# Patient Record
Sex: Male | Born: 1940 | Race: White | Hispanic: No | Smoking: Never smoker
Health system: Southern US, Community
[De-identification: ages and names within clinical notes are randomized; demographics above are authoritative.]

## PROBLEM LIST (undated history)

## (undated) DIAGNOSIS — L570 Actinic keratosis: Secondary | ICD-10-CM

## (undated) DIAGNOSIS — M1990 Unspecified osteoarthritis, unspecified site: Secondary | ICD-10-CM

## (undated) DIAGNOSIS — J301 Allergic rhinitis due to pollen: Secondary | ICD-10-CM

## (undated) DIAGNOSIS — H269 Unspecified cataract: Secondary | ICD-10-CM

## (undated) DIAGNOSIS — E079 Disorder of thyroid, unspecified: Secondary | ICD-10-CM

## (undated) DIAGNOSIS — E039 Hypothyroidism, unspecified: Secondary | ICD-10-CM

## (undated) DIAGNOSIS — E785 Hyperlipidemia, unspecified: Secondary | ICD-10-CM

## (undated) DIAGNOSIS — K219 Gastro-esophageal reflux disease without esophagitis: Secondary | ICD-10-CM

## (undated) DIAGNOSIS — T7840XA Allergy, unspecified, initial encounter: Secondary | ICD-10-CM

## (undated) DIAGNOSIS — L219 Seborrheic dermatitis, unspecified: Secondary | ICD-10-CM

## (undated) DIAGNOSIS — G709 Myoneural disorder, unspecified: Secondary | ICD-10-CM

## (undated) HISTORY — DX: Allergic rhinitis due to pollen: J30.1

## (undated) HISTORY — DX: Allergy, unspecified, initial encounter: T78.40XA

## (undated) HISTORY — DX: Unspecified osteoarthritis, unspecified site: M19.90

## (undated) HISTORY — DX: Seborrheic dermatitis, unspecified: L21.9

## (undated) HISTORY — DX: Gastro-esophageal reflux disease without esophagitis: K21.9

## (undated) HISTORY — DX: Hyperlipidemia, unspecified: E78.5

## (undated) HISTORY — DX: Actinic keratosis: L57.0

## (undated) HISTORY — PX: UPPER GI ENDOSCOPY: SHX6162

## (undated) HISTORY — PX: CHOLECYSTECTOMY: SHX55

## (undated) HISTORY — DX: Myoneural disorder, unspecified: G70.9

## (undated) HISTORY — PX: LUNG LOBECTOMY: SHX167

## (undated) HISTORY — PX: COLONOSCOPY: SHX174

## (undated) HISTORY — DX: Unspecified cataract: H26.9

## (undated) HISTORY — PX: EYE SURGERY: SHX253

## (undated) HISTORY — PX: KNEE SURGERY: SHX244

## (undated) HISTORY — DX: Disorder of thyroid, unspecified: E07.9

---
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
## 1966-09-01 HISTORY — PX: KNEE CARTILAGE SURGERY: SHX688

## 2007-09-02 DIAGNOSIS — C3490 Malignant neoplasm of unspecified part of unspecified bronchus or lung: Secondary | ICD-10-CM

## 2007-09-02 HISTORY — DX: Malignant neoplasm of unspecified part of unspecified bronchus or lung: C34.90

## 2009-10-23 LAB — HM COLONOSCOPY

## 2016-07-07 DIAGNOSIS — M898X9 Other specified disorders of bone, unspecified site: Secondary | ICD-10-CM | POA: Insufficient documentation

## 2016-09-30 DIAGNOSIS — E039 Hypothyroidism, unspecified: Secondary | ICD-10-CM | POA: Diagnosis not present

## 2016-09-30 DIAGNOSIS — Z125 Encounter for screening for malignant neoplasm of prostate: Secondary | ICD-10-CM | POA: Diagnosis not present

## 2016-09-30 DIAGNOSIS — E559 Vitamin D deficiency, unspecified: Secondary | ICD-10-CM | POA: Diagnosis not present

## 2016-09-30 DIAGNOSIS — E538 Deficiency of other specified B group vitamins: Secondary | ICD-10-CM | POA: Diagnosis not present

## 2016-10-07 DIAGNOSIS — E538 Deficiency of other specified B group vitamins: Secondary | ICD-10-CM | POA: Insufficient documentation

## 2016-10-07 DIAGNOSIS — N529 Male erectile dysfunction, unspecified: Secondary | ICD-10-CM | POA: Insufficient documentation

## 2016-10-07 DIAGNOSIS — M25562 Pain in left knee: Secondary | ICD-10-CM | POA: Insufficient documentation

## 2016-10-07 DIAGNOSIS — C3490 Malignant neoplasm of unspecified part of unspecified bronchus or lung: Secondary | ICD-10-CM | POA: Insufficient documentation

## 2016-10-08 DIAGNOSIS — N529 Male erectile dysfunction, unspecified: Secondary | ICD-10-CM | POA: Diagnosis not present

## 2016-10-08 DIAGNOSIS — H1010 Acute atopic conjunctivitis, unspecified eye: Secondary | ICD-10-CM | POA: Diagnosis not present

## 2016-10-08 DIAGNOSIS — K219 Gastro-esophageal reflux disease without esophagitis: Secondary | ICD-10-CM | POA: Diagnosis not present

## 2016-10-08 DIAGNOSIS — E559 Vitamin D deficiency, unspecified: Secondary | ICD-10-CM | POA: Diagnosis not present

## 2016-10-08 DIAGNOSIS — C3490 Malignant neoplasm of unspecified part of unspecified bronchus or lung: Secondary | ICD-10-CM | POA: Diagnosis not present

## 2016-10-08 DIAGNOSIS — E039 Hypothyroidism, unspecified: Secondary | ICD-10-CM | POA: Diagnosis not present

## 2016-10-08 DIAGNOSIS — H1013 Acute atopic conjunctivitis, bilateral: Secondary | ICD-10-CM | POA: Diagnosis not present

## 2016-10-08 DIAGNOSIS — R7989 Other specified abnormal findings of blood chemistry: Secondary | ICD-10-CM | POA: Diagnosis not present

## 2016-10-08 DIAGNOSIS — M25562 Pain in left knee: Secondary | ICD-10-CM | POA: Diagnosis not present

## 2016-10-08 DIAGNOSIS — Z0000 Encounter for general adult medical examination without abnormal findings: Secondary | ICD-10-CM | POA: Diagnosis not present

## 2016-10-08 DIAGNOSIS — R978 Other abnormal tumor markers: Secondary | ICD-10-CM | POA: Diagnosis not present

## 2016-11-07 DIAGNOSIS — G608 Other hereditary and idiopathic neuropathies: Secondary | ICD-10-CM | POA: Diagnosis not present

## 2016-11-07 DIAGNOSIS — R202 Paresthesia of skin: Secondary | ICD-10-CM | POA: Diagnosis not present

## 2016-12-29 DIAGNOSIS — M170 Bilateral primary osteoarthritis of knee: Secondary | ICD-10-CM | POA: Diagnosis not present

## 2016-12-31 DIAGNOSIS — M170 Bilateral primary osteoarthritis of knee: Secondary | ICD-10-CM | POA: Diagnosis not present

## 2017-01-17 DIAGNOSIS — H109 Unspecified conjunctivitis: Secondary | ICD-10-CM | POA: Diagnosis not present

## 2017-01-19 DIAGNOSIS — H25041 Posterior subcapsular polar age-related cataract, right eye: Secondary | ICD-10-CM | POA: Diagnosis not present

## 2017-01-19 DIAGNOSIS — H25013 Cortical age-related cataract, bilateral: Secondary | ICD-10-CM | POA: Diagnosis not present

## 2017-01-19 DIAGNOSIS — B303 Acute epidemic hemorrhagic conjunctivitis (enteroviral): Secondary | ICD-10-CM | POA: Diagnosis not present

## 2017-01-19 DIAGNOSIS — H2513 Age-related nuclear cataract, bilateral: Secondary | ICD-10-CM | POA: Diagnosis not present

## 2017-01-19 DIAGNOSIS — M170 Bilateral primary osteoarthritis of knee: Secondary | ICD-10-CM | POA: Diagnosis not present

## 2017-01-21 DIAGNOSIS — M170 Bilateral primary osteoarthritis of knee: Secondary | ICD-10-CM | POA: Diagnosis not present

## 2017-01-23 DIAGNOSIS — M170 Bilateral primary osteoarthritis of knee: Secondary | ICD-10-CM | POA: Diagnosis not present

## 2017-01-28 DIAGNOSIS — M170 Bilateral primary osteoarthritis of knee: Secondary | ICD-10-CM | POA: Diagnosis not present

## 2017-01-30 DIAGNOSIS — M170 Bilateral primary osteoarthritis of knee: Secondary | ICD-10-CM | POA: Diagnosis not present

## 2017-02-03 DIAGNOSIS — M170 Bilateral primary osteoarthritis of knee: Secondary | ICD-10-CM | POA: Diagnosis not present

## 2017-03-19 DIAGNOSIS — D2339 Other benign neoplasm of skin of other parts of face: Secondary | ICD-10-CM | POA: Diagnosis not present

## 2017-03-19 DIAGNOSIS — D1801 Hemangioma of skin and subcutaneous tissue: Secondary | ICD-10-CM | POA: Diagnosis not present

## 2017-03-19 DIAGNOSIS — L820 Inflamed seborrheic keratosis: Secondary | ICD-10-CM | POA: Diagnosis not present

## 2017-03-19 DIAGNOSIS — L570 Actinic keratosis: Secondary | ICD-10-CM | POA: Diagnosis not present

## 2017-03-19 DIAGNOSIS — L853 Xerosis cutis: Secondary | ICD-10-CM | POA: Diagnosis not present

## 2017-03-19 DIAGNOSIS — L821 Other seborrheic keratosis: Secondary | ICD-10-CM | POA: Diagnosis not present

## 2017-04-02 DIAGNOSIS — D2339 Other benign neoplasm of skin of other parts of face: Secondary | ICD-10-CM | POA: Diagnosis not present

## 2017-04-02 DIAGNOSIS — L820 Inflamed seborrheic keratosis: Secondary | ICD-10-CM | POA: Diagnosis not present

## 2017-04-02 DIAGNOSIS — D1801 Hemangioma of skin and subcutaneous tissue: Secondary | ICD-10-CM | POA: Diagnosis not present

## 2017-04-02 DIAGNOSIS — L821 Other seborrheic keratosis: Secondary | ICD-10-CM | POA: Diagnosis not present

## 2017-04-17 DIAGNOSIS — M7742 Metatarsalgia, left foot: Secondary | ICD-10-CM | POA: Diagnosis not present

## 2017-04-17 DIAGNOSIS — M79672 Pain in left foot: Secondary | ICD-10-CM | POA: Diagnosis not present

## 2017-04-17 DIAGNOSIS — M7752 Other enthesopathy of left foot: Secondary | ICD-10-CM | POA: Diagnosis not present

## 2017-04-17 DIAGNOSIS — R262 Difficulty in walking, not elsewhere classified: Secondary | ICD-10-CM | POA: Diagnosis not present

## 2017-05-19 DIAGNOSIS — L821 Other seborrheic keratosis: Secondary | ICD-10-CM | POA: Diagnosis not present

## 2017-05-19 DIAGNOSIS — L237 Allergic contact dermatitis due to plants, except food: Secondary | ICD-10-CM | POA: Diagnosis not present

## 2017-05-19 DIAGNOSIS — L570 Actinic keratosis: Secondary | ICD-10-CM | POA: Diagnosis not present

## 2017-05-19 DIAGNOSIS — D1801 Hemangioma of skin and subcutaneous tissue: Secondary | ICD-10-CM | POA: Diagnosis not present

## 2017-05-21 DIAGNOSIS — Z23 Encounter for immunization: Secondary | ICD-10-CM | POA: Diagnosis not present

## 2017-06-16 DIAGNOSIS — M79671 Pain in right foot: Secondary | ICD-10-CM | POA: Diagnosis not present

## 2017-06-16 DIAGNOSIS — M7741 Metatarsalgia, right foot: Secondary | ICD-10-CM | POA: Diagnosis not present

## 2017-06-29 DIAGNOSIS — R05 Cough: Secondary | ICD-10-CM | POA: Diagnosis not present

## 2017-06-30 DIAGNOSIS — J209 Acute bronchitis, unspecified: Secondary | ICD-10-CM | POA: Diagnosis not present

## 2017-07-20 DIAGNOSIS — H25013 Cortical age-related cataract, bilateral: Secondary | ICD-10-CM | POA: Diagnosis not present

## 2017-07-20 DIAGNOSIS — H25041 Posterior subcapsular polar age-related cataract, right eye: Secondary | ICD-10-CM | POA: Diagnosis not present

## 2017-07-20 DIAGNOSIS — H10013 Acute follicular conjunctivitis, bilateral: Secondary | ICD-10-CM | POA: Diagnosis not present

## 2017-07-20 DIAGNOSIS — H2513 Age-related nuclear cataract, bilateral: Secondary | ICD-10-CM | POA: Diagnosis not present

## 2017-08-17 DIAGNOSIS — H33322 Round hole, left eye: Secondary | ICD-10-CM | POA: Diagnosis not present

## 2017-08-17 DIAGNOSIS — H10013 Acute follicular conjunctivitis, bilateral: Secondary | ICD-10-CM | POA: Diagnosis not present

## 2017-08-17 DIAGNOSIS — H2513 Age-related nuclear cataract, bilateral: Secondary | ICD-10-CM | POA: Diagnosis not present

## 2017-08-17 DIAGNOSIS — H25013 Cortical age-related cataract, bilateral: Secondary | ICD-10-CM | POA: Diagnosis not present

## 2017-09-04 DIAGNOSIS — J069 Acute upper respiratory infection, unspecified: Secondary | ICD-10-CM | POA: Diagnosis not present

## 2017-09-12 DIAGNOSIS — R05 Cough: Secondary | ICD-10-CM | POA: Diagnosis not present

## 2017-09-27 DIAGNOSIS — R0609 Other forms of dyspnea: Secondary | ICD-10-CM | POA: Diagnosis not present

## 2017-09-27 DIAGNOSIS — J209 Acute bronchitis, unspecified: Secondary | ICD-10-CM | POA: Diagnosis not present

## 2017-10-02 DIAGNOSIS — E559 Vitamin D deficiency, unspecified: Secondary | ICD-10-CM | POA: Diagnosis not present

## 2017-10-02 DIAGNOSIS — R7989 Other specified abnormal findings of blood chemistry: Secondary | ICD-10-CM | POA: Diagnosis not present

## 2017-10-02 DIAGNOSIS — E039 Hypothyroidism, unspecified: Secondary | ICD-10-CM | POA: Diagnosis not present

## 2017-10-02 DIAGNOSIS — R972 Elevated prostate specific antigen [PSA]: Secondary | ICD-10-CM | POA: Diagnosis not present

## 2017-10-09 DIAGNOSIS — Z79899 Other long term (current) drug therapy: Secondary | ICD-10-CM | POA: Diagnosis not present

## 2017-10-09 DIAGNOSIS — K219 Gastro-esophageal reflux disease without esophagitis: Secondary | ICD-10-CM | POA: Diagnosis not present

## 2017-10-09 DIAGNOSIS — Z1211 Encounter for screening for malignant neoplasm of colon: Secondary | ICD-10-CM | POA: Diagnosis not present

## 2017-10-09 DIAGNOSIS — Z85118 Personal history of other malignant neoplasm of bronchus and lung: Secondary | ICD-10-CM | POA: Diagnosis not present

## 2017-10-09 DIAGNOSIS — H5213 Myopia, bilateral: Secondary | ICD-10-CM | POA: Diagnosis not present

## 2017-10-09 DIAGNOSIS — E039 Hypothyroidism, unspecified: Secondary | ICD-10-CM | POA: Diagnosis not present

## 2017-10-09 DIAGNOSIS — N529 Male erectile dysfunction, unspecified: Secondary | ICD-10-CM | POA: Diagnosis not present

## 2017-10-09 DIAGNOSIS — R7989 Other specified abnormal findings of blood chemistry: Secondary | ICD-10-CM | POA: Diagnosis not present

## 2017-10-09 DIAGNOSIS — C3490 Malignant neoplasm of unspecified part of unspecified bronchus or lung: Secondary | ICD-10-CM | POA: Diagnosis not present

## 2017-10-09 DIAGNOSIS — R978 Other abnormal tumor markers: Secondary | ICD-10-CM | POA: Diagnosis not present

## 2017-10-09 DIAGNOSIS — E559 Vitamin D deficiency, unspecified: Secondary | ICD-10-CM | POA: Diagnosis not present

## 2017-10-12 DIAGNOSIS — R102 Pelvic and perineal pain: Secondary | ICD-10-CM | POA: Diagnosis not present

## 2017-10-26 DIAGNOSIS — R918 Other nonspecific abnormal finding of lung field: Secondary | ICD-10-CM | POA: Diagnosis not present

## 2017-10-26 DIAGNOSIS — R05 Cough: Secondary | ICD-10-CM | POA: Diagnosis not present

## 2017-10-28 DIAGNOSIS — Z902 Acquired absence of lung [part of]: Secondary | ICD-10-CM | POA: Diagnosis not present

## 2017-10-28 DIAGNOSIS — J984 Other disorders of lung: Secondary | ICD-10-CM | POA: Diagnosis not present

## 2017-10-28 DIAGNOSIS — R918 Other nonspecific abnormal finding of lung field: Secondary | ICD-10-CM | POA: Diagnosis not present

## 2017-10-28 DIAGNOSIS — K7689 Other specified diseases of liver: Secondary | ICD-10-CM | POA: Diagnosis not present

## 2017-10-28 DIAGNOSIS — Z85118 Personal history of other malignant neoplasm of bronchus and lung: Secondary | ICD-10-CM | POA: Diagnosis not present

## 2017-10-30 DIAGNOSIS — J189 Pneumonia, unspecified organism: Secondary | ICD-10-CM | POA: Diagnosis not present

## 2017-11-11 DIAGNOSIS — J189 Pneumonia, unspecified organism: Secondary | ICD-10-CM | POA: Diagnosis not present

## 2017-11-19 DIAGNOSIS — D485 Neoplasm of uncertain behavior of skin: Secondary | ICD-10-CM | POA: Diagnosis not present

## 2017-11-19 DIAGNOSIS — L570 Actinic keratosis: Secondary | ICD-10-CM | POA: Diagnosis not present

## 2017-11-19 DIAGNOSIS — D487 Neoplasm of uncertain behavior of other specified sites: Secondary | ICD-10-CM | POA: Diagnosis not present

## 2017-11-19 DIAGNOSIS — L820 Inflamed seborrheic keratosis: Secondary | ICD-10-CM | POA: Diagnosis not present

## 2017-12-28 DIAGNOSIS — Z902 Acquired absence of lung [part of]: Secondary | ICD-10-CM | POA: Diagnosis not present

## 2017-12-28 DIAGNOSIS — J189 Pneumonia, unspecified organism: Secondary | ICD-10-CM | POA: Diagnosis not present

## 2017-12-28 DIAGNOSIS — R918 Other nonspecific abnormal finding of lung field: Secondary | ICD-10-CM | POA: Diagnosis not present

## 2018-01-01 DIAGNOSIS — S80862A Insect bite (nonvenomous), left lower leg, initial encounter: Secondary | ICD-10-CM | POA: Diagnosis not present

## 2018-01-01 DIAGNOSIS — L570 Actinic keratosis: Secondary | ICD-10-CM | POA: Diagnosis not present

## 2018-03-03 DIAGNOSIS — Z902 Acquired absence of lung [part of]: Secondary | ICD-10-CM | POA: Diagnosis not present

## 2018-03-03 DIAGNOSIS — Z79899 Other long term (current) drug therapy: Secondary | ICD-10-CM | POA: Diagnosis not present

## 2018-03-03 DIAGNOSIS — Z5181 Encounter for therapeutic drug level monitoring: Secondary | ICD-10-CM | POA: Diagnosis not present

## 2018-03-03 DIAGNOSIS — Z8709 Personal history of other diseases of the respiratory system: Secondary | ICD-10-CM | POA: Diagnosis not present

## 2018-03-03 DIAGNOSIS — R9389 Abnormal findings on diagnostic imaging of other specified body structures: Secondary | ICD-10-CM | POA: Diagnosis not present

## 2018-03-03 DIAGNOSIS — J189 Pneumonia, unspecified organism: Secondary | ICD-10-CM | POA: Diagnosis not present

## 2018-03-11 DIAGNOSIS — G608 Other hereditary and idiopathic neuropathies: Secondary | ICD-10-CM | POA: Diagnosis not present

## 2018-03-12 DIAGNOSIS — B351 Tinea unguium: Secondary | ICD-10-CM | POA: Diagnosis not present

## 2018-03-12 DIAGNOSIS — M79671 Pain in right foot: Secondary | ICD-10-CM | POA: Diagnosis not present

## 2018-03-12 DIAGNOSIS — M79672 Pain in left foot: Secondary | ICD-10-CM | POA: Diagnosis not present

## 2018-03-30 DIAGNOSIS — S30860A Insect bite (nonvenomous) of lower back and pelvis, initial encounter: Secondary | ICD-10-CM | POA: Diagnosis not present

## 2018-03-30 DIAGNOSIS — B351 Tinea unguium: Secondary | ICD-10-CM | POA: Diagnosis not present

## 2018-03-30 DIAGNOSIS — L853 Xerosis cutis: Secondary | ICD-10-CM | POA: Diagnosis not present

## 2018-03-30 DIAGNOSIS — L821 Other seborrheic keratosis: Secondary | ICD-10-CM | POA: Diagnosis not present

## 2018-03-30 DIAGNOSIS — D1801 Hemangioma of skin and subcutaneous tissue: Secondary | ICD-10-CM | POA: Diagnosis not present

## 2018-03-30 DIAGNOSIS — L820 Inflamed seborrheic keratosis: Secondary | ICD-10-CM | POA: Diagnosis not present

## 2018-04-23 DIAGNOSIS — Z23 Encounter for immunization: Secondary | ICD-10-CM | POA: Diagnosis not present

## 2018-04-26 DIAGNOSIS — B351 Tinea unguium: Secondary | ICD-10-CM | POA: Diagnosis not present

## 2018-04-26 DIAGNOSIS — M79672 Pain in left foot: Secondary | ICD-10-CM | POA: Diagnosis not present

## 2018-04-26 DIAGNOSIS — M25872 Other specified joint disorders, left ankle and foot: Secondary | ICD-10-CM | POA: Diagnosis not present

## 2018-05-17 ENCOUNTER — Telehealth: Payer: Self-pay | Admitting: Family Medicine

## 2018-05-17 ENCOUNTER — Encounter: Payer: Self-pay | Admitting: Family Medicine

## 2018-05-17 ENCOUNTER — Ambulatory Visit (INDEPENDENT_AMBULATORY_CARE_PROVIDER_SITE_OTHER): Payer: Medicare Other | Admitting: Family Medicine

## 2018-05-17 VITALS — BP 98/76 | HR 68 | Temp 99.1°F | Ht 70.5 in | Wt 177.0 lb

## 2018-05-17 DIAGNOSIS — E039 Hypothyroidism, unspecified: Secondary | ICD-10-CM | POA: Diagnosis not present

## 2018-05-17 DIAGNOSIS — M545 Low back pain: Secondary | ICD-10-CM | POA: Diagnosis not present

## 2018-05-17 DIAGNOSIS — Z902 Acquired absence of lung [part of]: Secondary | ICD-10-CM | POA: Diagnosis not present

## 2018-05-17 DIAGNOSIS — J302 Other seasonal allergic rhinitis: Secondary | ICD-10-CM

## 2018-05-17 DIAGNOSIS — G8929 Other chronic pain: Secondary | ICD-10-CM | POA: Diagnosis not present

## 2018-05-17 DIAGNOSIS — K219 Gastro-esophageal reflux disease without esophagitis: Secondary | ICD-10-CM

## 2018-05-17 NOTE — Telephone Encounter (Signed)
Pt would like to have fasting labs prior to Physical appt. Order is needed please and thank you!  Pt want to know if he's medicare eligible for a PSA test and if so does he need to have it done? Please advise?  Call pt @ [phone].

## 2018-05-17 NOTE — Progress Notes (Signed)
Subjective:    Patient ID: Kevin Terrell, male    DOB: July 24, 1941, 77 y.o.   MRN: [id number]  HPI  Presents to clinic to establish primary care with new PCP, he recently moved to area from [facility] with his wife.  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  Patient has hypothyroidism, stable on levothyroxine dose.  Patient has GERD, stable on omeprazole dose.  Patient has chronic seasonal allergies and takes cetirizine, montelukast daily and also uses Flonase as needed.  Patient has a history of lung cancer, had right upper lobectomy in 2009.  He followed with oncology afterwards for 6+ years and has been dismissed from oncology practice due to no more issues.  He is the main caregiver for his wife who suffers from Alzheimer's dementia.  Patient's past medical history, surgical history, social history, family history reviewed and updated in chart.  Patient last had lab work approximately 6 months ago, states he usually only gets lab work once per year.  Flu vaccine is up-to-date for 2019-2020 season  Patient Active Problem List   Diagnosis Date Noted  . Seasonal allergies 05/18/2018  . Hypothyroidism 05/18/2018  . Gastroesophageal reflux disease without esophagitis 05/18/2018  . 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 Chronic midline low back pain 05/18/2018   Social History   Tobacco Use  . Smoking status: Never Smoker  . Smokeless tobacco: Never Used  Substance Use Topics  . Alcohol use: Yes    Alcohol/week: 1.0 standard drinks    Types: 1 Glasses of wine per week    Comment: per once weekly    Family History  Problem Relation Age of Onset  . Cancer Mother   . Stroke Maternal Grandmother    Past Surgical History:  Procedure Laterality Date  . LUNG LOBECTOMY     upper right lung 12/15/2007    Review of Systems  Constitutional: Negative for chills, fatigue and fever.  HENT: Negative for congestion, ear  pain, sinus pain and sore throat.   Eyes: Negative.   Respiratory: Negative for cough, shortness of breath and wheezing.   Cardiovascular: Negative for chest pain, palpitations and leg swelling.  Gastrointestinal: Negative for abdominal pain, diarrhea, nausea and vomiting.  Genitourinary: Negative for dysuria, frequency and urgency.  Musculoskeletal: Negative for arthralgias and myalgias.  Skin: Negative for color change, pallor and rash.  Neurological: Negative for syncope, light-headedness and headaches.  Psychiatric/Behavioral: The patient is not nervous/anxious.       Objective:   Physical Exam  Constitutional: He is oriented to person, place, and time. He appears well-developed and well-nourished. No distress.   Head: Normocephalic and atraumatic.  Eyes: Pupils are equal, round, and reactive to light. Conjunctivae and EOM are normal. No scleral icterus.  Neck: Normal range of motion. Neck supple. No tracheal deviation present.  Cardiovascular: Normal rate, regular rhythm and normal heart sounds.  Pulmonary/Chest: Effort normal and breath sounds normal. No respiratory distress. He has no wheezes. He has no rales.  Neurological: He is alert and oriented to person, place, and time.  Gait normal  Skin: Skin is warm and dry. He is not diaphoretic. No pallor.  Psychiatric: He has a normal mood and affect. His behavior is normal. Thought content normal.   Nursing note and vitals reviewed.     Vitals:   05/17/18 1538  BP: 98/76  Pulse: 68  Temp: 99.1 F (37.3 C)  SpO2: 98%   Assessment &  Plan:   GERD-stable on current medications  Hypothyroid-stable on current dose  Seasonal allergies-stable on cetirizine and Singulair, uses Flonase as needed.  Chronic back pain -- Stable on gabapentin dose   Patient declines new lab work today, states he usually gets it was here or if he is having symptoms of illness.  Patient will return to clinic in approximately 4 months for annual  Medicare wellness and office visit for complete physical exam including lab work.  Patient advised he can return to clinic sooner if any issues or illness arise.

## 2018-05-18 ENCOUNTER — Encounter: Payer: Self-pay | Admitting: Family Medicine

## 2018-05-18 DIAGNOSIS — M545 Low back pain, unspecified: Secondary | ICD-10-CM | POA: Insufficient documentation

## 2018-05-18 DIAGNOSIS — K219 Gastro-esophageal reflux disease without esophagitis: Secondary | ICD-10-CM | POA: Insufficient documentation

## 2018-05-18 DIAGNOSIS — G8929 Other chronic pain: Secondary | ICD-10-CM | POA: Insufficient documentation

## 2018-05-18 DIAGNOSIS — J302 Other seasonal allergic rhinitis: Secondary | ICD-10-CM | POA: Insufficient documentation

## 2018-05-18 DIAGNOSIS — Z902 Acquired absence of lung [part of]: Secondary | ICD-10-CM | POA: Insufficient documentation

## 2018-05-18 DIAGNOSIS — E039 Hypothyroidism, unspecified: Secondary | ICD-10-CM | POA: Insufficient documentation

## 2018-05-18 NOTE — Telephone Encounter (Signed)
Sent to PCP to advise if OK with pt doing lab work prior to Physical appt.   If OK please place orders or advise me what orders you'd like to have done along with Dx code.   Thanks

## 2018-05-18 NOTE — Telephone Encounter (Signed)
Lab orders in   Please schedule lab appt about 1 week prior to CPE appt

## 2018-05-19 NOTE — Telephone Encounter (Signed)
Called pt to let him know about lab work that needs to be done at least 1 wk before visit. Pt will be calling back in January to schedule a lab appt. Pt also stated he found some old saline washes at home, he wanted to know if it was okay to keep and use if he needed to do a nasal rinse from time to time if he's congested or having sinus issues, could he do sinses and it would be okay

## 2018-05-19 NOTE — Telephone Encounter (Signed)
Yes saline washes are ok - just double check the expiration date on saline bottles prior to use  LG

## 2018-05-21 ENCOUNTER — Telehealth: Payer: Self-pay

## 2018-05-21 DIAGNOSIS — Z1283 Encounter for screening for malignant neoplasm of skin: Secondary | ICD-10-CM

## 2018-05-21 DIAGNOSIS — L602 Onychogryphosis: Secondary | ICD-10-CM

## 2018-05-21 NOTE — Addendum Note (Signed)
Addended by: Philis Nettle on: 05/21/2018 03:39 PM   Modules accepted: Orders

## 2018-05-21 NOTE — Telephone Encounter (Signed)
Copied from [place] [phone]. Topic: Referral - Request >> May 21, 2018  8:46 AM Hewitt Shorts wrote: Pt is requesting that Philis Nettle get him a referral to a clinical dermatologist and a podiatrist   Best number to call is [phone]

## 2018-05-26 ENCOUNTER — Telehealth: Payer: Self-pay | Admitting: Family Medicine

## 2018-05-26 NOTE — Telephone Encounter (Signed)
Pt dropped off a handicapp form to be completed. Form is up front in Lauren's color folder.

## 2018-05-28 ENCOUNTER — Telehealth: Payer: Self-pay

## 2018-05-28 NOTE — Telephone Encounter (Signed)
Paper work has been placed at the front desk. Voicemail was left for patient that paper work is completed and ready to be picked up.

## 2018-06-09 ENCOUNTER — Telehealth: Payer: Self-pay | Admitting: Family Medicine

## 2018-06-09 DIAGNOSIS — K219 Gastro-esophageal reflux disease without esophagitis: Secondary | ICD-10-CM

## 2018-06-09 DIAGNOSIS — M1990 Unspecified osteoarthritis, unspecified site: Secondary | ICD-10-CM

## 2018-06-09 DIAGNOSIS — Z902 Acquired absence of lung [part of]: Secondary | ICD-10-CM

## 2018-06-09 DIAGNOSIS — E039 Hypothyroidism, unspecified: Secondary | ICD-10-CM

## 2018-06-09 NOTE — Telephone Encounter (Signed)
Copied from [place] [id number] [id number]. Topic: Quick Communication - Rx Refill/Question >> Jun 09, 2018  4:58 PM Sheran Luz wrote: Medication: omeprazole (PRILOSEC) 40 MG capsule [[id number]], levothyroxine (SYNTHROID, LEVOTHROID) 50 MCG tablet [[id number]], montelukast (SINGULAIR) 10 MG tablet [[id number]]  and Meloxicam 15 mg.    Pt states he spoke with Philis Nettle on 9/16 (last Ov) about refilling any medications that he had previously been prescribed by provider in [place]. Pt states that he did not think to mention a couple of medications that he is needing refilled. Pt would like to know if she would initiate refills on the following medications, if at all possible.     Preferred Pharmacy (with phone number or street name): [id number] - [address], [place]  [phone] (Phone) [phone] (Fax)

## 2018-06-09 NOTE — Telephone Encounter (Signed)
Medication: omeprazole (PRILOSEC) 40 MG capsule [[id number]], levothyroxine (SYNTHROID, LEVOTHROID) 50 MCG tablet [[id number]], montelukast (SINGULAIR) 10 MG tablet [[id number]]  and Meloxicam 15 mg.    Pt states he spoke with Philis Nettle on 9/16 (last Ov) about refilling any medications that he had previously been prescribed by provider in [place]. Pt states that he did not think to mention a couple of medications that he is needing refilled. Pt would like to know if she would initiate refills on the following medications, if at all possible.     [id number] - [address], [place]   [phone] (Phone) [phone] (Fax)

## 2018-06-10 MED ORDER — LEVOTHYROXINE SODIUM 50 MCG PO TABS: 50.0000 ug | ORAL_TABLET | Freq: Every day | ORAL | 3 refills | Status: DC

## 2018-06-10 MED ORDER — MONTELUKAST SODIUM 10 MG PO TABS: 10.0000 mg | ORAL_TABLET | Freq: Every day | ORAL | 3 refills | Status: DC

## 2018-06-10 MED ORDER — MELOXICAM 15 MG PO TABS: 15.0000 mg | ORAL_TABLET | Freq: Every day | ORAL | 1 refills | Status: DC

## 2018-06-10 MED ORDER — OMEPRAZOLE 40 MG PO CPDR: 40.0000 mg | DELAYED_RELEASE_CAPSULE | Freq: Every day | ORAL | 3 refills | Status: DC

## 2018-06-10 NOTE — Telephone Encounter (Signed)
[facility] nurse spoke to Pt. Pt states he spoke with Philis Nettle on 9/16 (last Ov) about refilling any medications that he had previously been prescribed by provider in [place]. Pt states that he did not think to mention a couple of medications that he is needing refilled. Pt would like to know if she would initiate refills on the following medications, if at all possible.   Medication: omeprazole (PRILOSEC) 40 MG capsule [[id number]], levothyroxine (SYNTHROID, LEVOTHROID) 50 MCG tablet [[id number]], montelukast (SINGULAIR) 10 MG tablet [[id number]] and Meloxicam 15 mg.  [id number] - [address], [place] - [address] [phone] (Phone) [phone] (Fax)

## 2018-06-10 NOTE — Telephone Encounter (Signed)
Rx sent 

## 2018-06-10 NOTE — Telephone Encounter (Signed)
rx request 

## 2018-06-10 NOTE — Telephone Encounter (Signed)
Called Pt to tell him Rx was sent to the pharmacy

## 2018-06-30 ENCOUNTER — Other Ambulatory Visit: Payer: Self-pay | Admitting: Podiatry

## 2018-06-30 ENCOUNTER — Ambulatory Visit (INDEPENDENT_AMBULATORY_CARE_PROVIDER_SITE_OTHER): Payer: Medicare Other

## 2018-06-30 ENCOUNTER — Encounter: Payer: Self-pay | Admitting: Podiatry

## 2018-06-30 ENCOUNTER — Ambulatory Visit (INDEPENDENT_AMBULATORY_CARE_PROVIDER_SITE_OTHER): Payer: Medicare Other | Admitting: Podiatry

## 2018-06-30 DIAGNOSIS — B351 Tinea unguium: Secondary | ICD-10-CM | POA: Diagnosis not present

## 2018-06-30 DIAGNOSIS — M79676 Pain in unspecified toe(s): Secondary | ICD-10-CM

## 2018-06-30 DIAGNOSIS — M722 Plantar fascial fibromatosis: Secondary | ICD-10-CM

## 2018-06-30 DIAGNOSIS — M2580 Other specified joint disorders, unspecified joint: Secondary | ICD-10-CM

## 2018-06-30 NOTE — Progress Notes (Signed)
Subjective:  Patient ID: Kevin Terrell, male    DOB: March 05, 1941,  MRN: [id number] HPI Chief Complaint  Patient presents with  . Foot Pain    1st MPJ and plantar heel left - patient states him and his wife just moved to [facility] 4 weeks ago from [facility] and would like to get established with a podiatrist-seen one regularly for nail trims and sesamoiditis in left - used to be an active runner, tries to wear good shoes  . New Patient (Initial Visit)    77 y.o. male presents with the above complaint.   ROS: Denies fever chills nausea vomiting muscle aches pains calf pain back pain chest pain shortness of breath.  Past Medical History:  Diagnosis Date  . Arthritis   . GERD (gastroesophageal reflux disease)   . Thyroid disease    hypothyroid    Past Surgical History:  Procedure Laterality Date  . LUNG LOBECTOMY     upper right lung 12/15/2007     Current Outpatient Medications:  .  albuterol (VENTOLIN HFA) 108 (90 Base) MCG/ACT inhaler, Ventolin HFA 90 mcg/actuation aerosol inhaler, Disp: , Rfl:  .  cetirizine (ZYRTEC) 10 MG tablet, Take 10 mg by mouth daily., Disp: , Rfl:  .  fluticasone (VERAMYST) 27.5 MCG/SPRAY nasal spray, Place 2 sprays into the nose daily., Disp: , Rfl:  .  gabapentin (NEURONTIN) 300 MG capsule, Take 300 mg by mouth 2 (two) times daily., Disp: , Rfl:  .  levothyroxine (SYNTHROID, LEVOTHROID) 50 MCG tablet, Take 1 tablet (50 mcg total) by mouth daily before breakfast., Disp: 90 tablet, Rfl: 3 .  loperamide (IMODIUM) 2 MG capsule, loperamide 2 mg capsule, Disp: , Rfl:  .  meloxicam (MOBIC) 15 MG tablet, Take 1 tablet (15 mg total) by mouth daily., Disp: 90 tablet, Rfl: 1 .  montelukast (SINGULAIR) 10 MG tablet, Take 1 tablet (10 mg total) by mouth at bedtime., Disp: 90 tablet, Rfl: 3 .  Multiple Vitamin (MULTIVITAMIN WITH MINERALS) TABS tablet, Take 1 tablet by mouth daily., Disp: , Rfl:  .  nystatin cream (MYCOSTATIN), nystatin 100,000 unit/gram topical cream,  Disp: , Rfl:  .  Olopatadine HCl 0.2 % SOLN, olopatadine 0.2 % eye drops, Disp: , Rfl:  .  omeprazole (PRILOSEC) 40 MG capsule, Take 1 capsule (40 mg total) by mouth daily., Disp: 90 capsule, Rfl: 3 .  Probiotic Product (PROBIOTIC-10) CAPS, Take by mouth., Disp: , Rfl:  .  triamcinolone (KENALOG) 0.025 % ointment, triamcinolone acetonide 0.025 % topical ointment, Disp: , Rfl:  .  trimethoprim-polymyxin b (POLYTRIM) ophthalmic solution, polymyxin B sulfate 10,000 unit-trimethoprim 1 mg/mL eye drops, Disp: , Rfl:  .  Vitamin D, Ergocalciferol, (DRISDOL) 50000 units CAPS capsule, Take 50,000 Units by mouth every 7 (seven) days., Disp: , Rfl:   Allergies  Allergen Reactions  . Compazine [Prochlorperazine Edisylate]     muscle spasmus   . Other     Okra - hives   Review of Systems Objective:  There were no vitals filed for this visit.  General: Well developed, nourished, in no acute distress, alert and oriented x3   Dermatological: Skin is warm, dry and supple bilateral. Nails x 10 are well maintained; remaining integument appears unremarkable at this time. There are no open sores, no preulcerative lesions, no rash or signs of infection present.  Toenails are long thick yellow dystrophic and clinically mycotic  Vascular: Dorsalis Pedis artery and Posterior Tibial artery pedal pulses are 2/4 bilateral with immedate capillary fill time. Pedal  hair growth present. No varicosities and no lower extremity edema present bilateral.   Neruologic: Grossly intact via light touch bilateral. Vibratory intact via tuning fork bilateral. Protective threshold with Semmes Wienstein monofilament intact to all pedal sites bilateral. Patellar and Achilles deep tendon reflexes 2+ bilateral. No Babinski or clonus noted bilateral.   Musculoskeletal: No gross boney pedal deformities bilateral. No pain, crepitus, or limitation noted with foot and ankle range of motion bilateral. Muscular strength 5/5 in all groups  tested bilateral.  Pain on palpation with what appears to be a bursa of the tibial sesamoid left foot.  Also has mild plantar fasciitis  Gait: Unassisted, Nonantalgic.    Radiographs:  Radiographs demonstrate today no major osseous abnormalities no acute findings.  Assessment & Plan:   Assessment: Sesamoiditis tibial sesamoid first metatarsophalangeal joint left mild plantar fasciitis bilateral and painful elongated toenails bilateral.  Pain in limb secondary to onychomycosis.  Plan: After sterile Betadine skin prep I injected 2 mg of dexamethasone local anesthetic to the tibial sesamoid and bursa that is present there left foot.  Also debrided toenails 1 through 5 bilateral.     Solymar Grace T. [place], [place]

## 2018-07-15 DIAGNOSIS — D1800 Hemangioma unspecified site: Secondary | ICD-10-CM | POA: Diagnosis not present

## 2018-07-15 DIAGNOSIS — L905 Scar conditions and fibrosis of skin: Secondary | ICD-10-CM | POA: Diagnosis not present

## 2018-07-15 DIAGNOSIS — L821 Other seborrheic keratosis: Secondary | ICD-10-CM | POA: Diagnosis not present

## 2018-07-15 DIAGNOSIS — D229 Melanocytic nevi, unspecified: Secondary | ICD-10-CM | POA: Diagnosis not present

## 2018-07-15 DIAGNOSIS — L820 Inflamed seborrheic keratosis: Secondary | ICD-10-CM | POA: Diagnosis not present

## 2018-07-15 DIAGNOSIS — L570 Actinic keratosis: Secondary | ICD-10-CM | POA: Diagnosis not present

## 2018-07-15 DIAGNOSIS — L814 Other melanin hyperpigmentation: Secondary | ICD-10-CM | POA: Diagnosis not present

## 2018-08-11 ENCOUNTER — Ambulatory Visit: Payer: Medicare Other | Admitting: Orthotics

## 2018-08-11 DIAGNOSIS — M2580 Other specified joint disorders, unspecified joint: Secondary | ICD-10-CM

## 2018-08-11 DIAGNOSIS — B351 Tinea unguium: Secondary | ICD-10-CM

## 2018-08-11 DIAGNOSIS — M79676 Pain in unspecified toe(s): Secondary | ICD-10-CM

## 2018-08-11 NOTE — Progress Notes (Signed)
Had good discussion with patient re footwear; he has decided to visit Melissa at [employer] to get her ideas on a shoe comparable to [place], which he has had good success in the past.  If he doesn't find anything there, he will return and get Apex [id number]

## 2018-08-17 DIAGNOSIS — Z79899 Other long term (current) drug therapy: Secondary | ICD-10-CM | POA: Diagnosis not present

## 2018-08-17 DIAGNOSIS — E559 Vitamin D deficiency, unspecified: Secondary | ICD-10-CM | POA: Diagnosis not present

## 2018-08-17 DIAGNOSIS — G629 Polyneuropathy, unspecified: Secondary | ICD-10-CM | POA: Diagnosis not present

## 2018-08-17 DIAGNOSIS — Z85118 Personal history of other malignant neoplasm of bronchus and lung: Secondary | ICD-10-CM | POA: Diagnosis not present

## 2018-08-22 DIAGNOSIS — Z85118 Personal history of other malignant neoplasm of bronchus and lung: Secondary | ICD-10-CM | POA: Insufficient documentation

## 2018-08-22 DIAGNOSIS — G629 Polyneuropathy, unspecified: Secondary | ICD-10-CM

## 2018-08-22 DIAGNOSIS — G620 Drug-induced polyneuropathy: Secondary | ICD-10-CM | POA: Insufficient documentation

## 2018-09-06 ENCOUNTER — Telehealth: Payer: Self-pay

## 2018-09-06 NOTE — Telephone Encounter (Signed)
Called Pt and told him that him and his wife does not need any lab work and he has an appt here on 2/24. Pt stated he understood with no questions.

## 2018-09-06 NOTE — Telephone Encounter (Signed)
If labs were done in December, then he does not need to get them done now. Keep appt as planned in february

## 2018-09-06 NOTE — Telephone Encounter (Signed)
Copied from [place] [id number]. Topic: General - Other >> Sep 06, 2018 11:43 AM Leward Quan A wrote: Reason for CRM: Patient called to say that he was seen by Dr Manuella Ghazi a Neurologist that did a full biochemical workup on him in December and would like to know if that can be sent. Stated that its ok if Ander Purpura Guse still want him to come in and do blood work at the lab he's ok with that too. Ph# [phone]

## 2018-09-09 ENCOUNTER — Other Ambulatory Visit: Payer: Self-pay | Admitting: Lab

## 2018-09-09 DIAGNOSIS — M1990 Unspecified osteoarthritis, unspecified site: Secondary | ICD-10-CM

## 2018-09-09 MED ORDER — MELOXICAM 15 MG PO TABS: 15.0000 mg | ORAL_TABLET | Freq: Every day | ORAL | 1 refills | Status: DC

## 2018-09-23 DIAGNOSIS — L309 Dermatitis, unspecified: Secondary | ICD-10-CM | POA: Diagnosis not present

## 2018-09-23 DIAGNOSIS — L570 Actinic keratosis: Secondary | ICD-10-CM | POA: Diagnosis not present

## 2018-09-27 ENCOUNTER — Ambulatory Visit: Payer: Medicare Other | Admitting: Podiatry

## 2018-09-29 ENCOUNTER — Telehealth: Payer: Self-pay | Admitting: Family Medicine

## 2018-09-29 ENCOUNTER — Encounter: Payer: Self-pay | Admitting: Podiatry

## 2018-09-29 ENCOUNTER — Ambulatory Visit (INDEPENDENT_AMBULATORY_CARE_PROVIDER_SITE_OTHER): Payer: Medicare Other | Admitting: Podiatry

## 2018-09-29 DIAGNOSIS — Z0000 Encounter for general adult medical examination without abnormal findings: Secondary | ICD-10-CM

## 2018-09-29 DIAGNOSIS — M25872 Other specified joint disorders, left ankle and foot: Secondary | ICD-10-CM

## 2018-09-29 DIAGNOSIS — B351 Tinea unguium: Secondary | ICD-10-CM

## 2018-09-29 DIAGNOSIS — Z125 Encounter for screening for malignant neoplasm of prostate: Secondary | ICD-10-CM

## 2018-09-29 DIAGNOSIS — E039 Hypothyroidism, unspecified: Secondary | ICD-10-CM

## 2018-09-29 DIAGNOSIS — M79676 Pain in unspecified toe(s): Secondary | ICD-10-CM | POA: Diagnosis not present

## 2018-09-29 DIAGNOSIS — M2580 Other specified joint disorders, unspecified joint: Secondary | ICD-10-CM

## 2018-09-29 NOTE — Progress Notes (Signed)
He presents today for routine nail debridement.  States that he may still have some sesamoiditis.  Objective: Vital signs are stable alert and oriented x3 has some mild tenderness on direct palpation of the proximal edge of the tibial sesamoid left.  Otherwise his toenails are long thick yellow dystrophic clinically mycotic.  Assessment: Sesamoiditis and pain in limb secondary to onychomycosis.  Plan: Injected triamcinolone with local anesthetic after sterile Betadine skin prep a total of 5 mg was injected plantar medial aspect of the first metatarsophalangeal joint.  And also debrided toenails 1 through 5 bilateral.

## 2018-09-29 NOTE — Telephone Encounter (Signed)
Pt would like to know if needs to have a PSA lab check? Pt states it was elevated within the last 2 years in the past. Last PSA 10/02/2017.  Pt would like to know if fasting lipid panel needs to be done? Pt had it done about 3 years ago in December.   Please advise? Thank you!

## 2018-09-30 NOTE — Telephone Encounter (Signed)
Fasting labs and PSA ordered  Please schedule lab appt for him

## 2018-09-30 NOTE — Telephone Encounter (Signed)
Called Pt and scheduled him a lab  appt for 10/04/2018 at 10:00am

## 2018-10-04 ENCOUNTER — Other Ambulatory Visit (INDEPENDENT_AMBULATORY_CARE_PROVIDER_SITE_OTHER): Payer: Medicare Other

## 2018-10-04 DIAGNOSIS — E039 Hypothyroidism, unspecified: Secondary | ICD-10-CM

## 2018-10-04 DIAGNOSIS — Z0000 Encounter for general adult medical examination without abnormal findings: Secondary | ICD-10-CM

## 2018-10-04 DIAGNOSIS — Z125 Encounter for screening for malignant neoplasm of prostate: Secondary | ICD-10-CM | POA: Diagnosis not present

## 2018-10-04 LAB — COMPREHENSIVE METABOLIC PANEL
ALT: 14 U/L (ref 0–53)
AST: 15 U/L (ref 0–37)
Albumin: 4.3 g/dL (ref 3.5–5.2)
Alkaline Phosphatase: 54 U/L (ref 39–117)
BUN: 24 mg/dL — ABNORMAL HIGH (ref 6–23)
CO2: 31 mEq/L (ref 19–32)
CREATININE: 0.75 mg/dL (ref 0.40–1.50)
Calcium: 9 mg/dL (ref 8.4–10.5)
Chloride: 101 mEq/L (ref 96–112)
GFR: 101.17 mL/min (ref >60.00)
Glucose, Bld: 92 mg/dL (ref 70–99)
Potassium: 4.6 mEq/L (ref 3.5–5.1)
Sodium: 136 mEq/L (ref 135–145)
Total Bilirubin: 0.3 mg/dL (ref 0.2–1.2)
Total Protein: 6.6 g/dL (ref 6.0–8.3)

## 2018-10-04 LAB — LIPID PANEL
Cholesterol: 175 mg/dL (ref 0–200)
HDL: 44.6 mg/dL (ref >39.00)
LDL Cholesterol: 103 mg/dL — ABNORMAL HIGH (ref 0–99)
NonHDL: 130.03
Total CHOL/HDL Ratio: 4
Triglycerides: 133 mg/dL (ref 0.0–149.0)
VLDL: 26.6 mg/dL (ref 0.0–40.0)

## 2018-10-04 LAB — CBC WITH DIFFERENTIAL/PLATELET
Basophils Absolute: 0 10*3/uL (ref 0.0–0.1)
Basophils Relative: 0.3 % (ref 0.0–3.0)
Eosinophils Absolute: 0.1 10*3/uL (ref 0.0–0.7)
Eosinophils Relative: 2 % (ref 0.0–5.0)
HCT: 40.7 % (ref 39.0–52.0)
HEMOGLOBIN: 13.8 g/dL (ref 13.0–17.0)
Lymphocytes Relative: 32.8 % (ref 12.0–46.0)
Lymphs Abs: 1.5 10*3/uL (ref 0.7–4.0)
MCHC: 33.8 g/dL (ref 30.0–36.0)
MCV: 92 fl (ref 78.0–100.0)
Monocytes Absolute: 0.6 10*3/uL (ref 0.1–1.0)
Monocytes Relative: 13.2 % — ABNORMAL HIGH (ref 3.0–12.0)
Neutro Abs: 2.4 10*3/uL (ref 1.4–7.7)
Neutrophils Relative %: 51.7 % (ref 43.0–77.0)
Platelets: 158 10*3/uL (ref 150.0–400.0)
RBC: 4.43 Mil/uL (ref 4.22–5.81)
RDW: 13 % (ref 11.5–15.5)
WBC: 4.6 10*3/uL (ref 4.0–10.5)

## 2018-10-04 LAB — PSA, MEDICARE: PSA: 0.67 ng/ml (ref 0.10–4.00)

## 2018-10-05 LAB — THYROID PANEL WITH TSH
Free Thyroxine Index: 2.5 (ref 1.4–3.8)
T3 Uptake: 34 % (ref 22–35)
T4 TOTAL: 7.3 ug/dL (ref 4.9–10.5)
TSH: 1.25 mIU/L (ref 0.40–4.50)

## 2018-10-25 ENCOUNTER — Encounter: Payer: Medicare Other | Admitting: Family Medicine

## 2018-10-25 ENCOUNTER — Ambulatory Visit: Payer: Medicare Other

## 2018-10-26 ENCOUNTER — Encounter: Payer: Self-pay | Admitting: Family Medicine

## 2018-10-26 ENCOUNTER — Ambulatory Visit (INDEPENDENT_AMBULATORY_CARE_PROVIDER_SITE_OTHER): Payer: Medicare Other | Admitting: Family Medicine

## 2018-10-26 ENCOUNTER — Ambulatory Visit (INDEPENDENT_AMBULATORY_CARE_PROVIDER_SITE_OTHER): Payer: Medicare Other

## 2018-10-26 VITALS — BP 110/76 | HR 70 | Temp 98.0°F | Resp 16 | Ht 73.0 in | Wt 182.4 lb

## 2018-10-26 DIAGNOSIS — Z0000 Encounter for general adult medical examination without abnormal findings: Secondary | ICD-10-CM | POA: Diagnosis not present

## 2018-10-26 DIAGNOSIS — H359 Unspecified retinal disorder: Secondary | ICD-10-CM

## 2018-10-26 NOTE — Progress Notes (Signed)
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   MRN: [id number]  HPI   Patient presents to clinic for complete physical exam.  Patient had lab work a few weeks ago prior to CPE, labs are reviewed with patient and all are acceptable.  Currently he has no complaints.  He is up-to-date on his vaccinations other than tetanus.  He plans to get a tetanus for both himself and his wife sometime in the next few weeks at the pharmacy.  Patient is requesting a referral to ophthalmologist due to a history of a partial retinal tear, and flashing lights.  He has not had issues with flashing lights in the past 2 years however would like ophthalmologist for continued evaluation and management for future.  Last colonoscopy was a little over 10 years ago, patient would like to hold off on getting another one at this time.  If he decides he would like to proceed forward with an evaluation for colonoscopy, he is aware he can let us know we can place GI referral for him.  Patient Active Problem List   Diagnosis Date Noted  . Hx of cancer of lung 08/22/2018  . 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 Seasonal allergies 05/18/2018  . Hypothyroidism 05/18/2018  . Gastroesophageal reflux disease without esophagitis 05/18/2018  . 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 Chronic midline low back pain 05/18/2018  . Bone pain 07/07/2016   Social History   Tobacco Use  . Smoking status: Never Smoker  . Smokeless tobacco: Never Used  Substance Use Topics  . Alcohol use: Yes    Alcohol/week: 1.0 standard drinks    Types: 1 Glasses of wine per week    Comment: per once weekly    Past Surgical History:  Procedure Laterality Date  . LUNG LOBECTOMY     upper right lung 12/15/2007    Family History  Problem Relation Age of Onset  . Cancer Mother   . Stroke Maternal Grandmother    Review of Systems  Constitutional: Negative for chills, fatigue and fever.    HENT: Negative for congestion, ear pain, sinus pain and sore throat.   Eyes: Negative.   Respiratory: Negative for cough, shortness of breath and wheezing.   Cardiovascular: Negative for chest pain, palpitations and leg swelling.  Gastrointestinal: Negative for abdominal pain, diarrhea, nausea and vomiting.  Genitourinary: Negative for dysuria, frequency and urgency.  Musculoskeletal: Negative for arthralgias and myalgias.  Skin: Negative for color change, pallor and rash.  Neurological: Negative for syncope, light-headedness and headaches.  Psychiatric/Behavioral: The patient is not nervous/anxious.       Objective:   Physical Exam Physical Exam  Constitutional: He is oriented to person, place, and time. He appears well-developed and well-nourished. No distress.  HENT:  Head: Normocephalic and atraumatic.  Eyes: Pupils are equal, round, and reactive to light. Conjunctivae and EOM are normal. No scleral icterus.  Neck: Normal range of motion. Neck supple. No tracheal deviation present.  Cardiovascular: Normal rate, regular rhythm and normal heart sounds.  Pulmonary/Chest: Effort normal and breath sounds normal. No respiratory distress. He has no wheezes. He has no rales.  Abdominal: Soft. Bowel sounds are normal. There is no tenderness.  Declines GU exam Neurological: He is alert and oriented to person, place, and time.  Gait normal  Skin: Skin is warm and dry. He is not diaphoretic. No pallor.  Psychiatric: He has a normal mood and affect. His  behavior is normal. Thought content normal.   Nursing note and vitals reviewed.    Vitals:   10/26/18 0823  BP: 110/76  Pulse: 70  Resp: 16  Temp: 98 F (36.7 C)  SpO2: 98%       Assessment & Plan:   Well adult exam-patient is a healthy 78 year old male.  Vaccines are up-to-date.  EKG performed in clinic as part of annual physical screening, EKG reviewed by me and appears unremarkable for any acute abnormalities.  Discussed  healthy diet and regular physical activity.  Recommended a diet full of lean proteins, lots of vegetables, plenty of water and discussed daily walking and weight training with small weights.  Patient is very conscious of his health, wants to be as healthy as he can to begin either for his wife who has Alzheimer's dementia.  Ophthalmology referral placed per patient request due to his history of a retinal issue.  Patient will return to clinic annually for wellness exam.  He is aware he return to clinic sooner if any issues arise.

## 2018-10-26 NOTE — Progress Notes (Signed)
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  Review of Systems  No ROS.  Medicare Wellness Visit. Additional risk factors are reflected in the social history. Cardiac Risk Factors include: advanced age (>47men, >81 women);male gender    Objective:    Today's Vitals   10/26/18 0943  BP: 110/76  Pulse: 70  Resp: 16  Temp: 98 F (36.7 C)  TempSrc: Oral  SpO2: 98%  Weight: 182 lb 6.4 oz (82.7 kg)  Height: 6\' 1"  (1.854 m)   Body mass index is 24.06 kg/m.  Advanced Directives 10/26/2018  Does Patient Have a Medical Advance Directive? Yes  Type of Paramedic of [place];Living will;Out of facility DNR (pink MOST or yellow form)  Does patient want to make changes to medical advance directive? No - Patient declined  Copy of [place] in Chart? Yes - validated most recent copy scanned in chart (See row information)    Current Medications (verified) Outpatient Encounter Medications as of 10/26/2018  Medication Sig  . albuterol (VENTOLIN HFA) 108 (90 Base) MCG/ACT inhaler Ventolin HFA 90 mcg/actuation aerosol inhaler  . cetirizine (ZYRTEC) 10 MG tablet Take 10 mg by mouth daily.  . fluticasone (VERAMYST) 27.5 MCG/SPRAY nasal spray Place 2 sprays into the nose daily.  Marland Kitchen gabapentin (NEURONTIN) 300 MG capsule Take 300 mg by mouth 2 (two) times daily.  Marland Kitchen levothyroxine (SYNTHROID, LEVOTHROID) 50 MCG tablet Take 1 tablet (50 mcg total) by mouth daily before breakfast.  . loperamide (IMODIUM) 2 MG capsule loperamide 2 mg capsule  . meloxicam (MOBIC) 15 MG tablet Take 1 tablet (15 mg total) by mouth daily.  . montelukast (SINGULAIR) 10 MG tablet Take 1 tablet (10 mg total) by mouth at bedtime.  . Multiple Vitamin (MULTIVITAMIN WITH MINERALS) TABS tablet Take 1 tablet by mouth daily.  Marland Kitchen nystatin cream (MYCOSTATIN) nystatin 100,000 unit/gram topical cream  . Olopatadine HCl 0.2 %  SOLN olopatadine 0.2 % eye drops  . omeprazole (PRILOSEC) 40 MG capsule Take 1 capsule (40 mg total) by mouth daily.  . Probiotic Product (PROBIOTIC-10) CAPS Take by mouth.  . triamcinolone (KENALOG) 0.025 % ointment triamcinolone acetonide 0.025 % topical ointment  . trimethoprim-polymyxin b (POLYTRIM) ophthalmic solution polymyxin B sulfate 10,000 unit-trimethoprim 1 mg/mL eye drops  . Vitamin D, Ergocalciferol, (DRISDOL) 50000 units CAPS capsule Take 50,000 Units by mouth every 7 (seven) days.   No facility-administered encounter medications on file as of 10/26/2018.     Allergies (verified) Compazine [prochlorperazine edisylate] and Other   History: Past Medical History:  Diagnosis Date  . Arthritis   . GERD (gastroesophageal reflux disease)   . Thyroid disease    hypothyroid    Past Surgical History:  Procedure Laterality Date  . LUNG LOBECTOMY     upper right lung 12/15/2007    Family History  Problem Relation Age of Onset  . Cancer Mother   . Stroke Maternal Grandmother    Social History   Socioeconomic History  . Marital status: Married    Spouse name: Not on file  . Number of children: Not on file  . Years of education: Not on file  . Highest education level: Not on file  Occupational History  . Not on file  Social Needs  . Financial resource strain: Not hard at all  . Food insecurity:    Worry: Never true    Inability: Never true  .  Transportation needs:    Medical: No    Non-medical: No  Tobacco Use  . Smoking status: Never Smoker  . Smokeless tobacco: Never Used  Substance and Sexual Activity  . Alcohol use: Yes    Alcohol/week: 1.0 standard drinks    Types: 1 Glasses of wine per week    Comment: per once weekly   . Drug use: Not on file  . Sexual activity: Yes  Lifestyle  . Physical activity:    Days per week: 0 days    Minutes per session: Not on file  . Stress: Not at all  Relationships  . Social connections:    Talks on phone: Not on  file    Gets together: Not on file    Attends religious service: Not on file    Active member of club or organization: Not on file    Attends meetings of clubs or organizations: Not on file    Relationship status: Not on file  Other Topics Concern  . Not on file  Social History Narrative  . Not on file   Tobacco Counseling Counseling given: Not Answered   Clinical Intake:  Pre-visit preparation completed: Yes        Diabetes: No  How often do you need to have someone help you when you read instructions, pamphlets, or other written materials from your doctor or pharmacy?: 1 - Never  Interpreter Needed?: No     Activities of Daily Living In your present state of health, do you have any difficulty performing the following activities: 10/26/2018  Hearing? N  Vision? N  Difficulty concentrating or making decisions? N  Walking or climbing stairs? N  Dressing or bathing? N  Doing errands, shopping? N  Preparing Food and eating ? N  Using the Toilet? N  In the past six months, have you accidently leaked urine? N  Do you have problems with loss of bowel control? N  Managing your Medications? N  Managing your Finances? N  Housekeeping or managing your Housekeeping? N     Immunizations and Health Maintenance Immunization History  Administered Date(s) Administered  . Influenza-Unspecified 05/11/2018  . Zoster Recombinat (Shingrix) 05/06/2018, 07/23/2018   Health Maintenance Due  Topic Date Due  . TETANUS/TDAP  06/14/1961  . PNA vac Low Risk Adult (1 of 2 - PCV13) 06/15/2007    Patient Care Team: Jodelle Green, FNP as PCP - General (Family Medicine)  Indicate any recent Medical Services you may have received from other than [facility] providers in the past year (date may be approximate).    Assessment:   This is a routine wellness examination for [name].  Health Screenings  PSA 10/04/18 (0.67) TSH- 10/05/18 Glaucoma -none Hearing -demonstrates normal  hearing Cholesterol -10/04/18 (175) Dental- every 6 months Vision- annual visits  Social  Alcohol intake yes, 1 per month or less Smoking history- none Smokers in home? none Illicit drug use? none Exercise -none Diet -regular Sexually Active -yes Multiple Partners -no  Safety  Patient feesl safe at home.  Patient does have smoke detectors at home  Patient does wear sunscreen or protective clothing when in direct sunlight  Patient does wear seat belt when driving or riding with others.   Activities of Daily Living Patient can do their own household chores. Denies needing assistance with: driving, feeding themselves, getting from bed to chair, getting to the toilet, bathing/showering, dressing, managing money, climbing flight of stairs, or preparing meals.   Depression Screen Patient denies losing interest  in daily life, feeling hopeless, or crying easily over simple problems.   Fall Screen Patient denies being afraid of falling or falling in the last year.   Memory Screen Patient denies problems with memory, misplacing items, and is able to balance checkbook/bank accounts.  Patient is alert, normal appearance, oriented to person/place/and time. Correctly identified the president of the [place], recall of 3/3 objects, and performing simple calculations.  Patient displays appropriate judgement and can read correct time from watch face.   Immunizations The following Immunizations are up to date: Influenza, shingles. Discussed pneumonia, and tetanus.   Other Providers Patient Care Team: Jodelle Green, FNP as PCP - General (Family Medicine)  Hearing/Vision screen  Visual Acuity Screening   Right eye Left eye Both eyes  Without correction:     With correction:   20/20  Hearing Screening Comments: Patient is able to hear conversational tones without difficulty.  No issues reported.   Dietary issues and exercise activities discussed: Current Exercise Habits: The patient does not  participate in regular exercise at present  Goals      Patient Stated   . Increase physical activity (pt-stated)     Low impact exercises (stationary bicycling, walking) 3 days weekly, 30-45 minutes Maintain weight      Depression Screen PHQ 2/9 Scores 10/26/2018 05/17/2018  PHQ - 2 Score 0 0    Fall Risk Fall Risk  10/26/2018 05/17/2018  Falls in the past year? 0 No   Cognitive Function: MMSE - Mini Mental State Exam 10/26/2018  Orientation to time 5  Orientation to Place 5  Registration 3  Attention/ Calculation 5  Recall 3  Language- name 2 objects 2  Language- repeat 1  Language- follow 3 step command 3  Language- read & follow direction 1  Write a sentence 1  Copy design 1  Total score 30        Screening Tests Health Maintenance  Topic Date Due  . TETANUS/TDAP  06/14/1961  . PNA vac Low Risk Adult (1 of 2 - PCV13) 06/15/2007  . INFLUENZA VACCINE  Completed      Plan:    End of life planning; Advance aging; Advanced directives discussed. Copy of current HCPOA/Living Will on file.    I have personally reviewed and noted the following in the patient's chart:   . Medical and social history . Use of alcohol, tobacco or illicit drugs  . Current medications and supplements . Functional ability and status . Nutritional status . Physical activity . Advanced directives . List of other physicians . Hospitalizations, surgeries, and ER visits in previous 12 months . Vitals . Screenings to include cognitive, depression, and falls . Referrals and appointments  In addition, I have reviewed and discussed with patient certain preventive protocols, quality metrics, and best practice recommendations. A written personalized care plan for preventive services as well as general preventive health recommendations were provided to patient.     Varney Biles, LPN   4/48/1856

## 2018-10-26 NOTE — Progress Notes (Signed)
LPN note reviewed

## 2018-10-26 NOTE — Patient Instructions (Signed)
  Kevin Terrell , Thank you for taking time to come for your Medicare Wellness Visit. I appreciate your ongoing commitment to your health goals. Please review the following plan we discussed and let me know if I can assist you in the future.   Schedule eye exam  These are the goals we discussed: Goals      Patient Stated   . Increase physical activity (pt-stated)     Low impact exercises (stationary bicycling, walking) 3 days weekly, 30-45 minutes Maintain weight       This is a list of the screening recommended for you and due dates:  Health Maintenance  Topic Date Due  . Tetanus Vaccine  06/14/1961  . Pneumonia vaccines (1 of 2 - PCV13) 06/15/2007  . Flu Shot  Completed

## 2018-10-29 ENCOUNTER — Telehealth: Payer: Self-pay | Admitting: Family Medicine

## 2018-10-29 NOTE — Telephone Encounter (Signed)
Copied from [place] [phone]. Topic: Quick Communication - See Telephone Encounter >> Oct 29, 2018  3:31 PM Blase Mess A wrote: CRM for notification. See Telephone encounter for: 10/29/18.  Patient is calling regarding a bill from [employer] Date of Service 10/25/18 Vitamin D test. Having it to be coded wrong Please advise [phone]

## 2018-11-04 NOTE — Telephone Encounter (Signed)
I left the patient a message to inform him that no vitamin D was drawn in this office for DOS 2.24.20

## 2018-12-29 ENCOUNTER — Ambulatory Visit: Payer: Medicare Other | Admitting: Podiatry

## 2019-01-18 DIAGNOSIS — G629 Polyneuropathy, unspecified: Secondary | ICD-10-CM | POA: Diagnosis not present

## 2019-01-18 DIAGNOSIS — Z85118 Personal history of other malignant neoplasm of bronchus and lung: Secondary | ICD-10-CM | POA: Diagnosis not present

## 2019-01-25 ENCOUNTER — Telehealth: Payer: Self-pay | Admitting: Family Medicine

## 2019-01-25 NOTE — Telephone Encounter (Signed)
Copied from [place] [id number]. Topic: Quick Communication - See Telephone Encounter >> Jan 25, 2019 12:15 PM Sheran Luz wrote: CRM for notification. See Telephone encounter for: 01/25/19.  Patient requesting call back from Philis Nettle, FNP to discuss medication management; possibly stopping the medication omeprazole (PRILOSEC) 40 MG capsule and to discuss the medication gabapentin (NEURONTIN) 300 MG capsule, as he has decreased his dosage. Patient also states that he would like advice on when to restart home health services (pt chose to discontinue due to covid-19- self isolation).   Lastly, patient would like to note that he received TDAP vaccination 10/26/2018.

## 2019-01-25 NOTE — Telephone Encounter (Signed)
Pt called Kevin Terrell requesting call back from Philis Nettle, FNP to discuss medication management; possibly stopping the medication omeprazole (PRILOSEC) 40 MG capsule and to discuss the medication gabapentin (NEURONTIN) 300 MG capsule, as he has decreased his dosage. Terrell also states that he would like advice on when to restart home health services (pt chose to discontinue due to covid-19- self isolation).   Lastly, Terrell would like to note that he received TDAP vaccination 10/26/2018.

## 2019-01-25 NOTE — Telephone Encounter (Signed)
Called Pt and scheduled him for Doxy visit 01/26/2019 @ 10:20 am

## 2019-01-25 NOTE — Telephone Encounter (Signed)
Can he do virtual visit or phone call visit tomorrow?  Questions in this message constitute at lease a phone call visit

## 2019-01-26 ENCOUNTER — Encounter: Payer: Self-pay | Admitting: Family Medicine

## 2019-01-26 ENCOUNTER — Other Ambulatory Visit: Payer: Self-pay

## 2019-01-26 ENCOUNTER — Ambulatory Visit: Payer: Medicare Other | Admitting: Podiatry

## 2019-01-26 ENCOUNTER — Ambulatory Visit (INDEPENDENT_AMBULATORY_CARE_PROVIDER_SITE_OTHER): Payer: Medicare Other | Admitting: Family Medicine

## 2019-01-26 DIAGNOSIS — K219 Gastro-esophageal reflux disease without esophagitis: Secondary | ICD-10-CM

## 2019-01-26 DIAGNOSIS — M1990 Unspecified osteoarthritis, unspecified site: Secondary | ICD-10-CM | POA: Diagnosis not present

## 2019-01-26 DIAGNOSIS — G629 Polyneuropathy, unspecified: Secondary | ICD-10-CM | POA: Diagnosis not present

## 2019-01-26 DIAGNOSIS — G608 Other hereditary and idiopathic neuropathies: Secondary | ICD-10-CM

## 2019-01-26 NOTE — Progress Notes (Signed)
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   MRN: [id number]    Virtual Visit via video Note  This visit type was conducted due to national recommendations for restrictions regarding the COVID-19 pandemic (e.g. social distancing).  This format is felt to be most appropriate for this patient at this time.  All issues noted in this document were discussed and addressed.  No physical exam was performed (except for noted visual exam findings with Video Visits).   I connected with Kevin Terrell today at 10:20 AM EDT by a video enabled telemedicine application and verified that I am speaking with the correct person using two identifiers. Location patient: home Location provider: [facility] Persons participating in the virtual visit: patient, provider  I discussed the limitations, risks, security and privacy concerns of performing an evaluation and management service by video and the availability of in person appointments. I also discussed with the patient that there may be a patient responsible charge related to this service. The patient expressed understanding and agreed to proceed.  HPI:  Patient and I connected via video to discuss his medications.  Patient has been on a PPI, either omeprazole or pantoprazole for the past 10 years.  Patient states he recently was started on this medication during his cancer treatments.  States the cancer treatments made him have very bad GERD, and the PPI worked well to keep it under control.  Patient has been doing some reading and soft that long-term PPI use could affect the kidneys over time.  Patient has been slowly weaning himself off of the PPI and has not noticed much breakthrough GERD.  Has to use a Pepcid AC on occasion with good success in treating heartburn symptoms.  Patient also uses gabapentin twice daily for control of neuropathy.  Neurologist did trial bumping it up to 600 mg twice daily, but patient states when he took  the higher dose he did feel some dizziness starting to creep in.  He reduced himself back down to 300 mg twice daily dose and is feeling much better.  300 mg twice daily dose does help control his neuropathy, and he would prefer to stay on this dose rather than take the higher dose and feel dizzy.  Patient also had previously been taking meloxicam every day for joint pain.  But he has backed off to only using as needed.  States if he has having some mild pain he will usually take a Tylenol and preserves the meloxicam for times when he knows he will be on his feet for extended hours.  He and his wife have been sheltering in place at their apartment in the [facility].  He is feeling well, denies chest pain, shortness of breath, fever chills, GI or GU problems.    ROS: See pertinent positives and negatives per HPI.  Past Medical History:  Diagnosis Date  . Arthritis   . GERD (gastroesophageal reflux disease)   . Thyroid disease    hypothyroid     Past Surgical History:  Procedure Laterality Date  . LUNG LOBECTOMY     upper right lung 12/15/2007     Family History  Problem Relation Age of Onset  . Cancer Mother   . Stroke Maternal Grandmother    Social History   Tobacco Use  . Smoking status: Never Smoker  . Smokeless tobacco: Never Used  Substance Use Topics  . Alcohol use: Yes    Alcohol/week: 1.0 standard drinks  Types: 1 Glasses of wine per week    Comment: per once weekly    Social History   Tobacco Use  . Smoking status: Never Smoker  . Smokeless tobacco: Never Used  Substance Use Topics  . Alcohol use: Yes    Alcohol/week: 1.0 standard drinks    Types: 1 Glasses of wine per week    Comment: per once weekly     Current Outpatient Medications:  .  albuterol (VENTOLIN HFA) 108 (90 Base) MCG/ACT inhaler, Ventolin HFA 90 mcg/actuation aerosol inhaler, Disp: , Rfl:  .  cetirizine (ZYRTEC) 10 MG tablet, Take 10 mg by mouth daily., Disp: , Rfl:   .  fluticasone (VERAMYST) 27.5 MCG/SPRAY nasal spray, Place 2 sprays into the nose daily., Disp: , Rfl:  .  gabapentin (NEURONTIN) 300 MG capsule, Take 300 mg by mouth 2 (two) times daily., Disp: , Rfl:  .  levothyroxine (SYNTHROID, LEVOTHROID) 50 MCG tablet, Take 1 tablet (50 mcg total) by mouth daily before breakfast., Disp: 90 tablet, Rfl: 3 .  loperamide (IMODIUM) 2 MG capsule, loperamide 2 mg capsule, Disp: , Rfl:  .  meloxicam (MOBIC) 15 MG tablet, Take 1 tablet (15 mg total) by mouth daily., Disp: 90 tablet, Rfl: 1 .  montelukast (SINGULAIR) 10 MG tablet, Take 1 tablet (10 mg total) by mouth at bedtime., Disp: 90 tablet, Rfl: 3 .  Multiple Vitamin (MULTIVITAMIN WITH MINERALS) TABS tablet, Take 1 tablet by mouth daily., Disp: , Rfl:  .  nystatin cream (MYCOSTATIN), nystatin 100,000 unit/gram topical cream, Disp: , Rfl:  .  Olopatadine HCl 0.2 % SOLN, olopatadine 0.2 % eye drops, Disp: , Rfl:  .  omeprazole (PRILOSEC) 40 MG capsule, Take 1 capsule (40 mg total) by mouth daily., Disp: 90 capsule, Rfl: 3 .  Probiotic Product (PROBIOTIC-10) CAPS, Take by mouth., Disp: , Rfl:  .  triamcinolone (KENALOG) 0.025 % ointment, triamcinolone acetonide 0.025 % topical ointment, Disp: , Rfl:  .  trimethoprim-polymyxin b (POLYTRIM) ophthalmic solution, polymyxin B sulfate 10,000 unit-trimethoprim 1 mg/mL eye drops, Disp: , Rfl:  .  Vitamin D, Ergocalciferol, (DRISDOL) 50000 units CAPS capsule, Take 50,000 Units by mouth every 7 (seven) days., Disp: , Rfl:   EXAM:  GENERAL: alert, oriented, appears well and in no acute distress  HEENT: atraumatic, conjunttiva clear, no obvious abnormalities on inspection of external nose and ears  NECK: normal movements of the head and neck  LUNGS: on inspection no signs of respiratory distress, breathing rate appears normal, no obvious gross SOB, gasping or wheezing  CV: no obvious cyanosis  MS: moves all visible extremities without noticeable abnormality   PSYCH/NEURO: pleasant and cooperative, no obvious depression or anxiety, speech and thought processing grossly intact  ASSESSMENT AND PLAN:  Discussed the following assessment and plan:  Gastroesophageal reflux disease without esophagitis  Osteoarthritis, unspecified osteoarthritis type, unspecified site  Sensory peripheral neuropathy  Patient advised that he does not have to take PPI every day.  He can use a Pepcid as needed or he can also use a Tums as needed.  Advised that if he knows he is going to be eating something that tends to set off his reflux such as spicy foods or tomato, he can prophylactically take a Pepcid.  Patient advised he also may use the meloxicam as needed.  Taking Tylenol when pain is mild is completely fine and then reserving the meloxicam when he knows he will be up and walking around is a great plan.  Advised that he  can continue to stay on the gabapentin 300 mg twice daily if it controls his neuropathy.  If he would want to trial bumping the dose up again in the future, we can keep with the 300 mg dose in the a.m., and make the p.m. dose 600 mg to help offset any dizziness side effect.   I discussed the assessment and treatment plan with the patient. The patient was provided an opportunity to ask questions and all were answered. The patient agreed with the plan and demonstrated an understanding of the instructions.   The patient was advised to call back or seek an in-person evaluation if the symptoms worsen or if the condition fails to improve as anticipated.  Patient will keep his regularly scheduled follow-up as planned.  Advised to return to clinic sooner if any issues arise.   Jodelle Green, FNP

## 2019-02-21 ENCOUNTER — Ambulatory Visit: Payer: Medicare Other | Admitting: Podiatry

## 2019-03-23 ENCOUNTER — Ambulatory Visit: Payer: Medicare Other | Admitting: Podiatry

## 2019-03-25 ENCOUNTER — Other Ambulatory Visit: Payer: Self-pay | Admitting: Lab

## 2019-03-25 MED ORDER — FLUTICASONE PROPIONATE 50 MCG/ACT NA SUSP: 2.0000 | Freq: Every day | NASAL | 6 refills | Status: DC

## 2019-03-25 NOTE — Telephone Encounter (Signed)
Kevin Terrell sent a Refill request for FLONASE 90 day supply  I looked in Pt chart and I saw Fluticasone from a [facility] provider

## 2019-03-31 ENCOUNTER — Other Ambulatory Visit: Payer: Self-pay | Admitting: Family Medicine

## 2019-03-31 DIAGNOSIS — M1990 Unspecified osteoarthritis, unspecified site: Secondary | ICD-10-CM

## 2019-03-31 DIAGNOSIS — Z902 Acquired absence of lung [part of]: Secondary | ICD-10-CM

## 2019-04-18 ENCOUNTER — Other Ambulatory Visit: Payer: Self-pay | Admitting: Family Medicine

## 2019-04-22 NOTE — Telephone Encounter (Signed)
err

## 2019-04-27 ENCOUNTER — Ambulatory Visit: Payer: Medicare Other | Admitting: Podiatry

## 2019-05-25 ENCOUNTER — Ambulatory Visit: Payer: Medicare Other | Admitting: Podiatry

## 2019-06-21 ENCOUNTER — Telehealth: Payer: Self-pay | Admitting: *Deleted

## 2019-06-21 DIAGNOSIS — Z1211 Encounter for screening for malignant neoplasm of colon: Secondary | ICD-10-CM

## 2019-06-21 NOTE — Telephone Encounter (Signed)
Called Pt and told him that a Cologuard order was placed and he could do it whenever he decides to. He stated okay and Thank You.

## 2019-06-21 NOTE — Telephone Encounter (Signed)
Copied from [place] [phone]. Topic: General - Inquiry >> Jun 21, 2019  3:22 PM Rutherford Nail, [place] wrote: Reason for CRM: Patient calling and states that his last colonoscopy was 10 years ago. States that he did a cologard 3 years ago and things came back normal. Would like to know if Lauren recommends that he do another cologard or to do a colonoscopy?

## 2019-06-21 NOTE — Telephone Encounter (Signed)
I think doing the cologuard again is fine  I have placed the order so he can do when ever is convenient for him

## 2019-06-21 NOTE — Telephone Encounter (Signed)
Pt called [facility] Reason for CRM: Patient calling and states that his last colonoscopy was 10 years ago. States that he did a cologard 3 years ago and things came back normal. Would like to know if Lauren recommends that he do another cologard or to do a colonoscopy?

## 2019-06-27 ENCOUNTER — Ambulatory Visit: Payer: Medicare Other | Admitting: Podiatry

## 2019-07-06 DIAGNOSIS — Z1211 Encounter for screening for malignant neoplasm of colon: Secondary | ICD-10-CM | POA: Diagnosis not present

## 2019-07-07 LAB — COLOGUARD: Cologuard: NEGATIVE

## 2019-07-12 ENCOUNTER — Telehealth: Payer: Self-pay | Admitting: *Deleted

## 2019-07-12 NOTE — Telephone Encounter (Signed)
Very sweet, thank you!  LG

## 2019-07-12 NOTE — Telephone Encounter (Signed)
Copied from [place] [phone]. Topic: General - Inquiry >> Jul 12, 2019  1:56 PM Alease Frame wrote: Reason for CRM: patient wanted to thank dr Chauncy Passy for her services over the years

## 2019-07-13 ENCOUNTER — Other Ambulatory Visit: Payer: Self-pay | Admitting: Family Medicine

## 2019-07-13 DIAGNOSIS — E039 Hypothyroidism, unspecified: Secondary | ICD-10-CM

## 2019-07-20 ENCOUNTER — Telehealth: Payer: Self-pay

## 2019-07-20 NOTE — Telephone Encounter (Signed)
Copied from [place] [phone]. Topic: General - Other >> Jul 20, 2019  3:18 PM Keene Breath wrote: Reason for CRM: Called to ask Santiago Glad to call him regarding a recommendation, since Philis Nettle will be leaving.  CB# [phone]

## 2019-08-02 ENCOUNTER — Telehealth: Payer: Self-pay

## 2019-08-02 NOTE — Telephone Encounter (Signed)
Copied from [place] [phone]. Topic: General - Other >> Aug 02, 2019  1:24 PM Celene Kras wrote: Reason for CRM: Pt called and is requesting to have his results back for his coliguard. Please advise.

## 2019-08-02 NOTE — Telephone Encounter (Signed)
Lmtcb. [facility] for [employer] to advise.  Nina,cma

## 2019-08-02 NOTE — Telephone Encounter (Signed)
Pt returned call and cologuard results given to him with verbal understanding.

## 2019-08-09 DIAGNOSIS — G608 Other hereditary and idiopathic neuropathies: Secondary | ICD-10-CM | POA: Diagnosis not present

## 2019-08-10 ENCOUNTER — Ambulatory Visit: Payer: Medicare Other | Admitting: Podiatry

## 2019-08-24 ENCOUNTER — Ambulatory Visit (INDEPENDENT_AMBULATORY_CARE_PROVIDER_SITE_OTHER): Payer: Medicare Other | Admitting: Family

## 2019-08-24 ENCOUNTER — Encounter: Payer: Self-pay | Admitting: Family

## 2019-08-24 ENCOUNTER — Other Ambulatory Visit: Payer: Self-pay

## 2019-08-24 DIAGNOSIS — E559 Vitamin D deficiency, unspecified: Secondary | ICD-10-CM | POA: Insufficient documentation

## 2019-08-24 DIAGNOSIS — E039 Hypothyroidism, unspecified: Secondary | ICD-10-CM | POA: Diagnosis not present

## 2019-08-24 DIAGNOSIS — M1990 Unspecified osteoarthritis, unspecified site: Secondary | ICD-10-CM | POA: Diagnosis not present

## 2019-08-24 DIAGNOSIS — G608 Other hereditary and idiopathic neuropathies: Secondary | ICD-10-CM

## 2019-08-24 DIAGNOSIS — Z7689 Persons encountering health services in other specified circumstances: Secondary | ICD-10-CM | POA: Insufficient documentation

## 2019-08-24 NOTE — Progress Notes (Signed)
Virtual Visit via Video Note  I connected with@  on 08/24/19 at  2:30 PM EST by a video enabled telemedicine application and verified that I am speaking with the correct person using two identifiers.  Location patient: home Location provider:work Persons participating in the virtual visit: patient, provider  I discussed the limitations of evaluation and management by telemedicine and the availability of in person appointments. The patient expressed understanding and agreed to proceed. Interactive audio and video telecommunications were attempted between this provider and patient, however failed, due to patient having technical difficulties or patient did not have access to video capability.  We continued and completed visit with audio only.    HPI:  Transfer of care Feels well, no complaints.   He is vitamin D , unsure of dose since in pill pack. Would like to know how much he should take. Started on 1000 or 2000 units.   Hypothyroidism- compliant with medication  Peripheral neuropathy- feels well on current dose of gabapentin. Has seen neurology in the past.   Knee and low back pain- taking Mobic 15mg  daily with relief. Able to bike ride without pain. Tried with 7.5 mg in the past and not sure if helpful.   H/o right lung cancer.   Bike rides 10-12 miles often.   ROS: See pertinent positives and negatives per HPI.  Past Medical History:  Diagnosis Date  . Arthritis   . GERD (gastroesophageal reflux disease)   . Thyroid disease    hypothyroid     Past Surgical History:  Procedure Laterality Date  . KNEE SURGERY     bilateral meniscal surgeries  . LUNG LOBECTOMY     upper right lung 12/15/2007     Family History  Problem Relation Age of Onset  . Cancer Mother   . Stroke Maternal Grandmother     SOCIAL HX: never smoker   Current Outpatient Medications:  .  cetirizine (ZYRTEC) 10 MG tablet, Take 10 mg by mouth daily., Disp: , Rfl:  .  cholecalciferol (VITAMIN  D3) 25 MCG (1000 UT) tablet, Take 1,000 Units by mouth daily., Disp: , Rfl:  .  fluticasone (FLONASE) 50 MCG/ACT nasal spray, PLACE 2 SPRAYS INTO BOTH NOSTRILS DAILY, Disp: 16 g, Rfl: 6 .  gabapentin (NEURONTIN) 300 MG capsule, Take 300 mg by mouth 2 (two) times daily., Disp: , Rfl:  .  ketoconazole (NIZORAL) 2 % cream, Apply  a small amount to affected area twice a day, Disp: , Rfl:  .  levothyroxine (SYNTHROID) 50 MCG tablet, TAKE 1 TABLET BY MOUTH ONCE A DAY BEFOREBREAKFAST., Disp: 90 tablet, Rfl: 3 .  loperamide (IMODIUM) 2 MG capsule, loperamide 2 mg capsule, Disp: , Rfl:  .  meloxicam (MOBIC) 15 MG tablet, TAKE 1 TABLET BY MOUTH ONCE DAILY, Disp: 90 tablet, Rfl: 1 .  montelukast (SINGULAIR) 10 MG tablet, TAKE 1 TABLET BY MOUTH EVERY NIGHT AT BEDTIME, Disp: 90 tablet, Rfl: 3 .  Multiple Vitamin (MULTIVITAMIN WITH MINERALS) TABS tablet, Take 1 tablet by mouth daily., Disp: , Rfl:  .  nystatin cream (MYCOSTATIN), nystatin 100,000 unit/gram topical cream, Disp: , Rfl:  .  Probiotic Product (PROBIOTIC-10) CAPS, Take by mouth., Disp: , Rfl:  .  triamcinolone (KENALOG) 0.025 % ointment, triamcinolone acetonide 0.025 % topical ointment, Disp: , Rfl:  .  albuterol (VENTOLIN HFA) 108 (90 Base) MCG/ACT inhaler, Ventolin HFA 90 mcg/actuation aerosol inhaler, Disp: , Rfl:  .  Olopatadine HCl 0.2 % SOLN, olopatadine 0.2 % eye drops, Disp: ,  Rfl:   EXAM:  VITALS per patient if applicable:  GENERAL: alert, oriented, appears well and in no acute distress  HEENT: atraumatic, conjunttiva clear, no obvious abnormalities on inspection of external nose and ears  NECK: normal movements of the head and neck  LUNGS: on inspection no signs of respiratory distress, breathing rate appears normal, no obvious gross SOB, gasping or wheezing  CV: no obvious cyanosis  MS: moves all visible extremities without noticeable abnormality  PSYCH/NEURO: pleasant and cooperative, no obvious depression or anxiety, speech  and thought processing grossly intact  ASSESSMENT AND PLAN:  Discussed the following assessment and plan:  Hypothyroidism, unspecified type  Sensory peripheral neuropathy  Osteoarthritis, unspecified osteoarthritis type, unspecified site  Vitamin D deficiency  Encounter to establish care Problem List Items Addressed This Visit      Endocrine   Hypothyroidism    Compliant with medication Of note:  patient declines labs at this time and plans to have when he comes in for follow-up in new year.        Nervous and Auditory   Sensory peripheral neuropathy    Well-controlled on gabapentin, will continue        Musculoskeletal and Integument   Osteoarthritis    Doing well meloxicam.  Advised him to do a trial of 7.5 mg ( versus 15mg ) to reduce his exposure to NSAIDs.  He was very much in agreement        Other   Encounter to establish care    Reviewed medical history with patient today      Vitamin D deficiency    History of vitamin D deficiency, advised 800 IU daily.  We will check vitamin D when he comes in the new year         -we discussed possible serious and likely etiologies, options for evaluation and workup, limitations of telemedicine visit vs in person visit, treatment, treatment risks and precautions. Pt prefers to treat via telemedicine empirically rather then risking or undertaking an in person visit at this moment. Patient agrees to seek prompt in person care if worsening, new symptoms arise, or if is not improving with treatment.   I discussed the assessment and treatment plan with the patient. The patient was provided an opportunity to ask questions and all were answered. The patient agreed with the plan and demonstrated an understanding of the instructions.   The patient was advised to call back or seek an in-person evaluation if the symptoms worsen or if the condition fails to improve as anticipated.   Mable Paris, FNP

## 2019-08-24 NOTE — Assessment & Plan Note (Signed)
Doing well meloxicam.  Advised him to do a trial of 7.5 mg ( versus 15mg ) to reduce his exposure to NSAIDs.  He was very much in agreement

## 2019-08-24 NOTE — Assessment & Plan Note (Signed)
History of vitamin D deficiency, advised 800 IU daily.  We will check vitamin D when he comes in the new year

## 2019-08-24 NOTE — Assessment & Plan Note (Signed)
Well-controlled on gabapentin, will continue

## 2019-08-24 NOTE — Assessment & Plan Note (Signed)
Compliant with medication Of note:  patient declines labs at this time and plans to have when he comes in for follow-up in new year.

## 2019-08-24 NOTE — Assessment & Plan Note (Signed)
Reviewed medical history with patient today

## 2019-09-13 DIAGNOSIS — Z23 Encounter for immunization: Secondary | ICD-10-CM | POA: Diagnosis not present

## 2019-09-19 ENCOUNTER — Ambulatory Visit: Payer: Medicare Other | Admitting: Podiatry

## 2019-09-21 DIAGNOSIS — L308 Other specified dermatitis: Secondary | ICD-10-CM | POA: Diagnosis not present

## 2019-09-21 DIAGNOSIS — N481 Balanitis: Secondary | ICD-10-CM | POA: Diagnosis not present

## 2019-09-21 DIAGNOSIS — L219 Seborrheic dermatitis, unspecified: Secondary | ICD-10-CM | POA: Diagnosis not present

## 2019-09-30 ENCOUNTER — Telehealth: Payer: Self-pay | Admitting: Family

## 2019-09-30 DIAGNOSIS — E039 Hypothyroidism, unspecified: Secondary | ICD-10-CM

## 2019-09-30 DIAGNOSIS — Z125 Encounter for screening for malignant neoplasm of prostate: Secondary | ICD-10-CM

## 2019-09-30 NOTE — Telephone Encounter (Signed)
Pt is wanting to have labs done before his appt on 10/28/19

## 2019-10-03 NOTE — Telephone Encounter (Signed)
Pt wanted advice if you felt that the digital prostate exam was still beneficial? He is going to think on PSA & let us know. I told him however that you do not do prostate exams.

## 2019-10-03 NOTE — Telephone Encounter (Signed)
Call pt Labs ordered however Medicare would not cover annual PSA.  He would need to sign a waiver form to have this done.  If He is interested in having the PSA which is the prostate annual screening, please let me know and will have to print form

## 2019-10-03 NOTE — Addendum Note (Signed)
Addended by: Burnard Hawthorne on: 10/03/2019 10:03 AM   Modules accepted: Orders

## 2019-10-03 NOTE — Telephone Encounter (Signed)
Call pt I do them for men who are having symptoms.

## 2019-10-04 NOTE — Telephone Encounter (Signed)
I called & advised patient of why you do not normally do rectal exams. Patient isn't having any issues but has decided that he will sign waiver to have PSA checked.

## 2019-10-04 NOTE — Telephone Encounter (Signed)
I was advised that PSA is paid for if you order PSA medicare. It is done at one year & one day a part for it to be covered.

## 2019-10-07 NOTE — Telephone Encounter (Signed)
I spoke to patient & he will stop by when he is close to office to sign form. Form is at my desk & has to be signed before PSA can be ordered.

## 2019-10-07 NOTE — Telephone Encounter (Signed)
Call pt  He still needs to sign waiver as when I order it it ALERTS me that it is NOT covered Once he signs ( I have put on your desk), we can order and he have done

## 2019-10-11 DIAGNOSIS — Z23 Encounter for immunization: Secondary | ICD-10-CM | POA: Diagnosis not present

## 2019-10-14 DIAGNOSIS — L820 Inflamed seborrheic keratosis: Secondary | ICD-10-CM | POA: Diagnosis not present

## 2019-10-14 DIAGNOSIS — L570 Actinic keratosis: Secondary | ICD-10-CM | POA: Diagnosis not present

## 2019-10-14 DIAGNOSIS — K130 Diseases of lips: Secondary | ICD-10-CM | POA: Diagnosis not present

## 2019-10-19 ENCOUNTER — Ambulatory Visit: Payer: Medicare Other | Admitting: Podiatry

## 2019-10-21 ENCOUNTER — Encounter: Payer: Self-pay | Admitting: Family

## 2019-10-21 ENCOUNTER — Other Ambulatory Visit: Payer: Medicare Other

## 2019-10-21 ENCOUNTER — Other Ambulatory Visit: Payer: Self-pay

## 2019-10-21 ENCOUNTER — Other Ambulatory Visit (INDEPENDENT_AMBULATORY_CARE_PROVIDER_SITE_OTHER): Payer: Medicare Other

## 2019-10-21 DIAGNOSIS — E039 Hypothyroidism, unspecified: Secondary | ICD-10-CM

## 2019-10-21 DIAGNOSIS — Z125 Encounter for screening for malignant neoplasm of prostate: Secondary | ICD-10-CM

## 2019-10-21 LAB — LIPID PANEL
Cholesterol: 175 mg/dL (ref 0–200)
HDL: 46.1 mg/dL (ref >39.00)
LDL Cholesterol: 113 mg/dL — ABNORMAL HIGH (ref 0–99)
NonHDL: 128.64
Total CHOL/HDL Ratio: 4
Triglycerides: 78 mg/dL (ref 0.0–149.0)
VLDL: 15.6 mg/dL (ref 0.0–40.0)

## 2019-10-21 LAB — CBC WITH DIFFERENTIAL/PLATELET
Basophils Absolute: 0 10*3/uL (ref 0.0–0.1)
Basophils Relative: 0.4 % (ref 0.0–3.0)
Eosinophils Absolute: 0.1 10*3/uL (ref 0.0–0.7)
Eosinophils Relative: 2 % (ref 0.0–5.0)
HCT: 39.3 % (ref 39.0–52.0)
Hemoglobin: 13.1 g/dL (ref 13.0–17.0)
Lymphocytes Relative: 39.3 % (ref 12.0–46.0)
Lymphs Abs: 1.8 10*3/uL (ref 0.7–4.0)
MCHC: 33.4 g/dL (ref 30.0–36.0)
MCV: 91.5 fl (ref 78.0–100.0)
Monocytes Absolute: 0.6 10*3/uL (ref 0.1–1.0)
Monocytes Relative: 12.8 % — ABNORMAL HIGH (ref 3.0–12.0)
Neutro Abs: 2 10*3/uL (ref 1.4–7.7)
Neutrophils Relative %: 45.5 % (ref 43.0–77.0)
Platelets: 162 10*3/uL (ref 150.0–400.0)
RBC: 4.29 Mil/uL (ref 4.22–5.81)
RDW: 12.9 % (ref 11.5–15.5)
WBC: 4.5 10*3/uL (ref 4.0–10.5)

## 2019-10-21 LAB — COMPREHENSIVE METABOLIC PANEL
ALT: 11 U/L (ref 0–53)
AST: 16 U/L (ref 0–37)
Albumin: 4.3 g/dL (ref 3.5–5.2)
Alkaline Phosphatase: 50 U/L (ref 39–117)
BUN: 24 mg/dL — ABNORMAL HIGH (ref 6–23)
CO2: 29 mEq/L (ref 19–32)
Calcium: 9 mg/dL (ref 8.4–10.5)
Chloride: 104 mEq/L (ref 96–112)
Creatinine, Ser: 0.89 mg/dL (ref 0.40–1.50)
GFR: 82.81 mL/min (ref >60.00)
Glucose, Bld: 97 mg/dL (ref 70–99)
Potassium: 4.1 mEq/L (ref 3.5–5.1)
Sodium: 139 mEq/L (ref 135–145)
Total Bilirubin: 0.5 mg/dL (ref 0.2–1.2)
Total Protein: 7 g/dL (ref 6.0–8.3)

## 2019-10-21 LAB — TSH: TSH: 1.55 u[IU]/mL (ref 0.35–4.50)

## 2019-10-21 LAB — PSA, MEDICARE: PSA: 0.59 ng/ml (ref 0.10–4.00)

## 2019-10-21 NOTE — Addendum Note (Signed)
Addended by: Leeanne Rio on: 10/21/2019 12:39 PM   Modules accepted: Orders

## 2019-10-22 ENCOUNTER — Encounter: Payer: Self-pay | Admitting: Family

## 2019-10-24 NOTE — Progress Notes (Signed)
Subjective:    Patient ID: Kevin Terrell, Dr., male    DOB: 1941/06/21, 79 y.o.   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    HPI: Establish care  Feels well today  No complaints He would like to primarily discuss preventative medicine as he cares for his wife whom has dementia and is very important to him that he stays healthy.  He would like EKG for baseline. Exercising regularly, riding a bike an hour /day. no sob, cp, dizziness.  Peripheral neuropathy-doing well on gabapentin. Not progressive. Has seen Dr Manuella Ghazi for this.   H/o right lung cancer, right lobectomy. No longer having surveillance.  Completed 6 years of PET scan with oncology in [facility], and told no further surveillance needed.    Colonoscopy 2011. Cologuard negative 2020. Interested in one last colonoscopy.    HISTORY:  Past Medical History:  Diagnosis Date  . Arthritis   . GERD (gastroesophageal reflux disease)   . Lung cancer ([place]) 2009   chemo, NO radiation. treated in [place]  . Thyroid disease    hypothyroid    Past Surgical History:  Procedure Laterality Date  . KNEE SURGERY     bilateral meniscal surgeries  . LUNG LOBECTOMY     upper right lung 12/15/2007    Family History  Problem Relation Age of Onset  . Cancer Mother   . Stroke Maternal Grandmother     Allergies: Compazine [prochlorperazine edisylate] and Other Current Outpatient Medications on File Prior to Visit  Medication Sig Dispense Refill  . albuterol (VENTOLIN HFA) 108 (90 Base) MCG/ACT inhaler Ventolin HFA 90 mcg/actuation aerosol inhaler    . cetirizine (ZYRTEC) 10 MG tablet Take 10 mg by mouth daily.    . cholecalciferol (VITAMIN D3) 25 MCG (1000 UT) tablet Take 1,000 Units by mouth daily.    . fluticasone (FLONASE) 50 MCG/ACT nasal spray PLACE 2 SPRAYS INTO BOTH NOSTRILS DAILY 16 g 6  . gabapentin (NEURONTIN) 300 MG capsule Take 300 mg by mouth 2 (two) times daily.     Marland Kitchen ketoconazole (NIZORAL) 2 % cream Apply  a small amount to affected area twice a day    . levothyroxine (SYNTHROID) 50 MCG tablet TAKE 1 TABLET BY MOUTH ONCE A DAY BEFOREBREAKFAST. 90 tablet 3  . loperamide (IMODIUM) 2 MG capsule loperamide 2 mg capsule    . meloxicam (MOBIC) 15 MG tablet TAKE 1 TABLET BY MOUTH ONCE DAILY 90 tablet 1  . montelukast (SINGULAIR) 10 MG tablet TAKE 1 TABLET BY MOUTH EVERY NIGHT AT BEDTIME 90 tablet 3  . Multiple Vitamin (MULTIVITAMIN WITH MINERALS) TABS tablet Take 1 tablet by mouth daily.    Marland Kitchen nystatin cream (MYCOSTATIN) nystatin 100,000 unit/gram topical cream    . Olopatadine HCl 0.2 % SOLN olopatadine 0.2 % eye drops    . Probiotic Product (PROBIOTIC-10) CAPS Take by mouth.    . triamcinolone (KENALOG) 0.025 % ointment triamcinolone acetonide 0.025 % topical ointment     No current facility-administered medications on file prior to visit.    Social History   Tobacco Use  . Smoking status: Never Smoker  . Smokeless tobacco: Never Used  Substance Use Topics  . Alcohol use: Yes    Alcohol/week: 1.0 standard drinks    Types: 1 Glasses of wine per week    Comment: per once weekly   . Drug use: Not on file    Review of Systems  Constitutional: Negative for chills and fever.  Respiratory: Negative for cough.   Cardiovascular: Negative for chest pain and palpitations.  Gastrointestinal: Negative for nausea and vomiting.      Objective:    BP 122/80   Pulse 69   Temp (!) 96.5 F (35.8 C) (Temporal)   Ht 6\' 1"  (1.854 m)   Wt 189 lb 12.8 oz (86.1 kg)   SpO2 99%   BMI 25.04 kg/m  BP Readings from Last 3 Encounters:  10/28/19 122/80  10/26/18 110/76  10/26/18 110/76   Wt Readings from Last 3 Encounters:  10/28/19 189 lb 12.8 oz (86.1 kg)  08/24/19 180 lb (81.6 kg)  10/26/18 182 lb 6.4 oz (82.7 kg)    Physical Exam Vitals reviewed.  Constitutional:      Appearance: He is well-developed.  Neck:     Thyroid: No thyroid mass or  thyromegaly.  Cardiovascular:     Rate and Rhythm: Regular rhythm.     Heart sounds: Normal heart sounds.  Pulmonary:     Effort: Pulmonary effort is normal. No respiratory distress.     Breath sounds: Normal breath sounds. No wheezing, rhonchi or rales.  Lymphadenopathy:     Head:     Right side of head: No submental, submandibular, tonsillar, preauricular, posterior auricular or occipital adenopathy.     Left side of head: No submental, submandibular, tonsillar, preauricular, posterior auricular or occipital adenopathy.     Cervical: No cervical adenopathy.  Skin:    General: Skin is warm and dry.  Neurological:     Mental Status: He is alert.  Psychiatric:        Speech: Speech normal.        Behavior: Behavior normal.        Assessment & Plan:   Problem List Items Addressed This Visit      Endocrine   Hypothyroidism    Euthyroid. Continue current dose        Other   History of lobectomy of lung   Relevant Orders   Ambulatory referral to Hematology / Oncology   HLD (hyperlipidemia) - Primary    Uncontrolled .discussed cardiovascular risk and patient would like risk medication with cardiology.  Referral has been placed for this.  We also discussed starting low-dose Crestor.  Patient will schedule labs in 6 weeks      Relevant Medications   rosuvastatin (CRESTOR) 10 MG tablet   Other Relevant Orders   Ambulatory referral to Cardiology   US Carotid Duplex Bilateral   Comprehensive metabolic panel   Hx of cancer of lung    Symptomatically stable although patient would like to establish with oncologist here for ongoing surveillance.  Referral has been placed      Screening for cardiovascular, respiratory, and genitourinary diseases    EKG shows sinus rhythm.  Heart rate is 65.  When compared to prior EKG February 2020, no significant changes.      Relevant Orders   EKG 12-Lead    Other Visit Diagnoses    Screen for colon cancer       Relevant Orders    Ambulatory referral to Gastroenterology       I am having Kevin Terrell, Dr. start on rosuvastatin. I am also having him maintain his gabapentin, multivitamin with minerals, cetirizine, Probiotic-10, albuterol, loperamide, nystatin cream, Olopatadine HCl, triamcinolone, meloxicam, montelukast, fluticasone, levothyroxine, ketoconazole, and cholecalciferol.   Meds ordered this encounter  Medications  . rosuvastatin (CRESTOR) 10 MG tablet    Sig:  Take 1 tablet (10 mg total) by mouth daily.    Dispense:  90 tablet    Refill:  3    Order Specific Question:   Supervising Provider    Answer:   Crecencio Mc [[id number]]    Return precautions given.   Risks, benefits, and alternatives of the medications and treatment plan prescribed today were discussed, and patient expressed understanding.   Education regarding symptom management and diagnosis given to patient on AVS.  Continue to follow with Burnard Hawthorne, FNP for routine health maintenance.   Kevin Terrell, Dr. and I agreed with plan.   Mable Paris, FNP

## 2019-10-24 NOTE — Telephone Encounter (Signed)
Patient will come in office Friday for appointment & EKG.

## 2019-10-27 ENCOUNTER — Encounter: Payer: Self-pay | Admitting: Family

## 2019-10-28 ENCOUNTER — Ambulatory Visit (INDEPENDENT_AMBULATORY_CARE_PROVIDER_SITE_OTHER): Payer: Medicare Other

## 2019-10-28 ENCOUNTER — Ambulatory Visit: Payer: Medicare Other

## 2019-10-28 ENCOUNTER — Ambulatory Visit: Payer: Medicare Other | Admitting: Family Medicine

## 2019-10-28 ENCOUNTER — Ambulatory Visit (INDEPENDENT_AMBULATORY_CARE_PROVIDER_SITE_OTHER): Payer: Medicare Other | Admitting: Family

## 2019-10-28 ENCOUNTER — Encounter: Payer: Self-pay | Admitting: Family

## 2019-10-28 ENCOUNTER — Other Ambulatory Visit: Payer: Self-pay

## 2019-10-28 VITALS — BP 122/80 | HR 69 | Temp 96.5°F | Ht 73.0 in | Wt 189.8 lb

## 2019-10-28 VITALS — Ht 73.0 in | Wt 189.0 lb

## 2019-10-28 DIAGNOSIS — E039 Hypothyroidism, unspecified: Secondary | ICD-10-CM

## 2019-10-28 DIAGNOSIS — Z0000 Encounter for general adult medical examination without abnormal findings: Secondary | ICD-10-CM

## 2019-10-28 DIAGNOSIS — Z136 Encounter for screening for cardiovascular disorders: Secondary | ICD-10-CM

## 2019-10-28 DIAGNOSIS — Z902 Acquired absence of lung [part of]: Secondary | ICD-10-CM | POA: Diagnosis not present

## 2019-10-28 DIAGNOSIS — Z1383 Encounter for screening for respiratory disorder NEC: Secondary | ICD-10-CM

## 2019-10-28 DIAGNOSIS — Z85118 Personal history of other malignant neoplasm of bronchus and lung: Secondary | ICD-10-CM | POA: Diagnosis not present

## 2019-10-28 DIAGNOSIS — Z1389 Encounter for screening for other disorder: Secondary | ICD-10-CM

## 2019-10-28 DIAGNOSIS — Z1211 Encounter for screening for malignant neoplasm of colon: Secondary | ICD-10-CM

## 2019-10-28 DIAGNOSIS — E785 Hyperlipidemia, unspecified: Secondary | ICD-10-CM

## 2019-10-28 MED ORDER — ROSUVASTATIN CALCIUM 10 MG PO TABS: 10.0000 mg | ORAL_TABLET | Freq: Every day | ORAL | 3 refills | Status: DC

## 2019-10-28 NOTE — Patient Instructions (Addendum)
  Kevin Terrell , Thank you for taking time to come for your Medicare Wellness Visit. I appreciate your ongoing commitment to your health goals. Please review the following plan we discussed and let me know if I can assist you in the future.   These are the goals we discussed: Goals      Patient Stated   . Increase physical activity (pt-stated)     Low impact exercises (stationary bicycling, walking) 3 days weekly, 30-45 minutes Maintain weight       This is a list of the screening recommended for you and due dates:  Health Maintenance  Topic Date Due  . Tetanus Vaccine  10/26/2028  . Flu Shot  Completed  . Pneumonia vaccines  Completed

## 2019-10-28 NOTE — Assessment & Plan Note (Addendum)
Euthyroid. Continue current dose

## 2019-10-28 NOTE — Patient Instructions (Signed)
Referral to gastroenterology, rheumatology, cardiology.  I have also placed carotid ultrasound.    Remember non fasting labs in 6 weeks.  Stay safe

## 2019-10-28 NOTE — Assessment & Plan Note (Signed)
EKG shows sinus rhythm.  Heart rate is 65.  When compared to prior EKG February 2020, no significant changes.

## 2019-10-28 NOTE — Assessment & Plan Note (Signed)
Symptomatically stable although patient would like to establish with oncologist here for ongoing surveillance.  Referral has been placed

## 2019-10-28 NOTE — Progress Notes (Addendum)
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  Review of Systems:  No ROS.  Medicare Wellness Virtual Visit.  Visual/audio telehealth visit, UTA vital signs.   Ht/Wt provided  See social history for additional risk factors.   Cardiac Risk Factors include: advanced age (>45men, >77 women);male gender     Objective:    Vitals: Ht 6\' 1"  (1.854 m)   Wt 189 lb (85.7 kg)   BMI 24.94 kg/m   Body mass index is 24.94 kg/m.  Advanced Directives 10/28/2019 10/26/2018  Does Patient Have a Medical Advance Directive? Yes Yes  Type of Paramedic of [place];Living will [place];Living will;Out of facility DNR (pink MOST or yellow form)  Does patient want to make changes to medical advance directive? No - Patient declined No - Patient declined  Copy of [place] in Chart? Yes - validated most recent copy scanned in chart (See row information) Yes - validated most recent copy scanned in chart (See row information)    Tobacco Social History   Tobacco Use  Smoking Status Never Smoker  Smokeless Tobacco Never Used     Counseling given: Not Answered   Clinical Intake:  Pre-visit preparation completed: Yes        Diabetes: No  How often do you need to have someone help you when you read instructions, pamphlets, or other written materials from your doctor or pharmacy?: 1 - Never  Interpreter Needed?: No     Past Medical History:  Diagnosis Date  . Arthritis   . GERD (gastroesophageal reflux disease)   . Lung cancer ([place]) 2009   chemo, NO radiation. treated in [place]  . Thyroid disease    hypothyroid    Past Surgical History:  Procedure Laterality Date  . KNEE SURGERY     bilateral meniscal surgeries  . LUNG LOBECTOMY     upper right lung 12/15/2007    Family History  Problem Relation Age of Onset  . Cancer Mother   .  Stroke Maternal Grandmother    Social History   Socioeconomic History  . Marital status: Married    Spouse name: Not on file  . Number of children: Not on file  . Years of education: Not on file  . Highest education level: Not on file  Occupational History  . Not on file  Tobacco Use  . Smoking status: Never Smoker  . Smokeless tobacco: Never Used  Substance and Sexual Activity  . Alcohol use: Yes    Alcohol/week: 1.0 standard drinks    Types: 1 Glasses of wine per week    Comment: per once weekly   . Drug use: Not on file  . Sexual activity: Yes  Other Topics Concern  . 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    Lives at [facility] Strain:   . Difficulty of Paying Living Expenses: Not on file  Food Insecurity:   . Worried About Charity fundraiser in the Last Year: Not on file  . Ran Out of Food in the Last Year: Not on file  Transportation Needs:   .  Lack of Transportation (Medical): Not on file  . Lack of Transportation (Non-Medical): Not on file  Physical Activity:   . Days of Exercise per Week: Not on file  . Minutes of Exercise per Session: Not on file  Stress:   . Feeling of Stress : Not on file  Social Connections:   . Frequency of Communication with Friends and Family: Not on file  . Frequency of Social Gatherings with Friends and Family: Not on file  . Attends Religious Services: Not on file  . Active Member of Clubs or Organizations: Not on file  . Attends Archivist Meetings: Not on file  . Marital Status: Not on file    Outpatient Encounter Medications as of 10/28/2019  Medication Sig  . VITAMIN D, CHOLECALCIFEROL, PO Take 5,000 Units by mouth daily.  Marland Kitchen albuterol (VENTOLIN HFA) 108 (90 Base) MCG/ACT  inhaler Ventolin HFA 90 mcg/actuation aerosol inhaler  . cetirizine (ZYRTEC) 10 MG tablet Take 10 mg by mouth daily.  . cholecalciferol (VITAMIN D3) 25 MCG (1000 UT) tablet Take 1,000 Units by mouth daily.  . fluticasone (FLONASE) 50 MCG/ACT nasal spray PLACE 2 SPRAYS INTO BOTH NOSTRILS DAILY  . gabapentin (NEURONTIN) 300 MG capsule Take 300 mg by mouth 2 (two) times daily.  Marland Kitchen ketoconazole (NIZORAL) 2 % cream Apply  a small amount to affected area twice a day  . levothyroxine (SYNTHROID) 50 MCG tablet TAKE 1 TABLET BY MOUTH ONCE A DAY BEFOREBREAKFAST.  Marland Kitchen loperamide (IMODIUM) 2 MG capsule loperamide 2 mg capsule  . meloxicam (MOBIC) 15 MG tablet TAKE 1 TABLET BY MOUTH ONCE DAILY  . montelukast (SINGULAIR) 10 MG tablet TAKE 1 TABLET BY MOUTH EVERY NIGHT AT BEDTIME  . Multiple Vitamin (MULTIVITAMIN WITH MINERALS) TABS tablet Take 1 tablet by mouth daily.  Marland Kitchen nystatin cream (MYCOSTATIN) nystatin 100,000 unit/gram topical cream  . Olopatadine HCl 0.2 % SOLN olopatadine 0.2 % eye drops  . Probiotic Product (PROBIOTIC-10) CAPS Take by mouth.  . rosuvastatin (CRESTOR) 10 MG tablet Take 1 tablet (10 mg total) by mouth daily.  Marland Kitchen triamcinolone (KENALOG) 0.025 % ointment triamcinolone acetonide 0.025 % topical ointment   No facility-administered encounter medications on file as of 10/28/2019.    Activities of Daily Living In your present state of health, do you have any difficulty performing the following activities: 10/28/2019 10/28/2019  Hearing? N N  Vision? N N  Difficulty concentrating or making decisions? N N  Walking or climbing stairs? N N  Dressing or bathing? N N  Doing errands, shopping? N N  Preparing Food and eating ? N -  Using the Toilet? N -  In the past six months, have you accidently leaked urine? N -  Do you have problems with loss of bowel control? N -  Managing your Medications? N -  Managing your Finances? N -  Housekeeping or managing your Housekeeping? Y -  Comment Maid  assist -  Some recent data might be hidden    Patient Care Team: Burnard Hawthorne, FNP as PCP - General (Family Medicine)   Assessment:   This is a routine wellness examination for Kevin Terrell.  Nurse connected with patient 10/28/19 at  1:30 PM EST by a telephone enabled telemedicine application and verified that I am speaking with the correct person using two identifiers. Patient stated full name and DOB. Patient gave permission to continue with virtual visit. Patient's location was at home and Nurse's location was at [place] office.   Patient is alert  and oriented x3. Patient denies difficulty focusing or concentrating. Patient likes to read, attends Bible study, and online zoom interaction for brain stimulation.   Health Maintenance Due: See completed HM at the end of note.   Eye: Visual acuity not assessed. Virtual visit. Followed by their ophthalmologist.  Dental: Visits every 6 months.    Hearing: Demonstrates normal hearing during visit.  Safety:  Patient feels safe at home- yes Patient does have smoke detectors at home- yes Patient does wear sunscreen or protective clothing when in direct sunlight - yes Patient does wear seat belt when in a moving vehicle - yes Patient drives- yes Adequate lighting in walkways free from debris- yes Grab bars and handrails used as appropriate- yes Ambulates with an assistive device- no Cell phone on person when ambulating outside of the home- yes  Social: Alcohol intake - yes      Smoking history- never   Smokers in home? none Illicit drug use? none  Medication: Taking as directed and without issues.  Added Vitamin D 5000 UI, daily  Blister pack in use -yes  Self managed - yes   Covid-19: Precautions and sickness symptoms discussed. Wears mask, social distancing, hand hygiene as appropriate.   Activities of Daily Living Patient denies needing assistance with: feeding themselves, getting from bed to chair, getting to the  toilet, bathing/showering, dressing, managing money, or preparing meals.  Assisted by Home Care with household chores as needed.   Discussed the importance of a healthy diet, water intake and the benefits of aerobic exercise.   Physical activity- walking, bicycling  Diet:  Regular Water: good intake  Other Providers Patient Care Team: Burnard Hawthorne, FNP as PCP - General (Family Medicine)  Exercise Activities and Dietary recommendations Current Exercise Habits: Home exercise routine, Type of exercise: walking(bike riding), Intensity: Mild  Goals      Patient Stated   . Increase physical activity (pt-stated)     Low impact exercises (stationary bicycling, walking) 3 days weekly, 30-45 minutes Maintain weight       Fall Risk Fall Risk  10/28/2019 08/24/2019 10/26/2018 05/17/2018  Falls in the past year? 0 0 0 No  Follow up Falls evaluation completed Falls evaluation completed - -   Timed Get Up and Go Performed: no, virtual visit  Depression Screen PHQ 2/9 Scores 10/28/2019 10/26/2018 05/17/2018  PHQ - 2 Score 0 0 0    Cognitive Function MMSE - Mini Mental State Exam 10/26/2018  Orientation to time 5  Orientation to Place 5  Registration 3  Attention/ Calculation 5  Recall 3  Language- name 2 objects 2  Language- repeat 1  Language- follow 3 step command 3  Language- read & follow direction 1  Write a sentence 1  Copy design 1  Total score 30     6CIT Screen 10/28/2019  What Year? 0 points  What month? 0 points  What time? 0 points    Immunization History  Administered Date(s) Administered  . Hep A / Hep B 08/06/2005, 01/28/2006, 07/17/2006  . Hepatitis B 08/15/1992, 09/25/1992, 01/24/1993  . Influenza, High Dose Seasonal PF 05/10/2019  . Influenza-Unspecified 05/11/2018, 05/10/2019  . Moderna SARS-COVID-2 Vaccination 09/13/2019, 10/11/2019  . Pneumococcal Conjugate-13 02/04/2014  . Pneumococcal Polysaccharide-23 05/21/2017  . Smallpox 03/16/1969  .  Tdap 10/26/2018  . Zoster 09/08/2007  . Zoster Recombinat (Shingrix) 05/06/2018, 07/23/2018   Screening Tests Health Maintenance  Topic Date Due  . TETANUS/TDAP  10/26/2028  . INFLUENZA VACCINE  Completed  .  PNA vac Low Risk Adult  Completed      Plan:   Keep all routine maintenance appointments.   Follow up with non fasting labs as previously directed by PMD.  Medicare Attestation I have personally reviewed: The patient's medical and social history Their use of alcohol, tobacco or illicit drugs Their current medications and supplements The patient's functional ability including ADLs,fall risks, home safety risks, cognitive, and hearing and visual impairment Diet and physical activities Evidence for depression   I have reviewed and discussed with patient certain preventive protocols, quality metrics, and best practice recommendations.     Varney Biles, LPN  3/33/8329  Agree with plan. Mable Paris, NP

## 2019-10-28 NOTE — Assessment & Plan Note (Signed)
Uncontrolled .discussed cardiovascular risk and patient would like risk medication with cardiology.  Referral has been placed for this.  We also discussed starting low-dose Crestor.  Patient will schedule labs in 6 weeks

## 2019-11-01 NOTE — Progress Notes (Signed)
New Outpatient Visit Date: 11/02/2019  Referring Provider: Burnard Hawthorne, FNP [address] [place],  [place] [address]  Chief Complaint: Elevated cholesterol  HPI:  Kevin Terrell is a 79 y.o. male who is being seen today for the evaluation of hyperlipidemia at the request of Ms. Arnett. He has a history of hyperlipidemia, lung cancer s/p right upper lobectomy and chemotherapy, hypothyroidism, GERD, arthritis, and peripheral neuropathy.  Today, Kevin Terrell reports feeling well.  He relocated to [facility] just over a year ago to be closer to his family.  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  He notes having a syncopal episode about 25 years ago leading to extended inpatient cardiac monitoring and a stress echocardiogram, both of which were unrevealing.  He also had an episode of chest pain after eating spicy food 10+ years ago while in [place].  Stress test at that time was reportedly also normal.  Kevin Terrell exercises regularly without any limitations.  He has not had any chest pain, shortness of breath, palpitations, lightheadedness, and edema.  Kevin Terrell is concerned about his cardiovascular risk factors, noting that he would like to optimize his health as he is caring for his wife with moderate dementia.  Recent labs checked by Ms. Arnett were notable for mildly elevated LDL.  He was just started on rosuvastatin and has tolerated the first few doses well.  He has also been referred for carotid Doppler, which is currently pending.  --------------------------------------------------------------------------------------------------  Cardiovascular History & Procedures: Cardiovascular Problems:  Hyperlipidemia  Risk Factors:  Hyperlipidemia, male gender, and age > 69  Cath/PCI:  None  CV Surgery:  None  EP Procedures and Devices:  None  Non-Invasive Evaluation(s):  None available  Recent CV Pertinent Labs: Lab Results   Component Value Date   CHOL 175 10/21/2019   HDL 46.10 10/21/2019   LDLCALC 113 (H) 10/21/2019   TRIG 78.0 10/21/2019   CHOLHDL 4 10/21/2019   K 4.1 10/21/2019   BUN 24 (H) 10/21/2019   CREATININE 0.89 10/21/2019    --------------------------------------------------------------------------------------------------  Past Medical History:  Diagnosis Date  . Arthritis   . GERD (gastroesophageal reflux disease)   . Lung cancer ([place]) 2009   chemo, NO radiation. treated in [place]  . Thyroid disease    hypothyroid     Past Surgical History:  Procedure Laterality Date  . KNEE SURGERY     bilateral meniscal surgeries  . LUNG LOBECTOMY     upper right lung 12/15/2007     Current Meds  Medication Sig  . albuterol (VENTOLIN HFA) 108 (90 Base) MCG/ACT inhaler Ventolin HFA 90 mcg/actuation aerosol inhaler  . cetirizine (ZYRTEC) 10 MG tablet Take 10 mg by mouth daily.  . cholecalciferol (VITAMIN D3) 25 MCG (1000 UT) tablet Take 1,000 Units by mouth daily.  . fluticasone (FLONASE) 50 MCG/ACT nasal spray PLACE 2 SPRAYS INTO BOTH NOSTRILS DAILY  . gabapentin (NEURONTIN) 300 MG capsule Take 300 mg by mouth 2 (two) times daily.  Marland Kitchen ketoconazole (NIZORAL) 2 % cream Apply  a small amount to affected area twice a day  . levothyroxine (SYNTHROID) 50 MCG tablet TAKE 1 TABLET BY MOUTH ONCE A DAY BEFOREBREAKFAST.  Marland Kitchen loperamide (IMODIUM) 2 MG capsule loperamide 2 mg capsule  . meloxicam (MOBIC) 15 MG tablet TAKE 1 TABLET BY MOUTH ONCE DAILY  . montelukast (SINGULAIR) 10 MG tablet TAKE 1 TABLET BY MOUTH EVERY NIGHT AT BEDTIME  . Multiple Vitamin (MULTIVITAMIN WITH MINERALS)  TABS tablet Take 1 tablet by mouth daily.  Marland Kitchen nystatin cream (MYCOSTATIN) nystatin 100,000 unit/gram topical cream  . Olopatadine HCl 0.2 % SOLN olopatadine 0.2 % eye drops  . Probiotic Product (PROBIOTIC-10) CAPS Take by mouth.  . rosuvastatin (CRESTOR) 10 MG tablet Take 1 tablet (10 mg total) by mouth daily.  Marland Kitchen  triamcinolone (KENALOG) 0.025 % ointment triamcinolone acetonide 0.025 % topical ointment  . VITAMIN D, CHOLECALCIFEROL, PO Take 5,000 Units by mouth daily.    Allergies: Compazine [prochlorperazine edisylate] and Other  Social History   Tobacco Use  . Smoking status: Never Smoker  . Smokeless tobacco: Never Used  Substance Use Topics  . Alcohol use: Yes    Comment: Once glass of wine per month  . Drug use: Never    Family History  Problem Relation Age of Onset  . Cancer Mother   . Alzheimer's disease Mother   . Stroke Maternal Grandmother   . Hypertension Father   . Alzheimer's disease Father     Review of Systems: A 12-system review of systems was performed and was negative except as noted in the HPI.  --------------------------------------------------------------------------------------------------  Physical Exam: BP 122/78 (BP Location: Right Arm, Patient Position: Sitting, Cuff Size: Normal)   Pulse 71   Ht 6\' 1"  (1.854 m)   Wt 189 lb (85.7 kg)   SpO2 97%   BMI 24.94 kg/m   General:  NAD. HEENT: No conjunctival pallor or scleral icterus. Facemask in place. Neck: Supple without lymphadenopathy, thyromegaly, JVD, or HJR. No carotid bruit. Lungs: Normal work of breathing. Clear to auscultation bilaterally without wheezes or crackles. Heart: Regular rate and rhythm without murmurs, rubs, or gallops. Non-displaced PMI. Abd: Bowel sounds present. Soft, NT/ND without hepatosplenomegaly Ext: No lower extremity edema. Radial, PT, and DP pulses are 2+ bilaterally Skin: Warm and dry without rash. Neuro: CNIII-XII intact. Strength and fine-touch sensation intact in upper and lower extremities bilaterally. Psych: Normal mood and affect.  EKG:  Normal sinus rhythm without abnormality.  Lab Results  Component Value Date   WBC 4.5 10/21/2019   HGB 13.1 10/21/2019   HCT 39.3 10/21/2019   MCV 91.5 10/21/2019   PLT 162.0 10/21/2019    Lab Results  Component Value Date    NA 139 10/21/2019   K 4.1 10/21/2019   CL 104 10/21/2019   CO2 29 10/21/2019   BUN 24 (H) 10/21/2019   CREATININE 0.89 10/21/2019   GLUCOSE 97 10/21/2019   ALT 11 10/21/2019    Lab Results  Component Value Date   CHOL 175 10/21/2019   HDL 46.10 10/21/2019   LDLCALC 113 (H) 10/21/2019   TRIG 78.0 10/21/2019   CHOLHDL 4 10/21/2019   --------------------------------------------------------------------------------------------------  ASSESSMENT AND PLAN: Hyperlipidemia: Kevin Terrell was recently noted to have mildly elevated LDL (113).  Cardiac risk factors include age and male gender.  He is very active, walking and cycling on a regular basis without any symptoms.  His 10-year ASCVD risk is 25.6%.  In light of this, we have agreed to continue recently added rosuvastatin to help optimize ASCVD risk.  If carotid Doppler demonstrates atherosclerosis, more aggressive lipid therapy may need to be considered to achieve an LDL less than 70.  Kevin Terrell reports that follow-up lipids have been scheduled for 6 weeks from now, which I think is reasonable.  We will defer additional testing and medication changes today, though if carotid Doppler is normal we may consider coronary calcium scoring in the future to further refine ASCVD  risk.  Follow-up: Return to clinic in 3 months.  Nelva Bush, MD 11/02/2019 9:35 PM

## 2019-11-02 ENCOUNTER — Other Ambulatory Visit: Payer: Self-pay

## 2019-11-02 ENCOUNTER — Encounter: Payer: Self-pay | Admitting: Internal Medicine

## 2019-11-02 ENCOUNTER — Ambulatory Visit (INDEPENDENT_AMBULATORY_CARE_PROVIDER_SITE_OTHER): Payer: Medicare Other | Admitting: Internal Medicine

## 2019-11-02 VITALS — BP 122/78 | HR 71 | Ht 73.0 in | Wt 189.0 lb

## 2019-11-02 DIAGNOSIS — E7800 Pure hypercholesterolemia, unspecified: Secondary | ICD-10-CM

## 2019-11-02 NOTE — Patient Instructions (Signed)
Medication Instructions:  Your physician recommends that you continue on your current medications as directed. Please refer to the Current Medication list given to you today.  *If you need a refill on your cardiac medications before your next appointment, please call your pharmacy*   Lab Work: None If you have labs (blood work) drawn today and your tests are completely normal, you will receive your results only by: Marland Kitchen MyChart Message (if you have MyChart) OR . A paper copy in the mail If you have any lab test that is abnormal or we need to change your treatment, we will call you to review the results.   Testing/Procedures: None   Follow-Up: At [facility], you and your health needs are our priority.  As part of our continuing mission to provide you with exceptional heart care, we have created designated Provider Care Teams.  These Care Teams include your primary Cardiologist (physician) and Advanced Practice Providers (APPs -  Physician Assistants and Nurse Practitioners) who all work together to provide you with the care you need, when you need it.  We recommend signing up for the patient portal called "MyChart".  Sign up information is provided on this After Visit Summary.  MyChart is used to connect with patients for Virtual Visits (Telemedicine).  Patients are able to view lab/test results, encounter notes, upcoming appointments, etc.  Non-urgent messages can be sent to your provider as well.   To learn more about what you can do with MyChart, go to [email or web address].    Your next appointment:   3 month(s)  The format for your next appointment:   In Person  Provider:   Nelva Bush, MD   Other Instructions

## 2019-11-04 ENCOUNTER — Other Ambulatory Visit: Payer: Self-pay

## 2019-11-04 ENCOUNTER — Telehealth: Payer: Self-pay

## 2019-11-04 DIAGNOSIS — Z1211 Encounter for screening for malignant neoplasm of colon: Secondary | ICD-10-CM

## 2019-11-04 MED ORDER — NA SULFATE-K SULFATE-MG SULF 17.5-3.13-1.6 GM/177ML PO SOLN: 1.0000 | Freq: Once | ORAL | 0 refills | Status: AC

## 2019-11-04 NOTE — Telephone Encounter (Signed)
Gastroenterology Pre-Procedure Review  Request Date: Wed 11/30/19 Requesting Physician: Dr. Marius Ditch  PATIENT REVIEW QUESTIONS: The patient responded to the following health history questions as indicated:    1. Are you having any GI issues? no 2. Do you have a personal history of Polyps? no 3. Do you have a family history of Colon Cancer or Polyps? no 4. Diabetes Mellitus? no 5. Joint replacements in the past 12 months?no 6. Major health problems in the past 3 months?no 12 year lung cancer survivor 7. Any artificial heart valves, MVP, or defibrillator?no    MEDICATIONS & ALLERGIES:    Patient reports the following regarding taking any anticoagulation/antiplatelet therapy:   Plavix, Coumadin, Eliquis, Xarelto, Lovenox, Pradaxa, Brilinta, or Effient? no Aspirin? no  Patient confirms/reports the following medications:  Current Outpatient Medications  Medication Sig Dispense Refill  . albuterol (VENTOLIN HFA) 108 (90 Base) MCG/ACT inhaler Ventolin HFA 90 mcg/actuation aerosol inhaler    . cetirizine (ZYRTEC) 10 MG tablet Take 10 mg by mouth daily.    . cholecalciferol (VITAMIN D3) 25 MCG (1000 UT) tablet Take 1,000 Units by mouth daily.    . fluticasone (FLONASE) 50 MCG/ACT nasal spray PLACE 2 SPRAYS INTO BOTH NOSTRILS DAILY 16 g 6  . gabapentin (NEURONTIN) 300 MG capsule Take 300 mg by mouth 2 (two) times daily.    Marland Kitchen ketoconazole (NIZORAL) 2 % cream Apply  a small amount to affected area twice a day    . levothyroxine (SYNTHROID) 50 MCG tablet TAKE 1 TABLET BY MOUTH ONCE A DAY BEFOREBREAKFAST. 90 tablet 3  . loperamide (IMODIUM) 2 MG capsule loperamide 2 mg capsule    . meloxicam (MOBIC) 15 MG tablet TAKE 1 TABLET BY MOUTH ONCE DAILY 90 tablet 1  . montelukast (SINGULAIR) 10 MG tablet TAKE 1 TABLET BY MOUTH EVERY NIGHT AT BEDTIME 90 tablet 3  . Multiple Vitamin (MULTIVITAMIN WITH MINERALS) TABS tablet Take 1 tablet by mouth daily.    Marland Kitchen nystatin cream (MYCOSTATIN) nystatin 100,000  unit/gram topical cream    . Olopatadine HCl 0.2 % SOLN olopatadine 0.2 % eye drops    . Probiotic Product (PROBIOTIC-10) CAPS Take by mouth.    . rosuvastatin (CRESTOR) 10 MG tablet Take 1 tablet (10 mg total) by mouth daily. 90 tablet 3  . triamcinolone (KENALOG) 0.025 % ointment triamcinolone acetonide 0.025 % topical ointment    . VITAMIN D, CHOLECALCIFEROL, PO Take 5,000 Units by mouth daily.     No current facility-administered medications for this visit.    Patient confirms/reports the following allergies:  Allergies  Allergen Reactions  . Compazine [Prochlorperazine Edisylate]     muscle spasmus   . Other     [place] - hives    No orders of the defined types were placed in this encounter.   AUTHORIZATION INFORMATION Primary Insurance: 1D#: Group #:  Secondary Insurance: 1D#: Group #:  SCHEDULE INFORMATION: Date: 11/30/19 Time: Location:[facility]

## 2019-11-07 ENCOUNTER — Other Ambulatory Visit: Payer: Self-pay

## 2019-11-07 ENCOUNTER — Inpatient Hospital Stay: Payer: Medicare Other | Attending: Oncology | Admitting: Oncology

## 2019-11-07 ENCOUNTER — Encounter: Payer: Self-pay | Admitting: Oncology

## 2019-11-07 VITALS — BP 107/71 | HR 70 | Temp 95.0°F | Ht 73.0 in | Wt 192.9 lb

## 2019-11-07 DIAGNOSIS — Z9221 Personal history of antineoplastic chemotherapy: Secondary | ICD-10-CM | POA: Diagnosis not present

## 2019-11-07 DIAGNOSIS — Z85118 Personal history of other malignant neoplasm of bronchus and lung: Secondary | ICD-10-CM | POA: Insufficient documentation

## 2019-11-07 NOTE — Progress Notes (Signed)
Patient stated that he has had bilateral knee pain and bilateral feet neuropathy. Patient is here today to establish care for his prostate cancer.

## 2019-11-08 NOTE — Progress Notes (Signed)
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  He is s/p right upper lobectomy and underwent 4 cycles of adjuvant chemotherapy back then.  This is as per patient history and I do not have any oncology records from back then.  Patient states that he required chemotherapy as he was found to have a high mitotic rate in his final pathology but the size of the final tumor was a little over 2 cm with negative nodes.  Patient has been in remission since then and was getting surveillance CT scans.  Last scan was done in 2019 which did not show any evidence of recurrence.  He would now like to reestablish follow-up with oncology.  Overall patient feels well.  Denies any exertional shortness of breath.  Appetite and weight have been normal.  Denies any specific complaints at this time  ECOG PS- 0  Pain scale- 0   Review of systems- Review of Systems  Constitutional: Negative for chills, fever, malaise/fatigue and weight loss.  HENT: Negative for congestion, ear discharge and nosebleeds.   Eyes: Negative for blurred vision.  Respiratory: Negative for cough, hemoptysis, sputum production, shortness of breath and wheezing.   Cardiovascular: Negative for chest pain, palpitations, orthopnea and claudication.  Gastrointestinal: Negative for abdominal pain, blood in stool, constipation, diarrhea, heartburn, melena, nausea and vomiting.  Genitourinary: Negative for dysuria, flank pain, frequency, hematuria  and urgency.  Musculoskeletal: Negative for back pain, joint pain and myalgias.  Skin: Negative for rash.  Neurological: Negative for dizziness, tingling, focal weakness, seizures, weakness and headaches.  Endo/Heme/Allergies: Does not bruise/bleed easily.  Psychiatric/Behavioral: Negative for depression and suicidal ideas. The patient does not have insomnia.     Allergies  Allergen Reactions  . Compazine [Prochlorperazine Edisylate]     muscle spasmus   . Other     [place] - hives    Patient Active Problem List   Diagnosis Date Noted  . HLD (hyperlipidemia) 10/28/2019  . Screening for cardiovascular, respiratory, and genitourinary diseases 10/28/2019  . Vitamin D deficiency 08/24/2019  . Encounter to establish care 08/24/2019  . Osteoarthritis 01/26/2019  . Hx of cancer of lung 08/22/2018  . 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 Seasonal allergies 05/18/2018  . Hypothyroidism 05/18/2018  . Gastroesophageal reflux disease without esophagitis 05/18/2018  . 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 Chronic midline low back pain 05/18/2018  . Low serum vitamin B12 10/07/2016  . Non-small cell lung cancer ([place]) 10/07/2016  . Pain in left knee 10/07/2016  . 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 Bone pain 07/07/2016     Past Medical History:  Diagnosis Date  . Arthritis   . GERD (gastroesophageal reflux disease)   . Lung cancer ([place]) 2009   chemo, NO radiation. treated in [place]  . Thyroid disease    hypothyroid      Past Surgical History:  Procedure Laterality Date  . KNEE SURGERY     bilateral meniscal surgeries  . LUNG LOBECTOMY     upper right lung 12/15/2007  Social History   Socioeconomic History  . Marital status: Married    Spouse name: Not on file  . Number of children: Not on file  . Years of education: Not on file  . Highest education level: Not on file  Occupational History  . Not on file  Tobacco Use  . Smoking status:  Never Smoker  . Smokeless tobacco: Never Used  Substance and Sexual Activity  . Alcohol use: Yes    Comment: Once glass of wine per month  . Drug use: Never  . Sexual activity: Yes  Other Topics Concern  . 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    Lives at [place] Strain:   . Difficulty of Paying Living Expenses: Not on file  Food Insecurity:   . Worried About Charity fundraiser in the Last Year: Not on file  . Ran Out of Food in the Last Year: Not on file  Transportation Needs:   . Lack of Transportation (Medical): Not on file  . Lack of Transportation (Non-Medical): Not on file  Physical Activity:   . Days of Exercise per Week: Not on file  . Minutes of Exercise per Session: Not on file  Stress:   . Feeling of Stress : Not on file  Social Connections:   . Frequency of Communication with Friends and Family: Not on file  . Frequency of Social Gatherings with Friends and Family: Not on file  . Attends Religious Services: Not on file  . Active Member of Clubs or Organizations: Not on file  . Attends Archivist Meetings: Not on file  . Marital Status: Not on file  Intimate Partner Violence:   . Fear of Current or Ex-Partner: Not on file  . Emotionally Abused: Not on file  . Physically Abused: Not on file  . Sexually Abused: Not on file     Family History  Problem Relation Age of Onset  . Cancer Mother   . Alzheimer's disease Mother   . Stroke Maternal Grandmother   . Hypertension Father   . Alzheimer's disease Father      Current Outpatient Medications:  .  cetirizine (ZYRTEC) 10 MG tablet, Take 10 mg by mouth daily., Disp: , Rfl:  .  cholecalciferol (VITAMIN D3) 25 MCG (1000 UT) tablet, Take 1,000 Units by  mouth daily., Disp: , Rfl:  .  gabapentin (NEURONTIN) 300 MG capsule, Take 300 mg by mouth 2 (two) times daily., Disp: , Rfl:  .  ketoconazole (NIZORAL) 2 % cream, Apply  a small amount to affected area twice a day, Disp: , Rfl:  .  levothyroxine (SYNTHROID) 50 MCG tablet, TAKE 1 TABLET BY MOUTH ONCE A DAY BEFOREBREAKFAST., Disp: 90 tablet, Rfl: 3 .  loperamide (IMODIUM) 2 MG capsule, loperamide 2 mg capsule, Disp: , Rfl:  .  meloxicam (MOBIC) 15 MG tablet, TAKE 1 TABLET BY MOUTH ONCE DAILY, Disp: 90 tablet, Rfl: 1 .  montelukast (SINGULAIR) 10 MG tablet, TAKE 1 TABLET BY MOUTH EVERY NIGHT AT BEDTIME, Disp: 90 tablet, Rfl: 3 .  Multiple Vitamin (MULTIVITAMIN WITH MINERALS) TABS tablet, Take 1 tablet by mouth daily., Disp: , Rfl:  .  nystatin cream (MYCOSTATIN), nystatin  100,000 unit/gram topical cream, Disp: , Rfl:  .  Olopatadine HCl 0.2 % SOLN, olopatadine 0.2 % eye drops, Disp: , Rfl:  .  Probiotic Product (PROBIOTIC-10) CAPS, Take by mouth., Disp: , Rfl:  .  rosuvastatin (CRESTOR) 10 MG tablet, Take 1 tablet (10 mg total) by mouth daily., Disp: 90 tablet, Rfl: 3 .  triamcinolone (KENALOG) 0.025 % ointment, triamcinolone acetonide 0.025 % topical ointment, Disp: , Rfl:  .  albuterol (VENTOLIN HFA) 108 (90 Base) MCG/ACT inhaler, Ventolin HFA 90 mcg/actuation aerosol inhaler, Disp: , Rfl:  .  fluticasone (FLONASE) 50 MCG/ACT nasal spray, PLACE 2 SPRAYS INTO BOTH NOSTRILS DAILY (Patient not taking: Reported on 11/07/2019), Disp: 16 g, Rfl: 6   Physical exam:  Vitals:   11/07/19 1036  BP: 107/71  Pulse: 70  Temp: (!) 95 F (35 C)  TempSrc: Tympanic  SpO2: 100%  Weight: 192 lb 14.4 oz (87.5 kg)  Height: 6\' 1"  (1.854 m)   Physical Exam Constitutional:      General: He is not in acute distress. HENT:     Head: Normocephalic and atraumatic.  Eyes:     Pupils: Pupils are equal, round, and reactive to light.  Cardiovascular:     Rate and Rhythm: Normal rate and regular rhythm.     Heart  sounds: Normal heart sounds.  Pulmonary:     Effort: Pulmonary effort is normal.     Breath sounds: Normal breath sounds.  Abdominal:     General: Bowel sounds are normal.     Palpations: Abdomen is soft.  Musculoskeletal:     Cervical back: Normal range of motion.  Skin:    General: Skin is warm and dry.  Neurological:     Mental Status: He is alert and oriented to person, place, and time.        CMP Latest Ref Rng & Units 10/21/2019  Glucose 70 - 99 mg/dL 97  BUN 6 - 23 mg/dL 24(H)  Creatinine 0.40 - 1.50 mg/dL 0.89  Sodium 135 - 145 mEq/L 139  Potassium 3.5 - 5.1 mEq/L 4.1  Chloride 96 - 112 mEq/L 104  CO2 19 - 32 mEq/L 29  Calcium 8.4 - 10.5 mg/dL 9.0  Total Protein 6.0 - 8.3 g/dL 7.0  Total Bilirubin 0.2 - 1.2 mg/dL 0.5  Alkaline Phos 39 - 117 U/L 50  AST 0 - 37 U/L 16  ALT 0 - 53 U/L 11   CBC Latest Ref Rng & Units 10/21/2019  WBC 4.0 - 10.5 K/uL 4.5  Hemoglobin 13.0 - 17.0 g/dL 13.1  Hematocrit 39.0 - 52.0 % 39.3  Platelets 150.0 - 400.0 K/uL 162.0    Assessment and plan- Patient is a 79 y.o. 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  Patient is currently in remission and is here to establish routine follow-up  Patient is greater than 5 years since his diagnosis of stage I lung cancer.  He therefore only needs yearly low-dose screening CT chest.  I will set him up with our lung cancer screening program with RN Burgess Estelle who will coordinate yearly CT scans with him.  If there are any concerns on CT scans or otherwise patient can be referred to [place].  I will be notified of any concerning findings on CT scan   Thank you for this kind referral and the opportunity to participate in the care of this patient  Visit Diagnosis 1. History of lung cancer     Dr. Randa Evens, MD, MPH [facility] [phone] 11/08/2019 8:10 AM

## 2019-11-10 ENCOUNTER — Encounter: Payer: Self-pay | Admitting: *Deleted

## 2019-11-10 ENCOUNTER — Telehealth: Payer: Self-pay | Admitting: *Deleted

## 2019-11-10 NOTE — Telephone Encounter (Signed)
Contacted patient regarding referral for lung cancer screening scan. Obtained history and reviewed criteria for lung cancer screening. Patient is made aware that even with his past history of lung cancer he is not a candidate for screening due to his limited smoking while in college only.   We further discussed possibilities of obtaining low dose CT scan for the purpose of lung cancer follow up or obtaining a scan of the cardiac arteries which would also give some visualization of the lung fields.   Will share this note with patients care team for decision making.   Patient is aware to contact me for any questions or concerns that I can assist with.

## 2019-11-13 NOTE — Telephone Encounter (Signed)
So can he get low dose screening ct chest or he is not eligible?

## 2019-11-14 ENCOUNTER — Other Ambulatory Visit: Payer: Self-pay | Admitting: *Deleted

## 2019-11-14 ENCOUNTER — Encounter: Payer: Self-pay | Admitting: *Deleted

## 2019-11-14 DIAGNOSIS — C3490 Malignant neoplasm of unspecified part of unspecified bronchus or lung: Secondary | ICD-10-CM

## 2019-11-21 ENCOUNTER — Telehealth: Payer: Self-pay | Admitting: Gastroenterology

## 2019-11-21 NOTE — Telephone Encounter (Signed)
Pt left vm he is scheduled for a procedure on  11/30/19 and has a couple of questions please call pt

## 2019-11-22 NOTE — Telephone Encounter (Signed)
Patients call has been returned.  He inquired about the check in process, and dietary prep was discussed.    Patient was advised that after he checks in at the [facility] for registration, he will then be escorted up to the second floor Endoscopy Dept for his colonoscopy.  Dietary prep was reviewed.  Patient wanted to know if he could have canned spinach on his low fiber diet, and coconut water on his clear liquid diet. I advised that he could have both, as long as there are no seeds and no pulp.  Thanks,  [place], [place]

## 2019-11-23 ENCOUNTER — Ambulatory Visit: Payer: Medicare Other | Admitting: Podiatry

## 2019-11-28 ENCOUNTER — Ambulatory Visit
Admission: RE | Admit: 2019-11-28 | Discharge: 2019-11-28 | Disposition: A | Discharge status: Home / Self Care | Payer: Medicare Other | Source: Ambulatory Visit | Attending: Oncology | Admitting: Oncology

## 2019-11-28 ENCOUNTER — Other Ambulatory Visit
Admission: RE | Admit: 2019-11-28 | Discharge: 2019-11-28 | Disposition: A | Discharge status: Home / Self Care | Payer: Medicare Other | Source: Ambulatory Visit | Attending: Gastroenterology | Admitting: Gastroenterology

## 2019-11-28 ENCOUNTER — Other Ambulatory Visit: Payer: Self-pay

## 2019-11-28 DIAGNOSIS — Z01812 Encounter for preprocedural laboratory examination: Secondary | ICD-10-CM | POA: Diagnosis not present

## 2019-11-28 DIAGNOSIS — C3490 Malignant neoplasm of unspecified part of unspecified bronchus or lung: Secondary | ICD-10-CM

## 2019-11-28 DIAGNOSIS — Z20822 Contact with and (suspected) exposure to covid-19: Secondary | ICD-10-CM | POA: Insufficient documentation

## 2019-11-28 LAB — SARS CORONAVIRUS 2 (TAT 6-24 HRS): SARS Coronavirus 2: NEGATIVE

## 2019-11-28 IMAGING — CT CT CHEST W/O CM
2 of 4 series · 15 of 36 positions shown, 18 images · non-contrast
Comparison: None

CLINICAL DATA: Restaging lung cancer.

EXAM:
CT CHEST WITHOUT CONTRAST
TECHNIQUE: Multidetector CT imaging of the chest was performed following the
standard protocol without IV contrast.

[Series 2: chest 2.00 · axial · 0.79mm/px · z∈[-1203,-911]mm · 12 of 174 slices shown, 15 images]
[im 14/174  mediastinal]
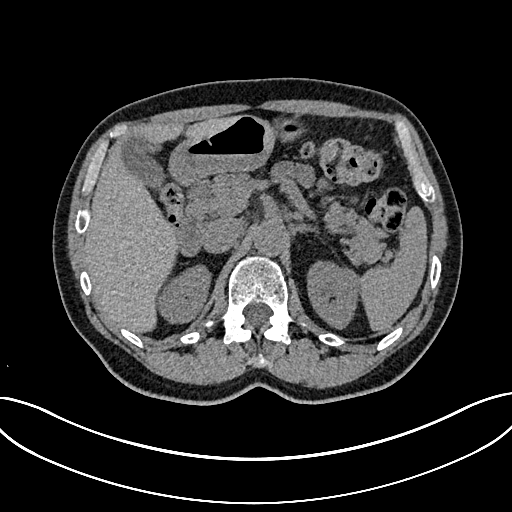
[im 14/174  lung]
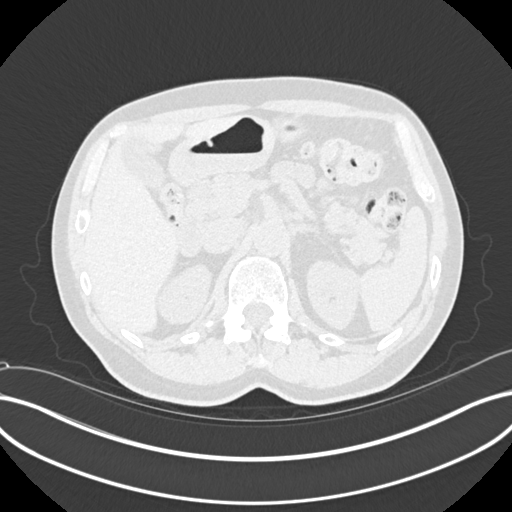
[im 27/174  lung]
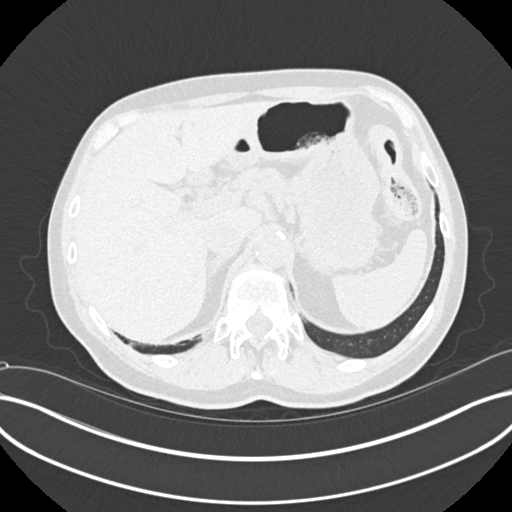
[im 40/174  lung]
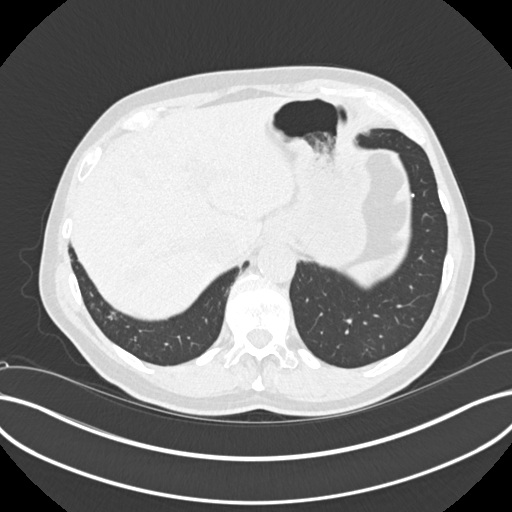
[im 54/174  lung]
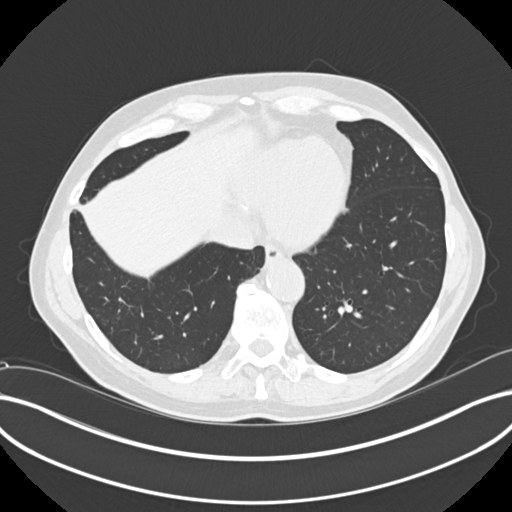
[im 67/174  mediastinal]
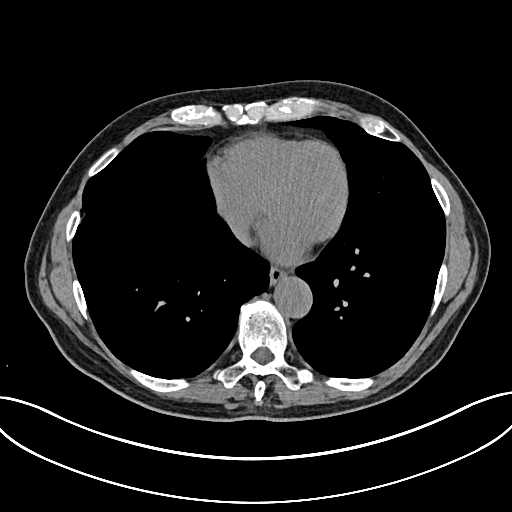
[im 67/174  lung]
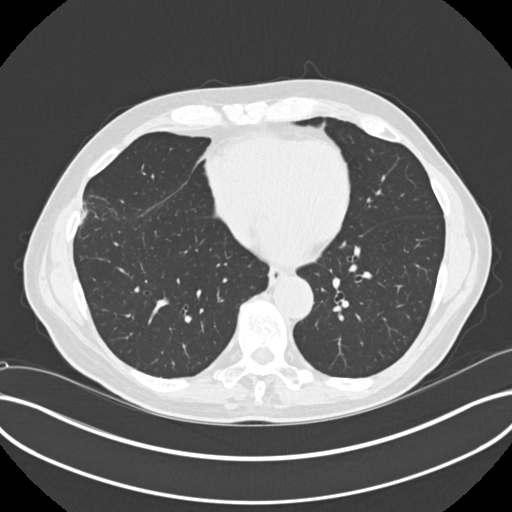
[im 80/174  lung]
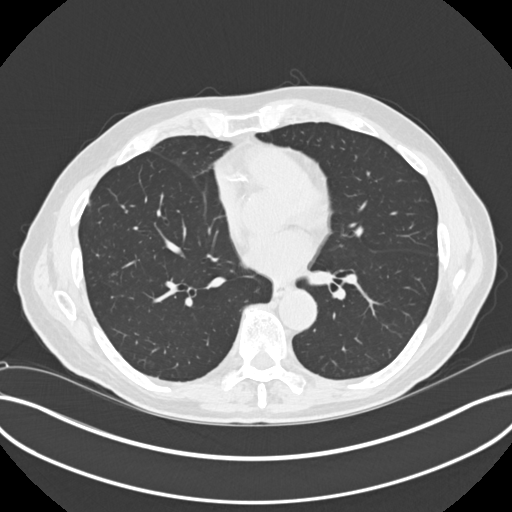
[im 94/174  lung]
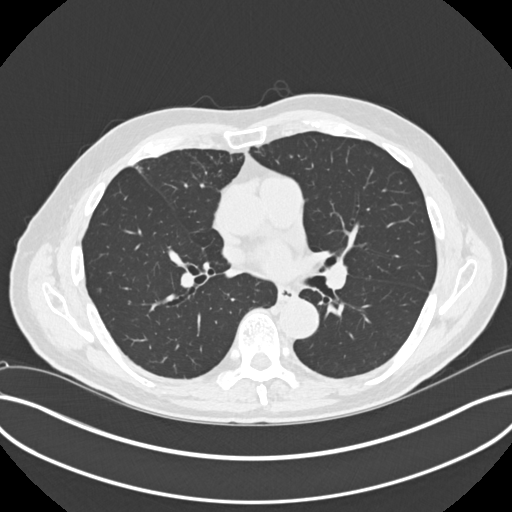
[im 107/174  lung]
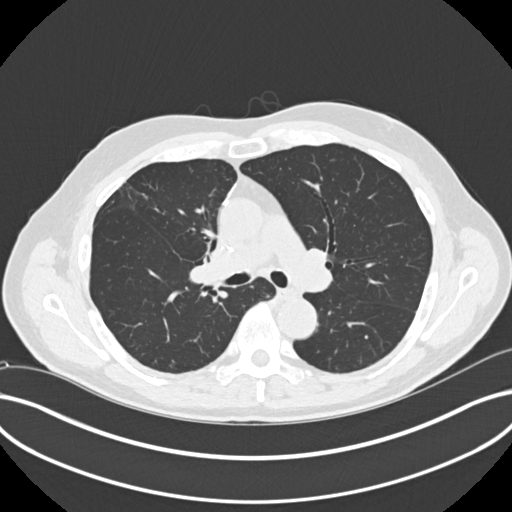
[im 120/174  mediastinal]
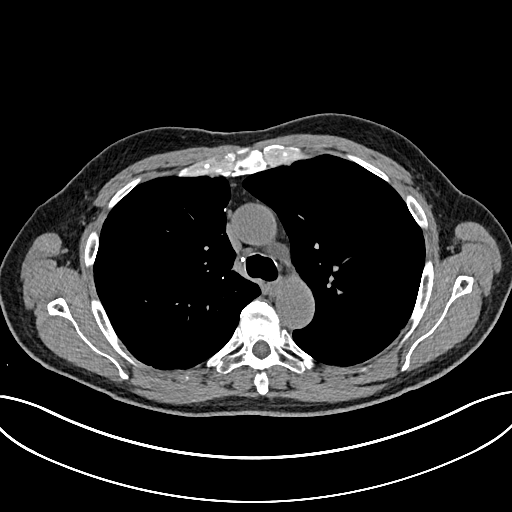
[im 120/174  lung]
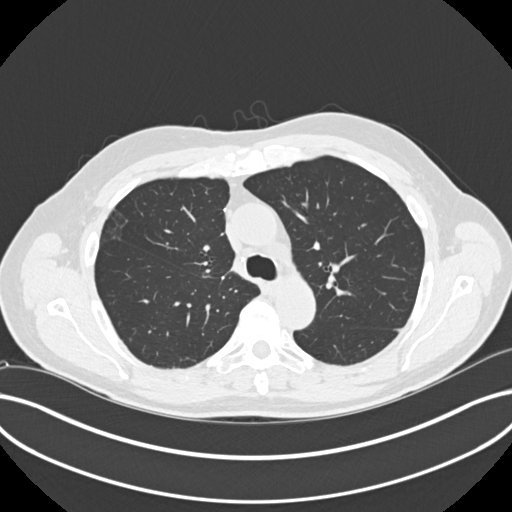
[im 134/174  lung]
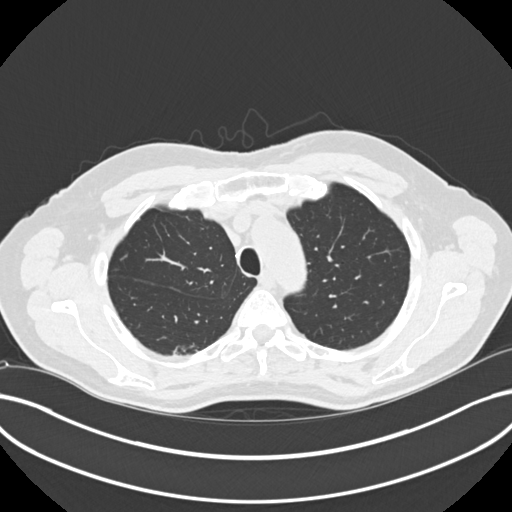
[im 147/174  lung]
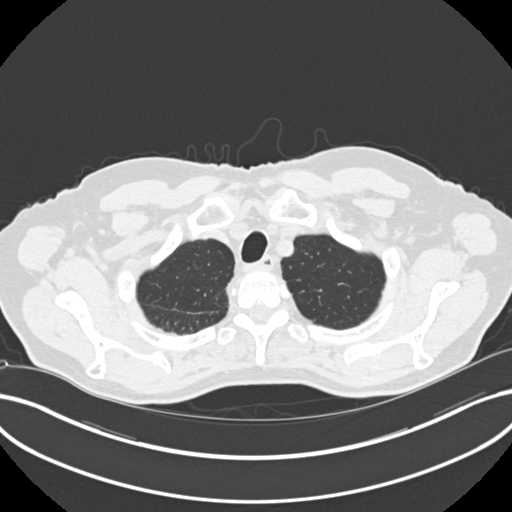
[im 160/174  lung]
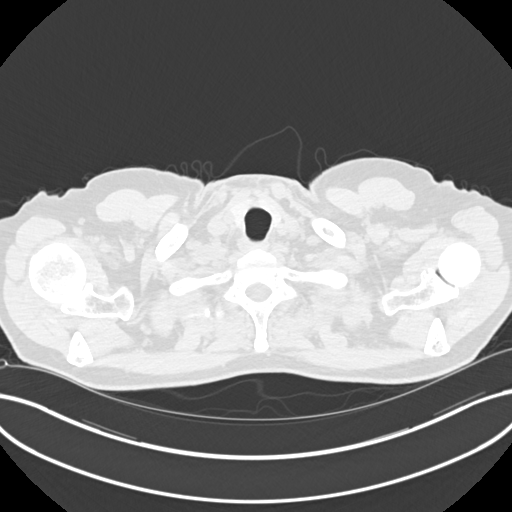

[Series 5: coronals chest 2.00 cor · coronal · 0.68mm/px · 3 of 180 slices shown]
[im 36/180  lung]
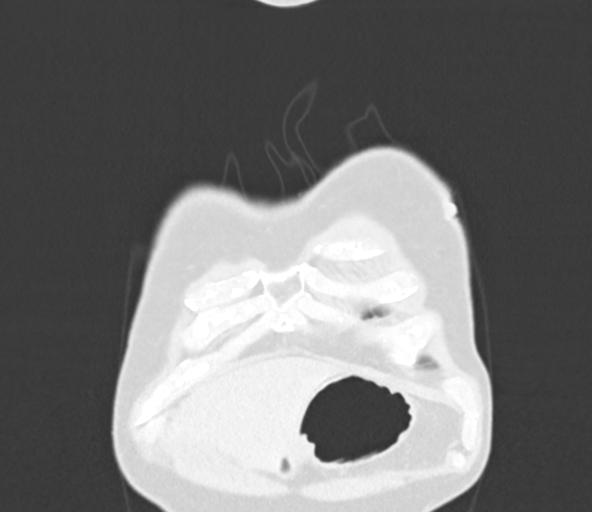
[im 72/180  lung]
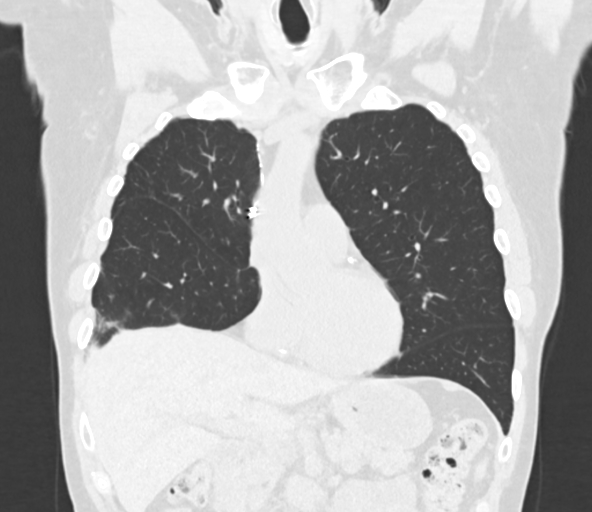
[im 108/180  lung]
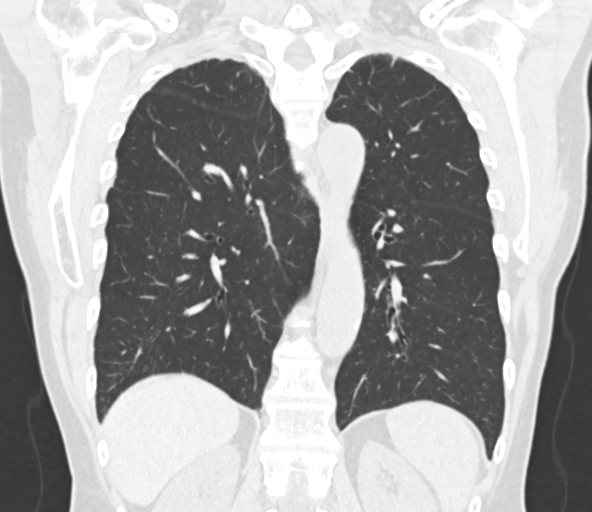

[15 of 36 positions shown; findings below may reference images not displayed]

FINDINGS: Cardiovascular: The heart size appears within normal limits. No
pericardial effusion identified. Aortic atherosclerosis. Lad and RCA
coronary artery atherosclerotic calcifications.

Mediastinum/Nodes: Normal appearance of the thyroid gland. The
trachea appears patent and is midline. Normal appearance of the
esophagus. No mediastinal or hilar adenopathy.

Lungs/Pleura: No pleural effusion identified. Mild changes of
paraseptal and centrilobular emphysema. Status post right upper
lobectomy. No specific findings to suggest recurrence of tumor
within the chest.

Upper Abdomen: Posterior right lobe of liver lesion measures 2.2 cm
and 48 HU, indeterminate, image 129/2.

Musculoskeletal: No chest wall mass or suspicious bone lesions
identified. There is degenerative type changes involving both
glenohumeral joints with loose body identified within the left
shoulder joint.
IMPRESSION: 1. No specific findings to suggest recurrent tumor or metastatic
disease within the chest status post right upper lobectomy.
2. Indeterminate lesion within posterior right hepatic lobe measures
2.2 cm and 48 Hounsfield units. No previous imaging available for
comparison. More definitive characterization with without and with
contrast material MRI or CT is recommended. Alternatively, if prior
imaging of the chest and or abdomen are available it would be
helpful to compare with today's study to assess for temporal change
in the appearance of the liver.
3. Coronary artery calcifications.

Aortic Atherosclerosis ([E1]-[E1]) and Emphysema ([E1]-[E1]).

## 2019-11-28 NOTE — Telephone Encounter (Signed)
Ct chest done today

## 2019-11-29 ENCOUNTER — Other Ambulatory Visit: Payer: Self-pay

## 2019-11-29 DIAGNOSIS — C3490 Malignant neoplasm of unspecified part of unspecified bronchus or lung: Secondary | ICD-10-CM

## 2019-11-30 ENCOUNTER — Other Ambulatory Visit: Payer: Self-pay

## 2019-11-30 ENCOUNTER — Encounter: Payer: Self-pay | Admitting: Gastroenterology

## 2019-11-30 ENCOUNTER — Ambulatory Visit
Admission: RE | Admit: 2019-11-30 | Discharge: 2019-11-30 | Disposition: A | Discharge status: Home / Self Care | Payer: Medicare Other | Attending: Gastroenterology | Admitting: Gastroenterology

## 2019-11-30 ENCOUNTER — Encounter
Admission: RE | Disposition: A | Discharge status: Home / Self Care | Payer: Self-pay | Source: Home / Self Care | Attending: Gastroenterology

## 2019-11-30 ENCOUNTER — Ambulatory Visit: Payer: Medicare Other | Admitting: Anesthesiology

## 2019-11-30 DIAGNOSIS — D125 Benign neoplasm of sigmoid colon: Secondary | ICD-10-CM | POA: Diagnosis not present

## 2019-11-30 DIAGNOSIS — K219 Gastro-esophageal reflux disease without esophagitis: Secondary | ICD-10-CM | POA: Insufficient documentation

## 2019-11-30 DIAGNOSIS — Z79899 Other long term (current) drug therapy: Secondary | ICD-10-CM | POA: Insufficient documentation

## 2019-11-30 DIAGNOSIS — E039 Hypothyroidism, unspecified: Secondary | ICD-10-CM | POA: Insufficient documentation

## 2019-11-30 DIAGNOSIS — K644 Residual hemorrhoidal skin tags: Secondary | ICD-10-CM | POA: Insufficient documentation

## 2019-11-30 DIAGNOSIS — Z823 Family history of stroke: Secondary | ICD-10-CM | POA: Diagnosis not present

## 2019-11-30 DIAGNOSIS — D122 Benign neoplasm of ascending colon: Secondary | ICD-10-CM | POA: Insufficient documentation

## 2019-11-30 DIAGNOSIS — K5730 Diverticulosis of large intestine without perforation or abscess without bleeding: Secondary | ICD-10-CM | POA: Diagnosis not present

## 2019-11-30 DIAGNOSIS — Z8249 Family history of ischemic heart disease and other diseases of the circulatory system: Secondary | ICD-10-CM | POA: Diagnosis not present

## 2019-11-30 DIAGNOSIS — Z9221 Personal history of antineoplastic chemotherapy: Secondary | ICD-10-CM | POA: Diagnosis not present

## 2019-11-30 DIAGNOSIS — Z820 Family history of epilepsy and other diseases of the nervous system: Secondary | ICD-10-CM | POA: Diagnosis not present

## 2019-11-30 DIAGNOSIS — Z809 Family history of malignant neoplasm, unspecified: Secondary | ICD-10-CM | POA: Insufficient documentation

## 2019-11-30 DIAGNOSIS — Z85118 Personal history of other malignant neoplasm of bronchus and lung: Secondary | ICD-10-CM | POA: Insufficient documentation

## 2019-11-30 DIAGNOSIS — L918 Other hypertrophic disorders of the skin: Secondary | ICD-10-CM | POA: Diagnosis not present

## 2019-11-30 DIAGNOSIS — L570 Actinic keratosis: Secondary | ICD-10-CM | POA: Diagnosis not present

## 2019-11-30 DIAGNOSIS — Z791 Long term (current) use of non-steroidal anti-inflammatories (NSAID): Secondary | ICD-10-CM | POA: Insufficient documentation

## 2019-11-30 DIAGNOSIS — M1990 Unspecified osteoarthritis, unspecified site: Secondary | ICD-10-CM | POA: Diagnosis not present

## 2019-11-30 DIAGNOSIS — Z1211 Encounter for screening for malignant neoplasm of colon: Secondary | ICD-10-CM | POA: Insufficient documentation

## 2019-11-30 DIAGNOSIS — K635 Polyp of colon: Secondary | ICD-10-CM | POA: Diagnosis not present

## 2019-11-30 DIAGNOSIS — K5790 Diverticulosis of intestine, part unspecified, without perforation or abscess without bleeding: Secondary | ICD-10-CM | POA: Diagnosis not present

## 2019-11-30 HISTORY — PX: COLONOSCOPY WITH PROPOFOL: SHX5780

## 2019-11-30 HISTORY — DX: Hypothyroidism, unspecified: E03.9

## 2019-11-30 SURGERY — COLONOSCOPY WITH PROPOFOL
Anesthesia: General

## 2019-11-30 MED ORDER — PROPOFOL 500 MG/50ML IV EMUL
INTRAVENOUS | Status: DC | PRN
  Administered 2019-11-30: 150 ug/kg/min via INTRAVENOUS

## 2019-11-30 MED ORDER — SODIUM CHLORIDE 0.9 % IV SOLN: INTRAVENOUS | Status: DC

## 2019-11-30 MED ORDER — PROPOFOL 10 MG/ML IV BOLUS
INTRAVENOUS | Status: DC | PRN
  Administered 2019-11-30: 70 mg via INTRAVENOUS

## 2019-11-30 NOTE — H&P (Signed)
Kevin Darby, MD [address]  [place]  [name], [name] [address]  Main: [phone]  Fax: [phone] Pager: [phone]  Primary Care Physician:  Kevin Hawthorne, FNP Primary Gastroenterologist:  Dr. 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   Past Medical History:  Diagnosis Date  . Actinic keratosis   . Arthritis   . GERD (gastroesophageal reflux disease)   . Hypothyroidism   . Lung cancer ([place]) 2009   chemo, NO radiation. treated in [place]  . Seborrheic dermatitis   . Thyroid disease    hypothyroid     Past Surgical History:  Procedure Laterality Date  . COLONOSCOPY    . KNEE SURGERY     bilateral meniscal surgeries  . LUNG LOBECTOMY     upper right lung 12/15/2007   . UPPER GI ENDOSCOPY      Prior to Admission medications   Medication Sig Start Date End Date Taking? Authorizing Provider  cetirizine (ZYRTEC) 10 MG tablet Take 10 mg by mouth daily.   Yes [provider]  cholecalciferol (VITAMIN D3) 25 MCG (1000 UT) tablet Take 1,000 Units by mouth daily.   Yes [provider]  gabapentin (NEURONTIN) 300 MG capsule Take 300 mg by mouth 2 (two) times daily.   Yes [provider]  levothyroxine (SYNTHROID) 50 MCG tablet TAKE 1 TABLET BY MOUTH ONCE A DAY BEFOREBREAKFAST. 07/14/19  Yes Terrell, Kevin Cree, FNP  meloxicam (MOBIC) 15 MG tablet TAKE 1 TABLET BY MOUTH ONCE DAILY 04/01/19  Yes Terrell, Kevin Cree, FNP  montelukast (SINGULAIR) 10 MG tablet TAKE 1 TABLET BY MOUTH EVERY NIGHT AT BEDTIME 04/01/19  Yes Terrell, Kevin Cree, FNP  Multiple Vitamin (MULTIVITAMIN WITH MINERALS) TABS tablet Take 1 tablet by mouth daily.   Yes [provider]  Probiotic Product (PROBIOTIC-10) CAPS Take by mouth.   Yes [provider]  rosuvastatin (CRESTOR) 10 MG tablet Take 1 tablet (10 mg total) by mouth daily. 10/28/19  Yes Terrell, Kevin Coder, FNP    albuterol (VENTOLIN HFA) 108 (90 Base) MCG/ACT inhaler Ventolin HFA 90 mcg/actuation aerosol inhaler    [provider]  fluticasone (FLONASE) 50 MCG/ACT nasal spray PLACE 2 SPRAYS INTO BOTH NOSTRILS DAILY Patient not taking: Reported on 11/07/2019 04/18/19   Jodelle Green, FNP  ketoconazole (NIZORAL) 2 % cream Apply  a small amount to affected area twice a day 08/22/19   [provider]  loperamide (IMODIUM) 2 MG capsule loperamide 2 mg capsule    [provider]  nystatin cream (MYCOSTATIN) nystatin 100,000 unit/gram topical cream    [provider]  Olopatadine HCl 0.2 % SOLN olopatadine 0.2 % eye drops    [provider]  triamcinolone (KENALOG) 0.025 % ointment triamcinolone acetonide 0.025 % topical ointment    [provider]    Allergies as of 11/07/2019 - Review Complete 11/07/2019  Allergen Reaction Noted  . Compazine [prochlorperazine edisylate]  05/17/2018  . Other  05/17/2018    Family History  Problem Relation Age of Onset  . Cancer Mother   . Alzheimer's disease Mother   . Stroke Maternal Grandmother   . Hypertension Father   . Alzheimer's disease Father     Social History   Socioeconomic History  . Marital status: Married    Spouse name: Not on file  . Number of children: Not on file  . Years of education:  Not on file  . Highest education level: Not on file  Occupational History  . Not on file  Tobacco Use  . Smoking status: Never Smoker  . Smokeless tobacco: Never Used  Substance and Sexual Activity  . Alcohol use: Yes    Comment: Once glass of wine per month,none last 24hrs  . Drug use: Never  . Sexual activity: Yes  Other Topics Concern  . 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    Lives at [place] Strain:   . Difficulty of Paying Living Expenses:   Food Insecurity:   . Worried About Charity fundraiser in the Last Year:   . Arboriculturist in the Last Year:   Transportation Needs:   . Film/video editor (Medical):   Marland Kitchen Lack of Transportation (Non-Medical):   Physical Activity:   . Days of Exercise per Week:   . Minutes of Exercise per Session:   Stress:   . Feeling of Stress :   Social Connections:   . Frequency of Communication with Friends and Family:   . Frequency of Social Gatherings with Friends and Family:   . Attends Religious Services:   . Active Member of Clubs or Organizations:   . Attends Archivist Meetings:   Marland Kitchen Marital Status:   Intimate Partner Violence:   . Fear of Current or Ex-Partner:   . Emotionally Abused:   Marland Kitchen Physically Abused:   . Sexually Abused:     Review of Systems: See HPI, otherwise negative ROS  Physical Exam: BP (!) 130/91   Pulse 77   Temp 97.6 F (36.4 C) (Temporal)   Resp 16   Ht 6\' 1"  (1.854 m)   Wt 83.9 kg   SpO2 100%   BMI 24.41 kg/m  General:   Alert,  pleasant and cooperative in NAD Head:  Normocephalic and atraumatic. Neck:  Supple; no masses or thyromegaly. Lungs:  Clear throughout to auscultation.    Heart:  Regular rate and rhythm. Abdomen:  Soft, nontender and nondistended. Normal bowel sounds, without guarding, and without rebound.   Neurologic:  Alert and  oriented x4;  grossly normal neurologically.  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  Questions have been answered.  All parties agreeable.   Sherri Sear, MD  11/30/2019, 9:59 AM

## 2019-11-30 NOTE — Anesthesia Procedure Notes (Signed)
Date/Time: 11/30/2019 10:16 AM Performed by: Nelda Marseille, CRNA Pre-anesthesia Checklist: Patient identified, Emergency Drugs available, Suction available, Patient being monitored and Timeout performed Oxygen Delivery Method: Nasal cannula

## 2019-11-30 NOTE — Transfer of Care (Signed)
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  Last Pain:  Vitals:   11/30/19 1042  TempSrc: (P) Temporal         Complications: No apparent anesthesia complications

## 2019-11-30 NOTE — Op Note (Signed)
[facility] Gastroenterology Patient Name: Kevin Terrell Procedure Date: 11/30/2019 9:58 AM MRN: [id number] Account #: [id number] Date of Birth: [birth year] Admit Type: Outpatient Age: 79 Room: [facility] ENDO ROOM 3 Gender: Male Note Status: Finalized Procedure:             Colonoscopy Indications:           Screening for colorectal malignant neoplasm Providers:             Lin Landsman MD, MD Medicines:             Monitored Anesthesia Care Complications:         No immediate complications. Estimated blood loss: None. Procedure:             Pre-Anesthesia Assessment:                        - Prior to the procedure, a History and Physical was                         performed, and patient medications and allergies were                         reviewed. The patient is competent. The risks and                         benefits of the procedure and the sedation options and                         risks were discussed with the patient. All questions                         were answered and informed consent was obtained.                         Patient identification and proposed procedure were                         verified by the physician, the nurse, the                         anesthesiologist, the anesthetist and the technician                         in the pre-procedure area in the procedure room in the                         endoscopy suite. Mental Status Examination: alert and                         oriented. Airway Examination: normal oropharyngeal                         airway and neck mobility. Respiratory Examination:                         clear to auscultation. CV Examination: normal.                         Prophylactic Antibiotics: The patient does not  require                         prophylactic antibiotics. Prior Anticoagulants: The                         patient has taken no previous anticoagulant or                         antiplatelet  agents. ASA Grade Assessment: II - A                         patient with mild systemic disease. After reviewing                         the risks and benefits, the patient was deemed in                         satisfactory condition to undergo the procedure. The                         anesthesia plan was to use monitored anesthesia care                         (MAC). Immediately prior to administration of                         medications, the patient was re-assessed for adequacy                         to receive sedatives. The heart rate, respiratory                         rate, oxygen saturations, blood pressure, adequacy of                         pulmonary ventilation, and response to care were                         monitored throughout the procedure. The physical                         status of the patient was re-assessed after the                         procedure.                        After obtaining informed consent, the colonoscope was                         passed under direct vision. Throughout the procedure,                         the patient's blood pressure, pulse, and oxygen                         saturations were monitored continuously. The  Colonoscope was introduced through the anus and                         advanced to the the cecum, identified by appendiceal                         orifice and ileocecal valve. The colonoscopy was                         performed without difficulty. The patient tolerated                         the procedure well. The quality of the bowel                         preparation was evaluated using the BBPS [facility] Bowel                         Preparation Scale) with scores of: Right Colon = 3,                         Transverse Colon = 3 and Left Colon = 3 (entire mucosa                         seen well with no residual staining, small fragments                         of stool or opaque liquid). The  total BBPS score                         equals 9. Findings:      Two sessile polyps were found in the ascending colon. The polyps were 5       mm in size. These polyps were removed with a cold snare. Resection and       retrieval were complete.      A 5 mm polyp was found in the sigmoid colon. The polyp was sessile. The       polyp was removed with a cold snare. Resection was complete, but the       polyp tissue was not retrieved.      Multiple diverticula were found in the sigmoid colon.      The retroflexed view of the distal rectum and anal verge was normal and       showed no anal or rectal abnormalities.      Skin tags were found on perianal exam.      The terminal ileum appeared normal. Impression:            - Two 5 mm polyps in the ascending colon, removed with                         a cold snare. Resected and retrieved.                        - One 5 mm polyp in the sigmoid colon, removed with a                         cold snare. Complete  resection. Polyp tissue not                         retrieved.                        - Diverticulosis in the entire examined colon.                        - The distal rectum and anal verge are normal on                         retroflexion view.                        - Perianal skin tags found on perianal exam. Recommendation:        - Discharge patient to home (with escort).                        - Resume previous diet today.                        - Continue present medications.                        - Await pathology results.                        - Repeat colonoscopy in 7 years for surveillance. Procedure Code(s):     --- Professional ---                        [phone], Colonoscopy, flexible; with removal of                         tumor(s), polyp(s), or other lesion(s) by snare                         technique Diagnosis Code(s):     --- Professional ---                        K63.5, Polyp of colon                         Z12.11, Encounter for screening for malignant neoplasm                         of colon                        K64.4, Residual hemorrhoidal skin tags                        K57.30, Diverticulosis of large intestine without                         perforation or abscess without bleeding CPT copyright 2019 American Medical Association. All rights reserved. The codes documented in this report are preliminary and upon coder review may  be revised to meet current compliance requirements. Dr. Ulyess Mort Lin Landsman MD, MD 11/30/2019 10:42:18 AM This report has been signed electronically. Number of Addenda:  0 Note Initiated On: 11/30/2019 9:58 AM Scope Withdrawal Time: 0 hours 12 minutes 44 seconds  Total Procedure Duration: 0 hours 16 minutes 51 seconds  Estimated Blood Loss:  Estimated blood loss: none.      [facility]

## 2019-11-30 NOTE — Anesthesia Postprocedure Evaluation (Signed)
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) Performed: COLONOSCOPY WITH PROPOFOL (N/A )  Patient location during evaluation: Endoscopy Anesthesia Type: General Level of consciousness: awake and alert Pain management: pain level controlled Vital Signs Assessment: post-procedure vital signs reviewed and stable Respiratory status: spontaneous breathing, nonlabored ventilation, respiratory function stable and patient connected to nasal cannula oxygen Cardiovascular status: blood pressure returned to baseline and stable Postop Assessment: no apparent nausea or vomiting Anesthetic complications: no     Last Vitals:  Vitals:   11/30/19 1042 11/30/19 1052  BP: (!) 105/58 92/69  Pulse: 70 66  Resp: 17 15  Temp: 36.8 C   SpO2: 100% 100%    Last Pain:  Vitals:   11/30/19 1052  TempSrc:   PainSc: 0-No pain                 Arita Miss

## 2019-11-30 NOTE — Anesthesia Preprocedure Evaluation (Signed)
Anesthesia Evaluation  Patient identified by MRN, date of birth, ID band Patient awake    Reviewed: Allergy & Precautions, NPO status , Patient's Chart, lab work & pertinent test results  History of Anesthesia Complications Negative for: history of anesthetic complications  Airway Mallampati: II  TM Distance: >3 FB Neck ROM: Full    Dental no notable dental hx. (+) Teeth Intact   Pulmonary neg sleep apnea, neg COPD, Patient abstained from smoking.Not current smoker,  S/p R lobectomy for lung cancer   Pulmonary exam normal breath sounds clear to auscultation       Cardiovascular Exercise Tolerance: Good METS(-) hypertension(-) CAD and (-) Past MI negative cardio ROS  (-) dysrhythmias  Rhythm:Regular Rate:Normal - Systolic murmurs    Neuro/Psych negative neurological ROS  negative psych ROS   GI/Hepatic GERD  Controlled,(+)     (-) substance abuse  ,   Endo/Other  neg diabetesHypothyroidism   Renal/GU negative Renal ROS     Musculoskeletal   Abdominal   Peds  Hematology   Anesthesia Other Findings Past Medical History: No date: Actinic keratosis No date: Arthritis No date: GERD (gastroesophageal reflux disease) No date: Hypothyroidism 2009: Lung cancer ([place])     Comment:  chemo, NO radiation. treated in [place] No date: Seborrheic dermatitis No date: Thyroid disease     Comment:  hypothyroid   Reproductive/Obstetrics                             Anesthesia Physical Anesthesia Plan  ASA: II  Anesthesia Plan: General   Post-op Pain Management:    Induction: Intravenous  PONV Risk Score and Plan: 2 and Ondansetron, Propofol infusion and TIVA  Airway Management Planned: Nasal Cannula  Additional Equipment: None  Intra-op Plan:   Post-operative Plan:   Informed Consent: I have reviewed the patients History and Physical, chart, labs and discussed the procedure  including the risks, benefits and alternatives for the proposed anesthesia with the patient or authorized representative who has indicated his/her understanding and acceptance.     Dental advisory given  Plan Discussed with: CRNA and Surgeon  Anesthesia Plan Comments: (Discussed risks of anesthesia with patient, including possibility of difficulty with spontaneous ventilation under anesthesia necessitating airway intervention, PONV, and rare risks such as cardiac or respiratory or neurological events. Patient understands.)        Anesthesia Quick Evaluation

## 2019-12-01 ENCOUNTER — Ambulatory Visit (INDEPENDENT_AMBULATORY_CARE_PROVIDER_SITE_OTHER): Payer: Medicare Other | Admitting: Dermatology

## 2019-12-01 ENCOUNTER — Encounter: Payer: Self-pay | Admitting: *Deleted

## 2019-12-01 ENCOUNTER — Encounter: Payer: Self-pay | Admitting: Gastroenterology

## 2019-12-01 DIAGNOSIS — D229 Melanocytic nevi, unspecified: Secondary | ICD-10-CM | POA: Diagnosis not present

## 2019-12-01 DIAGNOSIS — D1800 Hemangioma unspecified site: Secondary | ICD-10-CM

## 2019-12-01 DIAGNOSIS — L578 Other skin changes due to chronic exposure to nonionizing radiation: Secondary | ICD-10-CM | POA: Diagnosis not present

## 2019-12-01 DIAGNOSIS — L814 Other melanin hyperpigmentation: Secondary | ICD-10-CM

## 2019-12-01 DIAGNOSIS — K130 Diseases of lips: Secondary | ICD-10-CM | POA: Diagnosis not present

## 2019-12-01 DIAGNOSIS — L570 Actinic keratosis: Secondary | ICD-10-CM | POA: Diagnosis not present

## 2019-12-01 DIAGNOSIS — L821 Other seborrheic keratosis: Secondary | ICD-10-CM | POA: Diagnosis not present

## 2019-12-01 DIAGNOSIS — B351 Tinea unguium: Secondary | ICD-10-CM

## 2019-12-01 DIAGNOSIS — L820 Inflamed seborrheic keratosis: Secondary | ICD-10-CM | POA: Diagnosis not present

## 2019-12-01 DIAGNOSIS — Z1283 Encounter for screening for malignant neoplasm of skin: Secondary | ICD-10-CM

## 2019-12-01 LAB — SURGICAL PATHOLOGY

## 2019-12-01 NOTE — Patient Instructions (Addendum)
Recommend daily broad spectrum sunscreen SPF 30+ to sun-exposed areas, reapply every 2 hours as needed. Call for new or changing lesions.  Rec Neutrogena Sheer Zinc or Elta MD Pure (try [employer]), mineral, water resistant, zinc or titanium, 50+.  Wound Care Instructions  1. Cleanse wound gently with soap and water once a day then pat dry with clean gauze. Apply a thing coat of Petrolatum (petroleum jelly, "Vaseline") over the wound (unless you have an allergy to this). We recommend that you use a new, sterile tube of Vaseline. Do not pick or remove scabs. Do not remove the yellow or white "healing tissue" from the base of the wound.  2. Cover the wound with fresh, clean, nonstick gauze and secure with paper tape. You may use Band-Aids in place of gauze and tape if the would is small enough, but would recommend trimming much of the tape off as there is often too much. Sometimes Band-Aids can irritate the skin.  3. You should call the office for your biopsy report after 1 week if you have not already been contacted.  4. If you experience any problems, such as abnormal amounts of bleeding, swelling, significant bruising, significant pain, or evidence of infection, please call the office immediately.  5. FOR ADULT SURGERY PATIENTS: If you need something for pain relief you may take 1 extra strength Tylenol (acetaminophen) AND 2 Ibuprofen (200mg  each) together every 4 hours as needed for pain. (do not take these if you are allergic to them or if you have a reason you should not take them.) Typically, you may only need pain medication for 1 to 3 days.

## 2019-12-01 NOTE — Progress Notes (Signed)
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  Patient is here for skin cancer screening and mole check.   The following portions of the chart were reviewed this encounter and updated as appropriate: Tobacco  Allergies  Meds  Problems  Med Hx  Surg Hx  Fam Hx      Review of Systems: No other skin or systemic complaints.  Objective  Well appearing patient in no apparent distress; mood and affect are within normal limits.  A full examination was performed including scalp, head, eyes, ears, nose, lips, neck, chest, axillae, abdomen, back, buttocks, bilateral upper extremities, bilateral lower extremities, hands, feet, fingers, toes, fingernails, and toenails. All findings within normal limits unless otherwise noted below.  Objective  L forehead x 1, L temple x 1, R forearm x 1, R preauricular x 2, R temple x 1, L cheek medial x 1, L shin x 2, L med ankle x 1, R shin x 2 (12): Erythematous thin papules/macules with gritty scale.   Objective  L Neck x 1: Erythematous keratotic or waxy stuck-on papule or plaque.   Assessment & Plan    Skin cancer screening performed today.  Actinic Damage - diffuse scaly erythematous macules with underlying dyspigmentation - Recommend daily broad spectrum sunscreen SPF 30+ to sun-exposed areas, reapply every 2 hours as needed.  - Call for new or changing lesions.  Melanocytic Nevi - Tan-brown and/or pink-flesh-colored symmetric macules and papules - Benign appearing on exam today - Observation - Call clinic for new or changing moles - Recommend daily use of broad spectrum spf 30+ sunscreen to sun-exposed areas.   Seborrheic Keratoses - Stuck-on, waxy, tan-brown papules and plaques  - Discussed benign etiology and prognosis. - Observe - Call for any changes  Hemangiomas - Red papules - Discussed  benign nature - Observe - Call for any changes  Lentigines - Scattered tan macules - Discussed due to sun exposure - Benign, observe - Call for any changes  AK (actinic keratosis) (12) L forehead x 1, L temple x 1, R forearm x 1, R preauricular x 2, R temple x 1, L cheek medial x 1, L shin x 2, L med ankle x 1, R shin x 2  Prior to procedure, discussed risks of blister formation, small wound, skin dyspigmentation, or rare scar following cryotherapy.    Destruction of lesion - L forehead x 1, L temple x 1, R forearm x 1, R preauricular x 2, R temple x 1, L cheek medial x 1, L shin x 2, L med ankle x 1, R shin x 2  Destruction method: cryotherapy   Informed consent: discussed and consent obtained   Lesion destroyed using liquid nitrogen: Yes   Cryotherapy cycles:  2 Outcome: patient tolerated procedure well with no complications   Post-procedure details: wound care instructions given    Cheilitis Lips  Cont Dr. Luvenia Heller cortibalm (contains 1% hydrocortisone) and/or Aquaphor prn  Inflamed seborrheic keratosis L Neck x 1  Prior to procedure, discussed risks of blister formation, small wound, skin dyspigmentation, or rare scar following cryotherapy.    Destruction of lesion - L Neck x 1  Destruction method: cryotherapy   Informed consent: discussed and consent obtained   Lesion destroyed using liquid nitrogen: Yes   Cryotherapy cycles:  2 Outcome: patient tolerated procedure well with no complications  Post-procedure details: wound care instructions given    Onychomycosis Toe Nail  Patient planning to see podiatrist to be evaluated for laser treatment.   Actinic Damage - diffuse scaly erythematous macules with underlying dyspigmentation - Recommend daily broad spectrum sunscreen SPF 30+ to sun-exposed areas, reapply every 2 hours as needed.  - Call for new or changing lesions.  Return in about 1 year (around 11/30/2020) for 4-6 months recheck AK's, 1 year for TBSE.    Graciella Belton, RMA, am acting as scribe for Forest Gleason, MD .  Documentation: I have reviewed the above documentation for accuracy and completeness, and I agree with the above.  Forest Gleason, MD

## 2019-12-06 ENCOUNTER — Ambulatory Visit
Admission: RE | Admit: 2019-12-06 | Discharge: 2019-12-06 | Disposition: A | Discharge status: Home / Self Care | Payer: Medicare Other | Source: Ambulatory Visit | Attending: Family | Admitting: Family

## 2019-12-06 ENCOUNTER — Other Ambulatory Visit: Payer: Self-pay

## 2019-12-06 ENCOUNTER — Encounter: Payer: Self-pay | Admitting: Family

## 2019-12-06 DIAGNOSIS — E785 Hyperlipidemia, unspecified: Secondary | ICD-10-CM | POA: Diagnosis not present

## 2019-12-06 DIAGNOSIS — I6521 Occlusion and stenosis of right carotid artery: Secondary | ICD-10-CM | POA: Diagnosis not present

## 2019-12-06 IMAGING — US US CAROTID DUPLEX BILAT
1 series · 13 of 24 positions shown · non-contrast
Comparison: None.

CLINICAL DATA: Hyperlipidemia.

EXAM:
BILATERAL CAROTID DUPLEX ULTRASOUND
TECHNIQUE: Gray scale imaging, color Doppler and duplex ultrasound were
performed of bilateral carotid and vertebral arteries in the neck.

[Series 1: us carotid duplex bilat · 0.06mm/px · 13 of 66 slices shown]
[im 1/66]
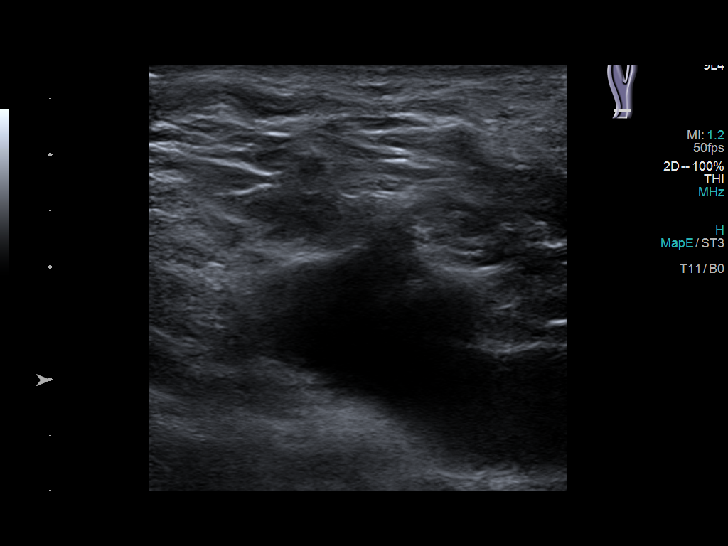
[im 6/66]
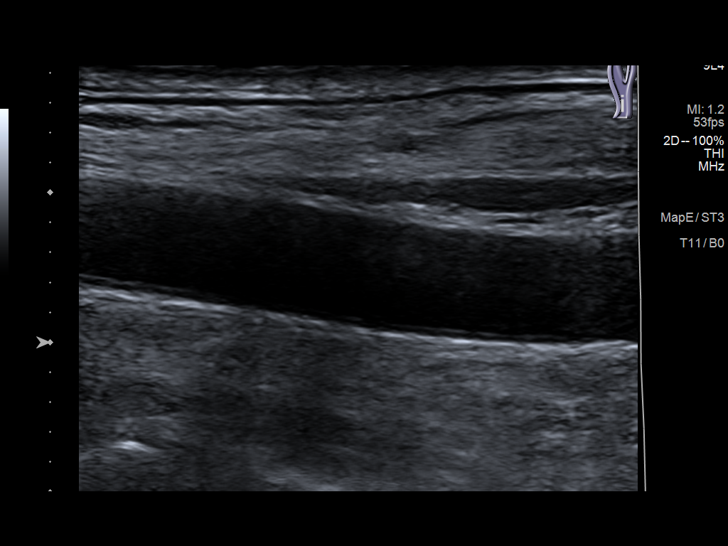
[im 12/66]
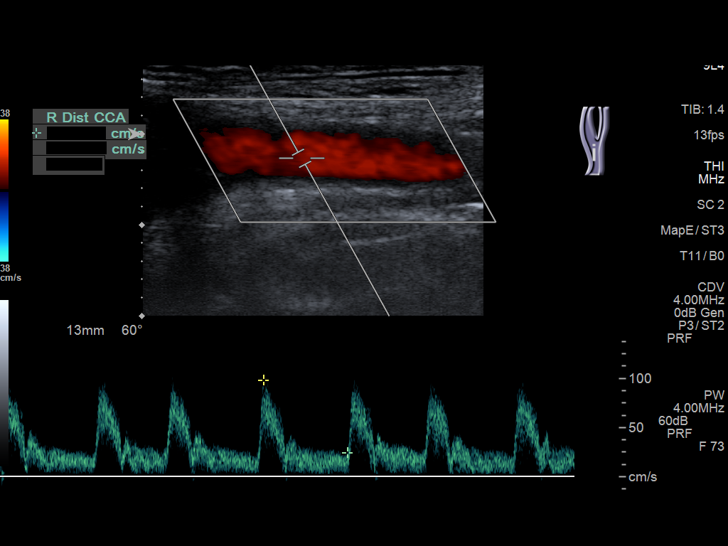
[im 17/66]
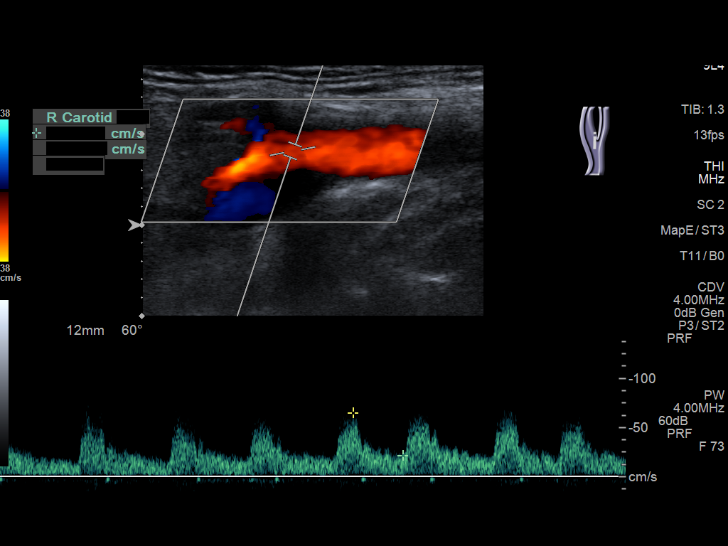
[im 23/66]
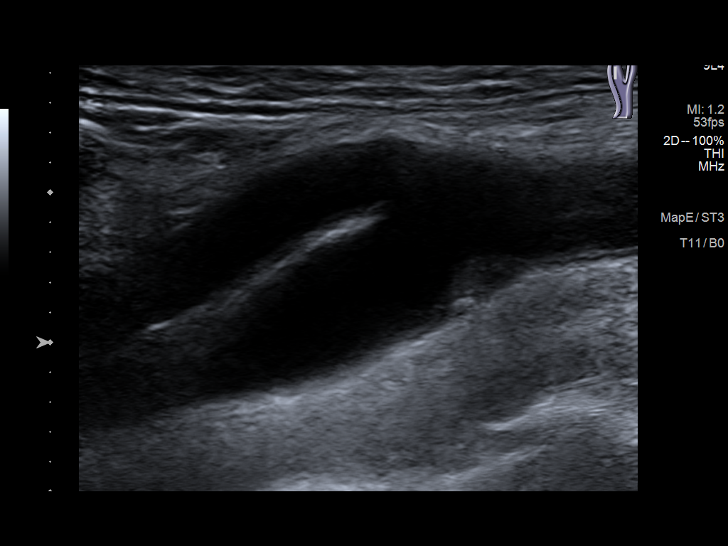
[im 29/66]
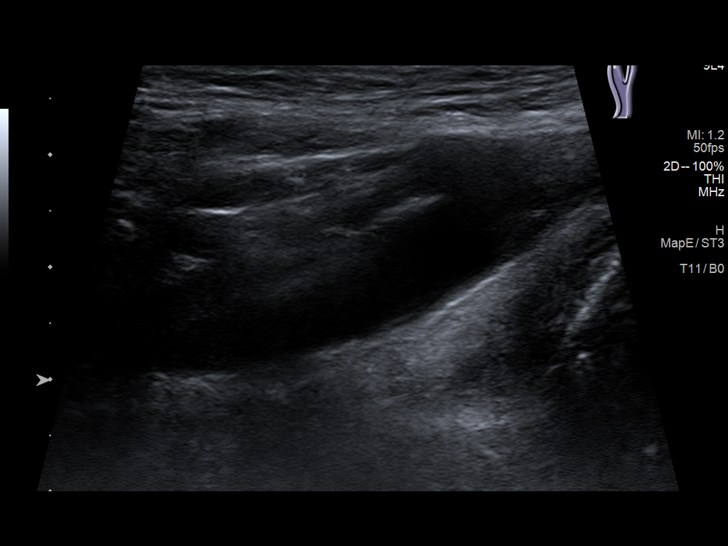
[im 34/66]
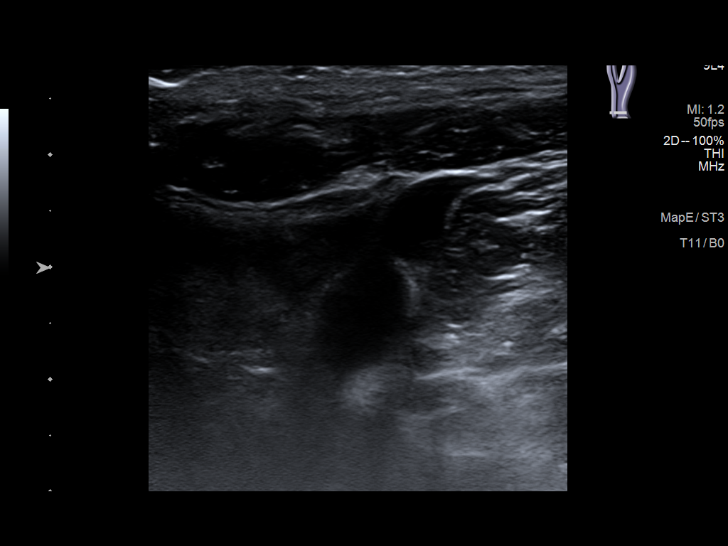
[im 37/66]
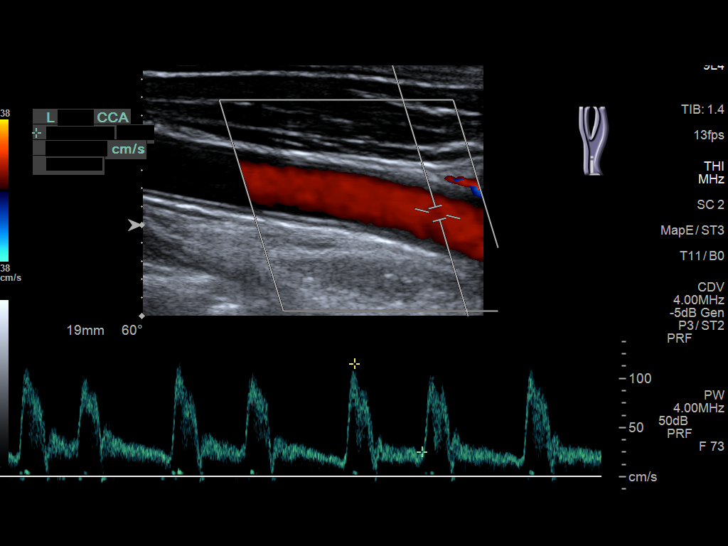
[im 43/66]
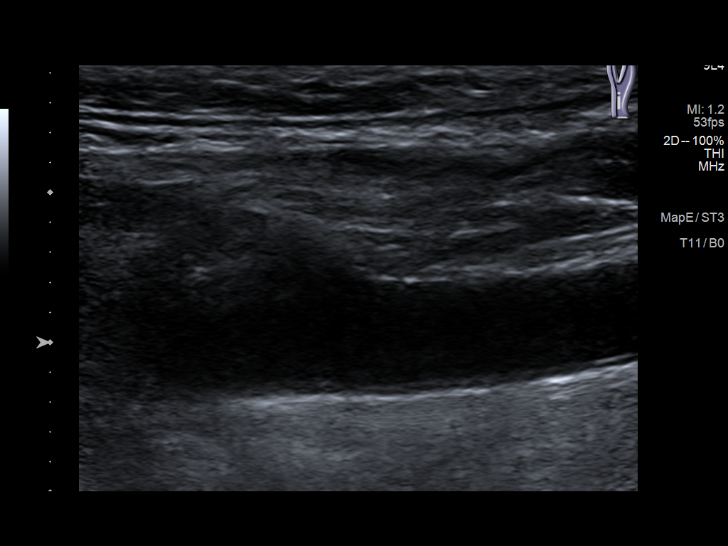
[im 49/66]
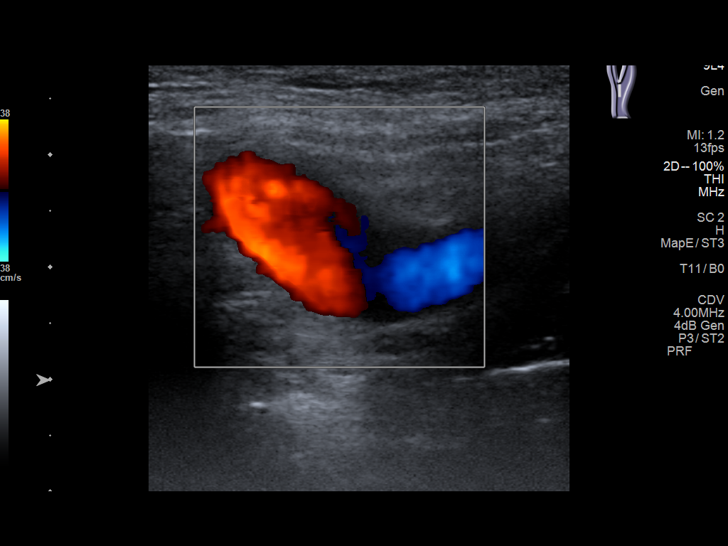
[im 54/66]
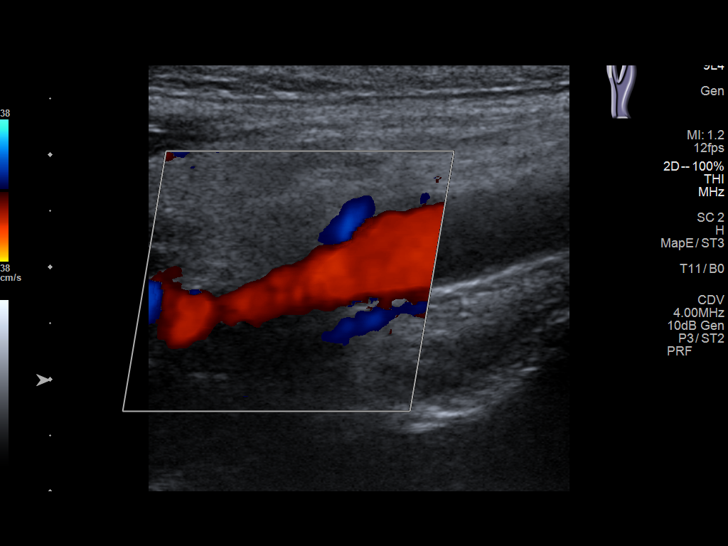
[im 60/66]
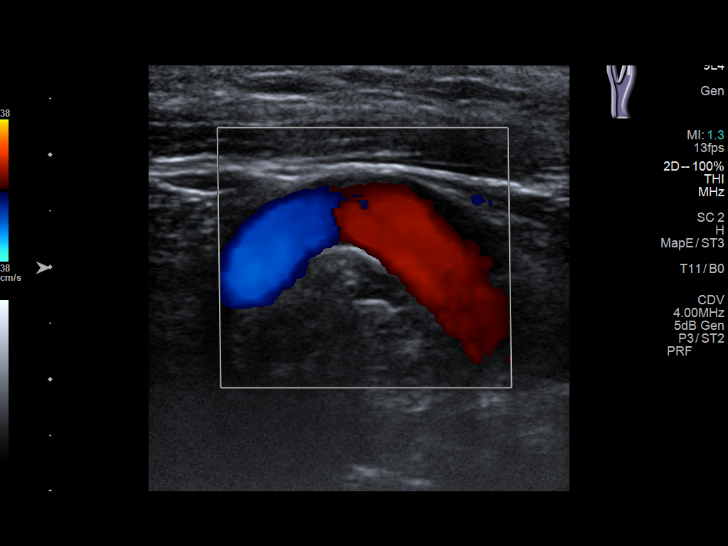
[im 66/66]
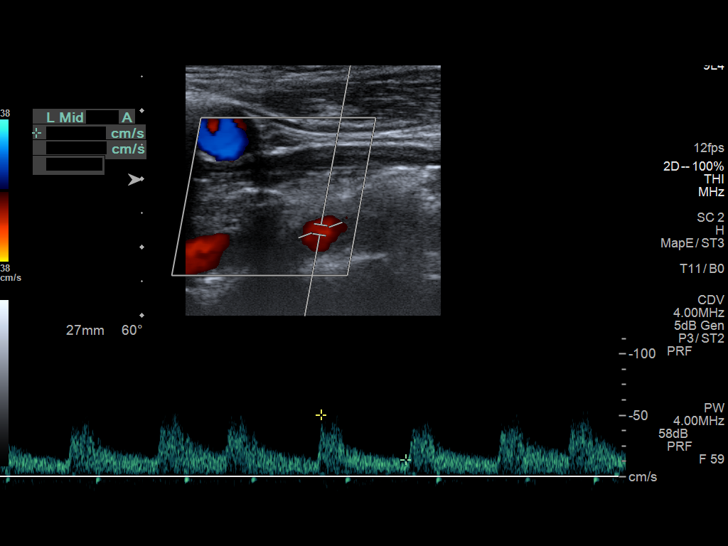

[13 of 24 positions shown; findings below may reference images not displayed]

FINDINGS: Criteria: Quantification of carotid stenosis is based on velocity
parameters that correlate the residual internal carotid diameter
with NASCET-based stenosis levels, using the diameter of the distal
internal carotid lumen as the denominator for stenosis measurement.

The following velocity measurements were obtained:

RIGHT

ICA: 104/17 cm/sec

CCA: 98/22 cm/sec

SYSTOLIC ICA/CCA RATIO:

ECA: 114 cm/sec

LEFT

ICA: 100/20 cm/sec

CCA: 91/24 cm/sec

SYSTOLIC ICA/CCA RATIO:

ECA: 89 cm/sec

RIGHT CAROTID ARTERY: There is a minimal amount of eccentric
hypoechoic plaque within the right carotid bulb (image 16),
extending to involve the origin of the right internal carotid artery
(image 24), not resulting in elevated peak systolic velocities
within the interrogated course of the right internal carotid artery
to suggest a hemodynamically significant stenosis.

RIGHT VERTEBRAL ARTERY:  Antegrade flow

LEFT CAROTID ARTERY: There is no grayscale evidence of significant
intimal thickening or atherosclerotic plaque affecting the
interrogated portions of the left carotid system. There are no
elevated peak systolic velocities within the interrogated course of
the left internal carotid artery to suggest a hemodynamically
significant stenosis.

LEFT VERTEBRAL ARTERY:  Antegrade flow
IMPRESSION: 1. Minimal amount of right-sided atherosclerotic plaque, not
resulting in a hemodynamically significant stenosis.
2. Normal sonographic evaluation of the left carotid system.

## 2019-12-09 ENCOUNTER — Other Ambulatory Visit: Payer: Medicare Other

## 2019-12-13 ENCOUNTER — Other Ambulatory Visit (INDEPENDENT_AMBULATORY_CARE_PROVIDER_SITE_OTHER): Payer: Medicare Other

## 2019-12-13 ENCOUNTER — Encounter: Payer: Self-pay | Admitting: Dermatology

## 2019-12-13 ENCOUNTER — Other Ambulatory Visit: Payer: Self-pay

## 2019-12-13 DIAGNOSIS — E785 Hyperlipidemia, unspecified: Secondary | ICD-10-CM

## 2019-12-15 LAB — COMPREHENSIVE METABOLIC PANEL
ALT: 18 U/L (ref 0–53)
AST: 23 U/L (ref 0–37)
Albumin: 4.1 g/dL (ref 3.5–5.2)
Alkaline Phosphatase: 46 U/L (ref 39–117)
BUN: 21 mg/dL (ref 6–23)
CO2: 32 mEq/L (ref 19–32)
Calcium: 9.2 mg/dL (ref 8.4–10.5)
Chloride: 106 mEq/L (ref 96–112)
Creatinine, Ser: 1.03 mg/dL (ref 0.40–1.50)
GFR: 69.93 mL/min (ref >60.00)
Glucose, Bld: 69 mg/dL — ABNORMAL LOW (ref 70–99)
Potassium: 4.8 mEq/L (ref 3.5–5.1)
Sodium: 144 mEq/L (ref 135–145)
Total Bilirubin: 0.2 mg/dL (ref 0.2–1.2)
Total Protein: 6.5 g/dL (ref 6.0–8.3)

## 2019-12-21 DIAGNOSIS — H2513 Age-related nuclear cataract, bilateral: Secondary | ICD-10-CM | POA: Diagnosis not present

## 2019-12-28 ENCOUNTER — Other Ambulatory Visit: Payer: Self-pay

## 2019-12-28 ENCOUNTER — Ambulatory Visit (INDEPENDENT_AMBULATORY_CARE_PROVIDER_SITE_OTHER): Payer: Medicare Other | Admitting: Podiatry

## 2019-12-28 ENCOUNTER — Encounter: Payer: Self-pay | Admitting: Podiatry

## 2019-12-28 DIAGNOSIS — L603 Nail dystrophy: Secondary | ICD-10-CM | POA: Diagnosis not present

## 2019-12-28 DIAGNOSIS — M79676 Pain in unspecified toe(s): Secondary | ICD-10-CM

## 2019-12-28 DIAGNOSIS — B351 Tinea unguium: Secondary | ICD-10-CM

## 2019-12-28 NOTE — Progress Notes (Signed)
He presents today concerned about thickness of his toenails he states that they have been like this for many years he would like to consider having them fixed.  Objective: Vital signs are stable he is alert and oriented x3 toenails are long thick yellow possibly mycotic but definitely dystrophic.  Pulses remain strong and palpable.  No open lesions or wounds.  Assessment: Cannot rule out onychomycosis or nail dystrophy.  Plan: Samples of the nails were taken today to be sent for pathologic evaluation we will follow-up with him in 1 month for detailed treatment plan.  He is interested in laser therapy.

## 2020-01-07 ENCOUNTER — Encounter: Payer: Self-pay | Admitting: Podiatry

## 2020-01-11 ENCOUNTER — Other Ambulatory Visit: Payer: Self-pay

## 2020-01-11 DIAGNOSIS — M1990 Unspecified osteoarthritis, unspecified site: Secondary | ICD-10-CM

## 2020-01-12 ENCOUNTER — Telehealth: Payer: Self-pay | Admitting: Family

## 2020-01-12 NOTE — Telephone Encounter (Signed)
Patient want to see if Kevin Terrell would ok his meloxicam (MOBIC) 15 MG tablet, 90 day supply, so it can be packaged into bubble packs. Kevin Terrell is waiting for office reply.

## 2020-01-13 ENCOUNTER — Other Ambulatory Visit: Payer: Self-pay | Admitting: Family

## 2020-01-13 ENCOUNTER — Telehealth: Payer: Self-pay | Admitting: Family

## 2020-01-13 DIAGNOSIS — M1990 Unspecified osteoarthritis, unspecified site: Secondary | ICD-10-CM

## 2020-01-13 MED ORDER — MELOXICAM 15 MG PO TABS: 15.0000 mg | ORAL_TABLET | Freq: Every day | ORAL | 1 refills | Status: DC

## 2020-01-13 NOTE — Telephone Encounter (Signed)
Spoke with pt during wife's visit today in regards to mobic Refilled Advised to use half tablet 7.5mg  mobic prn and to take with food.  Advised to LIMIT use and to use topicals for knee pain

## 2020-01-25 ENCOUNTER — Encounter: Payer: Self-pay | Admitting: Podiatry

## 2020-01-25 ENCOUNTER — Other Ambulatory Visit: Payer: Self-pay

## 2020-01-25 ENCOUNTER — Ambulatory Visit (INDEPENDENT_AMBULATORY_CARE_PROVIDER_SITE_OTHER): Payer: Medicare Other | Admitting: Podiatry

## 2020-01-25 DIAGNOSIS — M79676 Pain in unspecified toe(s): Secondary | ICD-10-CM

## 2020-01-25 DIAGNOSIS — B351 Tinea unguium: Secondary | ICD-10-CM

## 2020-01-25 NOTE — Progress Notes (Signed)
He presents today for follow-up of his pathology results for his toenails.  He spoke with Caryl Pina for considerable amount of time during the intake regarding laser therapy.  She has a laser tech who does the procedures.  She explained to him very thoroughly from cost to the treatment plan.  Objective: Vital signs are stable he is alert and oriented x3.  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  Assessment: Nail dystrophy onychomycosis.  Plan: We discussed oral therapy topical therapy and laser therapy.  Currently he would like to start laser therapy as soon as possible with Caryl Pina at the [place] office.  We did discuss the pros and cons of this the efficacy of this as opposed to oral medications and I will follow-up with him in a few months.

## 2020-02-02 ENCOUNTER — Other Ambulatory Visit: Payer: Self-pay

## 2020-02-02 ENCOUNTER — Ambulatory Visit (INDEPENDENT_AMBULATORY_CARE_PROVIDER_SITE_OTHER): Payer: Medicare Other | Admitting: Internal Medicine

## 2020-02-02 ENCOUNTER — Telehealth: Payer: Self-pay | Admitting: Family

## 2020-02-02 ENCOUNTER — Encounter: Payer: Self-pay | Admitting: Internal Medicine

## 2020-02-02 VITALS — BP 110/80 | HR 70 | Ht 73.0 in | Wt 185.1 lb

## 2020-02-02 DIAGNOSIS — I700 Atherosclerosis of aorta: Secondary | ICD-10-CM | POA: Diagnosis not present

## 2020-02-02 DIAGNOSIS — E7800 Pure hypercholesterolemia, unspecified: Secondary | ICD-10-CM

## 2020-02-02 DIAGNOSIS — M25562 Pain in left knee: Secondary | ICD-10-CM

## 2020-02-02 NOTE — Telephone Encounter (Signed)
Patient would like a referral for his knee.

## 2020-02-02 NOTE — Progress Notes (Signed)
Follow-up Outpatient Visit Date: 02/02/2020  Primary Care Provider: Burnard Hawthorne, FNP [address] [place] [address]  Chief Complaint: Follow-up hyperlipidemia  HPI:  Kevin Terrell is a 79 y.o. male with history of hyperlipidemia, lung cancer status post right upper lobectomy and chemotherapy, hypothyroidism, GERD, arthritis, and peripheral neuropathy, who presents for follow-up of hyperlipidemia.  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  Given a 10-year ASCVD risk of 25.6%, we agreed to continue with recently added rosuvastatin.  Subsequent carotid Dopplers showed minimal plaquing in the right carotid artery without significant stenosis.  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  He has been using a stationary bike at [facility] regularly.  He has noticed more knee pain associated with this but otherwise has been asymptomatic.  He denies chest pain, shortness of breath, and edema.  He reports rare isolated skipped beats that he attributes to PVC's.  He is tolerating rosuvastatin well without side effects.  He reports that follow-up lipid panel has been scheduled for 1 month from now by Ms. Arnett.  --------------------------------------------------------------------------------------------------  Cardiovascular History & Procedures: Cardiovascular Problems:  Hyperlipidemia  Risk Factors:  Hyperlipidemia, male gender, and age > 44  Cath/PCI:  None  CV Surgery:  None  EP Procedures and Devices:  None  Non-Invasive Evaluation(s):  Carotid Dopplers (12/06/2019): Minimal atherosclerotic plaquing without stenosis involving the right carotid artery.  Normal left carotid artery.  Recent CV Pertinent Labs: Lab Results  Component Value Date   CHOL 175 10/21/2019   HDL 46.10 10/21/2019   LDLCALC 113 (H) 10/21/2019   TRIG 78.0 10/21/2019   CHOLHDL 4 10/21/2019   K 4.8 12/13/2019   BUN 21 12/13/2019   CREATININE 1.03 12/13/2019    Past medical and surgical history were reviewed and updated in EPIC.  Current Meds  Medication Sig  . albuterol (VENTOLIN HFA) 108 (90 Base) MCG/ACT inhaler as needed.   . cetirizine (ZYRTEC) 10 MG tablet Take 10 mg by mouth daily.  . cholecalciferol (VITAMIN D3) 25 MCG (1000 UT) tablet Take 1,000 Units by mouth daily.  Marland Kitchen gabapentin (NEURONTIN) 300 MG capsule Take 300 mg by mouth 2 (two) times daily.  Marland Kitchen ketoconazole (NIZORAL) 2 % cream Apply  a small amount to affected area twice a day  . levothyroxine (SYNTHROID) 50 MCG tablet TAKE 1 TABLET BY MOUTH ONCE A DAY BEFOREBREAKFAST.  Marland Kitchen loperamide (IMODIUM) 2 MG capsule loperamide 2 mg capsule  . meloxicam (MOBIC) 15 MG tablet Take 1 tablet (15 mg total) by mouth daily.  . montelukast (SINGULAIR) 10 MG tablet TAKE 1 TABLET BY MOUTH EVERY NIGHT AT BEDTIME  . Multiple Vitamin (MULTIVITAMIN WITH MINERALS) TABS tablet Take 1 tablet by mouth daily.  Marland Kitchen nystatin cream (MYCOSTATIN) nystatin 100,000 unit/gram topical cream  . Olopatadine HCl 0.2 % SOLN as needed.   . Probiotic Product (PROBIOTIC-10) CAPS Take by mouth.  . rosuvastatin (CRESTOR) 10 MG tablet Take 1 tablet (10 mg total) by mouth daily.  Marland Kitchen triamcinolone (KENALOG) 0.025 % ointment triamcinolone acetonide 0.025 % topical ointment    Allergies: Compazine [prochlorperazine edisylate] and Other  Social History   Tobacco Use  . Smoking status: Never Smoker  . Smokeless tobacco: Never Used  Substance Use Topics  . Alcohol use: Yes    Comment: Once glass of wine per month, none last 24hrs  . Drug use: Never    Family History  Problem Relation Age of Onset  .  Cancer Mother   . Alzheimer's disease Mother   . Stroke Maternal Grandmother   . Hypertension Father   . Alzheimer's disease Father     Review of Systems: A 12-system review of systems was performed and was negative except as noted in the  HPI.  --------------------------------------------------------------------------------------------------  Physical Exam: BP 110/80 (BP Location: Left Arm, Patient Position: Sitting, Cuff Size: Normal)   Pulse 70   Ht 6' 1"  (1.854 m)   Wt 185 lb 2 oz (84 kg)   SpO2 98%   BMI 24.42 kg/m   General:  NAD. Neck: No JVD or HJR. Lungs: CTA bilaterally. Heart: RRR w/o murmurs, rubs, or gallops. Abd: Soft, NT/ND. Ext: No Le edema.  EKG:  NSR without abnormality.  Lab Results  Component Value Date   WBC 4.5 10/21/2019   HGB 13.1 10/21/2019   HCT 39.3 10/21/2019   MCV 91.5 10/21/2019   PLT 162.0 10/21/2019    Lab Results  Component Value Date   NA 144 12/13/2019   K 4.8 12/13/2019   CL 106 12/13/2019   CO2 32 12/13/2019   BUN 21 12/13/2019   CREATININE 1.03 12/13/2019   GLUCOSE 69 (L) 12/13/2019   ALT 18 12/13/2019    Lab Results  Component Value Date   CHOL 175 10/21/2019   HDL 46.10 10/21/2019   LDLCALC 113 (H) 10/21/2019   TRIG 78.0 10/21/2019   CHOLHDL 4 10/21/2019    --------------------------------------------------------------------------------------------------  ASSESSMENT AND PLAN: Hyperlipidemia: 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  LFT's in April were normal.  Follow-up lipid panel has been ordered by Ms. Arnett.  I will not make any medication changes and will defer ongoing management to Ms. Arnett.  Aortic atherosclerosis: Recent chest CT notable for scattered atherosclerotic calcifications in the thoracic aorta as well as coronary artery calcification.  I agree with continued risk factor modification, including regular exercise, as well as statin therapy to target an LDL < 100.  Follow-up: Return to clinic in 1 year.  Nelva Bush, MD 02/02/2020 11:14 AM

## 2020-02-02 NOTE — Patient Instructions (Signed)
Medication Instructions:  Your physician recommends that you continue on your current medications as directed. Please refer to the Current Medication list given to you today.  *If you need a refill on your cardiac medications before your next appointment, please call your pharmacy*   Follow-Up: At [facility], you and your health needs are our priority.  As part of our continuing mission to provide you with exceptional heart care, we have created designated Provider Care Teams.  These Care Teams include your primary Cardiologist (physician) and Advanced Practice Providers (APPs -  Physician Assistants and Nurse Practitioners) who all work together to provide you with the care you need, when you need it.  We recommend signing up for the patient portal called "MyChart".  Sign up information is provided on this After Visit Summary.  MyChart is used to connect with patients for Virtual Visits (Telemedicine).  Patients are able to view lab/test results, encounter notes, upcoming appointments, etc.  Non-urgent messages can be sent to your provider as well.   To learn more about what you can do with MyChart, go to [email or web address].    Your next appointment:   12 month(s)  The format for your next appointment:   In Person  Provider:    You may see DR Harrell Gave END or one of the following Advanced Practice Providers on your designated Care Team:    Murray Hodgkins, NP  Christell Faith, PA-C  Marrianne Mood, PA-C

## 2020-02-03 NOTE — Telephone Encounter (Signed)
Call pt Ref placed Let us know if you dont hear back within a week in regards to an appointment being scheduled.

## 2020-02-03 NOTE — Telephone Encounter (Signed)
I spoke with patient & he has a hx of osteoarthritis in both knees. He recently has an over use injury from riding indoor bike at the gym. The left knee if very painful & wanted referral to ortho or MRI done then referral to ortho. He said in the past he usually has just seen ortho first. Can referral be placed?

## 2020-02-03 NOTE — Telephone Encounter (Signed)
Patient advised to let us know if she does not hear back within a week about an appointment.

## 2020-02-07 ENCOUNTER — Ambulatory Visit: Payer: Medicare Other

## 2020-02-13 ENCOUNTER — Other Ambulatory Visit: Payer: Self-pay | Admitting: Family

## 2020-02-17 ENCOUNTER — Other Ambulatory Visit: Payer: Medicare Other

## 2020-02-17 ENCOUNTER — Other Ambulatory Visit: Payer: Self-pay | Admitting: Sports Medicine

## 2020-02-17 DIAGNOSIS — M899 Disorder of bone, unspecified: Secondary | ICD-10-CM

## 2020-02-17 DIAGNOSIS — M25561 Pain in right knee: Secondary | ICD-10-CM

## 2020-03-01 ENCOUNTER — Other Ambulatory Visit: Payer: Self-pay

## 2020-03-01 ENCOUNTER — Ambulatory Visit
Admission: RE | Admit: 2020-03-01 | Discharge: 2020-03-01 | Disposition: A | Discharge status: Home / Self Care | Payer: Medicare Other | Source: Ambulatory Visit | Attending: Sports Medicine | Admitting: Sports Medicine

## 2020-03-01 DIAGNOSIS — M25562 Pain in left knee: Secondary | ICD-10-CM | POA: Diagnosis present

## 2020-03-01 DIAGNOSIS — M899 Disorder of bone, unspecified: Secondary | ICD-10-CM | POA: Diagnosis present

## 2020-03-01 DIAGNOSIS — M25561 Pain in right knee: Secondary | ICD-10-CM

## 2020-03-01 DIAGNOSIS — M24152 Other articular cartilage disorders, left hip: Secondary | ICD-10-CM | POA: Diagnosis not present

## 2020-03-01 IMAGING — MR MR FEMUR*L* W/O CM
7 series · 33 of 40 positions shown · non-contrast
Comparison: None.

CLINICAL DATA: No known injury, possible bone lesion in the left
distal femur along the medial aspect on recent knee x-ray.

EXAM:
MR OF THE LEFT FEMUR WITHOUT CONTRAST
TECHNIQUE: Multiplanar, multisequence MR imaging of the left femur was
performed. No intravenous contrast was administered.

[Series 6: composed cor t1_comp · coronal · left · 5.6mm · 1.08mm/px · 4 of 34 slices shown]
[im 1/34]
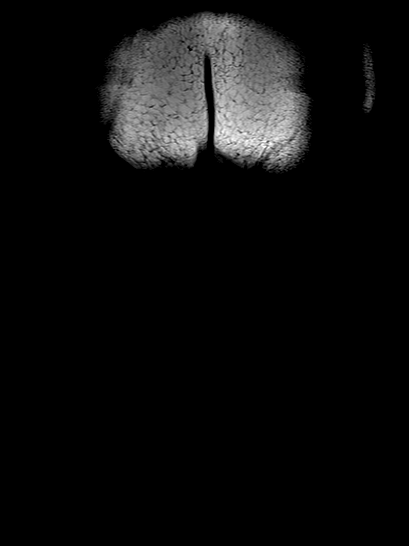
[im 12/34]
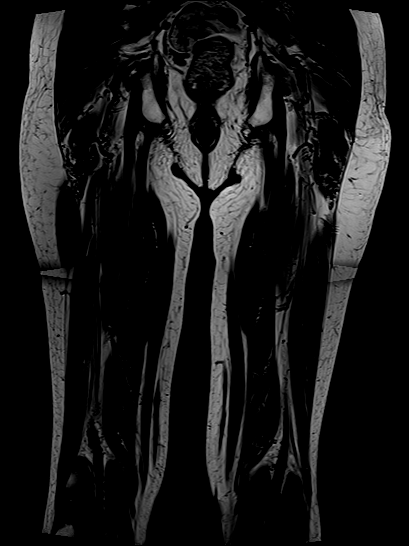
[im 23/34]
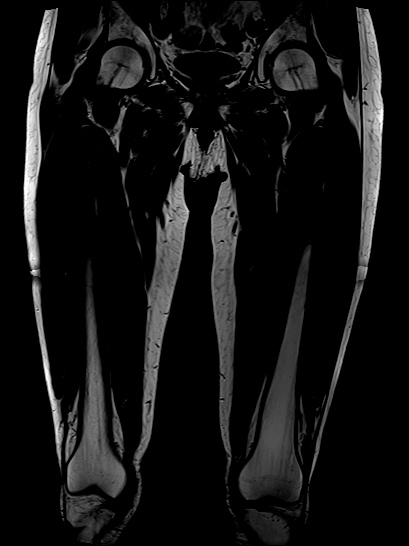
[im 34/34]
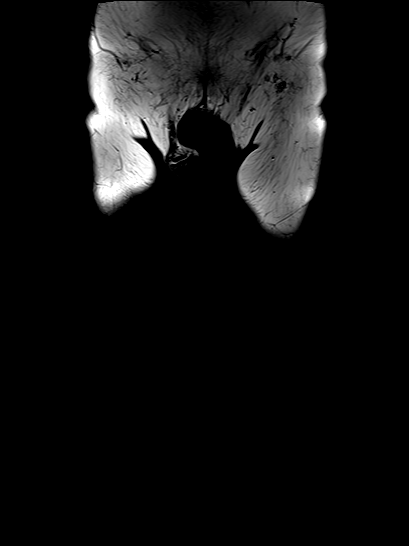

[Series 7: composed cor t1_comp_filt · coronal · left · 5.6mm · 1.08mm/px · 4 of 34 slices shown]
[im 1/34]
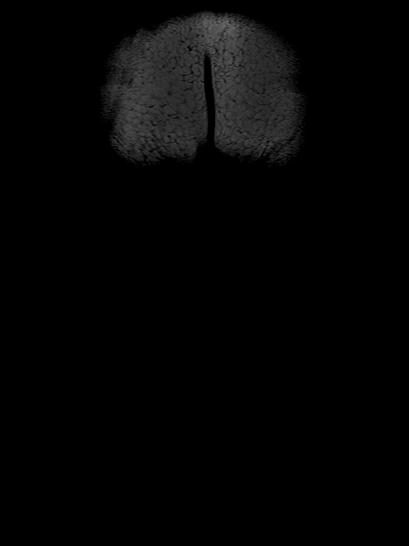
[im 12/34]
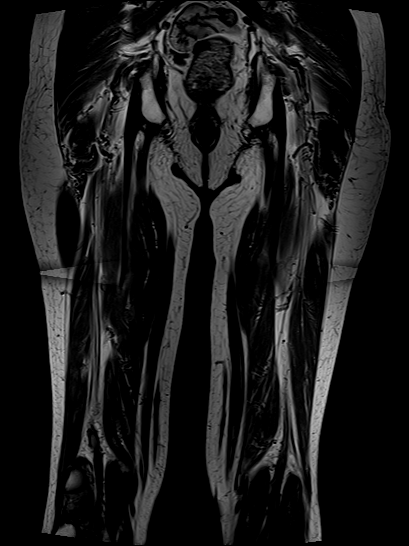
[im 23/34]
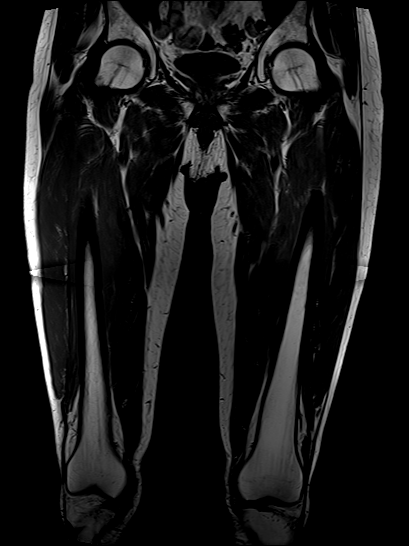
[im 34/34]
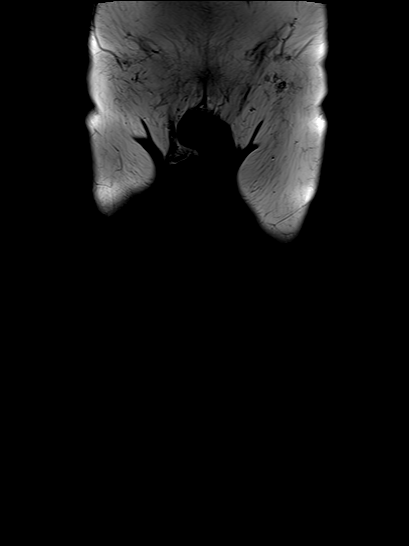

[Series 10: composed cor stir_comp · coronal · left · 5.6mm · 1.08mm/px · 4 of 34 slices shown]
[im 1/34]
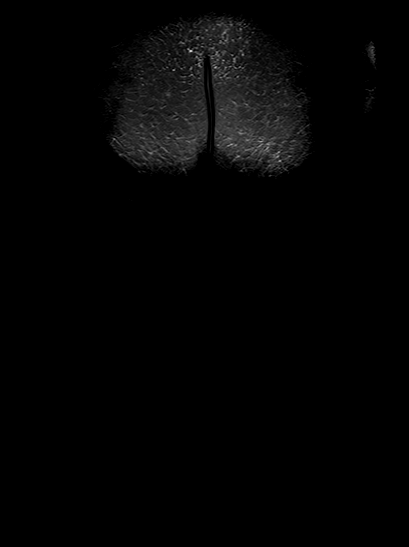
[im 12/34]
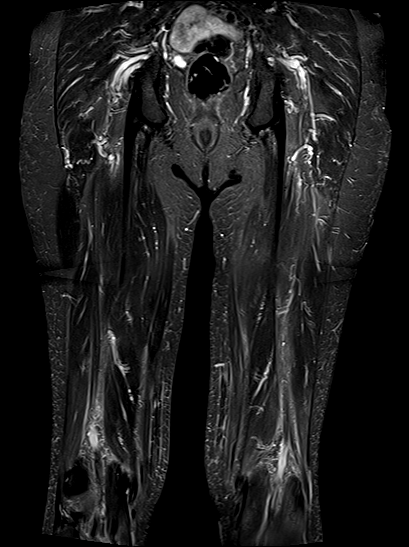
[im 23/34]
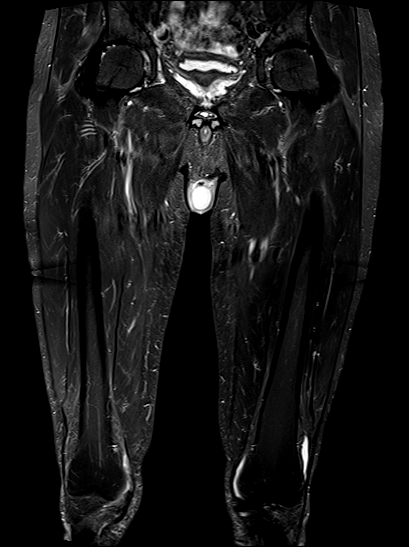
[im 34/34]
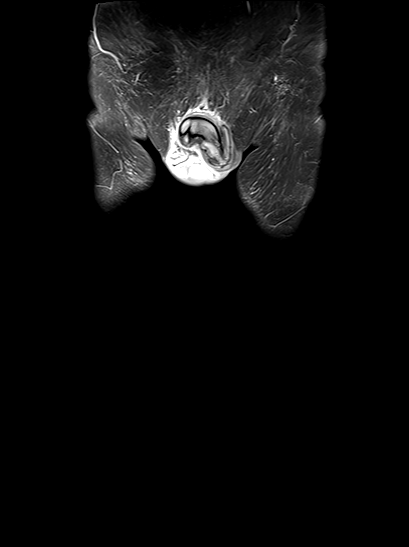

[Series 11: composed cor stir_comp_filt · coronal · left · 5.6mm · 1.08mm/px · 4 of 34 slices shown]
[im 1/34]
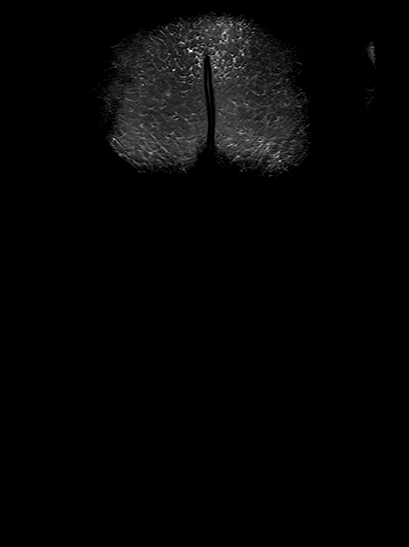
[im 12/34]
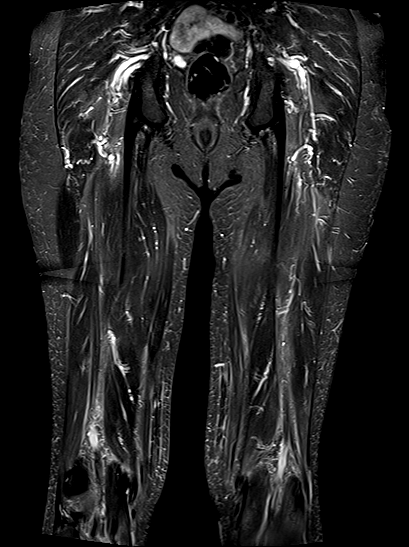
[im 23/34]
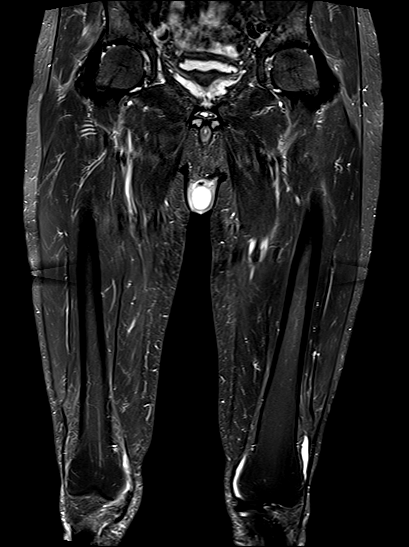
[im 34/34]
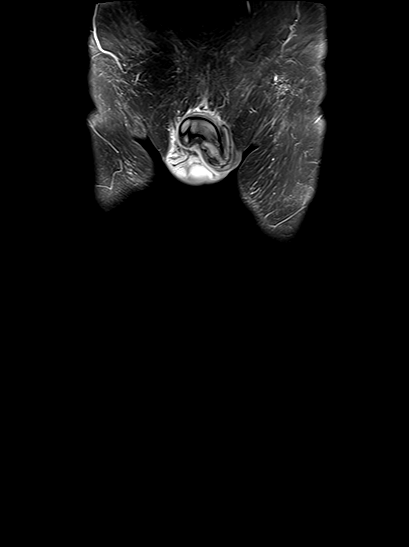

[Series 17: ax t1_comp · axial · left · 5.0mm · 0.86mm/px · z∈[-440,+112]mm · 8 of 92 slices shown]
[im 1/92]
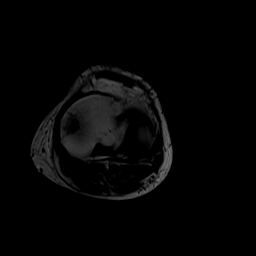
[im 11/92]
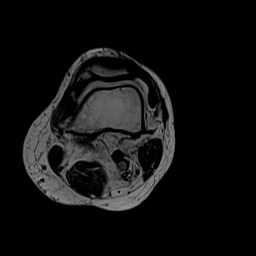
[im 31/92]
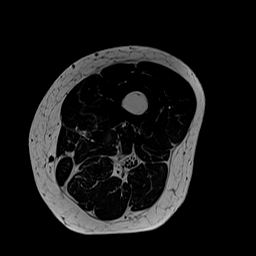
[im 41/92]
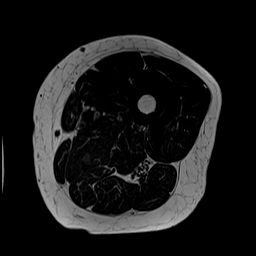
[im 51/92]
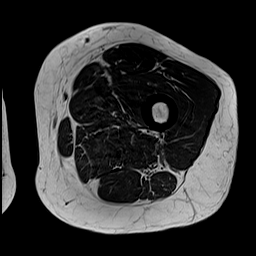
[im 61/92]
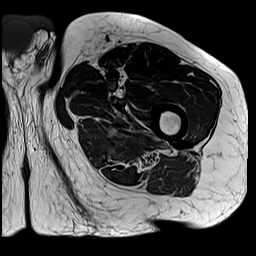
[im 81/92]
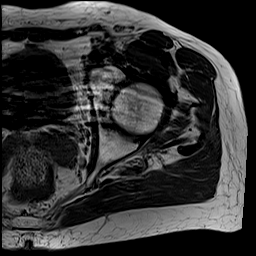
[im 92/92]
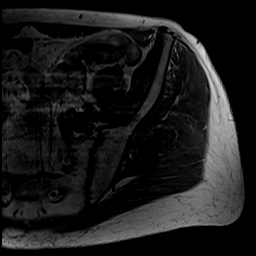

[Series 20: T2 · axial · left · 5.0mm · 0.86mm/px · z∈[-440,+112]mm · 8 of 91 slices shown]
[im 1/91]
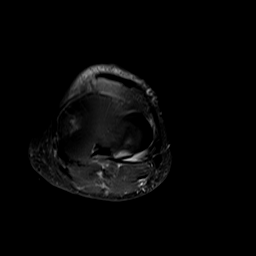
[im 11/91]
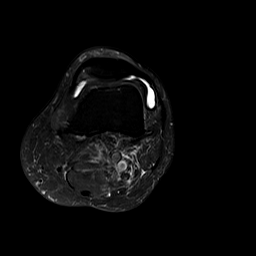
[im 31/91]
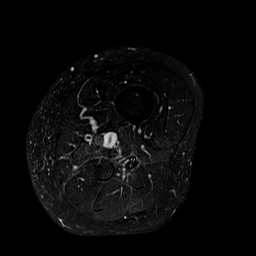
[im 41/91]
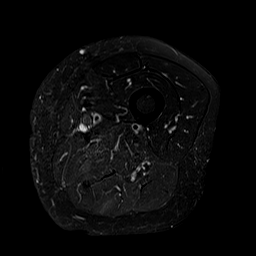
[im 51/91]
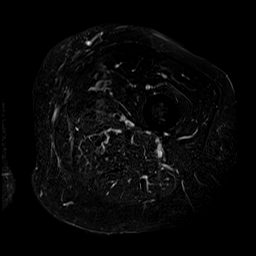
[im 61/91]
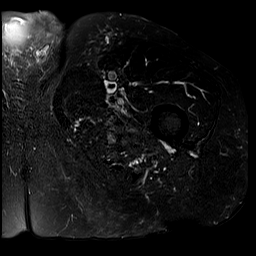
[im 81/91]
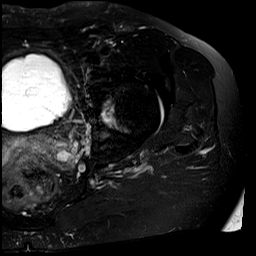
[im 91/91]
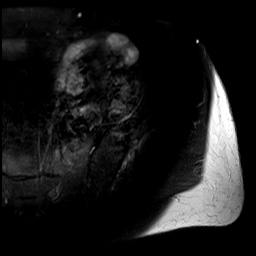

[Series 23: t2_tse_sag fs_comp · sagittal · left · 5.6mm · 0.94mm/px · 1 of 36 slices shown]
[im 1/36]
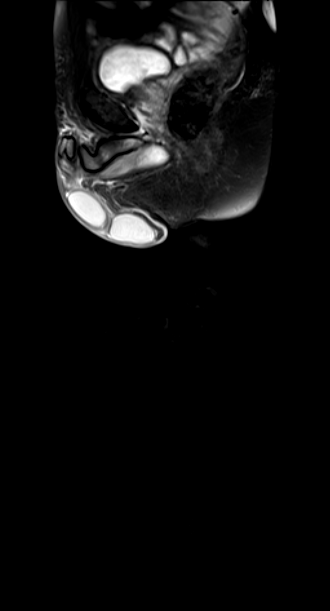

[33 of 40 positions shown; findings below may reference images not displayed]

FINDINGS: Bones/Joint/Cartilage

Subtle sclerotic bone lesion in the medial distal femoral
diametaphysis without periosteal reaction, bone destruction or
associated soft tissue mass. The appearance is most compatible with
a benign ossified fibrous lesion.

No fracture or dislocation. Normal alignment. No joint effusion.

High-grade partial-thickness cartilage loss with areas of
full-thickness cartilage loss of the medial femorotibial compartment
bilaterally with mild subchondral marrow edema in the medial tibial
plateau bilaterally. Areas of full-thickness cartilage loss of the
left medial patellar facet.

Ligaments

Collateral ligaments are intact.

Muscles and Tendons
No fluid collection or hematoma. Muscles are normal. Visualized
tendons are intact.

Soft tissue
No fluid collection or hematoma.  No soft tissue mass.
IMPRESSION: 1. Subtle sclerotic nonaggressive bone lesion in the medial distal
femoral diametaphysis without periosteal reaction, bone destruction
or associated soft tissue mass. The appearance is most compatible
with a benign ossified fibrous lesion. No further evaluation is
recommended.
2. High-grade partial-thickness cartilage loss with areas of
full-thickness cartilage loss of the medial femorotibial compartment
bilaterally. Areas of full-thickness cartilage loss of the left
medial patellar facet.

## 2020-03-19 DIAGNOSIS — G8929 Other chronic pain: Secondary | ICD-10-CM | POA: Diagnosis not present

## 2020-03-19 DIAGNOSIS — M25562 Pain in left knee: Secondary | ICD-10-CM | POA: Diagnosis not present

## 2020-03-19 DIAGNOSIS — M25561 Pain in right knee: Secondary | ICD-10-CM | POA: Diagnosis not present

## 2020-03-19 DIAGNOSIS — M899 Disorder of bone, unspecified: Secondary | ICD-10-CM | POA: Diagnosis not present

## 2020-03-19 DIAGNOSIS — M25462 Effusion, left knee: Secondary | ICD-10-CM | POA: Diagnosis not present

## 2020-03-19 DIAGNOSIS — M170 Bilateral primary osteoarthritis of knee: Secondary | ICD-10-CM | POA: Diagnosis not present

## 2020-03-23 ENCOUNTER — Other Ambulatory Visit: Payer: Self-pay

## 2020-03-23 ENCOUNTER — Ambulatory Visit (INDEPENDENT_AMBULATORY_CARE_PROVIDER_SITE_OTHER): Payer: Medicare Other | Admitting: *Deleted

## 2020-03-23 DIAGNOSIS — B351 Tinea unguium: Secondary | ICD-10-CM

## 2020-03-23 DIAGNOSIS — M79676 Pain in unspecified toe(s): Secondary | ICD-10-CM

## 2020-03-23 NOTE — Patient Instructions (Signed)

## 2020-03-23 NOTE — Progress Notes (Signed)
Patient presents today for the 1st laser treatment. 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 Most of the nails are affected.  All other systems are negative.  Nails were filed thin. Laser therapy was administered to 1-5 toenails bilateral and patient tolerated the treatment well. All safety precautions were in place.    Follow up in 4 weeks for laser # 2.  Picture of nails taken today to document visual progress

## 2020-03-26 DIAGNOSIS — G8929 Other chronic pain: Secondary | ICD-10-CM | POA: Diagnosis not present

## 2020-03-26 DIAGNOSIS — M25561 Pain in right knee: Secondary | ICD-10-CM | POA: Diagnosis not present

## 2020-03-26 DIAGNOSIS — M1712 Unilateral primary osteoarthritis, left knee: Secondary | ICD-10-CM | POA: Diagnosis not present

## 2020-03-26 DIAGNOSIS — M25462 Effusion, left knee: Secondary | ICD-10-CM | POA: Diagnosis not present

## 2020-03-26 DIAGNOSIS — M25562 Pain in left knee: Secondary | ICD-10-CM | POA: Diagnosis not present

## 2020-03-26 DIAGNOSIS — M1711 Unilateral primary osteoarthritis, right knee: Secondary | ICD-10-CM | POA: Diagnosis not present

## 2020-03-28 ENCOUNTER — Ambulatory Visit: Payer: Medicare Other | Admitting: Podiatry

## 2020-04-02 DIAGNOSIS — M25561 Pain in right knee: Secondary | ICD-10-CM | POA: Diagnosis not present

## 2020-04-02 DIAGNOSIS — M25462 Effusion, left knee: Secondary | ICD-10-CM | POA: Diagnosis not present

## 2020-04-02 DIAGNOSIS — G8929 Other chronic pain: Secondary | ICD-10-CM | POA: Diagnosis not present

## 2020-04-02 DIAGNOSIS — M170 Bilateral primary osteoarthritis of knee: Secondary | ICD-10-CM | POA: Diagnosis not present

## 2020-04-02 DIAGNOSIS — M25562 Pain in left knee: Secondary | ICD-10-CM | POA: Diagnosis not present

## 2020-04-03 ENCOUNTER — Other Ambulatory Visit: Payer: Self-pay | Admitting: Family

## 2020-04-03 DIAGNOSIS — Z902 Acquired absence of lung [part of]: Secondary | ICD-10-CM

## 2020-04-04 ENCOUNTER — Ambulatory Visit (INDEPENDENT_AMBULATORY_CARE_PROVIDER_SITE_OTHER): Payer: Medicare Other | Admitting: Dermatology

## 2020-04-04 ENCOUNTER — Telehealth: Payer: Self-pay | Admitting: Family

## 2020-04-04 ENCOUNTER — Encounter: Payer: Self-pay | Admitting: Dermatology

## 2020-04-04 ENCOUNTER — Other Ambulatory Visit: Payer: Self-pay

## 2020-04-04 DIAGNOSIS — K130 Diseases of lips: Secondary | ICD-10-CM

## 2020-04-04 DIAGNOSIS — L570 Actinic keratosis: Secondary | ICD-10-CM | POA: Diagnosis not present

## 2020-04-04 DIAGNOSIS — L820 Inflamed seborrheic keratosis: Secondary | ICD-10-CM | POA: Diagnosis not present

## 2020-04-04 DIAGNOSIS — L814 Other melanin hyperpigmentation: Secondary | ICD-10-CM

## 2020-04-04 DIAGNOSIS — L821 Other seborrheic keratosis: Secondary | ICD-10-CM

## 2020-04-04 DIAGNOSIS — L578 Other skin changes due to chronic exposure to nonionizing radiation: Secondary | ICD-10-CM

## 2020-04-04 DIAGNOSIS — L817 Pigmented purpuric dermatosis: Secondary | ICD-10-CM | POA: Diagnosis not present

## 2020-04-04 NOTE — Progress Notes (Signed)
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  Patient presents today for Follow up on OV 12/01/19 for AK on Head, shin, and ankles, and Cheilitis. Using Dr. Luvenia Heller Cortibalm for lips with improvement. Patient also has a few areas of concern on his face and ears that he would like to have evaluated today.  The following portions of the chart were reviewed this encounter and updated as appropriate:  Tobacco  Allergies  Meds  Problems  Med Hx  Surg Hx  Fam Hx      Review of Systems:  No other skin or systemic complaints except as noted in HPI or Assessment and Plan.  Objective  Well appearing patient in no apparent distress; mood and affect are within normal limits.  A focused examination was performed including extremities, including the arms, hands, fingers, and fingernails and the legs, feet, toes, and toenails and head, including the scalp, face, neck, nose, ears, eyelids, and lips. Relevant physical exam findings are noted in the Assessment and Plan.  Objective  Left Ankle - Lateral, Left Preauricular x 3 (3), Right  Helix, Right Ankle - Medial, Right Ear Lobe, Right Forearm - Anterior, Right Lateral Calf, Right Medial Shinx 2 (2): Erythematous thin papules/macules with gritty scale.   Objective  Right Forehead x 3 (3): Erythematous keratotic or waxy stuck-on papule or plaque.   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    Destruction of  lesion - Left Preauricular x 3  Destruction method: cryotherapy   Informed consent: discussed and consent obtained   Lesion destroyed using liquid nitrogen: Yes   Outcome: patient tolerated procedure well with no complications   Post-procedure details: wound care instructions given    Inflamed seborrheic keratosis (3) Right Forehead x 3  Cryotherapy today Prior to procedure, discussed risks of blister formation, small wound, skin dyspigmentation, or rare scar following cryotherapy.    Destruction of lesion - Right Forehead x 3  Destruction method: cryotherapy   Informed consent: discussed and consent obtained   Lesion destroyed using liquid nitrogen: Yes   Outcome: patient tolerated procedure well with no complications   Post-procedure details: wound care instructions given    Cheilitis Left Lower Vermilion Lip   Recommend Aquaphor lip balm Continue Dr. 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  Observation.  Call clinic for changes.   Seborrheic Keratoses - Stuck-on, waxy, tan-brown papules and plaques  - Discussed benign etiology and prognosis. - Observe - Call for any changes  Lentigines - Scattered tan macules - Discussed due to sun exposure - Benign, observe - Call for any changes  Actinic Damage - diffuse scaly erythematous macules with underlying dyspigmentation - Recommend daily broad spectrum sunscreen SPF 30+ to sun-exposed areas, reapply every 2 hours as needed.  - Call for new or changing lesions.   Return in about 3 months (around  07/05/2020) for AK Follow up.  IDonzetta Kohut, CMA, am acting as scribe for Forest Gleason, MD .  Documentation: I have reviewed the above documentation for accuracy and completeness, and I agree with the above.  Forest Gleason, MD

## 2020-04-04 NOTE — Telephone Encounter (Signed)
Pt dropped off Medical Release for exercise program to be filled out by [facility]. Placed in colored folders in front office.

## 2020-04-04 NOTE — Patient Instructions (Addendum)
Recommend daily broad spectrum sunscreen SPF 30+ to sun-exposed areas, reapply every 2 hours as needed. Call for new or changing lesions.  [email or web address] for Elta UV 50 active  Prior to procedure, discussed risks of blister formation, small wound, skin dyspigmentation, or rare scar following cryotherapy.  Liquid nitrogen was applied for 10-12 seconds to the skin lesion and the expected blistering or scabbing reaction explained. Do not pick at the area. Patient reminded to expect hypopigmented scars from the procedure. Return if lesion fails to fully resolve.  Cryotherapy Aftercare  . Wash gently with soap and water everyday.   Marland Kitchen Apply Vaseline and Band-Aid daily until healed.

## 2020-04-04 NOTE — Telephone Encounter (Signed)
Paperwork has been placed in Facilities manager.

## 2020-04-05 ENCOUNTER — Ambulatory Visit: Payer: Medicare Other | Admitting: Dermatology

## 2020-04-05 DIAGNOSIS — M19012 Primary osteoarthritis, left shoulder: Secondary | ICD-10-CM | POA: Diagnosis not present

## 2020-04-05 DIAGNOSIS — M25512 Pain in left shoulder: Secondary | ICD-10-CM | POA: Diagnosis not present

## 2020-04-05 DIAGNOSIS — G8929 Other chronic pain: Secondary | ICD-10-CM | POA: Diagnosis not present

## 2020-04-05 DIAGNOSIS — M542 Cervicalgia: Secondary | ICD-10-CM | POA: Diagnosis not present

## 2020-04-05 DIAGNOSIS — M24012 Loose body in left shoulder: Secondary | ICD-10-CM | POA: Diagnosis not present

## 2020-04-05 DIAGNOSIS — M62838 Other muscle spasm: Secondary | ICD-10-CM | POA: Diagnosis not present

## 2020-04-06 NOTE — Telephone Encounter (Signed)
Call pt He is upcoming appt in august with me I want to ensure he is not having pvcs during exercise.   We can discuss all during our visit

## 2020-04-10 NOTE — Telephone Encounter (Signed)
noted 

## 2020-04-10 NOTE — Telephone Encounter (Signed)
Patient doesn't have any PVS during exercise.

## 2020-04-17 ENCOUNTER — Encounter: Payer: Self-pay | Admitting: Dermatology

## 2020-04-19 ENCOUNTER — Ambulatory Visit (INDEPENDENT_AMBULATORY_CARE_PROVIDER_SITE_OTHER): Payer: Medicare Other | Admitting: *Deleted

## 2020-04-19 ENCOUNTER — Other Ambulatory Visit: Payer: Self-pay

## 2020-04-19 DIAGNOSIS — B351 Tinea unguium: Secondary | ICD-10-CM

## 2020-04-19 DIAGNOSIS — M79676 Pain in unspecified toe(s): Secondary | ICD-10-CM

## 2020-04-19 NOTE — Progress Notes (Signed)
Patient presents today for the 2nd laser treatment. 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   Most of the nails are affected. No change as of yet.  All other systems are negative.  Nails were filed thin. Laser therapy was administered to 1-5 toenails bilateral and patient tolerated the treatment well. All safety precautions were in place.    Follow up in 4 weeks for laser # 3

## 2020-04-20 ENCOUNTER — Other Ambulatory Visit: Payer: Self-pay | Admitting: *Deleted

## 2020-04-20 ENCOUNTER — Other Ambulatory Visit: Payer: Medicare Other

## 2020-04-20 NOTE — Patient Outreach (Signed)
Member screened for potential [facility] Care Management needs as a benefit of [place] [employer].  Per Patient Pearletha Forge, Mr. Tavenner is receiving skilled therapy at [facility]).   Communication sent to Admissions Coordinator to inquire about anticipated transition plans and potential [facility] Care Management needs.   Will continue to follow while member resides in SNF.  Marthenia Rolling, MSN-Ed, RN,BSN [place] Acute Care Coordinator [phone] [facility]) [phone]  (Toll free office)

## 2020-04-20 NOTE — Patient Outreach (Signed)
[facility] Post- Acute Care Coordinator follow up.  Clarification received from [facility].  Mr. Roye never admitted to SNF. He remains in IL[facility]. He is receiving outpatient therapy.   Therefore, there are no [facility] Care Management needs.    Marthenia Rolling, MSN-Ed, RN,BSN [place] Acute Care Coordinator [phone] [facility]) [phone]  (Toll free office)

## 2020-04-27 ENCOUNTER — Telehealth (INDEPENDENT_AMBULATORY_CARE_PROVIDER_SITE_OTHER): Payer: Medicare Other | Admitting: Family

## 2020-04-27 ENCOUNTER — Encounter: Payer: Self-pay | Admitting: Family

## 2020-04-27 DIAGNOSIS — B3742 Candidal balanitis: Secondary | ICD-10-CM | POA: Diagnosis not present

## 2020-04-27 DIAGNOSIS — I700 Atherosclerosis of aorta: Secondary | ICD-10-CM | POA: Diagnosis not present

## 2020-04-27 NOTE — Progress Notes (Signed)
Virtual Visit via Video Note  I connected with@  on 04/27/20 at  1:30 PM EDT by a video enabled telemedicine application and verified that I am speaking with the correct person using two identifiers.  Location patient: home Location provider:work or home office Persons participating in the virtual visit: patient, provider  I discussed the limitations of evaluation and management by telemedicine and the availability of in person appointments. The patient expressed understanding and agreed to proceed.   HPI:  Feels well today Concern today is primary around that he is  uncircumcised and this creates recurrent yeast infection. He was seen recently by dermatology for this and given ketonconazole regularly. Would like refill. He would like to discuss circumcision with urology.   HLD- compliant with medication and tolerating well. No myalgia.   Following with Dr Candelaria Stagers for knee , neck pain. Awaiting on PT.    ROS: See pertinent positives and negatives per HPI.  Past Medical History:  Diagnosis Date  . Actinic keratosis   . Arthritis   . GERD (gastroesophageal reflux disease)   . Hypothyroidism   . Lung cancer ([place]) 2009   chemo, NO radiation. treated in [place]  . Seborrheic dermatitis   . Thyroid disease    hypothyroid     Past Surgical History:  Procedure Laterality Date  . COLONOSCOPY    . COLONOSCOPY WITH PROPOFOL N/A 11/30/2019   Procedure: COLONOSCOPY WITH PROPOFOL;  Surgeon: Lin Landsman, MD;  Location: [facility] ENDOSCOPY;  Service: Gastroenterology;  Laterality: N/A;  . KNEE SURGERY     bilateral meniscal surgeries  . LUNG LOBECTOMY     upper right lung 12/15/2007   . UPPER GI ENDOSCOPY      Family History  Problem Relation Age of Onset  . Cancer Mother   . Alzheimer's disease Mother   . Stroke Maternal Grandmother   . Hypertension Father   . Alzheimer's disease Father       Current Outpatient Medications:  .  albuterol (VENTOLIN HFA) 108 (90  Base) MCG/ACT inhaler, as needed. , Disp: , Rfl:  .  cetirizine (ZYRTEC) 10 MG tablet, Take 10 mg by mouth daily., Disp: , Rfl:  .  cholecalciferol (VITAMIN D3) 25 MCG (1000 UT) tablet, Take 1,000 Units by mouth daily., Disp: , Rfl:  .  gabapentin (NEURONTIN) 300 MG capsule, TAKE 1 CAPSULE BY MOUTH TWICE DAILY, Disp: 180 capsule, Rfl: 0 .  ketoconazole (NIZORAL) 2 % cream, Apply  a small amount to affected area twice a day, Disp: , Rfl:  .  levothyroxine (SYNTHROID) 50 MCG tablet, TAKE 1 TABLET BY MOUTH ONCE A DAY BEFOREBREAKFAST., Disp: 90 tablet, Rfl: 3 .  meloxicam (MOBIC) 15 MG tablet, Take 1 tablet (15 mg total) by mouth daily., Disp: 90 tablet, Rfl: 1 .  montelukast (SINGULAIR) 10 MG tablet, TAKE 1 TABLET BY MOUTH EVERY NIGHT AT BEDTIME, Disp: 90 tablet, Rfl: 3 .  Multiple Vitamin (MULTIVITAMIN WITH MINERALS) TABS tablet, Take 1 tablet by mouth daily., Disp: , Rfl:  .  nystatin cream (MYCOSTATIN), nystatin 100,000 unit/gram topical cream, Disp: , Rfl:  .  Olopatadine HCl 0.2 % SOLN, as needed. , Disp: , Rfl:  .  Probiotic Product (PROBIOTIC-10) CAPS, Take by mouth., Disp: , Rfl:  .  rosuvastatin (CRESTOR) 10 MG tablet, Take 1 tablet (10 mg total) by mouth daily., Disp: 90 tablet, Rfl: 3 .  triamcinolone (KENALOG) 0.025 % ointment, triamcinolone acetonide 0.025 % topical ointment, Disp: , Rfl:  .  loperamide (  IMODIUM) 2 MG capsule, loperamide 2 mg capsule (Patient not taking: Reported on 04/27/2020), Disp: , Rfl:   EXAM:  VITALS per patient if applicable:  GENERAL: alert, oriented, appears well and in no acute distress  HEENT: atraumatic, conjunttiva clear, no obvious abnormalities on inspection of external nose and ears  NECK: normal movements of the head and neck  LUNGS: on inspection no signs of respiratory distress, breathing rate appears normal, no obvious gross SOB, gasping or wheezing  CV: no obvious cyanosis  MS: moves all visible extremities without noticeable  abnormality  PSYCH/NEURO: pleasant and cooperative, no obvious depression or anxiety, speech and thought processing grossly intact  ASSESSMENT AND PLAN:  Discussed the following assessment and plan:  Candidal balanitis - Plan: Ambulatory referral to Urology  Aortic atherosclerosis ([place]) Problem List Items Addressed This Visit      Cardiovascular and Mediastinum   Aortic atherosclerosis ([place])    Compliant with crestor; will check lipid panel at follow up.         Genitourinary   Candidal balanitis    Chronic. Pending consult/ discussion regarding circumcision with  urology      Relevant Orders   Ambulatory referral to Urology      -we discussed possible serious and likely etiologies, options for evaluation and workup, limitations of telemedicine visit vs in person visit, treatment, treatment risks and precautions. Pt prefers to treat via telemedicine empirically rather then risking or undertaking an in person visit at this moment.  Work/School slipped offered: Advised to seek prompt follow up telemedicine visit or in person care if worsening, new symptoms arise, or if is not improving with treatment. Did let her know that I only do telemedicine on Tuesdays and Thursdays for [place] and advised follow up visit with PCP or UCC if needs follow up or if any further questions arise to avoid any delays.   I discussed the assessment and treatment plan with the patient. The patient was provided an opportunity to ask questions and all were answered. The patient agreed with the plan and demonstrated an understanding of the instructions.   The patient was advised to call back or seek an in-person evaluation if the symptoms worsen or if the condition fails to improve as anticipated.   Mable Paris, FNP

## 2020-04-27 NOTE — Patient Instructions (Signed)
Referral to urology Denies exertional chest pain or pressure, numbness or tingling radiating to left arm or jaw, palpitations, dizziness, frequent headaches, changes in vision, or shortness of breath.   Stay safe!

## 2020-04-27 NOTE — Assessment & Plan Note (Signed)
Compliant with crestor; will check lipid panel at follow up.

## 2020-04-27 NOTE — Assessment & Plan Note (Signed)
Chronic. Pending consult/ discussion regarding circumcision with  urology

## 2020-05-01 ENCOUNTER — Telehealth: Payer: Self-pay | Admitting: Family

## 2020-05-01 NOTE — Telephone Encounter (Signed)
I have emailed to [facility].

## 2020-05-01 NOTE — Telephone Encounter (Signed)
Call pt I forgot to discuss [place] form to participate in exercise.   What kinds of exercise does he do now and what types is he planning to do with [facility]?  Does he have any  exertional chest pain or pressure, numbness or tingling radiating to left arm or jaw, palpitations, dizziness, frequent headaches, changes in vision, or shortness of breath ?   As long as he feels well with exercise and no cardiac concerns, I will complete form

## 2020-05-01 NOTE — Telephone Encounter (Signed)
Completed. At your desk.

## 2020-05-01 NOTE — Telephone Encounter (Signed)
Patient plans to use light weights, treadmill & elliptical.   He confirmed no chest pain or tightness upon exertion, no numbness , tingling or pain in left arm or jaw. He has no dizziness, frequent [place] changes in vision or SOB.  Patient feels well with exercise.

## 2020-05-11 ENCOUNTER — Other Ambulatory Visit: Payer: Self-pay

## 2020-05-11 ENCOUNTER — Ambulatory Visit (INDEPENDENT_AMBULATORY_CARE_PROVIDER_SITE_OTHER): Payer: Medicare Other

## 2020-05-11 DIAGNOSIS — Z23 Encounter for immunization: Secondary | ICD-10-CM

## 2020-05-14 ENCOUNTER — Ambulatory Visit: Payer: Medicare Other | Admitting: Urology

## 2020-05-17 ENCOUNTER — Telehealth: Payer: Self-pay | Admitting: Family

## 2020-05-17 NOTE — Telephone Encounter (Signed)
Patient came into office and would like to establish as a new patient with Dr.Letvak, as he is seeing his wife at [facility]. Please advise if okay for TOC.

## 2020-05-17 NOTE — Telephone Encounter (Signed)
Yes--I met with him today at [facility] and am taking over his wife's care there. Okay to schedule with me-----if he has no acute issues, can wait till AMW is due in February or later. If he wants to be seen sooner, that is okay--go ahead and schedule (30 minutes either way)

## 2020-05-18 ENCOUNTER — Other Ambulatory Visit: Payer: Self-pay

## 2020-05-18 ENCOUNTER — Ambulatory Visit (INDEPENDENT_AMBULATORY_CARE_PROVIDER_SITE_OTHER): Payer: Medicare Other | Admitting: *Deleted

## 2020-05-18 DIAGNOSIS — B351 Tinea unguium: Secondary | ICD-10-CM

## 2020-05-18 DIAGNOSIS — M79676 Pain in unspecified toe(s): Secondary | ICD-10-CM

## 2020-05-18 NOTE — Progress Notes (Signed)
Patient presents today for the 3rd laser treatment. 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   Most of the nails are affected. Slight changes are noticeable today.  All other systems are negative.  Nails were filed thin. Laser therapy was administered to 1-5 toenails bilateral and patient tolerated the treatment well. All safety precautions were in place.    Follow up in 6 weeks for laser # 4.

## 2020-05-18 NOTE — Telephone Encounter (Signed)
Noted Okay with me

## 2020-05-24 ENCOUNTER — Ambulatory Visit: Payer: Medicare Other | Admitting: Urology

## 2020-05-31 DIAGNOSIS — G8929 Other chronic pain: Secondary | ICD-10-CM | POA: Diagnosis not present

## 2020-05-31 DIAGNOSIS — M19012 Primary osteoarthritis, left shoulder: Secondary | ICD-10-CM | POA: Diagnosis not present

## 2020-05-31 DIAGNOSIS — M24012 Loose body in left shoulder: Secondary | ICD-10-CM | POA: Diagnosis not present

## 2020-05-31 DIAGNOSIS — M62838 Other muscle spasm: Secondary | ICD-10-CM | POA: Diagnosis not present

## 2020-05-31 DIAGNOSIS — M542 Cervicalgia: Secondary | ICD-10-CM | POA: Diagnosis not present

## 2020-05-31 DIAGNOSIS — M25512 Pain in left shoulder: Secondary | ICD-10-CM | POA: Diagnosis not present

## 2020-06-01 ENCOUNTER — Other Ambulatory Visit: Payer: Self-pay

## 2020-06-01 ENCOUNTER — Ambulatory Visit (INDEPENDENT_AMBULATORY_CARE_PROVIDER_SITE_OTHER): Payer: Medicare Other | Admitting: Internal Medicine

## 2020-06-01 ENCOUNTER — Encounter: Payer: Self-pay | Admitting: Internal Medicine

## 2020-06-01 DIAGNOSIS — J301 Allergic rhinitis due to pollen: Secondary | ICD-10-CM

## 2020-06-01 DIAGNOSIS — I700 Atherosclerosis of aorta: Secondary | ICD-10-CM | POA: Diagnosis not present

## 2020-06-01 DIAGNOSIS — Z85118 Personal history of other malignant neoplasm of bronchus and lung: Secondary | ICD-10-CM | POA: Diagnosis not present

## 2020-06-01 DIAGNOSIS — T451X5A Adverse effect of antineoplastic and immunosuppressive drugs, initial encounter: Secondary | ICD-10-CM

## 2020-06-01 DIAGNOSIS — G620 Drug-induced polyneuropathy: Secondary | ICD-10-CM

## 2020-06-01 DIAGNOSIS — E039 Hypothyroidism, unspecified: Secondary | ICD-10-CM | POA: Diagnosis not present

## 2020-06-01 DIAGNOSIS — K219 Gastro-esophageal reflux disease without esophagitis: Secondary | ICD-10-CM | POA: Diagnosis not present

## 2020-06-01 MED ORDER — FAMOTIDINE 20 MG PO TABS: 20.0000 mg | ORAL_TABLET | Freq: Every day | ORAL | 3 refills | Status: DC

## 2020-06-01 NOTE — Assessment & Plan Note (Signed)
Is on the statin

## 2020-06-01 NOTE — Assessment & Plan Note (Signed)
Symptoms controlled with gabapentin

## 2020-06-01 NOTE — Progress Notes (Signed)
Subjective:    Patient ID: Kevin Radar, MD, male    DOB: 1941-08-18, 79 y.o.   MRN: [id number]  HPI Here to establish care---transfer of care This visit occurred during the SARS-CoV-2 public health emergency.  Safety protocols were in place, including screening questions prior to the visit, additional usage of staff PPE, and extensive cleaning of exam room while observing appropriate contact time as indicated for disinfecting solutions.   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 and then chemo PET scans for 2 years, then CT scans to over 5 years Dr Janese Banks wants to continue low dose daily CT scans  Continues on rosuvastatin Known aortic atherosclerosis Does see cardiologist yearly  History of low vitamin D Continues on supplement--high dose  On levothyroxine for hypothyroidism No energy levels fine, weight stable, etc  Uses meloxicam regularly due to knee pain Last renal function normal Discussed acid blocker Getting PT for shoulders and knees now  Some recurrent balanoposthitis Ketoconazole cream helps (not circumscribed)  Current Outpatient Medications on File Prior to Visit  Medication Sig Dispense Refill  . cetirizine (ZYRTEC) 10 MG tablet Take 10 mg by mouth daily.    . cholecalciferol (VITAMIN D3) 25 MCG (1000 UT) tablet Take 1,000 Units by mouth daily.    Marland Kitchen gabapentin (NEURONTIN) 300 MG capsule TAKE 1 CAPSULE BY MOUTH TWICE DAILY 180 capsule 0  . ketoconazole (NIZORAL) 2 % cream Apply  a small amount to affected area twice a day    . levothyroxine (SYNTHROID) 50 MCG tablet TAKE 1 TABLET BY MOUTH ONCE A DAY BEFOREBREAKFAST. 90 tablet 3  . meloxicam (MOBIC) 15 MG tablet Take 1 tablet (15 mg total) by mouth daily. 90 tablet 1  . montelukast  (SINGULAIR) 10 MG tablet TAKE 1 TABLET BY MOUTH EVERY NIGHT AT BEDTIME 90 tablet 3  . Multiple Vitamin (MULTIVITAMIN WITH MINERALS) TABS tablet Take 1 tablet by mouth daily.    . Olopatadine HCl 0.2 % SOLN as needed.     . rosuvastatin (CRESTOR) 10 MG tablet Take 1 tablet (10 mg total) by mouth daily. 90 tablet 3   No current facility-administered medications on file prior to visit.    Allergies  Allergen Reactions  . Compazine [Prochlorperazine Edisylate]     muscle spasmus   . Other     [place]ra - hives    Past Medical History:  Diagnosis Date  . Actinic keratosis   . Arthritis   . 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 GERD (gastroesophageal reflux disease)    with atypical cough  . Hypothyroidism   . Lung cancer ([place]) 2009   chemo, NO radiation. treated in [place]  . Seborrheic dermatitis     Past Surgical History:  Procedure Laterality Date  . COLONOSCOPY    . COLONOSCOPY WITH PROPOFOL N/A 11/30/2019   Procedure: COLONOSCOPY WITH PROPOFOL;  Surgeon: Lin Landsman, MD;  Location: [facility] ENDOSCOPY;  Service: Gastroenterology;  Laterality: N/A;  . EYE SURGERY     vitreous detachment---laser  . KNEE CARTILAGE SURGERY Right 1968  . KNEE SURGERY     bilateral meniscal surgeries  . LUNG LOBECTOMY     upper right lung 12/15/2007   . 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  UPPER GI ENDOSCOPY      Family History  Problem Relation Age of Onset  . Cancer Mother   . Alzheimer's disease Mother   . Stroke Maternal Grandmother   . Hypertension Father   . Alzheimer's disease Father     Social History   Socioeconomic History  . Marital status: Married    Spouse name: Not on file  . Number of children: 3  . Years of education: Not on file  . Highest education level: Not on file  Occupational History  . Occupation: Animal nutritionist    Comment: retired  Tobacco Use  . Smoking status: Never Smoker  . Smokeless tobacco: Never Used  Vaping Use  . Vaping Use:  Never used  Substance and Sexual Activity  . Alcohol use: Yes    Comment: Once glass of wine per month, none last 24hrs  . Drug use: Never  . Sexual activity: Yes  Other Topics Concern  . Not on file  Social History Narrative   Married   Animal nutritionist   Wife is nurse    Has a daughter in [place]; son and daughter in [place]    6 grandchildren   [place]land from [place]; spent some time in [place]; moved here 2019.    Lives at [facility]      Has advanced directives   Daughter [name] is health care POA   Would accept resuscitation attempts   No feeding tube if cognitively unaware   Social Determinants of Health   Financial Resource Strain:   . Difficulty of Paying Living Expenses: Not on file  Food Insecurity:   . Worried About Charity fundraiser in the Last Year: Not on file  . Ran Out of Food in the Last Year: Not on file  Transportation Needs:   . Lack of Transportation (Medical): Not on file  . Lack of Transportation (Non-Medical): Not on file  Physical Activity:   . Days of Exercise per Week: Not on file  . Minutes of Exercise per Session: Not on file  Stress:   . Feeling of Stress : Not on file  Social Connections:   . Frequency of Communication with Friends and Family: Not on file  . Frequency of Social Gatherings with Friends and Family: Not on file  . Attends Religious Services: Not on file  . Active Member of Clubs or Organizations: Not on file  . Attends Archivist Meetings: Not on file  . Marital Status: Not on file  Intimate Partner Violence:   . Fear of Current or Ex-Partner: Not on file  . Emotionally Abused: Not on file  . Physically Abused: Not on file  . Sexually Abused: Not on file   Review of Systems Appetite is good Weight fairly stable Sleeps okay----not quite as well since moving wife to memory care No chest pain or SOB Trying to ride bike or walk daily---for an hour (plus exercise room)    Objective:   Physical  Exam Constitutional:      Appearance: Normal appearance.  Cardiovascular:     Rate and Rhythm: Normal rate and regular rhythm.     Pulses: Normal pulses.     Heart sounds: No murmur heard.  No gallop.   Pulmonary:     Effort: Pulmonary effort is normal.     Breath sounds: Normal breath sounds. No wheezing or rales.  Musculoskeletal:     Cervical back: Neck supple.     Right lower leg: No edema.  Left lower leg: No edema.  Lymphadenopathy:     Cervical: No cervical adenopathy.  Skin:    Findings: No rash.  Neurological:     Mental Status: He is alert.  Psychiatric:        Mood and Affect: Mood normal.        Behavior: Behavior normal.            Assessment & Plan:

## 2020-06-01 NOTE — Assessment & Plan Note (Signed)
With reflex cough--gabapentin helps Will start evening famotidine due to the meloxicam

## 2020-06-01 NOTE — Assessment & Plan Note (Signed)
Seems euthyroid on levothyroxine 50 Will recheck labs at yearly check up in March

## 2020-06-01 NOTE — Assessment & Plan Note (Signed)
Will continue yearly low dose CT scan

## 2020-06-04 DIAGNOSIS — M47816 Spondylosis without myelopathy or radiculopathy, lumbar region: Secondary | ICD-10-CM | POA: Diagnosis not present

## 2020-06-04 DIAGNOSIS — M5137 Other intervertebral disc degeneration, lumbosacral region: Secondary | ICD-10-CM | POA: Diagnosis not present

## 2020-06-04 DIAGNOSIS — G8929 Other chronic pain: Secondary | ICD-10-CM | POA: Diagnosis not present

## 2020-06-04 DIAGNOSIS — M533 Sacrococcygeal disorders, not elsewhere classified: Secondary | ICD-10-CM | POA: Diagnosis not present

## 2020-06-04 DIAGNOSIS — M545 Low back pain, unspecified: Secondary | ICD-10-CM | POA: Diagnosis not present

## 2020-06-07 ENCOUNTER — Encounter: Payer: Self-pay | Admitting: Urology

## 2020-06-07 ENCOUNTER — Other Ambulatory Visit: Payer: Self-pay

## 2020-06-07 ENCOUNTER — Ambulatory Visit (INDEPENDENT_AMBULATORY_CARE_PROVIDER_SITE_OTHER): Payer: Medicare Other | Admitting: Urology

## 2020-06-07 VITALS — BP 116/74 | HR 67 | Ht 73.0 in | Wt 180.0 lb

## 2020-06-07 DIAGNOSIS — M542 Cervicalgia: Secondary | ICD-10-CM | POA: Diagnosis not present

## 2020-06-07 DIAGNOSIS — B3742 Candidal balanitis: Secondary | ICD-10-CM

## 2020-06-07 DIAGNOSIS — Z125 Encounter for screening for malignant neoplasm of prostate: Secondary | ICD-10-CM | POA: Diagnosis not present

## 2020-06-07 DIAGNOSIS — M25512 Pain in left shoulder: Secondary | ICD-10-CM | POA: Diagnosis not present

## 2020-06-07 DIAGNOSIS — M62838 Other muscle spasm: Secondary | ICD-10-CM | POA: Diagnosis not present

## 2020-06-07 DIAGNOSIS — M19012 Primary osteoarthritis, left shoulder: Secondary | ICD-10-CM | POA: Diagnosis not present

## 2020-06-07 DIAGNOSIS — G8929 Other chronic pain: Secondary | ICD-10-CM | POA: Diagnosis not present

## 2020-06-07 DIAGNOSIS — M24012 Loose body in left shoulder: Secondary | ICD-10-CM | POA: Diagnosis not present

## 2020-06-07 LAB — URINALYSIS, COMPLETE
Bilirubin, UA: NEGATIVE
Glucose, UA: NEGATIVE
Ketones, UA: NEGATIVE
Leukocytes,UA: NEGATIVE
Nitrite, UA: NEGATIVE
Protein,UA: NEGATIVE
RBC, UA: NEGATIVE
Specific Gravity, UA: 1.02 (ref 1.005–1.030)
Urobilinogen, Ur: 0.2 mg/dL (ref 0.2–1.0)
pH, UA: 5 (ref 5.0–7.5)

## 2020-06-07 LAB — MICROSCOPIC EXAMINATION
Bacteria, UA: NONE SEEN
Epithelial Cells (non renal): NONE SEEN /hpf (ref 0–10)

## 2020-06-07 NOTE — Patient Instructions (Signed)
Prostate Cancer Screening  Prostate cancer screening is a test that is done to check for the presence of prostate cancer in men. The prostate gland is a walnut-sized gland that is located below the bladder and in front of the rectum in males. The function of the prostate is to add fluid to semen during ejaculation. Prostate cancer is the second most common type of cancer in men. Who should have prostate cancer screening?  Screening recommendations vary based on age and other risk factors. Screening is recommended if:  You are older than age 55. If you are age 55-69, talk with your health care provider about your need for screening and how often screening should be done. Because most prostate cancers are slow growing and will not cause death, screening is generally reserved in this age group for men who have a 10-15-year life expectancy.  You are younger than age 55, and you have these risk factors: ? Being a black male or a male of African descent. ? Having a father, brother, or uncle who has been diagnosed with prostate cancer. The risk is higher if your family member's cancer occurred at an early age. Screening is not recommended if:  You are younger than age 40.  You are between the ages of 40 and 54 and you have no risk factors.  You are 70 years of age or older. At this age, the risks that screening can cause are greater than the benefits that it may provide. If you are at high risk for prostate cancer, your health care provider may recommend that you have screenings more often or that you start screening at a younger age. How is screening for prostate cancer done? The recommended prostate cancer screening test is a blood test called the prostate-specific antigen (PSA) test. PSA is a protein that is made in the prostate. As you age, your prostate naturally produces more PSA. Abnormally high PSA levels may be caused by:  Prostate cancer.  An enlarged prostate that is not caused by cancer  (benign prostatic hyperplasia, BPH). This condition is very common in older men.  A prostate gland infection (prostatitis). Depending on the PSA results, you may need more tests, such as:  A physical exam to check the size of your prostate gland.  Blood and imaging tests.  A procedure to remove tissue samples from your prostate gland for testing (biopsy). What are the benefits of prostate cancer screening?  Screening can help to identify cancer at an early stage, before symptoms start and when the cancer can be treated more easily.  There is a small chance that screening may lower your risk of dying from prostate cancer. The chance is small because prostate cancer is a slow-growing cancer, and most men with prostate cancer die from a different cause. What are the risks of prostate cancer screening? The main risk of prostate cancer screening is diagnosing and treating prostate cancer that would never have caused any symptoms or problems. This is called overdiagnosisand overtreatment. PSA screening cannot tell you if your PSA is high due to cancer or a different cause. A prostate biopsy is the only procedure to diagnose prostate cancer. Even the results of a biopsy may not tell you if your cancer needs to be treated. Slow-growing prostate cancer may not need any treatment other than monitoring, so diagnosing and treating it may cause unnecessary stress or other side effects. A prostate biopsy may also cause:  Infection or fever.  A false negative. This is   a result that shows that you do not have prostate cancer when you actually do have prostate cancer. Questions to ask your health care provider  When should I start prostate cancer screening?  What is my risk for prostate cancer?  How often do I need screening?  What type of screening tests do I need?  How do I get my test results?  What do my results mean?  Do I need treatment? Where to find more information  The American Cancer  Society: www.cancer.org  American Urological Association: www.auanet.org Contact a health care provider if:  You have difficulty urinating.  You have pain when you urinate or ejaculate.  You have blood in your urine or semen.  You have pain in your back or in the area of your prostate. Summary  Prostate cancer is a common type of cancer in men. The prostate gland is located below the bladder and in front of the rectum. This gland adds fluid to semen during ejaculation.  Prostate cancer screening may identify cancer at an early stage, when the cancer can be treated more easily.  The prostate-specific antigen (PSA) test is the recommended screening test for prostate cancer.  Discuss the risks and benefits of prostate cancer screening with your health care provider. If you are age 70 or older, the risks that screening can cause are greater than the benefits that it may provide. This information is not intended to replace advice given to you by your health care provider. Make sure you discuss any questions you have with your health care provider. Document Revised: 03/31/2019 Document Reviewed: 03/31/2019 Elsevier Patient Education  2020 Elsevier Inc.  

## 2020-06-07 NOTE — Progress Notes (Signed)
06/07/20 2:37 PM   [name] 1940/09/22 [id number]  CC: Recurrent balanitis, PSA screening  HPI: I saw Mr. Studer in urology clinic for the above issues.  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  He does note that over this time he has not showered his frequently taking care of his wife with Alzheimer's disease.  These have been treated with 2% ketoconazole and cleared up.  He has no acute symptoms at this time.  He denies any significant urinary symptoms at baseline.  He has had regular PSA screening, and last PSA was well within normal range at 0.59.  He denies any gross hematuria.  Urinalysis today is benign with 0-5 WBCs, 0-2 RBCs, no bacteria, no yeast, nitrite negative, no leukocytes.   PMH: Past Medical History:  Diagnosis Date  . Actinic keratosis   . Arthritis   . 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 GERD (gastroesophageal reflux disease)    with atypical cough  . Hypothyroidism   . Lung cancer ([place]) 2009   chemo, NO radiation. treated in [place]  . Seborrheic dermatitis     Surgical History: Past Surgical History:  Procedure Laterality Date  . COLONOSCOPY    . COLONOSCOPY WITH PROPOFOL N/A 11/30/2019   Procedure: COLONOSCOPY WITH PROPOFOL;  Surgeon: Lin Landsman, MD;  Location: [facility] ENDOSCOPY;  Service: Gastroenterology;  Laterality: N/A;  . EYE SURGERY     vitreous detachment---laser  . KNEE CARTILAGE SURGERY Right 1968  . KNEE SURGERY     bilateral meniscal surgeries  . LUNG LOBECTOMY     upper right lung 12/15/2007   . 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 UPPER GI ENDOSCOPY      Family History: Family History  Problem Relation Age of Onset  . Cancer Mother   . Alzheimer's disease Mother   . Stroke Maternal Grandmother   . Hypertension Father   . Alzheimer's disease Father      Social History:  reports that he has never smoked. He has never used smokeless tobacco. He reports current alcohol use. He reports that he does not use drugs.  Physical Exam: BP 116/74   Pulse 67   Ht 6\' 1"  (1.854 m)   Wt 180 lb (81.6 kg)   BMI 23.75 kg/m    Constitutional:  Alert and oriented, No acute distress. Cardiovascular: No clubbing, cyanosis, or edema. Respiratory: Normal respiratory effort, no increased work of breathing. GI: Abdomen is soft, nontender, nondistended, no abdominal masses GU: Uncircumcised phallus with patent meatus, no evidence of infection or other lesions  Laboratory Data: Reviewed  Pertinent Imaging: None to review  Assessment & Plan:   He is a 79 year old male here today to discuss recurrent balanitis as well as PSA screening.  He currently has no evidence of acute balanitis, and we discussed a trial of Mycolog cream in the future if he has a recurrence.  We discussed basic hygiene strategies to prevent balanitis in the future.  We also discussed circumcision in the future if he has recurrent episodes, though I doubt this would be necessary.  Regarding PSA screening, we reviewed the AUA guidelines that do not recommend routine screening in men over age 52, additionally his PSA of less than 1 is very reassuring that he is at very low risk for developing  any kind of clinically significant prostate cancer.  Mycolog cream sent to pharmacy Follow-up as needed  Nickolas Madrid, MD 06/07/2020  [facility] [address] [place], [place] [address] [phone]

## 2020-06-08 ENCOUNTER — Other Ambulatory Visit: Payer: Self-pay | Admitting: Dermatology

## 2020-06-12 DIAGNOSIS — M62838 Other muscle spasm: Secondary | ICD-10-CM | POA: Diagnosis not present

## 2020-06-12 DIAGNOSIS — M19012 Primary osteoarthritis, left shoulder: Secondary | ICD-10-CM | POA: Diagnosis not present

## 2020-06-12 DIAGNOSIS — M25512 Pain in left shoulder: Secondary | ICD-10-CM | POA: Diagnosis not present

## 2020-06-12 DIAGNOSIS — M24012 Loose body in left shoulder: Secondary | ICD-10-CM | POA: Diagnosis not present

## 2020-06-12 DIAGNOSIS — M542 Cervicalgia: Secondary | ICD-10-CM | POA: Diagnosis not present

## 2020-06-12 DIAGNOSIS — G8929 Other chronic pain: Secondary | ICD-10-CM | POA: Diagnosis not present

## 2020-06-14 DIAGNOSIS — M62838 Other muscle spasm: Secondary | ICD-10-CM | POA: Diagnosis not present

## 2020-06-14 DIAGNOSIS — M542 Cervicalgia: Secondary | ICD-10-CM | POA: Diagnosis not present

## 2020-06-14 DIAGNOSIS — M19012 Primary osteoarthritis, left shoulder: Secondary | ICD-10-CM | POA: Diagnosis not present

## 2020-06-14 DIAGNOSIS — G8929 Other chronic pain: Secondary | ICD-10-CM | POA: Diagnosis not present

## 2020-06-14 DIAGNOSIS — M25512 Pain in left shoulder: Secondary | ICD-10-CM | POA: Diagnosis not present

## 2020-06-14 DIAGNOSIS — M24012 Loose body in left shoulder: Secondary | ICD-10-CM | POA: Diagnosis not present

## 2020-06-19 DIAGNOSIS — M62838 Other muscle spasm: Secondary | ICD-10-CM | POA: Diagnosis not present

## 2020-06-19 DIAGNOSIS — M542 Cervicalgia: Secondary | ICD-10-CM | POA: Diagnosis not present

## 2020-06-19 DIAGNOSIS — M19012 Primary osteoarthritis, left shoulder: Secondary | ICD-10-CM | POA: Diagnosis not present

## 2020-06-19 DIAGNOSIS — M24012 Loose body in left shoulder: Secondary | ICD-10-CM | POA: Diagnosis not present

## 2020-06-19 DIAGNOSIS — M25512 Pain in left shoulder: Secondary | ICD-10-CM | POA: Diagnosis not present

## 2020-06-19 DIAGNOSIS — G8929 Other chronic pain: Secondary | ICD-10-CM | POA: Diagnosis not present

## 2020-06-21 DIAGNOSIS — M62838 Other muscle spasm: Secondary | ICD-10-CM | POA: Diagnosis not present

## 2020-06-21 DIAGNOSIS — M542 Cervicalgia: Secondary | ICD-10-CM | POA: Diagnosis not present

## 2020-06-21 DIAGNOSIS — M24012 Loose body in left shoulder: Secondary | ICD-10-CM | POA: Diagnosis not present

## 2020-06-21 DIAGNOSIS — M25512 Pain in left shoulder: Secondary | ICD-10-CM | POA: Diagnosis not present

## 2020-06-21 DIAGNOSIS — G8929 Other chronic pain: Secondary | ICD-10-CM | POA: Diagnosis not present

## 2020-06-21 DIAGNOSIS — M19012 Primary osteoarthritis, left shoulder: Secondary | ICD-10-CM | POA: Diagnosis not present

## 2020-06-22 ENCOUNTER — Other Ambulatory Visit: Payer: Medicare Other

## 2020-06-26 DIAGNOSIS — M542 Cervicalgia: Secondary | ICD-10-CM | POA: Diagnosis not present

## 2020-06-26 DIAGNOSIS — M25512 Pain in left shoulder: Secondary | ICD-10-CM | POA: Diagnosis not present

## 2020-06-26 DIAGNOSIS — M62838 Other muscle spasm: Secondary | ICD-10-CM | POA: Diagnosis not present

## 2020-06-26 DIAGNOSIS — M19012 Primary osteoarthritis, left shoulder: Secondary | ICD-10-CM | POA: Diagnosis not present

## 2020-06-26 DIAGNOSIS — M24012 Loose body in left shoulder: Secondary | ICD-10-CM | POA: Diagnosis not present

## 2020-06-26 DIAGNOSIS — G8929 Other chronic pain: Secondary | ICD-10-CM | POA: Diagnosis not present

## 2020-06-27 ENCOUNTER — Telehealth: Payer: Self-pay | Admitting: Urology

## 2020-06-27 DIAGNOSIS — B3742 Candidal balanitis: Secondary | ICD-10-CM

## 2020-06-27 NOTE — Telephone Encounter (Signed)
Pt called requesting a Rx for Mycolog cream to be sent to [facility]. Please advise.

## 2020-06-28 DIAGNOSIS — M24012 Loose body in left shoulder: Secondary | ICD-10-CM | POA: Diagnosis not present

## 2020-06-28 DIAGNOSIS — M542 Cervicalgia: Secondary | ICD-10-CM | POA: Diagnosis not present

## 2020-06-28 DIAGNOSIS — M62838 Other muscle spasm: Secondary | ICD-10-CM | POA: Diagnosis not present

## 2020-06-28 DIAGNOSIS — M25512 Pain in left shoulder: Secondary | ICD-10-CM | POA: Diagnosis not present

## 2020-06-28 DIAGNOSIS — M19012 Primary osteoarthritis, left shoulder: Secondary | ICD-10-CM | POA: Diagnosis not present

## 2020-06-28 DIAGNOSIS — G8929 Other chronic pain: Secondary | ICD-10-CM | POA: Diagnosis not present

## 2020-06-28 MED ORDER — NYSTATIN-TRIAMCINOLONE 100000-0.1 UNIT/GM-% EX OINT: 1.0000 "application " | TOPICAL_OINTMENT | Freq: Two times a day (BID) | CUTANEOUS | 3 refills | Status: DC

## 2020-06-28 NOTE — Telephone Encounter (Signed)
RX sent

## 2020-06-28 NOTE — Telephone Encounter (Signed)
OK to refill with 3 refills, thanks  Nickolas Madrid, MD 06/28/2020

## 2020-06-29 ENCOUNTER — Ambulatory Visit (INDEPENDENT_AMBULATORY_CARE_PROVIDER_SITE_OTHER): Payer: Medicare Other | Admitting: *Deleted

## 2020-06-29 ENCOUNTER — Other Ambulatory Visit: Payer: Self-pay

## 2020-06-29 DIAGNOSIS — M79676 Pain in unspecified toe(s): Secondary | ICD-10-CM

## 2020-06-29 DIAGNOSIS — B351 Tinea unguium: Secondary | ICD-10-CM

## 2020-06-29 NOTE — Progress Notes (Signed)
Patient presents today for the 4th laser treatment. 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   Most of the nails are affected. He is happy with the progress his nails are making.  All other systems are negative.  Nails were filed thin. Laser therapy was administered to 1-5 toenails bilateral and patient tolerated the treatment well. All safety precautions were in place.    Follow up in 6 weeks for laser # 5.

## 2020-07-04 ENCOUNTER — Other Ambulatory Visit: Payer: Self-pay

## 2020-07-04 ENCOUNTER — Ambulatory Visit (INDEPENDENT_AMBULATORY_CARE_PROVIDER_SITE_OTHER): Payer: Medicare Other | Admitting: Dermatology

## 2020-07-04 ENCOUNTER — Encounter: Payer: Self-pay | Admitting: Dermatology

## 2020-07-04 DIAGNOSIS — L570 Actinic keratosis: Secondary | ICD-10-CM

## 2020-07-04 DIAGNOSIS — L578 Other skin changes due to chronic exposure to nonionizing radiation: Secondary | ICD-10-CM | POA: Diagnosis not present

## 2020-07-04 DIAGNOSIS — L820 Inflamed seborrheic keratosis: Secondary | ICD-10-CM | POA: Diagnosis not present

## 2020-07-04 DIAGNOSIS — L309 Dermatitis, unspecified: Secondary | ICD-10-CM

## 2020-07-04 MED ORDER — TACROLIMUS 0.1 % EX OINT: TOPICAL_OINTMENT | Freq: Two times a day (BID) | CUTANEOUS | 0 refills | Status: DC

## 2020-07-04 NOTE — Progress Notes (Signed)
Follow-Up Visit   Subjective  Kevin Terrell is a 79 y.o. male who presents for the following: follow-up actinic keratoses.   He also reports multiple irritated raised spots on chest and shoulders that seem to rub on clothing.  The following portions of the chart were reviewed this encounter and updated as appropriate: Tobacco  Allergies  Meds  Problems  Med Hx  Surg Hx  Fam Hx      Review of Systems: No other skin or systemic complaints except as noted in HPI or Assessment and Plan.   Objective  Well appearing patient in no apparent distress; mood and affect are within normal limits.  A focused examination was performed including face, ears, neck, chest, hands, shoulders, legs. Relevant physical exam findings are noted in the Assessment and Plan.  Objective  shoulders and chest (11): Erythematous keratotic or waxy stuck-on papules and plaques.   Objective  bilateral hands: Fissure on right thumb, thin skin at palms  Objective  left preauricular x 1, left helix x 1, right medial shin x 2, right lateral calf x 1, right popliteal fossa x 1, right medial ankle x 1, left medial ankle x 1 (8): Erythematous thin papules/macules with gritty scale.   Assessment & Plan  Inflamed seborrheic keratosis (11) shoulders and chest  Symptomatic. Prior to procedure, discussed risks of blister formation, small wound, skin dyspigmentation, or rare scar following cryotherapy.    Destruction of lesion - shoulders and chest  Destruction method: cryotherapy   Informed consent: discussed and consent obtained   Lesion destroyed using liquid nitrogen: Yes   Cryotherapy cycles:  2 Outcome: patient tolerated procedure well with no complications   Post-procedure details: wound care instructions given    Hand dermatitis bilateral hands  Chronic, currently well-controlled but with side effect (early atrophy) from topical steroids  D/c triamcinolone - use only briefly for flare that does  not respond to tacrolimus.  Start Tacrolimus 0.1% ointment. Apply to hands daily as needed.   Topical steroids (such as triamcinolone, fluocinolone, fluocinonide, mometasone, clobetasol, halobetasol, betamethasone, hydrocortisone) can cause thinning and lightening of the skin if they are used for too long in the same area. Your physician has selected the right strength medicine for your problem and area affected on the body. Please use your medication only as directed by your physician to prevent side effects.    Recommend VaniCream or Neutrogena [place] hand cream for a moisturizer to damp skin after washing hands. Apply as needed. Continue gentle skin care.   tacrolimus (PROTOPIC) 0.1 % ointment - bilateral hands  AK (actinic keratosis) (8) left preauricular x 1, left helix x 1, right medial shin x 2, right lateral calf x 1, right popliteal fossa x 1, right medial ankle x 1, left medial ankle x 1  Prior to procedure, discussed risks of blister formation, small wound, skin dyspigmentation, or rare scar following cryotherapy.    Destruction of lesion - left preauricular x 1, left helix x 1, right medial shin x 2, right lateral calf x 1, right popliteal fossa x 1, right medial ankle x 1, left medial ankle x 1  Destruction method: cryotherapy   Informed consent: discussed and consent obtained   Lesion destroyed using liquid nitrogen: Yes   Cryotherapy cycles:  2 Outcome: patient tolerated procedure well with no complications   Post-procedure details: wound care instructions given    Actinic Damage - chronic, secondary to cumulative UV radiation exposure/sun exposure over time - diffuse scaly erythematous macules  with underlying dyspigmentation - Recommend daily broad spectrum sunscreen SPF 30+ to sun-exposed areas, reapply every 2 hours as needed.  - Call for new or changing lesions.  Return in about 3 months (around 10/04/2020) for recheck hand derm and AKs.   I, Harriett Sine, CMA,  am acting as scribe for Forest Gleason, MD.  Documentation: I have reviewed the above documentation for accuracy and completeness, and I agree with the above.  Forest Gleason, MD

## 2020-07-05 DIAGNOSIS — M47816 Spondylosis without myelopathy or radiculopathy, lumbar region: Secondary | ICD-10-CM | POA: Diagnosis not present

## 2020-07-05 DIAGNOSIS — M545 Low back pain, unspecified: Secondary | ICD-10-CM | POA: Diagnosis not present

## 2020-07-05 DIAGNOSIS — M533 Sacrococcygeal disorders, not elsewhere classified: Secondary | ICD-10-CM | POA: Diagnosis not present

## 2020-07-05 DIAGNOSIS — M5137 Other intervertebral disc degeneration, lumbosacral region: Secondary | ICD-10-CM | POA: Diagnosis not present

## 2020-07-09 ENCOUNTER — Other Ambulatory Visit: Payer: Self-pay | Admitting: Family

## 2020-07-09 DIAGNOSIS — M1990 Unspecified osteoarthritis, unspecified site: Secondary | ICD-10-CM

## 2020-07-10 DIAGNOSIS — M533 Sacrococcygeal disorders, not elsewhere classified: Secondary | ICD-10-CM | POA: Diagnosis not present

## 2020-07-10 DIAGNOSIS — M5137 Other intervertebral disc degeneration, lumbosacral region: Secondary | ICD-10-CM | POA: Diagnosis not present

## 2020-07-10 DIAGNOSIS — M47816 Spondylosis without myelopathy or radiculopathy, lumbar region: Secondary | ICD-10-CM | POA: Diagnosis not present

## 2020-07-10 DIAGNOSIS — M545 Low back pain, unspecified: Secondary | ICD-10-CM | POA: Diagnosis not present

## 2020-07-12 DIAGNOSIS — M533 Sacrococcygeal disorders, not elsewhere classified: Secondary | ICD-10-CM | POA: Diagnosis not present

## 2020-07-12 DIAGNOSIS — M47816 Spondylosis without myelopathy or radiculopathy, lumbar region: Secondary | ICD-10-CM | POA: Diagnosis not present

## 2020-07-12 DIAGNOSIS — M5137 Other intervertebral disc degeneration, lumbosacral region: Secondary | ICD-10-CM | POA: Diagnosis not present

## 2020-07-12 DIAGNOSIS — M545 Low back pain, unspecified: Secondary | ICD-10-CM | POA: Diagnosis not present

## 2020-07-17 ENCOUNTER — Telehealth: Payer: Self-pay

## 2020-07-17 DIAGNOSIS — Z23 Encounter for immunization: Secondary | ICD-10-CM | POA: Diagnosis not present

## 2020-07-17 DIAGNOSIS — M47816 Spondylosis without myelopathy or radiculopathy, lumbar region: Secondary | ICD-10-CM | POA: Diagnosis not present

## 2020-07-17 DIAGNOSIS — M533 Sacrococcygeal disorders, not elsewhere classified: Secondary | ICD-10-CM | POA: Diagnosis not present

## 2020-07-17 DIAGNOSIS — M5137 Other intervertebral disc degeneration, lumbosacral region: Secondary | ICD-10-CM | POA: Diagnosis not present

## 2020-07-17 DIAGNOSIS — M545 Low back pain, unspecified: Secondary | ICD-10-CM | POA: Diagnosis not present

## 2020-07-17 NOTE — Telephone Encounter (Signed)
Pt called and stated that he will not be using the tacrolimus due to the cost being $300+ out of pocket. He states that he has decided that he will continue the [facility]. Pt stated that he has not needed it more than once in the past 12 months. He says that it works well and rapidly for him. He said that he will use it only as necessary to avoid side effects of long term topical steroid use. Pt stated that he will take good care of his hands throughout the winter with VaniCream to try and avoid the need for the [facility].

## 2020-07-17 NOTE — Telephone Encounter (Signed)
I updated the immunization chart. Wanted Dr Silvio Pate to see the other note about his wife.

## 2020-07-18 NOTE — Telephone Encounter (Signed)
Please advise patient that he can should be able to get tacrolimus from [facility] with a goodrx coupon card for less than $40. If he is agreeable, we will send in the 30 gram tube to [facility]. Thank you!

## 2020-07-19 DIAGNOSIS — M533 Sacrococcygeal disorders, not elsewhere classified: Secondary | ICD-10-CM | POA: Diagnosis not present

## 2020-07-19 DIAGNOSIS — M47816 Spondylosis without myelopathy or radiculopathy, lumbar region: Secondary | ICD-10-CM | POA: Diagnosis not present

## 2020-07-19 DIAGNOSIS — M5137 Other intervertebral disc degeneration, lumbosacral region: Secondary | ICD-10-CM | POA: Diagnosis not present

## 2020-07-19 DIAGNOSIS — M545 Low back pain, unspecified: Secondary | ICD-10-CM | POA: Diagnosis not present

## 2020-07-23 NOTE — Telephone Encounter (Signed)
Very rare use of triamcinolone is probably ok but I do already see thinning of the skin on his hands so I would recommend avoiding it completely or using it only very rarely to prevent any additional thinning. I think it is fine to continue without getting the tacrolimus for now and fill it later if he is needing to use the triamcinolone more than a few times. Thank you

## 2020-07-23 NOTE — Telephone Encounter (Signed)
Spoke with patient and advised he can get tacrolimus at [facility] with [facility] for around $40. He said he will get it if you absolutely do not want him using the TMC0.1%, which is not very often at all. I told him I would ask you and call him back tomorrow, JS

## 2020-07-24 ENCOUNTER — Other Ambulatory Visit: Payer: Self-pay

## 2020-07-24 DIAGNOSIS — L309 Dermatitis, unspecified: Secondary | ICD-10-CM

## 2020-07-24 MED ORDER — TACROLIMUS 0.1 % EX OINT: TOPICAL_OINTMENT | Freq: Two times a day (BID) | CUTANEOUS | 0 refills | Status: DC

## 2020-07-24 NOTE — Telephone Encounter (Signed)
Patient advised per Dr. Laurence Ferrari - "Very rare use of triamcinolone is probably ok but I do already see thinning of the skin on his hands so I would recommend avoiding it completely or using it only very rarely to prevent any additional thinning. I think it is fine to continue without getting the tacrolimus for now and fill it later if he is needing to use the triamcinolone more than a few times." Will send in tacrolimus per patient's request, JS

## 2020-07-31 ENCOUNTER — Other Ambulatory Visit: Payer: Self-pay | Admitting: Internal Medicine

## 2020-07-31 DIAGNOSIS — M5137 Other intervertebral disc degeneration, lumbosacral region: Secondary | ICD-10-CM | POA: Diagnosis not present

## 2020-07-31 DIAGNOSIS — M47816 Spondylosis without myelopathy or radiculopathy, lumbar region: Secondary | ICD-10-CM | POA: Diagnosis not present

## 2020-07-31 DIAGNOSIS — E039 Hypothyroidism, unspecified: Secondary | ICD-10-CM

## 2020-07-31 DIAGNOSIS — M545 Low back pain, unspecified: Secondary | ICD-10-CM | POA: Diagnosis not present

## 2020-07-31 DIAGNOSIS — M533 Sacrococcygeal disorders, not elsewhere classified: Secondary | ICD-10-CM | POA: Diagnosis not present

## 2020-08-02 DIAGNOSIS — M5137 Other intervertebral disc degeneration, lumbosacral region: Secondary | ICD-10-CM | POA: Diagnosis not present

## 2020-08-02 DIAGNOSIS — M545 Low back pain, unspecified: Secondary | ICD-10-CM | POA: Diagnosis not present

## 2020-08-02 DIAGNOSIS — M47816 Spondylosis without myelopathy or radiculopathy, lumbar region: Secondary | ICD-10-CM | POA: Diagnosis not present

## 2020-08-02 DIAGNOSIS — M533 Sacrococcygeal disorders, not elsewhere classified: Secondary | ICD-10-CM | POA: Diagnosis not present

## 2020-08-03 ENCOUNTER — Other Ambulatory Visit: Payer: Medicare Other

## 2020-08-07 ENCOUNTER — Telehealth: Payer: Self-pay

## 2020-08-07 NOTE — Telephone Encounter (Signed)
Spoke to pt. He will let us know if he ever becomes symptomatic.

## 2020-08-07 NOTE — Telephone Encounter (Signed)
Because of his age, he would be a candidate for the infusion---hopefully will not be an issue since he has had his booster. If he has symptoms, and tests positive, he should let us know ASAP

## 2020-08-07 NOTE — Telephone Encounter (Signed)
Patient called and wanted to know what the plan is if he got Covid. Patient stated that he has not been exposed to covid and he takes all the necessary precautions in order to avoid the virus. However, patient just wanted to be prepared if the time comes, what his plan of care would look like. Patient denies any symptoms of Covid. This RN explained to patient to continue to take the necessary precautions to avoid covid and to report if he has been exposed. Patient had questions regarding the infusion and proper testing. Informed patient that the PCR test is the most reliable, but as far as the infusion, that would be up to his doctor. Patient stated that he just wanted to find out from Dr. Silvio Pate would the plan of care would be IF he got the virus.

## 2020-08-13 ENCOUNTER — Telehealth: Payer: Self-pay | Admitting: Internal Medicine

## 2020-08-13 NOTE — Telephone Encounter (Signed)
Pt called in wanted to know if he needed to resume with taking the baby Asprin 81 mg and he has not been taking for the past 35yrs due to his pcp told him to stop

## 2020-08-13 NOTE — Telephone Encounter (Signed)
Spoke to pt

## 2020-08-13 NOTE — Telephone Encounter (Signed)
It is not a firm recommendation but I think it is reasonable for him to stay off it---because the benefit may not outweigh the risks of stomach bleeding or kidney problems (especially if he is still taking the meloxicam regularly)

## 2020-09-07 ENCOUNTER — Other Ambulatory Visit: Payer: Medicare Other

## 2020-09-14 ENCOUNTER — Other Ambulatory Visit: Payer: Self-pay

## 2020-09-14 ENCOUNTER — Encounter: Payer: Self-pay | Admitting: *Deleted

## 2020-09-14 ENCOUNTER — Telehealth: Payer: Self-pay | Admitting: Internal Medicine

## 2020-09-14 ENCOUNTER — Ambulatory Visit (INDEPENDENT_AMBULATORY_CARE_PROVIDER_SITE_OTHER): Payer: Medicare Other | Admitting: *Deleted

## 2020-09-14 DIAGNOSIS — B351 Tinea unguium: Secondary | ICD-10-CM

## 2020-09-14 DIAGNOSIS — M79676 Pain in unspecified toe(s): Secondary | ICD-10-CM

## 2020-09-14 NOTE — Progress Notes (Signed)
Patient presents today for the 5th laser treatment. 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   Most of the nails are affected. The hallux nails have started to show some healthy nail growth.  All other systems are negative.  Nails were filed thin. Laser therapy was administered to 1-5 toenails bilateral and patient tolerated the treatment well. All safety precautions were in place.    Follow up in 8 weeks for laser # 6.   ~Take final pics next visit~

## 2020-09-14 NOTE — Telephone Encounter (Signed)
Spoke to pt. He appreciated the information.

## 2020-09-14 NOTE — Telephone Encounter (Signed)
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  I would also caution him that many plant based foods (like the meatless burgers) are very high in fat---and may not really be more healthy. A diet of lots of vegetables with lean meats, chicken and fish is likely a better approach

## 2020-09-14 NOTE — Telephone Encounter (Signed)
Patient is requesting a call back to discuss his cholest. Meds. Patient is starting a plant based diet. Patient would like to discuss his plan and see if Dr Silvio Pate would be on board with this. EM

## 2020-10-11 ENCOUNTER — Ambulatory Visit (INDEPENDENT_AMBULATORY_CARE_PROVIDER_SITE_OTHER): Payer: Medicare Other | Admitting: Dermatology

## 2020-10-11 ENCOUNTER — Other Ambulatory Visit: Payer: Self-pay

## 2020-10-11 DIAGNOSIS — L570 Actinic keratosis: Secondary | ICD-10-CM

## 2020-10-11 DIAGNOSIS — L821 Other seborrheic keratosis: Secondary | ICD-10-CM | POA: Diagnosis not present

## 2020-10-11 DIAGNOSIS — L578 Other skin changes due to chronic exposure to nonionizing radiation: Secondary | ICD-10-CM

## 2020-10-11 DIAGNOSIS — L820 Inflamed seborrheic keratosis: Secondary | ICD-10-CM

## 2020-10-11 NOTE — Patient Instructions (Signed)
Cryotherapy Aftercare  . Wash gently with soap and water everyday.   Marland Kitchen Apply Vaseline and Band-Aid daily until healed.  Prior to procedure, discussed risks of blister formation, small wound, skin dyspigmentation, or rare scar following cryotherapy.

## 2020-10-11 NOTE — Progress Notes (Signed)
Follow-Up Visit   Subjective  Kevin Terrell is a 80 y.o. male who presents for the following: Follow-up (Patient here today for 3 month AK and ISK follow up. Also for hand dermatitis. ).  Patient advises hands are much better and has not had to use any topicals. He has tacrolimus and TMC 0.1% ointment for flares. Patient does have some spots at chest and a spot at the right cheek which itch and get irritated.  The following portions of the chart were reviewed this encounter and updated as appropriate:   Tobacco  Allergies  Meds  Problems  Med Hx  Surg Hx  Fam Hx      Review of Systems:  No other skin or systemic complaints except as noted in HPI or Assessment and Plan.  Objective  Well appearing patient in no apparent distress; mood and affect are within normal limits.  A focused examination was performed including face, ears, chest, hands, legs. Relevant physical exam findings are noted in the Assessment and Plan.  Objective  glabella x 1, L helix x 1, L lateral cheek x 2, L medial cheek x 1, R cheek x 2, R lat calf x 2, R med calf x 2 (11): Erythematous thin papules/macules with gritty scale.   Objective  R cheek x 1, chest x 8, L shoulder x 1, R shoulder x 1 (11): Erythematous keratotic or waxy stuck-on papule or plaque.    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   Destruction of lesion - glabella x 1, L helix x 1, L lateral cheek x 2, L medial cheek x 1, R cheek x 2, R lat calf x 2, R med calf x 2 Complexity: simple   Destruction method: cryotherapy   Informed consent: discussed and consent obtained   Lesion destroyed using liquid nitrogen: Yes   Cryotherapy cycles:  2 Outcome: patient tolerated procedure well with no  complications   Post-procedure details: wound care instructions given    Inflamed seborrheic keratosis (11) R cheek x 1, chest x 8, L shoulder x 1, R shoulder x 1  Irritated per patient   Destruction of lesion - R cheek x 1, chest x 8, L shoulder x 1, R shoulder x 1 Complexity: simple   Destruction method: cryotherapy   Informed consent: discussed and consent obtained   Lesion destroyed using liquid nitrogen: Yes   Cryotherapy cycles:  2 Outcome: patient tolerated procedure well with no complications   Post-procedure details: wound care instructions given     Actinic Damage - chronic, 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  - Call for new or changing lesions. - May consider PDT or 5FU/calcipotriene field treatment for face in future  Seborrheic Keratoses - Stuck-on, waxy, tan-brown papules and plaques  - Discussed benign etiology and prognosis. - Observe - Call for any changes   Return for TBSE as scheduled.  Graciella Belton, RMA, am acting as scribe for Forest Gleason, MD .  Documentation: I have reviewed the above documentation for accuracy and completeness, and I  agree with the above.  Forest Gleason, MD

## 2020-10-15 ENCOUNTER — Encounter: Payer: Self-pay | Admitting: Dermatology

## 2020-10-30 ENCOUNTER — Ambulatory Visit: Payer: Medicare Other

## 2020-10-30 ENCOUNTER — Other Ambulatory Visit: Payer: Self-pay | Admitting: Internal Medicine

## 2020-10-30 DIAGNOSIS — E039 Hypothyroidism, unspecified: Secondary | ICD-10-CM

## 2020-10-31 ENCOUNTER — Emergency Department: Payer: Medicare Other

## 2020-10-31 ENCOUNTER — Encounter: Payer: Self-pay | Admitting: Emergency Medicine

## 2020-10-31 ENCOUNTER — Inpatient Hospital Stay
Admission: EM | Admit: 2020-10-31 | Discharge: 2020-11-08 | DRG: 417 | Disposition: A | Discharge status: Skilled Nursing Facility | Payer: Medicare Other | Attending: Internal Medicine | Admitting: Internal Medicine

## 2020-10-31 ENCOUNTER — Other Ambulatory Visit: Payer: Self-pay

## 2020-10-31 DIAGNOSIS — E785 Hyperlipidemia, unspecified: Secondary | ICD-10-CM | POA: Diagnosis present

## 2020-10-31 DIAGNOSIS — E876 Hypokalemia: Secondary | ICD-10-CM | POA: Diagnosis present

## 2020-10-31 DIAGNOSIS — E7849 Other hyperlipidemia: Secondary | ICD-10-CM | POA: Diagnosis not present

## 2020-10-31 DIAGNOSIS — M47816 Spondylosis without myelopathy or radiculopathy, lumbar region: Secondary | ICD-10-CM | POA: Diagnosis not present

## 2020-10-31 DIAGNOSIS — R2689 Other abnormalities of gait and mobility: Secondary | ICD-10-CM | POA: Diagnosis not present

## 2020-10-31 DIAGNOSIS — M170 Bilateral primary osteoarthritis of knee: Secondary | ICD-10-CM | POA: Diagnosis present

## 2020-10-31 DIAGNOSIS — K5900 Constipation, unspecified: Secondary | ICD-10-CM | POA: Diagnosis present

## 2020-10-31 DIAGNOSIS — M24012 Loose body in left shoulder: Secondary | ICD-10-CM | POA: Diagnosis not present

## 2020-10-31 DIAGNOSIS — K650 Generalized (acute) peritonitis: Secondary | ICD-10-CM | POA: Diagnosis present

## 2020-10-31 DIAGNOSIS — Z20822 Contact with and (suspected) exposure to covid-19: Secondary | ICD-10-CM | POA: Diagnosis present

## 2020-10-31 DIAGNOSIS — T451X5A Adverse effect of antineoplastic and immunosuppressive drugs, initial encounter: Secondary | ICD-10-CM | POA: Diagnosis present

## 2020-10-31 DIAGNOSIS — G8929 Other chronic pain: Secondary | ICD-10-CM | POA: Diagnosis not present

## 2020-10-31 DIAGNOSIS — Z888 Allergy status to other drugs, medicaments and biological substances status: Secondary | ICD-10-CM

## 2020-10-31 DIAGNOSIS — J301 Allergic rhinitis due to pollen: Secondary | ICD-10-CM | POA: Diagnosis present

## 2020-10-31 DIAGNOSIS — K8689 Other specified diseases of pancreas: Secondary | ICD-10-CM | POA: Diagnosis present

## 2020-10-31 DIAGNOSIS — K851 Biliary acute pancreatitis without necrosis or infection: Principal | ICD-10-CM | POA: Diagnosis present

## 2020-10-31 DIAGNOSIS — K8020 Calculus of gallbladder without cholecystitis without obstruction: Secondary | ICD-10-CM | POA: Diagnosis not present

## 2020-10-31 DIAGNOSIS — K8010 Calculus of gallbladder with chronic cholecystitis without obstruction: Secondary | ICD-10-CM | POA: Diagnosis not present

## 2020-10-31 DIAGNOSIS — E039 Hypothyroidism, unspecified: Secondary | ICD-10-CM | POA: Diagnosis not present

## 2020-10-31 DIAGNOSIS — M533 Sacrococcygeal disorders, not elsewhere classified: Secondary | ICD-10-CM | POA: Diagnosis not present

## 2020-10-31 DIAGNOSIS — Z79899 Other long term (current) drug therapy: Secondary | ICD-10-CM

## 2020-10-31 DIAGNOSIS — E871 Hypo-osmolality and hyponatremia: Secondary | ICD-10-CM | POA: Diagnosis present

## 2020-10-31 DIAGNOSIS — D72819 Decreased white blood cell count, unspecified: Secondary | ICD-10-CM | POA: Diagnosis not present

## 2020-10-31 DIAGNOSIS — R0902 Hypoxemia: Secondary | ICD-10-CM | POA: Diagnosis not present

## 2020-10-31 DIAGNOSIS — R188 Other ascites: Secondary | ICD-10-CM | POA: Diagnosis not present

## 2020-10-31 DIAGNOSIS — M545 Low back pain, unspecified: Secondary | ICD-10-CM | POA: Diagnosis not present

## 2020-10-31 DIAGNOSIS — Z48815 Encounter for surgical aftercare following surgery on the digestive system: Secondary | ICD-10-CM | POA: Diagnosis not present

## 2020-10-31 DIAGNOSIS — R278 Other lack of coordination: Secondary | ICD-10-CM | POA: Diagnosis not present

## 2020-10-31 DIAGNOSIS — M5137 Other intervertebral disc degeneration, lumbosacral region: Secondary | ICD-10-CM | POA: Diagnosis not present

## 2020-10-31 DIAGNOSIS — R109 Unspecified abdominal pain: Secondary | ICD-10-CM | POA: Diagnosis not present

## 2020-10-31 DIAGNOSIS — E860 Dehydration: Secondary | ICD-10-CM | POA: Diagnosis present

## 2020-10-31 DIAGNOSIS — Z85118 Personal history of other malignant neoplasm of bronchus and lung: Secondary | ICD-10-CM

## 2020-10-31 DIAGNOSIS — R590 Localized enlarged lymph nodes: Secondary | ICD-10-CM | POA: Diagnosis present

## 2020-10-31 DIAGNOSIS — D649 Anemia, unspecified: Secondary | ICD-10-CM | POA: Diagnosis present

## 2020-10-31 DIAGNOSIS — I700 Atherosclerosis of aorta: Secondary | ICD-10-CM | POA: Diagnosis present

## 2020-10-31 DIAGNOSIS — M542 Cervicalgia: Secondary | ICD-10-CM | POA: Diagnosis not present

## 2020-10-31 DIAGNOSIS — Z741 Need for assistance with personal care: Secondary | ICD-10-CM | POA: Diagnosis not present

## 2020-10-31 DIAGNOSIS — Z9049 Acquired absence of other specified parts of digestive tract: Secondary | ICD-10-CM | POA: Diagnosis not present

## 2020-10-31 DIAGNOSIS — N5082 Scrotal pain: Secondary | ICD-10-CM

## 2020-10-31 DIAGNOSIS — K5750 Diverticulosis of both small and large intestine without perforation or abscess without bleeding: Secondary | ICD-10-CM | POA: Diagnosis present

## 2020-10-31 DIAGNOSIS — Z9221 Personal history of antineoplastic chemotherapy: Secondary | ICD-10-CM

## 2020-10-31 DIAGNOSIS — Z902 Acquired absence of lung [part of]: Secondary | ICD-10-CM

## 2020-10-31 DIAGNOSIS — K659 Peritonitis, unspecified: Secondary | ICD-10-CM | POA: Diagnosis not present

## 2020-10-31 DIAGNOSIS — M19012 Primary osteoarthritis, left shoulder: Secondary | ICD-10-CM | POA: Diagnosis not present

## 2020-10-31 DIAGNOSIS — I361 Nonrheumatic tricuspid (valve) insufficiency: Secondary | ICD-10-CM | POA: Diagnosis not present

## 2020-10-31 DIAGNOSIS — K859 Acute pancreatitis without necrosis or infection, unspecified: Secondary | ICD-10-CM

## 2020-10-31 DIAGNOSIS — I340 Nonrheumatic mitral (valve) insufficiency: Secondary | ICD-10-CM | POA: Diagnosis not present

## 2020-10-31 DIAGNOSIS — R55 Syncope and collapse: Secondary | ICD-10-CM | POA: Diagnosis present

## 2020-10-31 DIAGNOSIS — M25512 Pain in left shoulder: Secondary | ICD-10-CM | POA: Diagnosis not present

## 2020-10-31 DIAGNOSIS — K219 Gastro-esophageal reflux disease without esophagitis: Secondary | ICD-10-CM | POA: Diagnosis not present

## 2020-10-31 DIAGNOSIS — K7689 Other specified diseases of liver: Secondary | ICD-10-CM | POA: Diagnosis not present

## 2020-10-31 DIAGNOSIS — K5730 Diverticulosis of large intestine without perforation or abscess without bleeding: Secondary | ICD-10-CM | POA: Diagnosis not present

## 2020-10-31 DIAGNOSIS — G620 Drug-induced polyneuropathy: Secondary | ICD-10-CM | POA: Diagnosis present

## 2020-10-31 DIAGNOSIS — M62838 Other muscle spasm: Secondary | ICD-10-CM | POA: Diagnosis not present

## 2020-10-31 DIAGNOSIS — K7460 Unspecified cirrhosis of liver: Secondary | ICD-10-CM

## 2020-10-31 DIAGNOSIS — Z79890 Hormone replacement therapy (postmenopausal): Secondary | ICD-10-CM

## 2020-10-31 LAB — CBC WITH DIFFERENTIAL/PLATELET
Abs Immature Granulocytes: 0.06 10*3/uL (ref 0.00–0.07)
Basophils Absolute: 0 10*3/uL (ref 0.0–0.1)
Basophils Relative: 0 %
Eosinophils Absolute: 0 10*3/uL (ref 0.0–0.5)
Eosinophils Relative: 0 %
HCT: 42.4 % (ref 39.0–52.0)
Hemoglobin: 13.9 g/dL (ref 13.0–17.0)
Immature Granulocytes: 1 %
Lymphocytes Relative: 11 %
Lymphs Abs: 1.1 10*3/uL (ref 0.7–4.0)
MCH: 30.4 pg (ref 26.0–34.0)
MCHC: 32.8 g/dL (ref 30.0–36.0)
MCV: 92.8 fL (ref 80.0–100.0)
Monocytes Absolute: 0.9 10*3/uL (ref 0.1–1.0)
Monocytes Relative: 8 %
Neutro Abs: 8.6 10*3/uL — ABNORMAL HIGH (ref 1.7–7.7)
Neutrophils Relative %: 80 %
Platelets: 146 10*3/uL — ABNORMAL LOW (ref 150–400)
RBC: 4.57 MIL/uL (ref 4.22–5.81)
RDW: 12.1 % (ref 11.5–15.5)
WBC: 10.6 10*3/uL — ABNORMAL HIGH (ref 4.0–10.5)
nRBC: 0 % (ref 0.0–0.2)

## 2020-10-31 LAB — LIPID PANEL
Cholesterol: 117 mg/dL (ref 0–200)
HDL: 42 mg/dL (ref >40)
LDL Cholesterol: 66 mg/dL (ref 0–99)
Total CHOL/HDL Ratio: 2.8 RATIO
Triglycerides: 44 mg/dL (ref <150)
VLDL: 9 mg/dL (ref 0–40)

## 2020-10-31 LAB — COMPREHENSIVE METABOLIC PANEL
ALT: 11 U/L (ref 0–44)
AST: 26 U/L (ref 15–41)
Albumin: 4.2 g/dL (ref 3.5–5.0)
Alkaline Phosphatase: 54 U/L (ref 38–126)
Anion gap: 11 (ref 5–15)
BUN: 16 mg/dL (ref 8–23)
CO2: 23 mmol/L (ref 22–32)
Calcium: 9.1 mg/dL (ref 8.9–10.3)
Chloride: 101 mmol/L (ref 98–111)
Creatinine, Ser: 1 mg/dL (ref 0.61–1.24)
GFR, Estimated: >60 mL/min (ref >60)
Glucose, Bld: 118 mg/dL — ABNORMAL HIGH (ref 70–99)
Potassium: 5.1 mmol/L (ref 3.5–5.1)
Sodium: 135 mmol/L (ref 135–145)
Total Bilirubin: 1.3 mg/dL — ABNORMAL HIGH (ref 0.3–1.2)
Total Protein: 7.1 g/dL (ref 6.5–8.1)

## 2020-10-31 LAB — URINALYSIS, COMPLETE (UACMP) WITH MICROSCOPIC
Bacteria, UA: NONE SEEN
Bilirubin Urine: NEGATIVE
Glucose, UA: NEGATIVE mg/dL
Hgb urine dipstick: NEGATIVE
Ketones, ur: 5 mg/dL — AB
Leukocytes,Ua: NEGATIVE
Nitrite: NEGATIVE
Protein, ur: NEGATIVE mg/dL
Specific Gravity, Urine: >1.046 — ABNORMAL HIGH (ref 1.005–1.030)
pH: 8 (ref 5.0–8.0)

## 2020-10-31 LAB — TROPONIN I (HIGH SENSITIVITY)
Troponin I (High Sensitivity): 3 ng/L (ref <18)
Troponin I (High Sensitivity): 3 ng/L (ref <18)

## 2020-10-31 LAB — LIPASE, BLOOD: Lipase: 2209 U/L — ABNORMAL HIGH (ref 11–51)

## 2020-10-31 IMAGING — US US SCROTUM W/ DOPPLER COMPLETE
1 series · 14 of 25 positions shown · non-contrast
Comparison: None.

CLINICAL DATA: Bilateral scrotal pain

EXAM:
SCROTAL ULTRASOUND
DOPPLER ULTRASOUND OF THE TESTICLES
TECHNIQUE: Complete ultrasound examination of the testicles, epididymis, and
other scrotal structures was performed. Color and spectral Doppler
ultrasound were also utilized to evaluate blood flow to the
testicles.

[Series 1: us scrotum w/doppler · 14 of 62 slices shown]
[im 1/62]
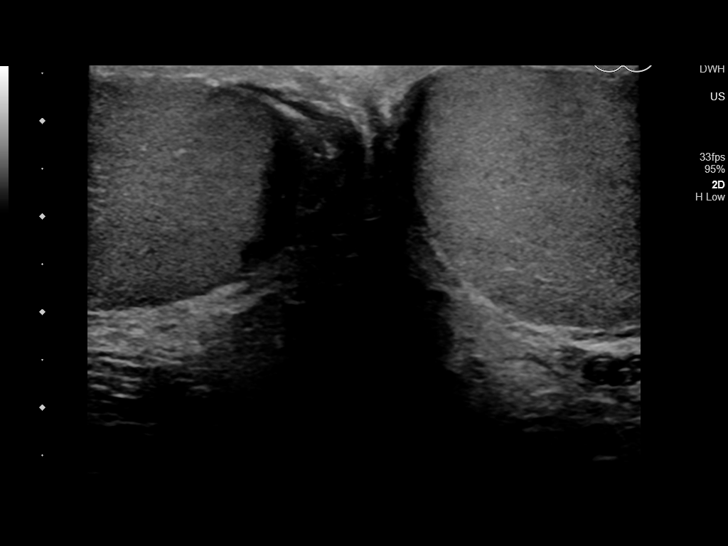
[im 6/62]
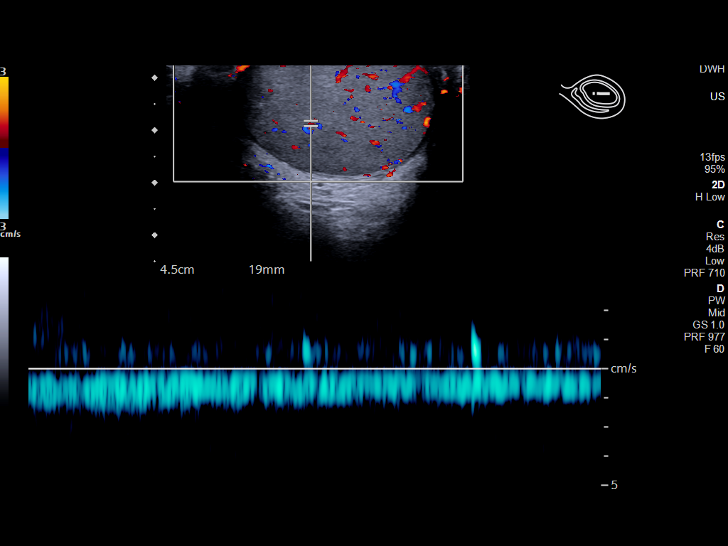
[im 11/62]
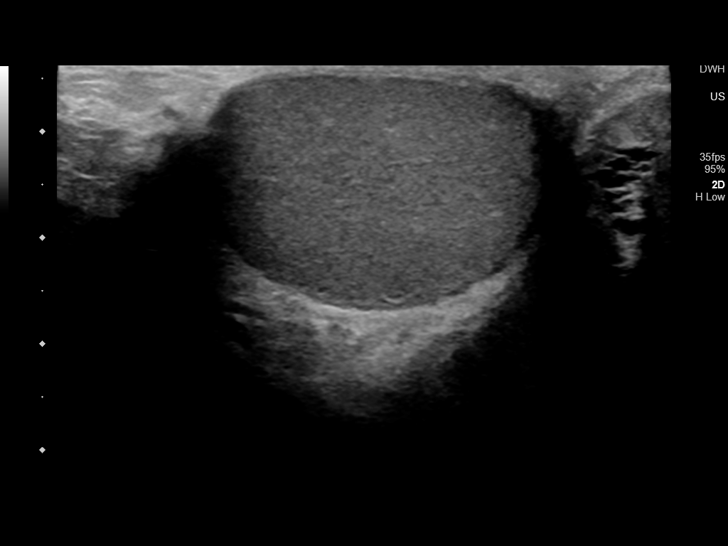
[im 16/62]
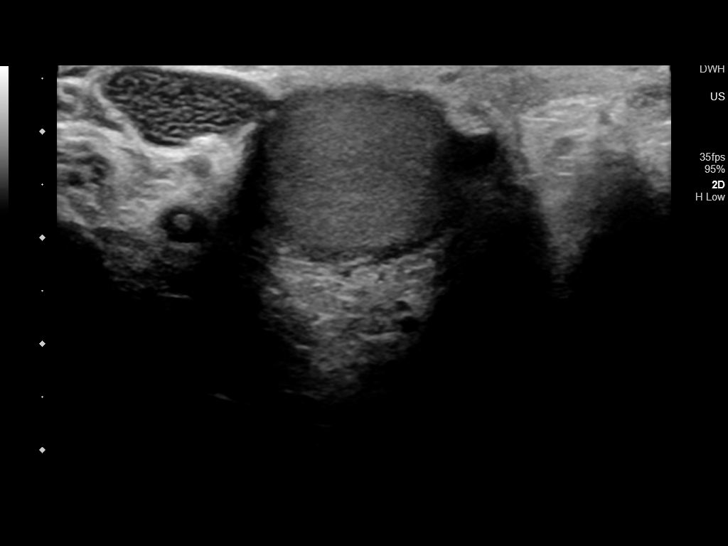
[im 21/62]
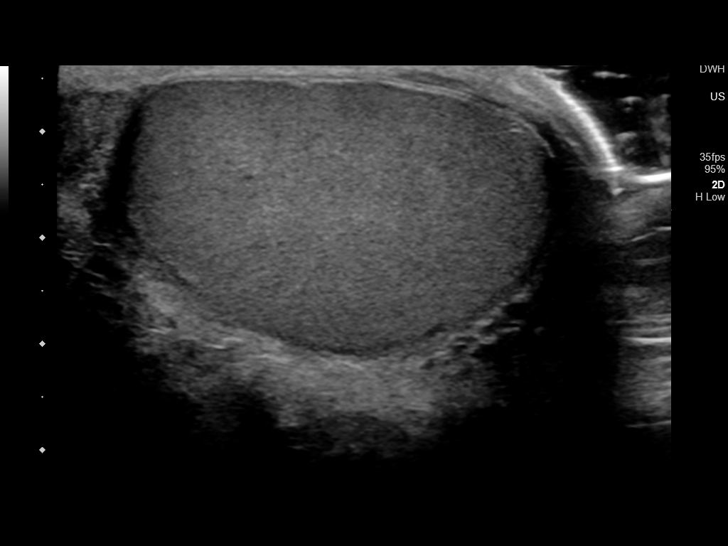
[im 23/62]
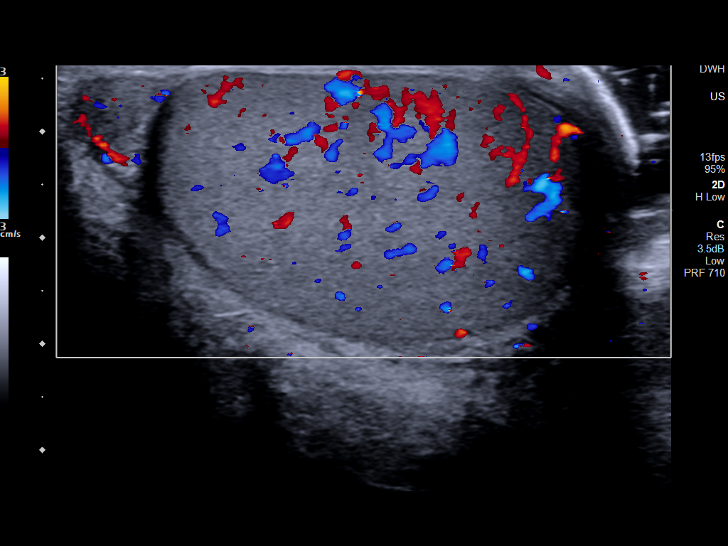
[im 28/62]
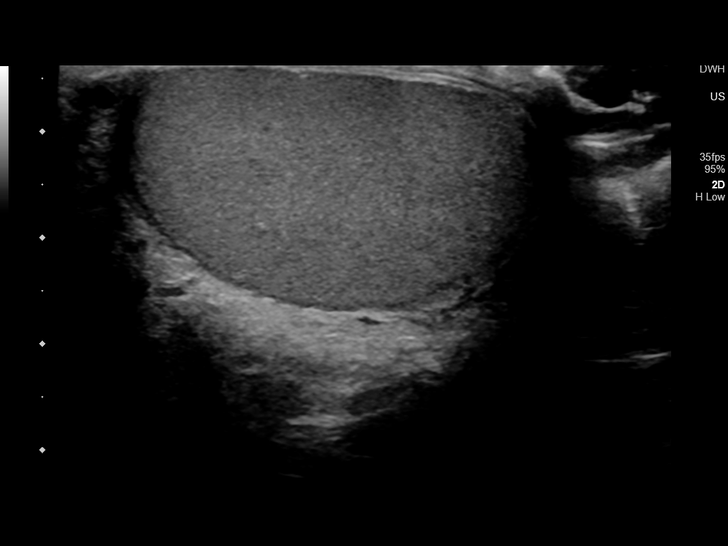
[im 34/62]
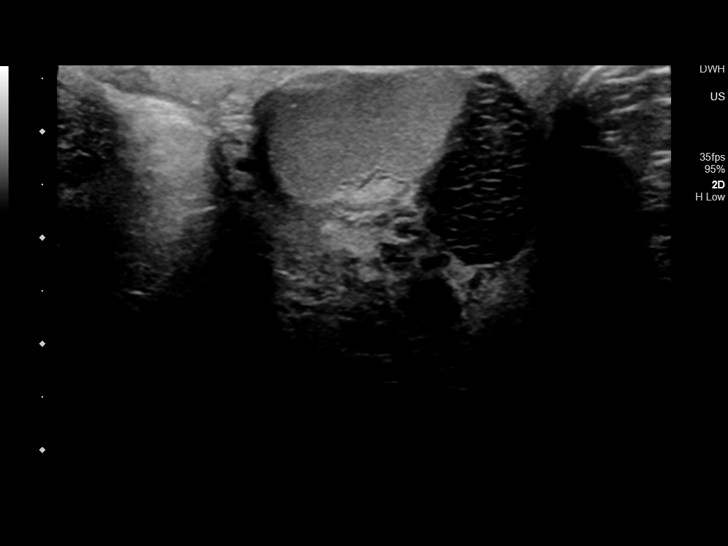
[im 39/62]
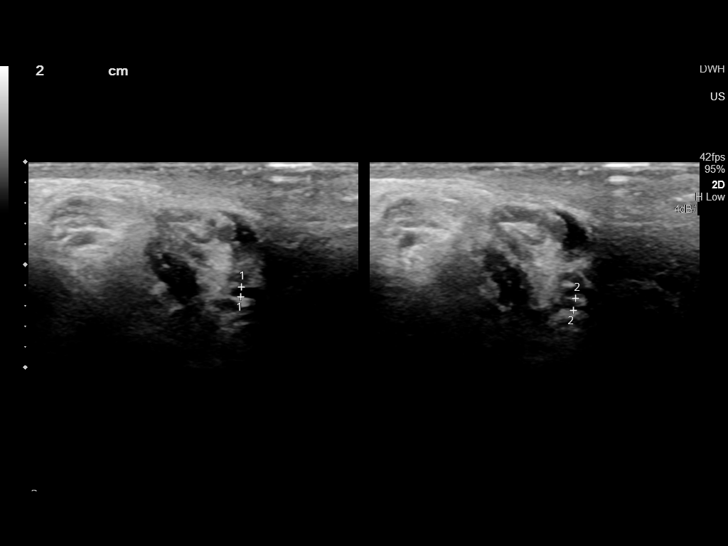
[im 41/62]
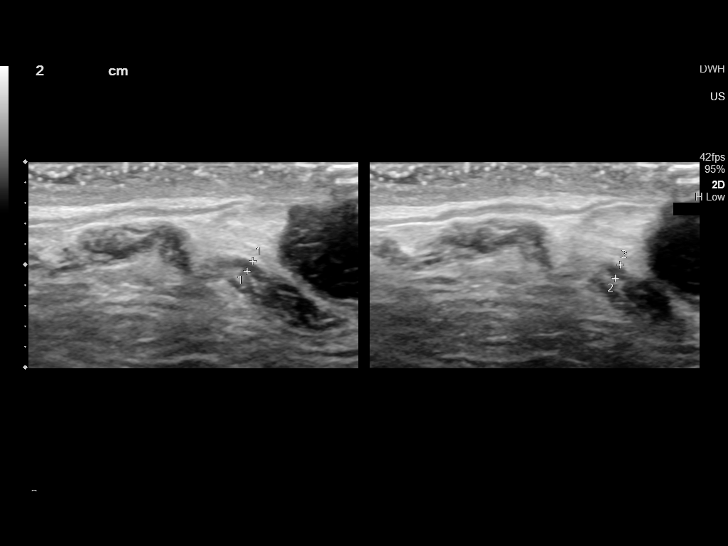
[im 46/62]
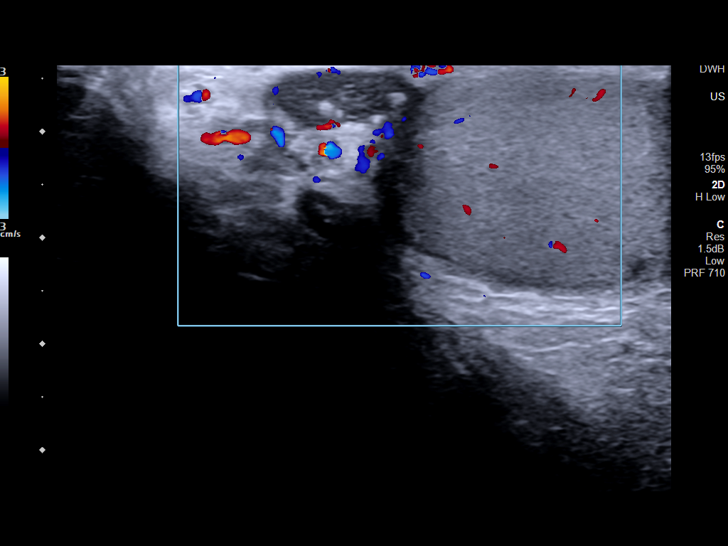
[im 51/62]
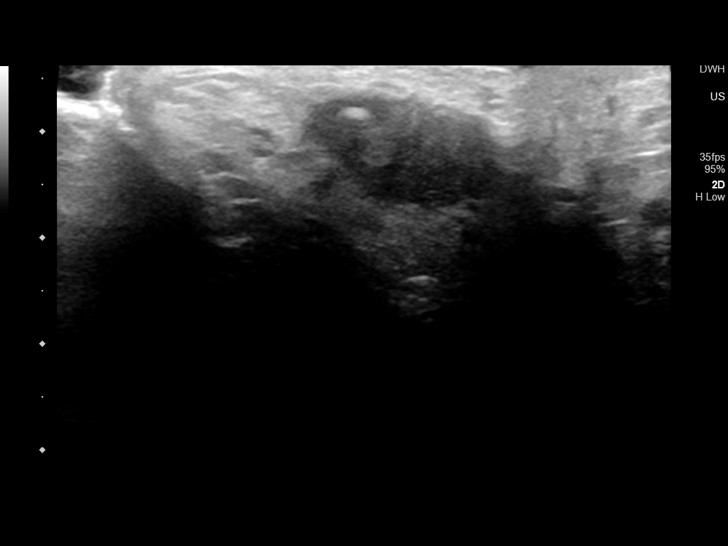
[im 56/62]
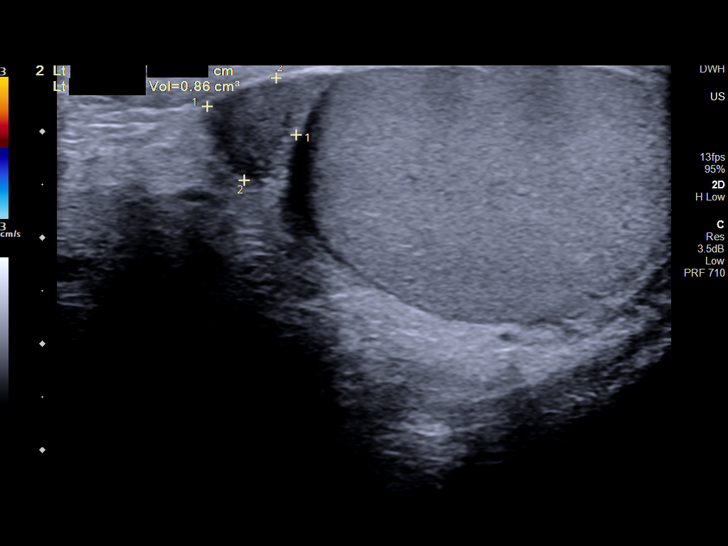
[im 62/62]
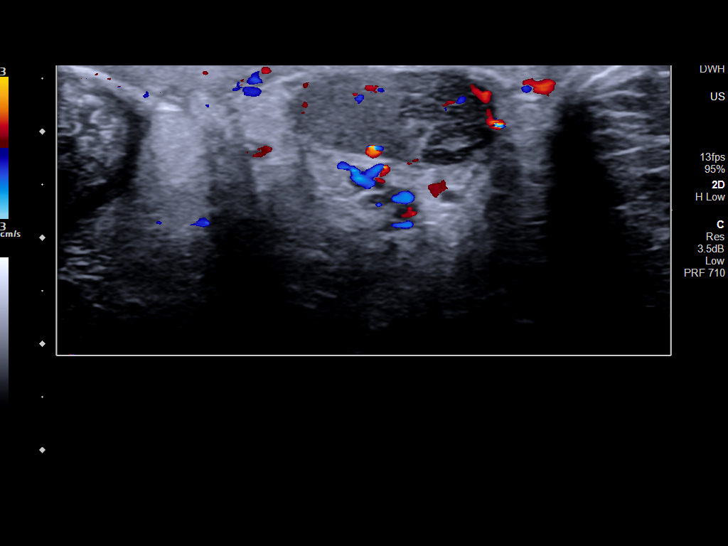

[14 of 25 positions shown; findings below may reference images not displayed]

FINDINGS: Right testicle

Measurements: 3.7 x 2.4 x 3.2 cm. No mass or microlithiasis
visualized.

Left testicle

Measurements: 4.0 x 2.5 x 2.8 cm. No mass or microlithiasis
visualized.

Right epididymis: Normal in size. Slightly heterogeneous in
appearance. Small scattered calcifications are seen.

Left epididymis: Normal in size. Slightly heterogeneous in
appearance.

Hydrocele:  None visualized.

Varicocele:  None visualized.

Pulsed Doppler interrogation of both testes demonstrates normal low
resistance arterial and venous waveforms bilaterally.
IMPRESSION: Slightly heterogeneous bilateral epididymi, however no definite
evidence of epididymitis.

Normal appearing testicles.

## 2020-10-31 IMAGING — CR DG CHEST 2V
2 series · 2 of 2 positions shown · non-contrast
Comparison: None.

CLINICAL DATA: Syncope

EXAM:
CHEST - 2 VIEW

[chest lat]
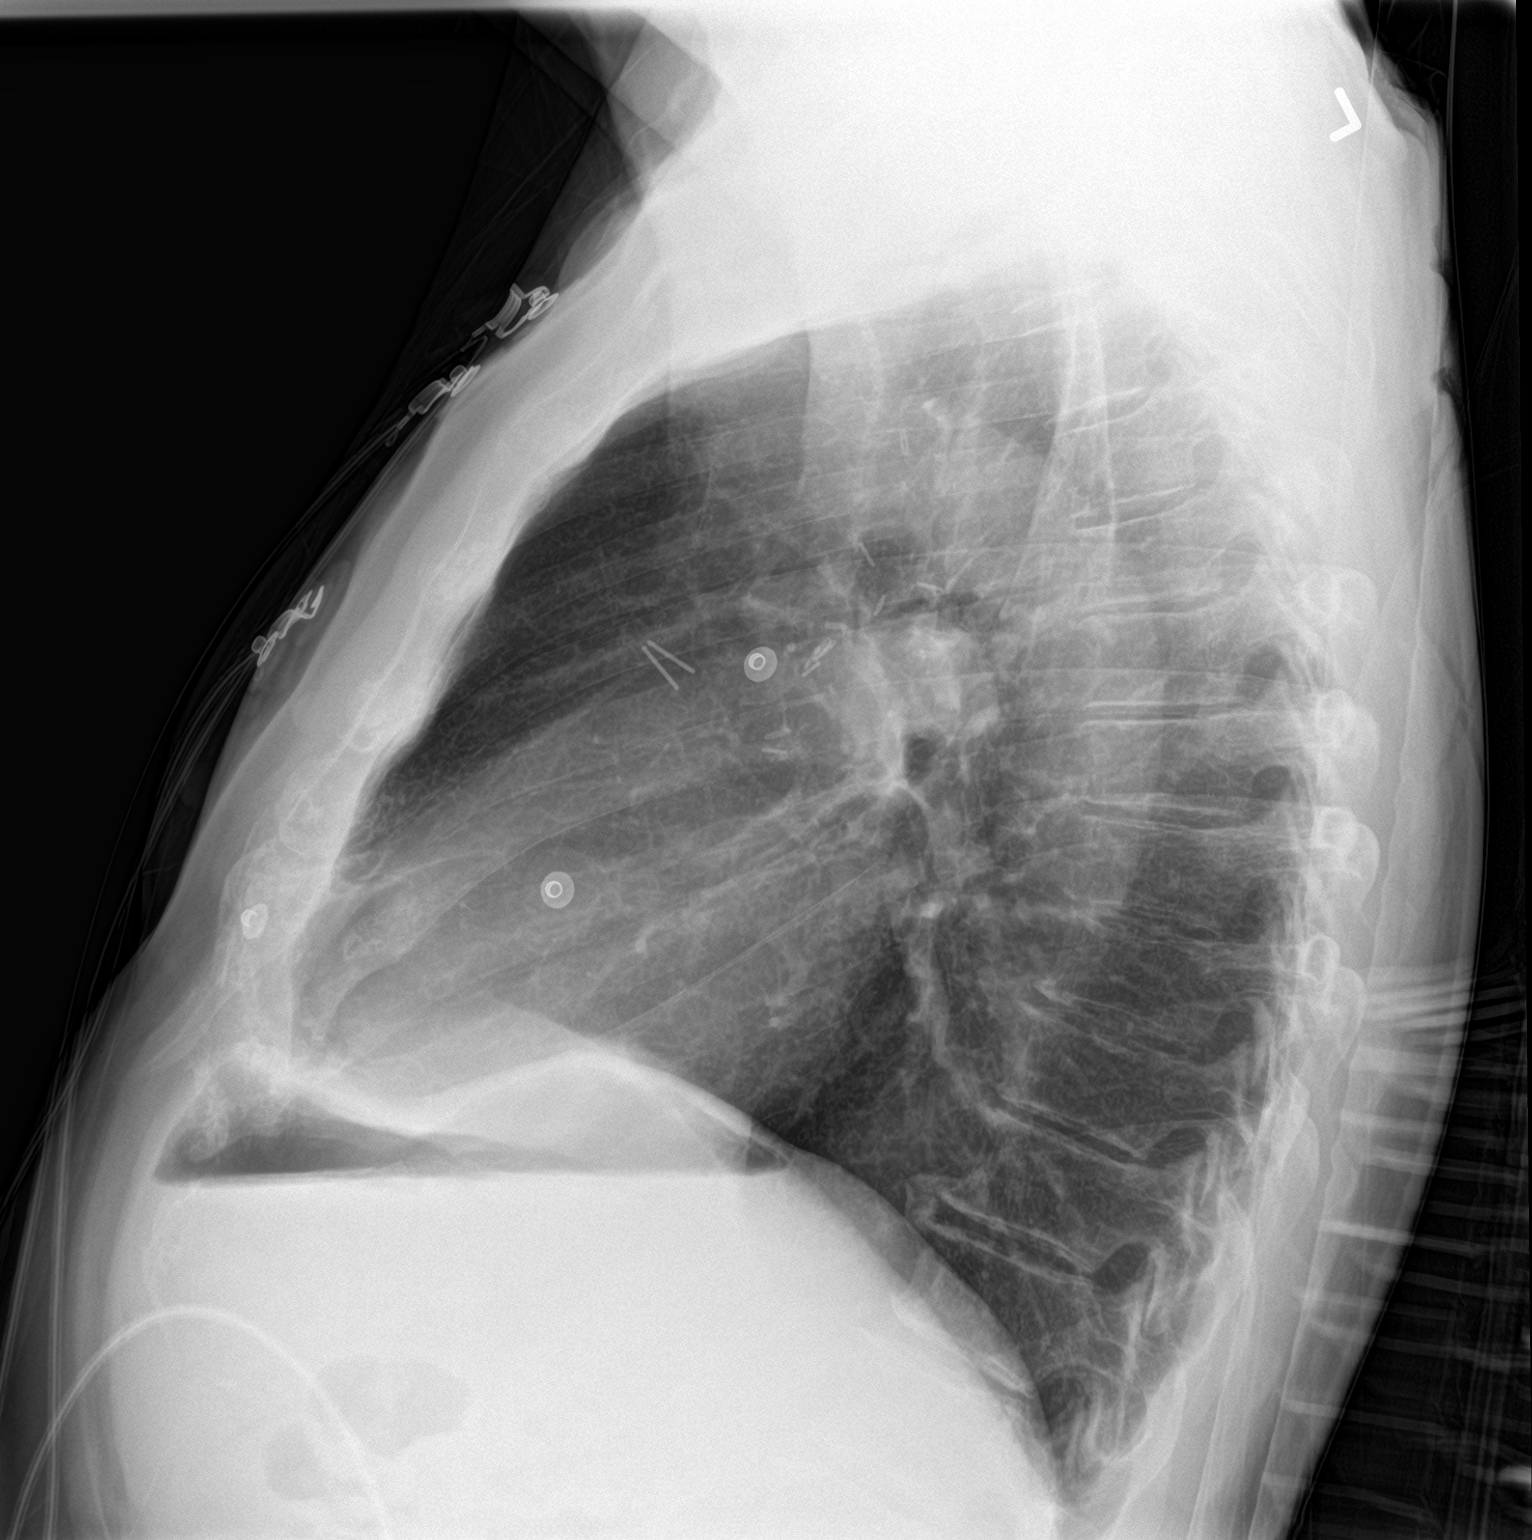

[chest ap]
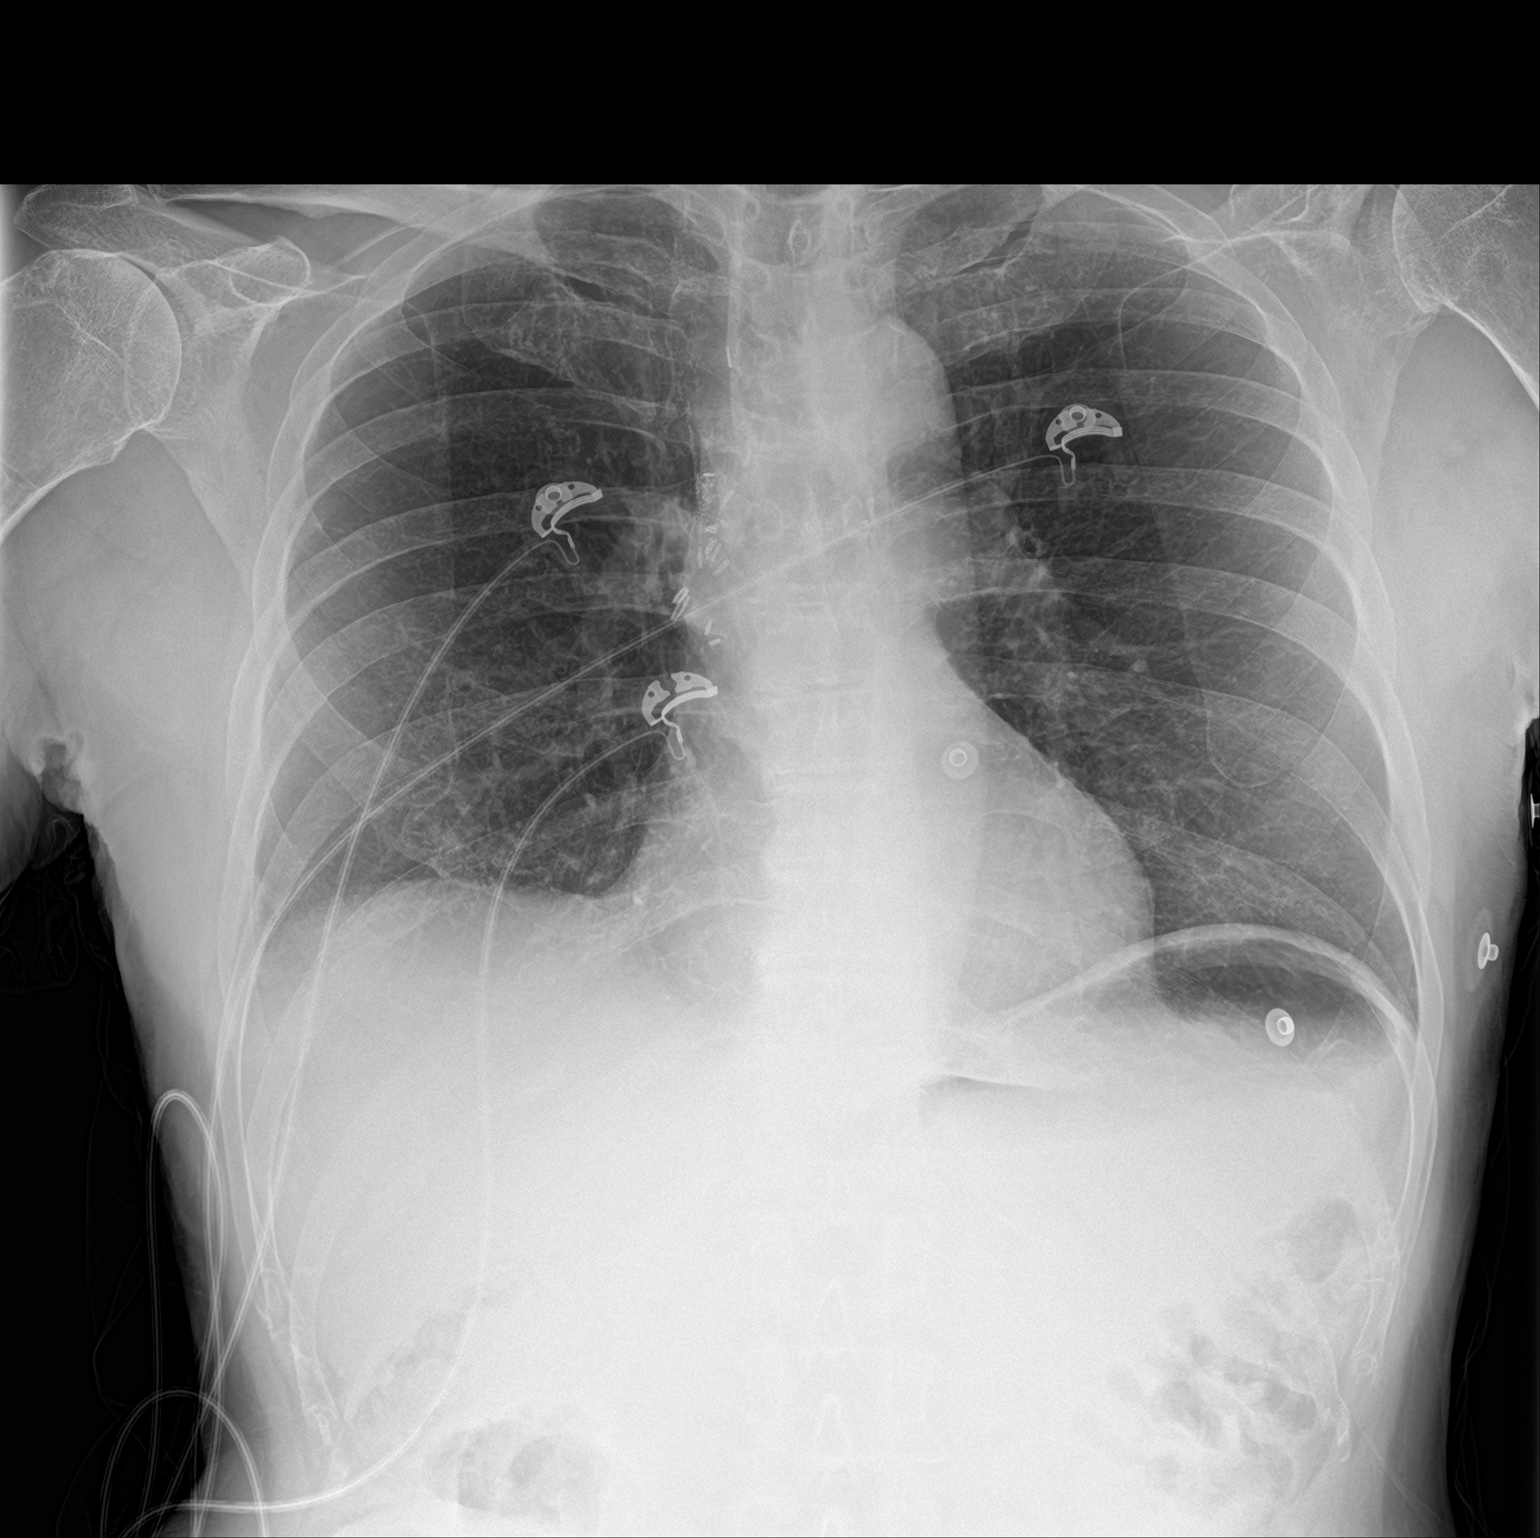

[2 of 2 positions shown; findings below may reference images not displayed]

FINDINGS: Heart size and mediastinal contours are within normal limits. Lungs
are clear. No pleural effusion or pneumothorax is seen. No acute
appearing osseous abnormality.
IMPRESSION: No active cardiopulmonary disease.

## 2020-10-31 IMAGING — CT CT ABD-PELV W/ CM
2 of 5 series · 15 of 46 positions shown, 17 images · IV contrast (omnipaque)
Comparison: None.

CLINICAL DATA: Abdominal pain.

EXAM:
CT ABDOMEN AND PELVIS WITH CONTRAST
TECHNIQUE: Multidetector CT imaging of the abdomen and pelvis was performed
using the standard protocol following bolus administration of
intravenous contrast.
CONTRAST:  100mL OMNIPAQUE IOHEXOL 300 MG/ML  SOLN

[Series 2: routine abd/pel with · axial · 0.81mm/px · z∈[-967,-542]mm · 12 of 95 slices shown, 14 images]
[im 5/95  soft-tissue]
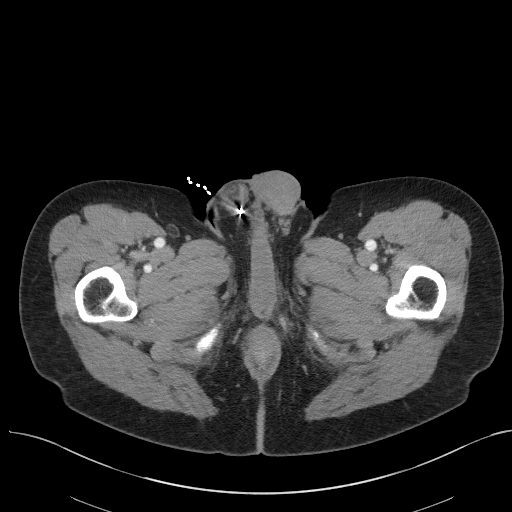
[im 5/95  bone]
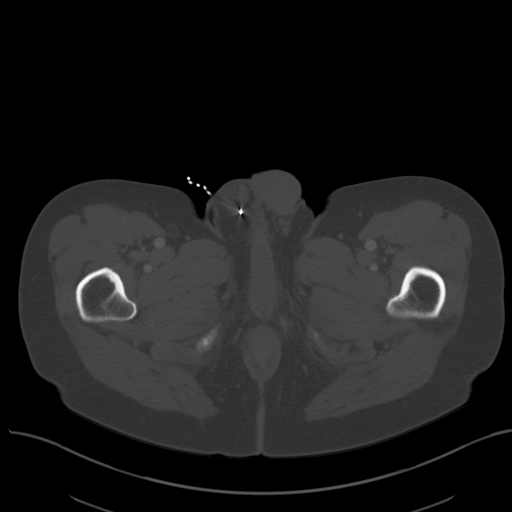
[im 15/95  soft-tissue]
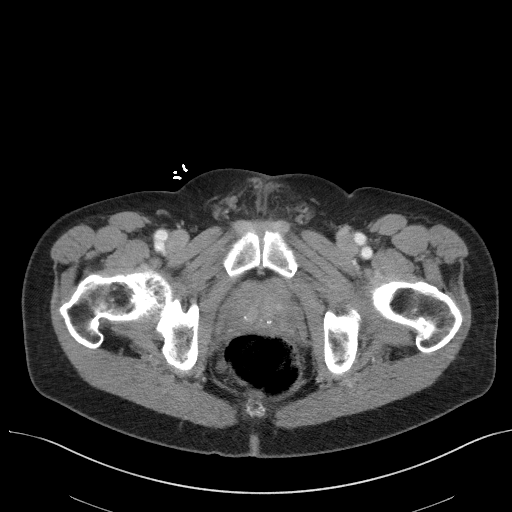
[im 20/95  soft-tissue]
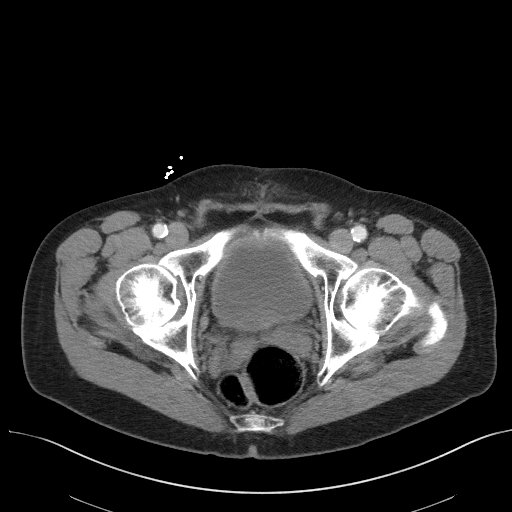
[im 30/95  soft-tissue]
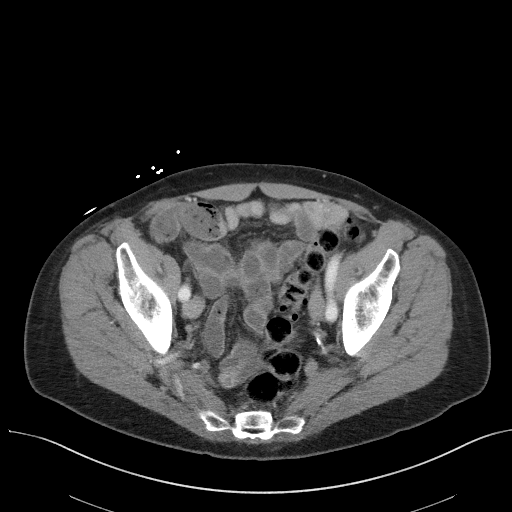
[im 35/95  soft-tissue]
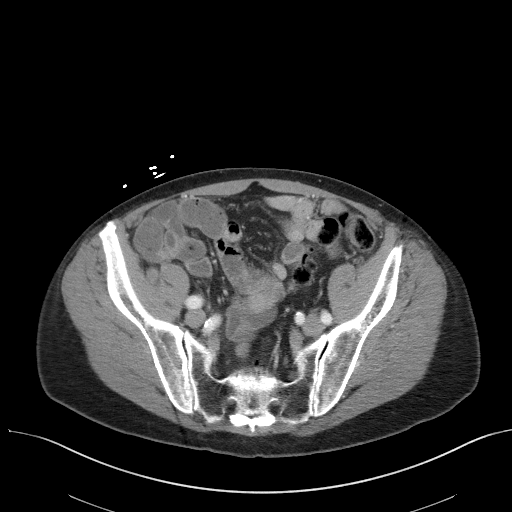
[im 45/95  soft-tissue]
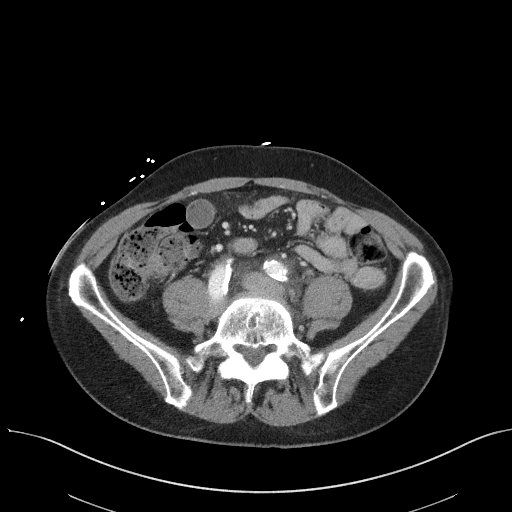
[im 50/95  soft-tissue]
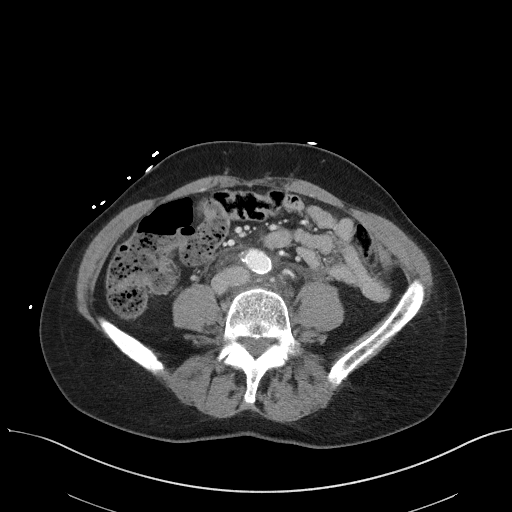
[im 60/95  soft-tissue]
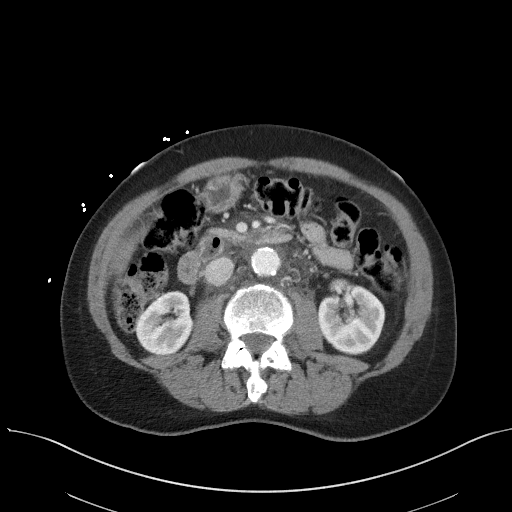
[im 65/95  soft-tissue]
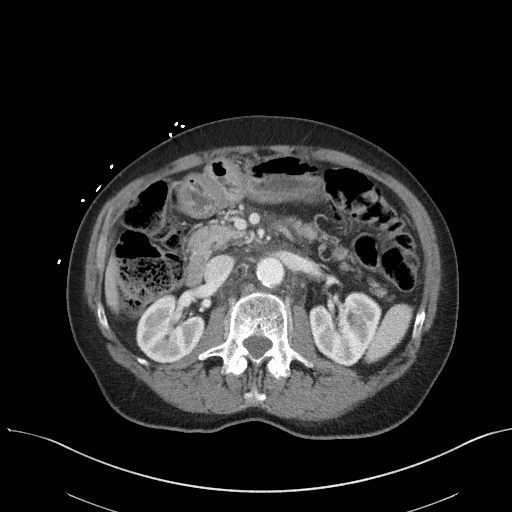
[im 65/95  bone]
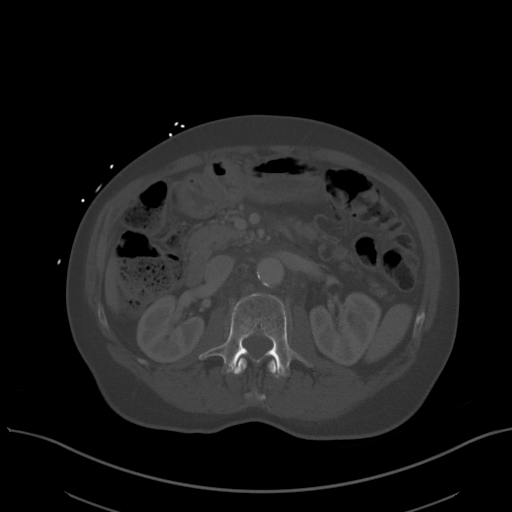
[im 75/95  soft-tissue]
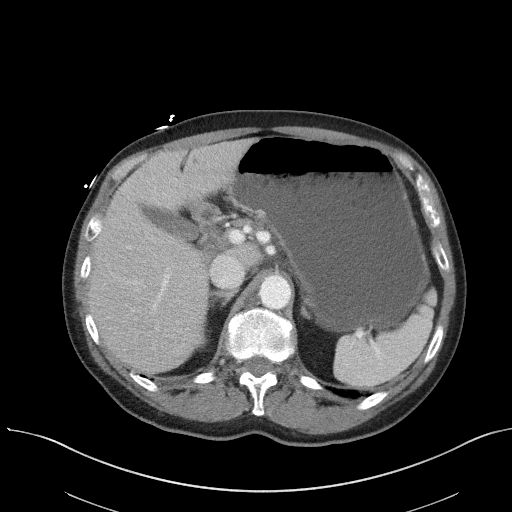
[im 80/95  soft-tissue]
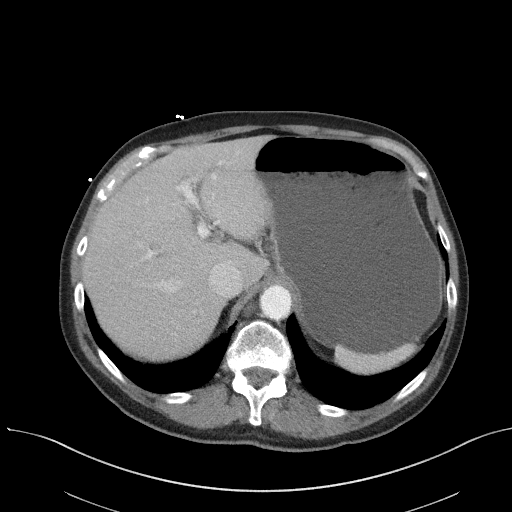
[im 90/95  soft-tissue]
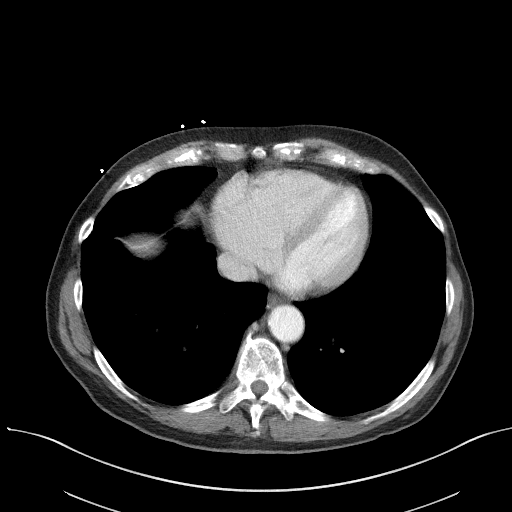

[Series 5: coronal st · coronal · 0.78mm/px · 3 of 88 slices shown]
[im 30/88  soft-tissue]
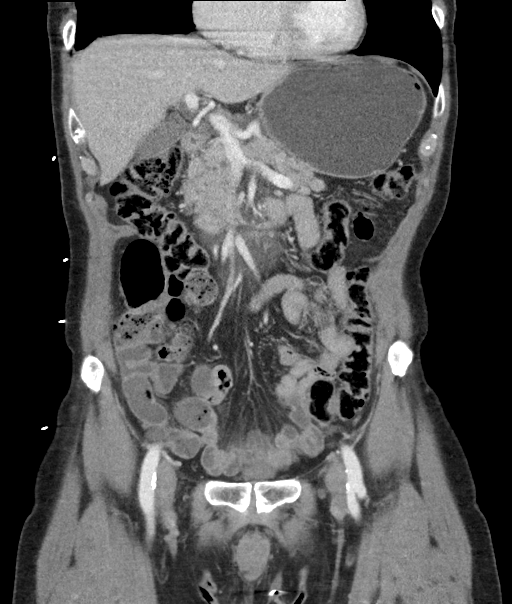
[im 39/88  soft-tissue]
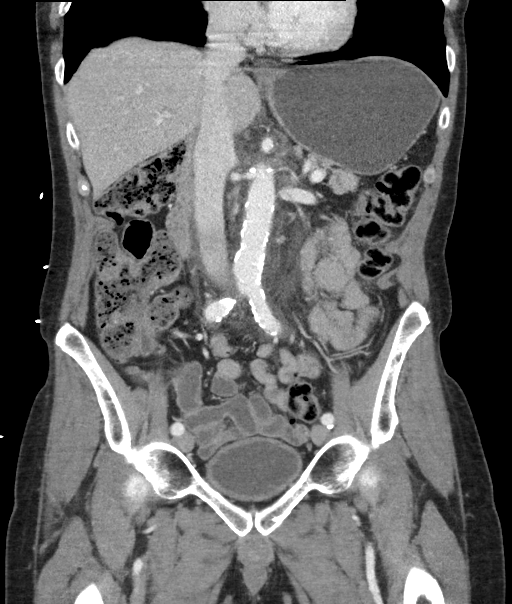
[im 49/88  soft-tissue]
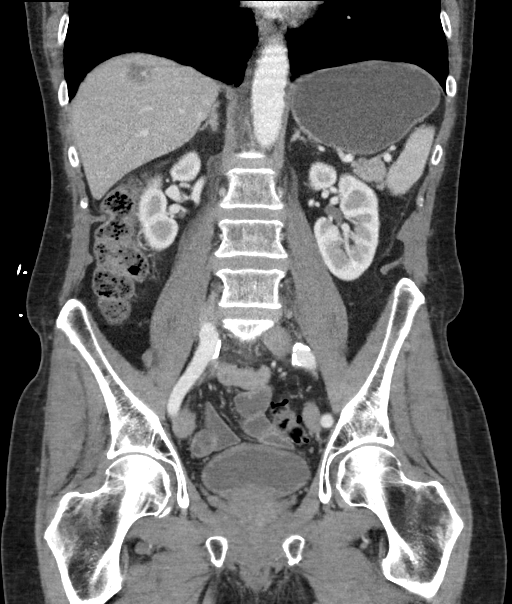

[15 of 46 positions shown; findings below may reference images not displayed]

FINDINGS: Lower chest: No acute abnormality.

Hepatobiliary: 2 cm hypodense lesion within the posterior-superior
RIGHT liver lobe, indeterminate but most suggestive of a benign
hemangioma. Gallbladder appears normal. No bile duct dilatation is
seen.

Pancreas: Pancreatic duct is dilated to approximately 6 mm. No
obstructing mass or stone is identified within the pancreas.

Spleen: Normal in size without focal abnormality.

Adrenals/Urinary Tract: Adrenal glands appear normal. Kidneys are
unremarkable without mass, stone or hydronephrosis. No ureteral or
bladder calculi are identified.

Stomach/Bowel: No dilated large or small bowel loops are seen. No
evidence of bowel wall inflammation. Appendix is normal.

Vascular/Lymphatic: Aortic atherosclerosis.

Reproductive: Prostate gland is mildly prominent in size.

Other: There is an ill-defined/hazy density throughout the
periaortic retroperitoneum with envelopment of both the aorta and
portions of the inferior vena cava and with extension anteriorly
about the superior mesenteric vessels (and proximal branches), and
with scattered mildly prominent lymph nodes. This is new compared to
earlier chest CT of [DATE].

Walls of the adjacent abdominal aorta are unremarkable, without
obvious wall thickening or wall enhancement to suggest aortitis.

Musculoskeletal: No acute appearing osseous abnormality. Lucency
within the L5 vertebral body is likely related to overlying
degenerative disc disease.
IMPRESSION: 1. Extensive ill-defined/hazy density throughout the periaortic
retroperitoneum, with envelopment of both the aorta and portions of
the inferior vena cava, and with extension anteriorly about the
superior mesenteric vessels (and proximal branches), and containing
scattered mildly prominent lymph nodes. This is new compared to
earlier chest CT of [DATE]. Differential considerations include
but are not limited to rapidly developing retroperitoneal fibrosis
and neoplastic process such as lymphoma. Aortitis is considered much
less likely due to the distribution and the lack of aortic wall
thickening. Consider further characterization with PET-CT. Surgical
exploration with biopsy may eventually be needed for definitive
characterization.
2. Pancreatic duct is dilated to approximately 6 mm. No obstructing
mass or stone is identified within the pancreas. No fluid about the
pancreas itself to suggest acute pancreatitis. Recommend further
characterization with nonemergent pancreatic MRI.
3. 2 cm hypodense lesion within the posterior-superior RIGHT liver
lobe, indeterminate but most suggestive of a benign hemangioma.
Consider confirmation with liver MRI.

Aortic Atherosclerosis ([S2]-[S2]).

## 2020-10-31 MED ORDER — HYDROMORPHONE HCL 1 MG/ML IJ SOLN
0.5000 mg | INTRAMUSCULAR | Status: DC | PRN
Start: 2020-10-31 — End: 2020-11-01
  Administered 2020-11-01: 0.5 mg via INTRAVENOUS
  Filled 2020-10-31: qty 1

## 2020-10-31 MED ORDER — ALUM & MAG HYDROXIDE-SIMETH 200-200-20 MG/5ML PO SUSP
30.0000 mL | Freq: Once | ORAL | Status: AC
  Administered 2020-10-31: 30 mL via ORAL
  Filled 2020-10-31: qty 30

## 2020-10-31 MED ORDER — IOHEXOL 300 MG/ML  SOLN
100.0000 mL | Freq: Once | INTRAMUSCULAR | Status: AC | PRN
  Administered 2020-10-31: 100 mL via INTRAVENOUS

## 2020-10-31 MED ORDER — FENTANYL CITRATE (PF) 100 MCG/2ML IJ SOLN
50.0000 ug | Freq: Once | INTRAMUSCULAR | Status: AC
  Administered 2020-10-31: 50 ug via INTRAVENOUS
  Filled 2020-10-31: qty 2

## 2020-10-31 MED ORDER — HYDROCODONE-ACETAMINOPHEN 5-325 MG PO TABS
1.0000 | ORAL_TABLET | Freq: Once | ORAL | Status: AC
  Administered 2020-10-31: 1 via ORAL
  Filled 2020-10-31: qty 1

## 2020-10-31 MED ORDER — PANTOPRAZOLE SODIUM 40 MG PO TBEC
40.0000 mg | DELAYED_RELEASE_TABLET | Freq: Once | ORAL | Status: AC
  Administered 2020-10-31: 40 mg via ORAL
  Filled 2020-10-31: qty 1

## 2020-10-31 MED ORDER — ONDANSETRON HCL 4 MG/2ML IJ SOLN
4.0000 mg | Freq: Once | INTRAMUSCULAR | Status: AC
  Administered 2020-10-31: 4 mg via INTRAVENOUS
  Filled 2020-10-31: qty 2

## 2020-10-31 MED ORDER — LACTATED RINGERS IV BOLUS
1000.0000 mL | Freq: Once | INTRAVENOUS | Status: AC
  Administered 2020-10-31: 1000 mL via INTRAVENOUS

## 2020-10-31 MED ORDER — FLEET ENEMA 7-19 GM/118ML RE ENEM
1.0000 | ENEMA | Freq: Once | RECTAL | Status: AC
  Administered 2020-10-31: 1 via RECTAL

## 2020-10-31 MED ORDER — SUCRALFATE 1 G PO TABS
1.0000 g | ORAL_TABLET | ORAL | Status: AC
  Administered 2020-10-31: 1 g via ORAL
  Filled 2020-10-31: qty 1

## 2020-10-31 MED ORDER — HYDROMORPHONE HCL 1 MG/ML IJ SOLN
0.5000 mg | Freq: Once | INTRAMUSCULAR | Status: AC
  Administered 2020-10-31: 0.5 mg via INTRAVENOUS
  Filled 2020-10-31: qty 1

## 2020-10-31 NOTE — Discharge Instructions (Addendum)
Your CT Abdomen & Pelvis today showed: IMPRESSION: 1. 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 This is new compared to earlier chest CT of 11/28/2019. Differential considerations include but are not limited to rapidly developing retroperitoneal fibrosis and neoplastic process such as lymphoma. Aortitis is considered much less likely due to the distribution and the lack of aortic wall thickening. Consider further characterization with PET-CT. Surgical exploration with biopsy may eventually be needed for definitive characterization. 2. Pancreatic duct is dilated to approximately 6 mm. No obstructing mass or stone is identified within the pancreas. No fluid about the pancreas itself to suggest acute pancreatitis. Recommend further characterization with nonemergent pancreatic MRI. 3. 2 cm hypodense lesion within the posterior-superior RIGHT liver lobe, indeterminate but most suggestive of a benign hemangioma. Consider confirmation with liver MRI. Aortic Atherosclerosis (ICD10-I70.0).

## 2020-10-31 NOTE — ED Provider Notes (Signed)
[facility] Department Provider Note  ____________________________________________   Event Date/Time   First MD Initiated Contact with Patient 10/31/20 1750     (approximate)  I have reviewed the triage vital signs and the nursing notes.   HISTORY  Chief Complaint Loss of Consciousness   HPI Kevin Terrell is a 80 y.o. male with a past medical history of arthritis, GERD, hypothyroidism and lung cancer status post right upper lobe lobectomy on radiation currently in remission as well as HDL who presents via EMS from [facility] for assessment after a syncopal episode associate with abdominal pain.  Patient states he was doing tai chi and started to feel lightheaded and see some spots and then developed some pain in his lower abdomen.  States he thinks he is dehydrated has not had much to eat or drink today.  States his pain is currently 9/10 intensity.  It seems to be fairly symmetric on both sides lower abdomen and does not radiate.  He denies any headache, earache, sore throat, chest pain, cough, shortness of breath or vomiting.  He does endorse a little nausea associated with lightheadedness earlier today.  He states when he was feeling lightheaded he was able to lower himself to a chair and thinks he likely passed out.  He denies any other acute sick symptoms including rash, urinary symptoms although he thinks he may have been a little bit constipated as he felt like he did not finish pooping today before leaving to go to class because class was about to start.  He denies any recent EtOH use illicit drug use or tobacco abuse.  No prior similar episodes.         Past Medical History:  Diagnosis Date  . Actinic keratosis   . Arthritis   . 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 GERD (gastroesophageal reflux disease)    with atypical cough  . Hypothyroidism   . Lung cancer ([place]) 2009   chemo, NO radiation. treated in [place]  .  Seborrheic dermatitis     Patient Active Problem List   Diagnosis Date Noted  . Syncope 10/31/2020  . 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 Candidal balanitis 04/27/2020  . Aortic atherosclerosis ([place]) 02/02/2020  . Encounter for screening colonoscopy   . HLD (hyperlipidemia) 10/28/2019  . Screening for cardiovascular, respiratory, and genitourinary diseases 10/28/2019  . Vitamin D deficiency 08/24/2019  . Encounter to establish care 08/24/2019  . Osteoarthritis 01/26/2019  . Hx of cancer of lung 08/22/2018  . 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 Seasonal allergies 05/18/2018  . Hypothyroidism 05/18/2018  . Gastroesophageal reflux disease without esophagitis 05/18/2018  . 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 Chronic midline low back pain 05/18/2018  . Low serum vitamin B12 10/07/2016  . Pain in left knee 10/07/2016  . 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 Bone pain 07/07/2016    Past Surgical History:  Procedure Laterality Date  . COLONOSCOPY    . COLONOSCOPY WITH PROPOFOL N/A 11/30/2019   Procedure: COLONOSCOPY WITH PROPOFOL;  Surgeon: Lin Landsman, MD;  Location: [facility] ENDOSCOPY;  Service: Gastroenterology;  Laterality: N/A;  . EYE SURGERY     vitreous detachment---laser  . KNEE CARTILAGE SURGERY Right 1968  . KNEE SURGERY     bilateral meniscal surgeries  . LUNG LOBECTOMY     upper right lung 12/15/2007   . 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  UPPER GI ENDOSCOPY      Prior to Admission medications   Medication Sig Start Date End Date Taking? Authorizing Provider  cetirizine (ZYRTEC) 10 MG tablet Take 10 mg by mouth daily.    [provider]  cholecalciferol (VITAMIN D3) 25 MCG (1000 UT) tablet Take 1,000 Units by mouth daily.    [provider]  famotidine (PEPCID) 20 MG tablet Take 1 tablet (20 mg total) by mouth at bedtime. 06/01/20   [name], MD  gabapentin (NEURONTIN) 300 MG capsule TAKE  1 CAPSULE BY MOUTH TWICE DAILY 02/13/20   Burnard Hawthorne, FNP  ketoconazole (NIZORAL) 2 % cream Apply  a small amount to affected area twice a day 08/22/19   [provider]  levothyroxine (SYNTHROID) 50 MCG tablet TAKE 1 TABLET BY MOUTH ONCE A DAY BEFOREBREAKFAST. 10/30/20   [name], MD  meloxicam (MOBIC) 15 MG tablet TAKE ONE TABLET BY MOUTH DAILY 07/09/20   Burnard Hawthorne, FNP  montelukast (SINGULAIR) 10 MG tablet TAKE 1 TABLET BY MOUTH EVERY NIGHT AT BEDTIME 04/03/20   Burnard Hawthorne, FNP  Multiple Vitamin (MULTIVITAMIN WITH MINERALS) TABS tablet Take 1 tablet by mouth daily.    [provider]  nystatin-triamcinolone ointment (MYCOLOG) Apply 1 application topically 2 (two) times daily. 06/28/20   Billey Co, MD  Olopatadine HCl 0.2 % SOLN as needed.     [provider]  rosuvastatin (CRESTOR) 10 MG tablet Take 1 tablet (10 mg total) by mouth daily. 10/28/19   Burnard Hawthorne, FNP  tacrolimus (PROTOPIC) 0.1 % ointment Apply topically 2 (two) times daily. 07/04/20   Moye, [place], MD  tacrolimus (PROTOPIC) 0.1 % ointment Apply topically in the morning and at bedtime. 07/24/20   Moye, [place], MD  triamcinolone ointment (KENALOG) 0.1 % APPLY TO HANDS AT BEDTIME AND COVER WITHGLOVES AS NEEDED FOR FLARES. AVOID FACE/GROIN/AXILLA. 06/11/20   Moye, [place], MD    Allergies Compazine [prochlorperazine edisylate] and Other  Family History  Problem Relation Age of Onset  . Cancer Mother   . Alzheimer's disease Mother   . Stroke Maternal Grandmother   . Hypertension Father   . Alzheimer's disease Father     Social History Social History   Tobacco Use  . Smoking status: Never Smoker  . Smokeless tobacco: Never Used  Vaping Use  . Vaping Use: Never used  Substance Use Topics  . Alcohol use: Yes    Comment: Once glass of wine per month, none last 24hrs  . Drug use: Never    Review of Systems  Review of Systems  Constitutional:  Negative for chills and fever.  HENT: Negative for sore throat.   Eyes: Negative for pain.  Respiratory: Negative for cough and stridor.   Cardiovascular: Negative for chest pain.  Gastrointestinal: Positive for abdominal pain and nausea. Negative for vomiting.  Genitourinary: Negative for dysuria.  Musculoskeletal: Negative for myalgias.  Skin: Negative for rash.  Neurological: Positive for dizziness and loss of consciousness. Negative for seizures and headaches.  Psychiatric/Behavioral: Negative for suicidal ideas.  All other systems reviewed and are negative.     ____________________________________________   PHYSICAL EXAM:  VITAL SIGNS: ED Triage Vitals  Enc Vitals Group     BP 10/31/20 1740 135/76     Pulse Rate 10/31/20 1740 65     Resp 10/31/20 1740 19     Temp 10/31/20 1740 97.8 F (36.6 C)     Temp Source 10/31/20 1740 Oral  SpO2 10/31/20 1740 98 %     Weight 10/31/20 1744 180 lb (81.6 kg)     Height 10/31/20 1744 6' 1"  (1.854 m)     Head Circumference --      Peak Flow --      Pain Score 10/31/20 1744 9     Pain Loc --      Pain Edu? --      Excl. in [place]? --    Vitals:   10/31/20 2145 10/31/20 2200  BP: (!) 113/56 105/65  Pulse: (!) 53 (!) 55  Resp: 16 16  Temp:    SpO2: 100% 100%   Physical Exam Vitals and nursing note reviewed. Exam conducted with a chaperone present.  Constitutional:      Appearance: He is well-developed and well-nourished.  HENT:     Head: Normocephalic and atraumatic.     Right Ear: External ear normal.     Left Ear: External ear normal.     Nose: Nose normal.     Mouth/Throat:     Mouth: Mucous membranes are dry.  Eyes:     Conjunctiva/sclera: Conjunctivae normal.  Cardiovascular:     Rate and Rhythm: Normal rate and regular rhythm.     Heart sounds: No murmur heard.   Pulmonary:     Effort: Pulmonary effort is normal. No respiratory distress.     Breath sounds: Normal breath sounds.  Abdominal:     Palpations:  Abdomen is soft.     Tenderness: There is abdominal tenderness in the right lower quadrant, epigastric area, periumbilical area, suprapubic area and left lower quadrant. There is no right CVA tenderness or left CVA tenderness.     Hernia: There is no hernia in the left inguinal area or right inguinal area.  Genitourinary:    Testes:        Right: Mass, tenderness or swelling not present.        Left: Mass, tenderness or swelling not present.     Epididymis:     Right: Not inflamed or enlarged. No mass.     Left: Not inflamed or enlarged. No mass.  Musculoskeletal:        General: No edema.     Cervical back: Neck supple.  Skin:    General: Skin is warm and dry.     Capillary Refill: Capillary refill takes 2 to 3 seconds.  Neurological:     Mental Status: He is alert and oriented to person, place, and time.  Psychiatric:        Mood and Affect: Mood and affect and mood normal.     PERRLA.  EOMI.  Symmetric bilateral upper extremity strength.  Sensation intact light touch all extremities.  2+ bilateral radial pulse. ____________________________________________   LABS (all labs ordered are listed, but only abnormal results are displayed)  Labs Reviewed  CBC WITH DIFFERENTIAL/PLATELET - Abnormal; Notable for the following components:      Result Value   WBC 10.6 (*)    Platelets 146 (*)    Neutro Abs 8.6 (*)    All other components within normal limits  COMPREHENSIVE METABOLIC PANEL - Abnormal; Notable for the following components:   Glucose, Bld 118 (*)    Total Bilirubin 1.3 (*)    All other components within normal limits  URINALYSIS, COMPLETE (UACMP) WITH MICROSCOPIC - Abnormal; Notable for the following components:   Color, Urine YELLOW (*)    APPearance CLEAR (*)    Specific Gravity, Urine >1.046 (*)  Ketones, ur 5 (*)    All other components within normal limits  LIPASE, BLOOD - Abnormal; Notable for the following components:   Lipase 2,209 (*)    All other  components within normal limits  SARS CORONAVIRUS 2 (TAT 6-24 HRS)  LIPID PANEL  TROPONIN I (HIGH SENSITIVITY)  TROPONIN I (HIGH SENSITIVITY)   ____________________________________________  EKG  Sinus rhythm with a ventricular rate of 59, normal axis, unremarkable intervals and no clear evidence of acute ischemia or other significant underlying arrhythmia. ____________________________________________  RADIOLOGY  ED MD interpretation: Chest x-ray has no focal consolidation, effusion, edema, pneumothorax or any other clear acute intrathoracic process.  CT abdomen pelvis is nonspecific opacities around the aorta and IVC without any other clear acute abdominal pelvic process.  Official radiology report(s): DG Chest 2 View  Result Date: 10/31/2020 CLINICAL DATA:  Syncope EXAM: CHEST - 2 VIEW COMPARISON:  None. FINDINGS: Heart size and mediastinal contours are within normal limits. Lungs are clear. No pleural effusion or pneumothorax is seen. No acute appearing osseous abnormality. IMPRESSION: No active cardiopulmonary disease. Electronically Signed   By: Franki Cabot M.D.   On: 10/31/2020 18:30   CT ABDOMEN PELVIS W CONTRAST  Result Date: 10/31/2020 CLINICAL DATA:  Abdominal pain. EXAM: CT ABDOMEN AND PELVIS WITH CONTRAST TECHNIQUE: Multidetector CT imaging of the abdomen and pelvis was performed using the standard protocol following bolus administration of intravenous contrast. CONTRAST:  182m OMNIPAQUE IOHEXOL 300 MG/ML  SOLN COMPARISON:  None. FINDINGS: Lower chest: No acute abnormality. Hepatobiliary: 2 cm hypodense lesion within the posterior-superior RIGHT liver lobe, indeterminate but most suggestive of a benign hemangioma. Gallbladder appears normal. No bile duct dilatation is seen. Pancreas: Pancreatic duct is dilated to approximately 6 mm. No obstructing mass or stone is identified within the pancreas. Spleen: Normal in size without focal abnormality. Adrenals/Urinary Tract: Adrenal  glands appear normal. Kidneys are unremarkable without mass, stone or hydronephrosis. No ureteral or bladder calculi are identified. Stomach/Bowel: No dilated large or small bowel loops are seen. No evidence of bowel wall inflammation. Appendix is normal. Vascular/Lymphatic: Aortic atherosclerosis. Reproductive: Prostate gland is mildly prominent in size. Other: There is an ill-defined/hazy density throughout the periaortic retroperitoneum with envelopment of both the aorta and portions of the inferior vena cava and with extension anteriorly about the superior mesenteric vessels (and proximal branches), and with scattered mildly prominent lymph nodes. This is new compared to earlier chest CT of 11/28/2019. Walls of the adjacent abdominal aorta are unremarkable, without obvious wall thickening or wall enhancement to suggest aortitis. Musculoskeletal: No acute appearing osseous abnormality. Lucency within the L5 vertebral body is likely related to overlying degenerative disc disease. IMPRESSION: 1. 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 This is new compared to earlier chest CT of 11/28/2019. Differential considerations include but are not limited to rapidly developing retroperitoneal fibrosis and neoplastic process such as lymphoma. Aortitis is considered much less likely due to the distribution and the lack of aortic wall thickening. Consider further characterization with PET-CT. Surgical exploration with biopsy may eventually be needed for definitive characterization. 2. Pancreatic duct is dilated to approximately 6 mm. No obstructing mass or stone is identified within the pancreas. No fluid about the pancreas itself to suggest acute pancreatitis. Recommend  further characterization with nonemergent pancreatic MRI. 3. 2 cm hypodense  lesion within the posterior-superior RIGHT liver lobe, indeterminate but most suggestive of a benign hemangioma. Consider confirmation with liver MRI. Aortic Atherosclerosis (ICD10-I70.0). Electronically Signed   By: Franki Cabot M.D.   On: 10/31/2020 19:15   US SCROTUM W/DOPPLER  Result Date: 10/31/2020 CLINICAL DATA:  Bilateral scrotal pain EXAM: SCROTAL ULTRASOUND DOPPLER ULTRASOUND OF THE TESTICLES TECHNIQUE: Complete ultrasound examination of the testicles, epididymis, and other scrotal structures was performed. Color and spectral Doppler ultrasound were also utilized to evaluate blood flow to the testicles. COMPARISON:  None. FINDINGS: Right testicle Measurements: 3.7 x 2.4 x 3.2 cm. No mass or microlithiasis visualized. Left testicle Measurements: 4.0 x 2.5 x 2.8 cm. No mass or microlithiasis visualized. Right epididymis: Normal in size. Slightly heterogeneous in appearance. Small scattered calcifications are seen. Left epididymis: Normal in size. Slightly heterogeneous in appearance. Hydrocele:  None visualized. Varicocele:  None visualized. Pulsed Doppler interrogation of both testes demonstrates normal low resistance arterial and venous waveforms bilaterally. IMPRESSION: Slightly heterogeneous bilateral epididymi, however no definite evidence of epididymitis. Normal appearing testicles. Electronically Signed   By: Prudencio Pair M.D.   On: 10/31/2020 23:07    ____________________________________________   PROCEDURES  Procedure(s) performed (including Critical Care):  Procedures   ____________________________________________   INITIAL IMPRESSION / ASSESSMENT AND PLAN / ED COURSE      Patient presents with above stage III exam for assessment of syncopal episode that occurred earlier today as described above associate with lower abdominal pain.  On arrival patient is afebrile and hemodynamically  stable.  Primary differential with constipation syncope include dehydration, vasovagal response to abdominal pain, metabolic derangements, arrhythmia, ACS, PE, and acute anemia.  No history or exam findings to suggest acute CVA, precipitating trauma or toxic ingestion.  With regard to his abdominal pain differential includes cholecystitis, AAA, kidney stone, pyelonephritis, cystitis, pancreatitis, diverticulitis, SBO, torsion hernia and torsion as he states it feels like it is radiating to his groin.  ECG shows no evidence of ischemia and given nonelevated troponin x2 low suspicion for ACS.  Chest x-ray has no evidence of pneumonia pneumothorax, effusion or edema or any other clear acute intrathoracic process.  No exam findings or chest x-ray findings to suggest acute heart failure.  No murmurs to suggest acute cardiomyopathy.  CBC has WBC count of 10.6 but no acute anemia.  CMP remarkable for T bili of 1.3 with no evidence of cholestasis, hepatitis or other significant electrolyte or metabolic derangements.  UA does not appear infected and patient has no CVA tenderness fever or elevated white blood cell count to suggest pyelonephritis.  Lipase is elevated at 2000 209 consistent with acute pancreatitis.  While CT does not have evidence of acute pancreatitis there is a dilated pancreatic duct although no dilations of the common bile duct, gallstones or other clear acute abdominal process.  There is evidence of a mass around the aorta and IVC concerning for possible fibrosis versus malignancy.   However I do not believe this is an acute finding more likely has developed over the last several weeks or months. Exam and ultrasound of the scrotum were not consistent with torsion or other acute scrotal process.  This is likely referred pain from patient's pancreatitis.  Lipid panel is unremarkable and likely not a factor contributing to his pancreatitis.  Overall unclear etiology for patient's pancreatitis although  I suspect he may have syncopized due to a combination of vasovagal response to his abdominal pain as well as some dehydration. ____________________________________________   FINAL CLINICAL  IMPRESSION(S) / ED DIAGNOSES  Final diagnoses:  Syncope and collapse  Dehydration  Acute pancreatitis, unspecified complication status, unspecified pancreatitis type    Medications  lactated ringers bolus 1,000 mL (0 mLs Intravenous Stopped 10/31/20 2200)  fentaNYL (SUBLIMAZE) injection 50 mcg (50 mcg Intravenous Given 10/31/20 1823)  iohexol (OMNIPAQUE) 300 MG/ML solution 100 mL (100 mLs Intravenous Contrast Given 10/31/20 1845)  alum & mag hydroxide-simeth (MAALOX/MYLANTA) 200-200-20 MG/5ML suspension 30 mL (30 mLs Oral Given 10/31/20 1959)  pantoprazole (PROTONIX) EC tablet 40 mg (40 mg Oral Given 10/31/20 1958)  HYDROcodone-acetaminophen (NORCO/VICODIN) 5-325 MG per tablet 1 tablet (1 tablet Oral Given 10/31/20 2030)  sodium phosphate (FLEET) 7-19 GM/118ML enema 1 enema (1 enema Rectal Given 10/31/20 2055)  ondansetron (ZOFRAN) injection 4 mg (4 mg Intravenous Given 10/31/20 2117)  sucralfate (CARAFATE) tablet 1 g (1 g Oral Given 10/31/20 2117)  HYDROmorphone (DILAUDID) injection 0.5 mg (0.5 mg Intravenous Given 10/31/20 2200)  lactated ringers bolus 1,000 mL (1,000 mLs Intravenous New Bag/Given 10/31/20 2255)     ED Discharge Orders    None       Note:  This document was prepared using Dragon voice recognition software and may include unintentional dictation errors.   Lucrezia Starch, MD 10/31/20 [phone]

## 2020-10-31 NOTE — ED Notes (Signed)
Assisted pt to the bathroom

## 2020-10-31 NOTE — ED Notes (Addendum)
Pt is at [facility] now trying to make a bowel movement

## 2020-10-31 NOTE — H&P (Signed)
History and Physical    PLEASE NOTE THAT DRAGON DICTATION SOFTWARE WAS USED IN THE CONSTRUCTION OF THIS NOTE.   Kevin Terrell SWH:[id number] DOB: 15-Jun-1941 DOA: 10/31/2020  PCP: [name], MD Patient coming from: home   I have personally briefly reviewed patient's old medical records in [place]  Chief Complaint: loss of consciousness  HPI: Kevin Terrell is a 80 y.o. male with medical history significant for lung cancer status post right upper lobectomy in April 2009 and status post chemotherapy reportedly now in remission, GERD, acquired hypothyroidism, osteoarthritis, who is admitted to [facility] on 10/31/2020 for further evaluation of syncope after presenting from home to [facility] ED complaining of episode of loss of consciousness.   The patient reports that he was performing tai chi earlier today when he developed dizziness and lightheadedness.  Sensation that he was going to lose consciousness, the patient reports that he was able to lower himself to the floor before subsequently losing consciousness.  This process did not involve any overt fall, the patient reports that he did not hit his head as a component of these mechanics.  He feels that he was unconscious for only few seconds, before regaining consciousness in the absence of any ensuing confusion, headache, acute focal weakness, acute focal numbness, paresthesias, dysarthria, facial droop, vertigo, or dysphagia.  Not associated with any tonic-clonic activity, tongue biting, or loss of bowel/bladder function. he reports a history of 1 prior syncopal event that occurred approximately 30 years ago while he lived in [place].  He believes that he was found to have positive orthostatic vital signs at that time.  He also denies any preceding or associated chest pain, shortness of breath, or palpitations.  Denies any ensuing presyncope or syncope.  He conveys diminished oral intake, particularly of fluids,  over the preceding 2 to 3 days and notes intermittent nausea in the absence of any vomiting over that time.    He reports 1 day of diffuse abdominal discomfort, which she describes as sharp in nature, most prominent over the epigastrium and radiating to the right upper abdominal quadrant.  Denies any preceding trauma.  Reports mild recent constipation in the absence of any associated melena or hematochezia.  He notes that this abdominal pain worsens in a postprandial timeframe. No recent travel, subjective fever, chills, rigors, or generalized myalgias.  He reports an intentional weight loss since January while on a diet with the rest of his family.  No recent diarrhea.  Denies any known prior history of acute pancreatitis.  Denies any recent dysuria, gross hematuria, or change in urinary urgency/frequency.  In the setting of osteoarthritis associate with the bilateral knees, the patient reports that he takes daily meloxicam in the absence of any additional oral NSAIDs.  Denies any routine alcohol consumption, oral corticosteroid medications, oral iron supplementation, or oral potassium supplementation.  No recent antibiotic use.   Acknowledges a history of GERD for which he is on Pepcid as an outpatient.  As further evaluation at the initial diagnosis of GERD, the patient reports that he underwent EGD approximately 10 to 12 years ago, with associated benign findings, per the patient, including no evidence of gastritis or peptic ulcer disease.  Additionally, he reports he underwent routine screening colonoscopy in March 2021, with removal of a few benign polyps that time, per the patient.   No recent headache, neck stiffness, rhinitis, rhinorrhea, sore throat, wheezing, cough, or rash.  No recent known COVID-19 exposures.  ED Course:  Vital signs in the ED were notable for the following: Tetramex 97.8, heart rate 53-65; blood pressure 113/56 - 135/75; respiratory rate 15-21, oxygen saturation 98 to  100% on room air.  Labs were notable for the following: CMP was notable for the following: BUN 16, creatinine 1.0, calcium 9.1, albumin 4.2, alkaline phosphatase 54, AST 26, ALT 11, total bilirubin 1.3.  Lipase found to be 2200, with no prior lipase data point available for point of comparison.  CBC notable for white blood cell count of 10,600, hemoglobin 13.9.  Urinalysis showed no white blood cells, no bacteria, nitrate negative, leukocyte Estrace negative, and elevated specific gravity greater than 1.046.  Nasopharyngeal COVID-19 screening was performed in the ED this evening, with result currently pending.  High-sensitivity troponin I x2 were found to be nonelevated on unchanged at 3.   Chest x-ray, 2 view showed no evidence of acute cardiopulmonary process.  CT abdomen/pelvis with contrast showed a normal-appearing gallbladder without gallbladder wall thickening, no evidence of common bile duct dilation or choledocholithiasis, and also showed dilation of the pancreatic duct to 6 mm without overt obstructing stone/mass and without overt pancreatic wall thickening or peripancreatic fluid noted, and also showed evidence of hazy densities throughout the retroperitoneum, new since CT chest in March 2021.  No evidence of bowel obstruction, abscess, or perforation, and no evidence of diverticulitis.  EKG showed sinus rhythm with heart rate 59, normal intervals, no evidence of T wave or ST changes, clear no evidence of ST elevation.  While in the ED, the following were administered: Maalox/Mylanta p.o. x1, fentanyl 50 mg IV x1, Norco 5/325 mg p.o. x1, Dilaudid 0.5 mg IV x1, Zofran 4 mg IV x1, Protonix 40 mg p.o. x1, Carafate x1 dose, and lactated Ringer x2 L bolus.     Review of Systems: As per HPI otherwise 10 point review of systems negative.   Past Medical History:  Diagnosis Date  . Actinic keratosis   . Arthritis   . 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 GERD (gastroesophageal reflux  disease)    with atypical cough  . Hypothyroidism   . Lung cancer ([place]) 2009   chemo, NO radiation. treated in [place]  . Seborrheic dermatitis     Past Surgical History:  Procedure Laterality Date  . COLONOSCOPY    . COLONOSCOPY WITH PROPOFOL N/A 11/30/2019   Procedure: COLONOSCOPY WITH PROPOFOL;  Surgeon: Lin Landsman, MD;  Location: [facility] ENDOSCOPY;  Service: Gastroenterology;  Laterality: N/A;  . EYE SURGERY     vitreous detachment---laser  . KNEE CARTILAGE SURGERY Right 1968  . KNEE SURGERY     bilateral meniscal surgeries  . LUNG LOBECTOMY     upper right lung 12/15/2007   . 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 UPPER GI ENDOSCOPY      Social History:  reports that he has never smoked. He has never used smokeless tobacco. He reports current alcohol use. He reports that he does not use drugs.   Allergies  Allergen Reactions  . Compazine [Prochlorperazine Edisylate]     muscle spasmus   . Other     Okra - hives    Family History  Problem Relation Age of Onset  . Cancer Mother   . Alzheimer's disease Mother   . Stroke Maternal Grandmother   . Hypertension Father   . Alzheimer's disease Father      Prior to Admission medications   Medication Sig Start Date  End Date Taking? Authorizing Provider  cetirizine (ZYRTEC) 10 MG tablet Take 10 mg by mouth daily.    [provider]  cholecalciferol (VITAMIN D3) 25 MCG (1000 UT) tablet Take 1,000 Units by mouth daily.    [provider]  famotidine (PEPCID) 20 MG tablet Take 1 tablet (20 mg total) by mouth at bedtime. 06/01/20   [name], MD  gabapentin (NEURONTIN) 300 MG capsule TAKE 1 CAPSULE BY MOUTH TWICE DAILY 02/13/20   Burnard Hawthorne, FNP  ketoconazole (NIZORAL) 2 % cream Apply  a small amount to affected area twice a day 08/22/19   [provider]  levothyroxine (SYNTHROID) 50 MCG tablet TAKE 1 TABLET BY MOUTH ONCE A DAY BEFOREBREAKFAST. 10/30/20   [name],  MD  meloxicam (MOBIC) 15 MG tablet TAKE ONE TABLET BY MOUTH DAILY 07/09/20   Burnard Hawthorne, FNP  montelukast (SINGULAIR) 10 MG tablet TAKE 1 TABLET BY MOUTH EVERY NIGHT AT BEDTIME 04/03/20   Burnard Hawthorne, FNP  Multiple Vitamin (MULTIVITAMIN WITH MINERALS) TABS tablet Take 1 tablet by mouth daily.    [provider]  nystatin-triamcinolone ointment (MYCOLOG) Apply 1 application topically 2 (two) times daily. 06/28/20   Billey Co, MD  Olopatadine HCl 0.2 % SOLN as needed.     [provider]  rosuvastatin (CRESTOR) 10 MG tablet Take 1 tablet (10 mg total) by mouth daily. 10/28/19   Burnard Hawthorne, FNP  tacrolimus (PROTOPIC) 0.1 % ointment Apply topically 2 (two) times daily. 07/04/20   Moye, [place], MD  tacrolimus (PROTOPIC) 0.1 % ointment Apply topically in the morning and at bedtime. 07/24/20   Moye, [place], MD  triamcinolone ointment (KENALOG) 0.1 % APPLY TO HANDS AT BEDTIME AND COVER WITHGLOVES AS NEEDED FOR FLARES. AVOID FACE/GROIN/AXILLA. 06/11/20   Moye, [place], MD     Objective    Physical Exam: Vitals:   10/31/20 1830 10/31/20 1900 10/31/20 2145 10/31/20 2200  BP:  137/78 (!) 113/56 105/65  Pulse: (!) 58 87 (!) 53 (!) 55  Resp: (!) 21 15 16 16   Temp:      TempSrc:      SpO2: 100% 98% 100% 100%  Weight:      Height:        General: appears to be stated age; alert, oriented Skin: warm, dry, no rash Head:  AT/ Mouth:  Oral mucosa membranes appear dry, normal dentition Neck: supple; trachea midline Heart:  RRR; did not appreciate any M/R/G Lungs: CTAB, did not appreciate any wheezes, rales, or rhonchi Abdomen: + BS; soft, ND, diffuse mild tenderness, most pronounced over the epigastrium and right upper quadrant in the absence of any associated guarding, rigidity, or rebound tenderness. Vascular: 2+ pedal pulses b/l; 2+ radial pulses b/l Extremities: no peripheral edema, no muscle wasting Neuro: strength and sensation intact in upper  and lower extremities b/l    Labs on Admission: I have personally reviewed following labs and imaging studies  CBC: Recent Labs  Lab 10/31/20 1757  WBC 10.6*  NEUTROABS 8.6*  HGB 13.9  HCT 42.4  MCV 92.8  PLT 592*   Basic Metabolic Panel: Recent Labs  Lab 10/31/20 1757  NA 135  K 5.1  CL 101  CO2 23  GLUCOSE 118*  BUN 16  CREATININE 1.00  CALCIUM 9.1   GFR: Estimated Creatinine Clearance: 68.8 mL/min (by C-G formula based on SCr of 1 mg/dL). Liver Function Tests: Recent Labs  Lab 10/31/20 1757  AST 26  ALT 11  ALKPHOS 54  BILITOT 1.3*  PROT 7.1  ALBUMIN 4.2   Recent Labs  Lab 10/31/20 2041  LIPASE 2,209*   No results for input(s): AMMONIA in the last 168 hours. Coagulation Profile: No results for input(s): INR, PROTIME in the last 168 hours. Cardiac Enzymes: No results for input(s): CKTOTAL, CKMB, CKMBINDEX, TROPONINI in the last 168 hours. BNP (last 3 results) No results for input(s): PROBNP in the last 8760 hours. HbA1C: No results for input(s): HGBA1C in the last 72 hours. CBG: No results for input(s): GLUCAP in the last 168 hours. Lipid Profile: No results for input(s): CHOL, HDL, LDLCALC, TRIG, CHOLHDL, LDLDIRECT in the last 72 hours. Thyroid Function Tests: No results for input(s): TSH, T4TOTAL, FREET4, T3FREE, THYROIDAB in the last 72 hours. Anemia Panel: No results for input(s): VITAMINB12, FOLATE, FERRITIN, TIBC, IRON, RETICCTPCT in the last 72 hours. Urine analysis:    Component Value Date/Time   COLORURINE YELLOW (A) 10/31/2020 1937   APPEARANCEUR CLEAR (A) 10/31/2020 1937   APPEARANCEUR Clear 06/07/2020 1411   LABSPEC >1.046 (H) 10/31/2020 1937   PHURINE 8.0 10/31/2020 1937   GLUCOSEU NEGATIVE 10/31/2020 1937   HGBUR NEGATIVE 10/31/2020 1937   BILIRUBINUR NEGATIVE 10/31/2020 1937   BILIRUBINUR Negative 06/07/2020 1411   KETONESUR 5 (A) 10/31/2020 1937   PROTEINUR NEGATIVE 10/31/2020 1937   NITRITE NEGATIVE 10/31/2020 1937    LEUKOCYTESUR NEGATIVE 10/31/2020 1937    Radiological Exams on Admission: DG Chest 2 View  Result Date: 10/31/2020 CLINICAL DATA:  Syncope EXAM: CHEST - 2 VIEW COMPARISON:  None. FINDINGS: Heart size and mediastinal contours are within normal limits. Lungs are clear. No pleural effusion or pneumothorax is seen. No acute appearing osseous abnormality. IMPRESSION: No active cardiopulmonary disease. Electronically Signed   By: Franki Cabot M.D.   On: 10/31/2020 18:30   CT ABDOMEN PELVIS W CONTRAST  Result Date: 10/31/2020 CLINICAL DATA:  Abdominal pain. EXAM: CT ABDOMEN AND PELVIS WITH CONTRAST TECHNIQUE: Multidetector CT imaging of the abdomen and pelvis was performed using the standard protocol following bolus administration of intravenous contrast. CONTRAST:  138m OMNIPAQUE IOHEXOL 300 MG/ML  SOLN COMPARISON:  None. FINDINGS: Lower chest: No acute abnormality. Hepatobiliary: 2 cm hypodense lesion within the posterior-superior RIGHT liver lobe, indeterminate but most suggestive of a benign hemangioma. Gallbladder appears normal. No bile duct dilatation is seen. Pancreas: Pancreatic duct is dilated to approximately 6 mm. No obstructing mass or stone is identified within the pancreas. Spleen: Normal in size without focal abnormality. Adrenals/Urinary Tract: Adrenal glands appear normal. Kidneys are unremarkable without mass, stone or hydronephrosis. No ureteral or bladder calculi are identified. Stomach/Bowel: No dilated large or small bowel loops are seen. No evidence of bowel wall inflammation. Appendix is normal. Vascular/Lymphatic: Aortic atherosclerosis. Reproductive: Prostate gland is mildly prominent in size. Other: There is an ill-defined/hazy density throughout the periaortic retroperitoneum with envelopment of both the aorta and portions of the inferior vena cava and with extension anteriorly about the superior mesenteric vessels (and proximal branches), and with scattered mildly prominent lymph  nodes. This is new compared to earlier chest CT of 11/28/2019. Walls of the adjacent abdominal aorta are unremarkable, without obvious wall thickening or wall enhancement to suggest aortitis. Musculoskeletal: No acute appearing osseous abnormality. Lucency within the L5 vertebral body is likely related to overlying degenerative disc disease. IMPRESSION: 1. Extensive ill-defined/hazy density throughout the periaortic retroperitoneum, with envelopment of both the aorta and portions of the inferior vena cava, and with extension anteriorly about the  superior mesenteric vessels (and proximal branches), and containing scattered mildly prominent lymph nodes. This is new compared to earlier chest CT of 11/28/2019. Differential considerations include but are not limited to rapidly developing retroperitoneal fibrosis and neoplastic process such as lymphoma. Aortitis is considered much less likely due to the distribution and the lack of aortic wall thickening. Consider further characterization with PET-CT. Surgical exploration with biopsy may eventually be needed for definitive characterization. 2. Pancreatic duct is dilated to approximately 6 mm. No obstructing mass or stone is identified within the pancreas. No fluid about the pancreas itself to suggest acute pancreatitis. Recommend further characterization with nonemergent pancreatic MRI. 3. 2 cm hypodense lesion within the posterior-superior RIGHT liver lobe, indeterminate but most suggestive of a benign hemangioma. Consider confirmation with liver MRI. Aortic Atherosclerosis (ICD10-I70.0). Electronically Signed   By: Franki Cabot M.D.   On: 10/31/2020 19:15     EKG: Independently reviewed, with result as described above.    Assessment/Plan   Kevin Terrell is a 80 y.o. male with medical history significant for lung cancer status post right upper lobectomy in April 2009 and status post chemotherapy reportedly now in remission, GERD, acquired hypothyroidism,  osteoarthritis, who is admitted to [facility] on 10/31/2020 for further evaluation of syncope after presenting from home to [facility] ED complaining of episode of loss of consciousness.    Principal Problem:   Syncope Active Problems:   Hypothyroidism   Gastroesophageal reflux disease without esophagitis   Abdominal pain   Dilation of pancreatic duct   Dehydration    #) Syncope: 1 episode of syncope that occurred earlier today and associated with prodrome.  Potentially multifactorial, with possible contributions from orthostatic hypotension in the setting of dehydration given the patient's report of recent decrease in oral intake, particularly that of fluids over the preceding 2 to 3 days with further substantiation of suspected dehydration in the setting of urinalysis that was associated with an elevated specific gravity versus contribution from vasovagal implications in response to 1 day of progressive abdominal discomfort.  Not associate with any acute focal neurologic deficits.  Clinically, acute ischemic stroke versus seizure appear less likely at this time. Presentation appears less consistent with ACS, with serial troponin found to be nonelevated, while presenting EKG shows no evidence of acute ischemic changes, on the absence of any recent chest discomfort.  Will check orthostatic vital signs, but with the caveat that the patient has already received IVF's in the ED, potentially altering the results of this evaluation.   Of note, the patient did not hit his head as a component of a syncopal episode.    Plan: I have placed a nursing communication order requesting that orthostatic vital signs x 1 set be checked and documented, following which will continue IV fluid resuscitative efforts with lactated Ringer's at 125 cc/h.  Monitor on telemetry.  Monitor strict I's and O's and daily weights.  Check CMP and CBC in the morning.  Add on serum magnesium level.  Further work-up and  management of abdominal discomfort, as further described below.  Repeat high-sensitivity troponin I with the morning labs, which were present the third such value.      #) Dilation of pancreatic duct: In the context of 1 day of progressive diffuse abdominal pain, most notably over the epigastrium, exacerbated with eating, a/w elevated lipase, CT abdomen/pelvis with contrast shows dilation of the pancreatic duct to 6 mm without overt obstructing stone/mass and without overt pancreatic wall thickening or peripancreatic fluid  noted, while demonstrating no evidence of acute cholecystitis or choledocholithiasis.  Unclear pathology leading to patient's abdominal discomfort and radiographic findings, CT imaging did not show overt evidence to suggest acute pancreatitis, although the lipase was noted to be elevated. Per radiology recommendation, MRI of the pancreas could be pursued to further evaluate this nonspecific dilation of the pancreatic duct.  In the meantime we will treat conservatively with IV fluids, as needed IV analgesia, and allow for bowel rest. Can also consider obtaining right upper quadrant ultrasound depending upon interval clinical trend as well as results of repeat CMP ordered for the morning.  Of note, the patient denies any history of regular or recent alcohol consumption, and triglycerides were found to be nonelevated at presentation.  Should patient subsequently be determined to have acute pancreatitis, differential would also include peptic ulcer disease in the setting of daily NSAID use as well as postprandial exacerbation of his presenting abdominal pain.  Plan: NPO.  IV fluids, as above.  As needed IV Dilaudid.  As needed IV Zofran.  Repeat CMP in the morning.  Check GGT.  Pancreatic MRI, as above.  Check ionized calcium.     #) Dehydration: Diagnosis on the basis of physical exam evidence that includes dry oral mucous membrane, in addition to suggestion of dehydration stemming from  urinalysis result, included elevated specific gravity of 1.046.  This appears to be on the basis of recent decline in oral intake of water over the last 2 to 3 days, as further noted above.  No evidence of associated hypotension.  Plan: Continue IV fluids, as above.  Monitor strict I's and O's and daily weights.  Recommend management of presenting abdominal discomfort, as above.  Repeat CMP in the morning.      #) GERD: Patient confirms a history of such, and acknowledges good compliance with his home daily Pepcid.  He reports that he previously underwent EGD approximately 10 to 12 years ago which showed no evidence of acute process, including no evidence of mass, esophagitis, gastritis, or peptic ulcer disease.  Plan: In the setting of current n.p.o. status, will hold home Pepcid for now.  Protonix 40 mg IV daily.       #) Acquired hypothyroidism: On Synthroid as an outpatient.  Plan: In the setting of current n.p.o. status, will hold home Synthroid for now.      #) Osteoarthritis of the bilateral knees: On daily meloxicam in the absence of any additional NSAIDs.  Plan: In the setting of current n.p.o. status as well as presenting abdominal discomfort, will hold home meloxicam for now.     DVT prophylaxis: scd's  Code Status: Full code Family Communication: Case was discussed with the patient's daughter, who was present at bedside. Disposition Plan: Per Rounding Team Consults called: none  Admission status: Inpatient; med telemetry.   Of note, this patient was added by me to the following Admit List/Treatment Team: armcadmits.      PLEASE NOTE THAT DRAGON DICTATION SOFTWARE WAS USED IN THE CONSTRUCTION OF THIS NOTE.   [facility] Pager [phone] From 12PM - 12AM  Otherwise, please contact night-coverage  [email or web address] Password [facility]   10/31/2020, 11:03 PM

## 2020-10-31 NOTE — ED Triage Notes (Signed)
Pt to ED via [facility] from [facility]. Per EMS, Pt was participating in [place] arts at the facility. Pt felt lower abd pain and got light headed. Pt started "seeing dots," and passed out. Pt stated that he was also sweating. Pt did not hit his head. Pt is not on blood thinners. EMS vitals BP128/63, HR 56, O2 98% on RA.

## 2020-11-01 ENCOUNTER — Encounter: Payer: Self-pay | Admitting: Internal Medicine

## 2020-11-01 ENCOUNTER — Inpatient Hospital Stay: Payer: Medicare Other

## 2020-11-01 DIAGNOSIS — K8689 Other specified diseases of pancreas: Secondary | ICD-10-CM | POA: Diagnosis present

## 2020-11-01 DIAGNOSIS — R109 Unspecified abdominal pain: Secondary | ICD-10-CM | POA: Diagnosis present

## 2020-11-01 DIAGNOSIS — E860 Dehydration: Secondary | ICD-10-CM | POA: Diagnosis present

## 2020-11-01 DIAGNOSIS — K859 Acute pancreatitis without necrosis or infection, unspecified: Secondary | ICD-10-CM

## 2020-11-01 LAB — COMPREHENSIVE METABOLIC PANEL
ALT: 12 U/L (ref 0–44)
AST: 24 U/L (ref 15–41)
Albumin: 3.5 g/dL (ref 3.5–5.0)
Alkaline Phosphatase: 48 U/L (ref 38–126)
Anion gap: 10 (ref 5–15)
BUN: 19 mg/dL (ref 8–23)
CO2: 24 mmol/L (ref 22–32)
Calcium: 8 mg/dL — ABNORMAL LOW (ref 8.9–10.3)
Chloride: 99 mmol/L (ref 98–111)
Creatinine, Ser: 0.77 mg/dL (ref 0.61–1.24)
GFR, Estimated: >60 mL/min (ref >60)
Glucose, Bld: 139 mg/dL — ABNORMAL HIGH (ref 70–99)
Potassium: 4.2 mmol/L (ref 3.5–5.1)
Sodium: 133 mmol/L — ABNORMAL LOW (ref 135–145)
Total Bilirubin: 0.9 mg/dL (ref 0.3–1.2)
Total Protein: 6 g/dL — ABNORMAL LOW (ref 6.5–8.1)

## 2020-11-01 LAB — CBC WITH DIFFERENTIAL/PLATELET
Abs Immature Granulocytes: 0.07 10*3/uL (ref 0.00–0.07)
Basophils Absolute: 0 10*3/uL (ref 0.0–0.1)
Basophils Relative: 0 %
Eosinophils Absolute: 0 10*3/uL (ref 0.0–0.5)
Eosinophils Relative: 0 %
HCT: 40.8 % (ref 39.0–52.0)
Hemoglobin: 13.6 g/dL (ref 13.0–17.0)
Immature Granulocytes: 1 %
Lymphocytes Relative: 4 %
Lymphs Abs: 0.6 10*3/uL — ABNORMAL LOW (ref 0.7–4.0)
MCH: 30.5 pg (ref 26.0–34.0)
MCHC: 33.3 g/dL (ref 30.0–36.0)
MCV: 91.5 fL (ref 80.0–100.0)
Monocytes Absolute: 1 10*3/uL (ref 0.1–1.0)
Monocytes Relative: 7 %
Neutro Abs: 12.9 10*3/uL — ABNORMAL HIGH (ref 1.7–7.7)
Neutrophils Relative %: 88 %
Platelets: 156 10*3/uL (ref 150–400)
RBC: 4.46 MIL/uL (ref 4.22–5.81)
RDW: 12.2 % (ref 11.5–15.5)
WBC: 14.5 10*3/uL — ABNORMAL HIGH (ref 4.0–10.5)
nRBC: 0.1 % (ref 0.0–0.2)

## 2020-11-01 LAB — PROTIME-INR
INR: 1.3 — ABNORMAL HIGH (ref 0.8–1.2)
Prothrombin Time: 15.4 seconds — ABNORMAL HIGH (ref 11.4–15.2)

## 2020-11-01 LAB — PHOSPHORUS: Phosphorus: 3.8 mg/dL (ref 2.5–4.6)

## 2020-11-01 LAB — MAGNESIUM
Magnesium: 2.1 mg/dL (ref 1.7–2.4)
Magnesium: 2 mg/dL (ref 1.7–2.4)

## 2020-11-01 LAB — SARS CORONAVIRUS 2 (TAT 6-24 HRS): SARS Coronavirus 2: NEGATIVE

## 2020-11-01 LAB — GAMMA GT: GGT: <5 U/L — ABNORMAL LOW (ref 7–50)

## 2020-11-01 LAB — TROPONIN I (HIGH SENSITIVITY): Troponin I (High Sensitivity): <2 ng/L (ref <18)

## 2020-11-01 IMAGING — MR MR ABDOMEN WO/W CM MRCP
20 of 23 series · 43 of 48 positions shown · IV contrast (gadavist)
Comparison: CT scan [DATE].

CLINICAL DATA: Pancreatic ductal dilatation.

EXAM:
MRI ABDOMEN WITHOUT AND WITH CONTRAST (INCLUDING MRCP)
TECHNIQUE: Multiplanar multisequence MR imaging of the abdomen was performed
both before and after the administration of intravenous contrast.
Heavily T2-weighted images of the biliary and pancreatic ducts were
obtained, and three-dimensional MRCP images were rendered by post
processing.
CONTRAST:  8mL GADAVIST GADOBUTROL 1 MMOL/ML IV SOLN

[Series 3: T2 · coronal · 6.0mm · 1.19mm/px · 1 of 33 slices shown (1 of 2)]
[im 1/33]
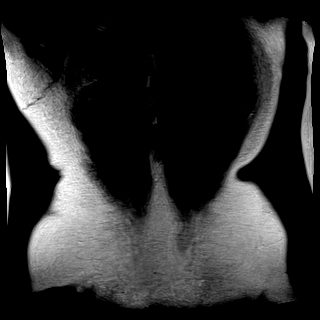

[Series 4: T2 · axial · 6.0mm · 1.19mm/px · 1 of 36 slices shown (2 of 2)]
[im 1/36]
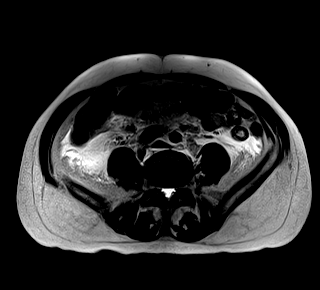

[Series 5: T1 · axial · 6.0mm · 0.74mm/px · 1 of 36 slices shown (1 of 2)]
[im 1/36]
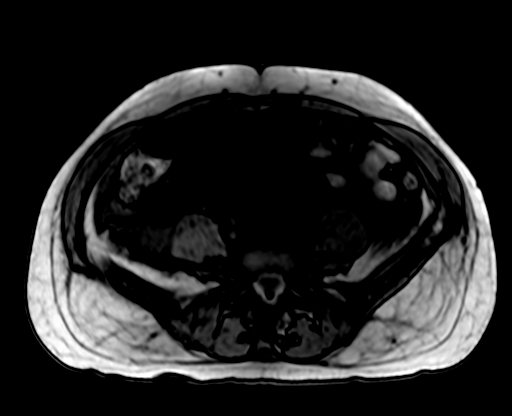

[Series 5: T1 · axial · 6.0mm · 0.74mm/px · 1 of 36 slices shown (2 of 2)]
[im 1/36]
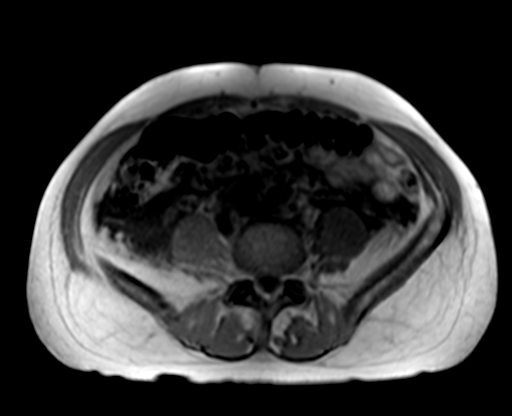

[Series 8: T2 fat-sat · axial · 6.0mm · 1.19mm/px · 1 of 34 slices shown]
[im 1/34]
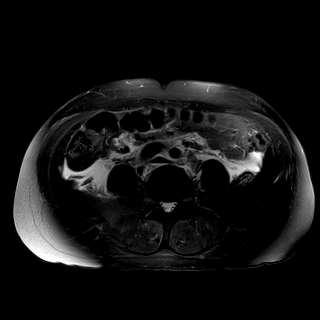

[Series 9: ax dwi_tracew · axial · 6.0mm · 1.42mm/px · z∈[-102,+136]mm · 4 of 102 slices shown]
[im 1/102]
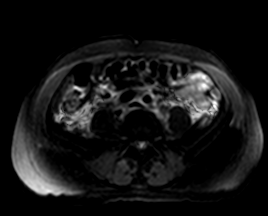
[im 34/102]
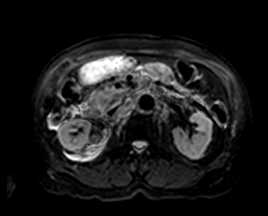
[im 68/102]
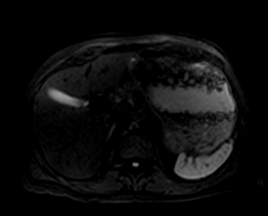
[im 102/102]
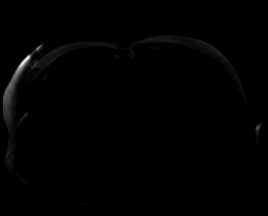

[Series 10: ax dwi_adc · axial · 6.0mm · 1.42mm/px · 1 of 34 slices shown]
[im 1/34]
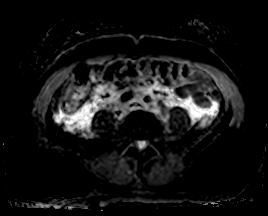

[Series 14: MRCP · coronal · 3.0mm · 1.12mm/px · 1 of 19 slices shown (1 of 2)]
[im 1/19]
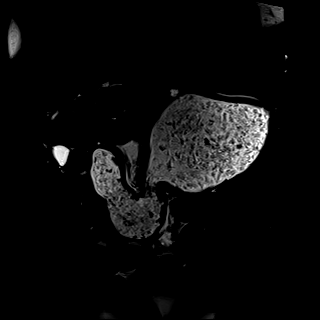

[Series 15: radials · coronal · 50.0mm · 0.78mm/px · 1 of 5 slices shown]
[im 1/5]
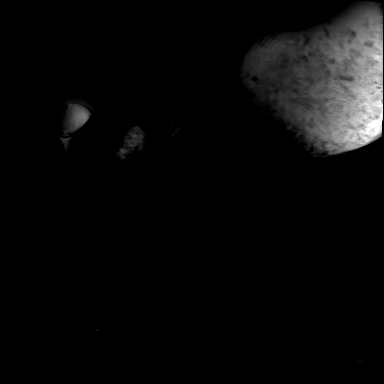

[Series 16: T1 dynamic fat-sat · axial · non-contrast · 3.0mm · 1.19mm/px · z∈[-117,+144]mm · 3 of 88 slices shown (1 of 5)]
[im 1/88]
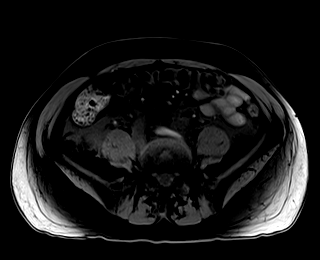
[im 44/88]
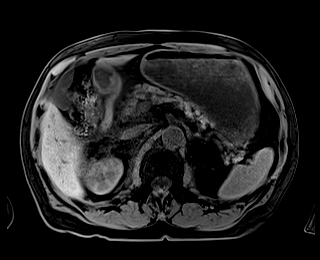
[im 88/88]
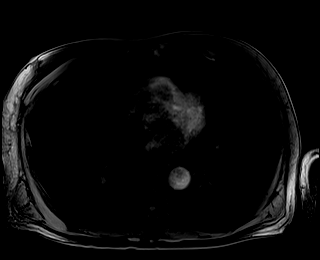

[Series 17: T1 dynamic fat-sat post-contrast · axial · 3.0mm · 1.19mm/px · z∈[-117,+144]mm · 3 of 88 slices shown (1 of 4)]
[im 1/88]
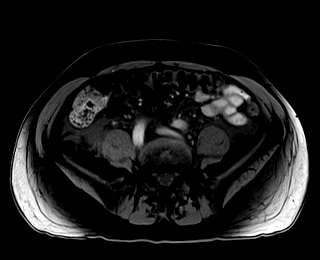
[im 44/88]
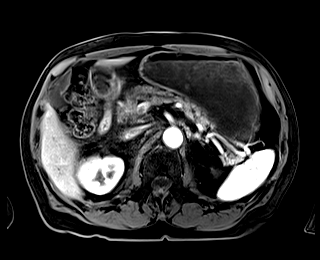
[im 88/88]
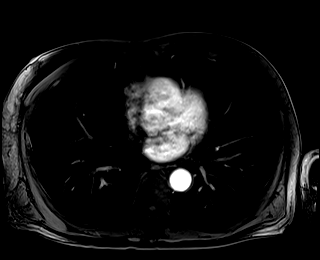

[Series 18: T1 dynamic fat-sat · axial · 3.0mm · 1.19mm/px · z∈[-117,+144]mm · 3 of 88 slices shown (2 of 5)]
[im 1/88]
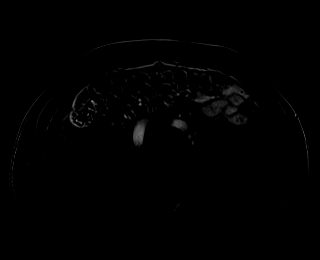
[im 44/88]
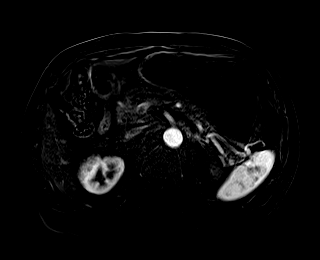
[im 88/88]
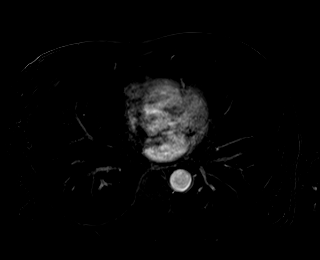

[Series 19: T1 dynamic fat-sat post-contrast · axial · 3.0mm · 1.19mm/px · z∈[-117,+144]mm · 3 of 88 slices shown (2 of 4)]
[im 1/88]
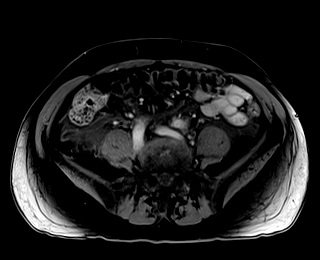
[im 44/88]
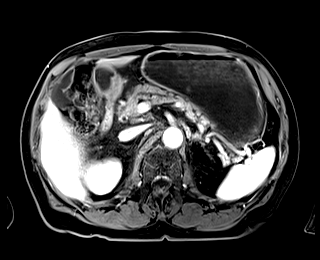
[im 88/88]
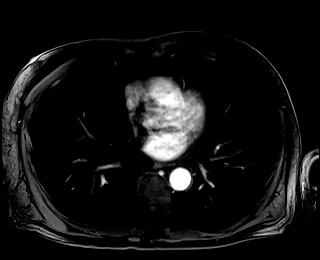

[Series 20: T1 dynamic fat-sat · axial · 3.0mm · 1.19mm/px · z∈[-117,+144]mm · 3 of 88 slices shown (3 of 5)]
[im 1/88]
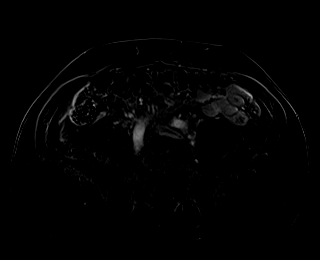
[im 44/88]
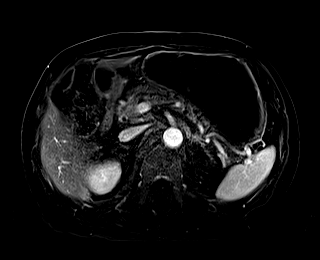
[im 88/88]
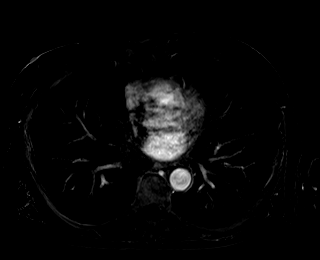

[Series 21: T1 dynamic fat-sat post-contrast · axial · 3.0mm · 1.19mm/px · z∈[-117,+144]mm · 3 of 88 slices shown (3 of 4)]
[im 1/88]
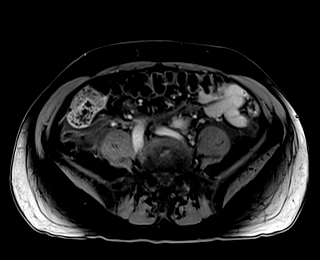
[im 44/88]
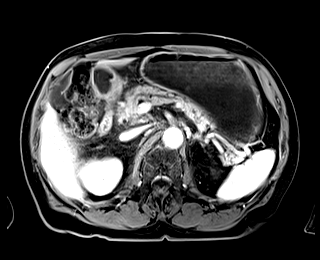
[im 88/88]
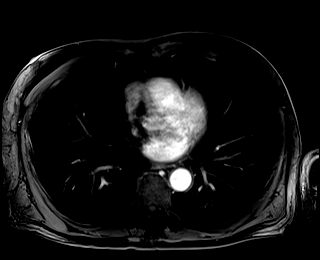

[Series 22: T1 dynamic fat-sat · axial · 3.0mm · 1.19mm/px · z∈[-117,+144]mm · 3 of 88 slices shown (4 of 5)]
[im 1/88]
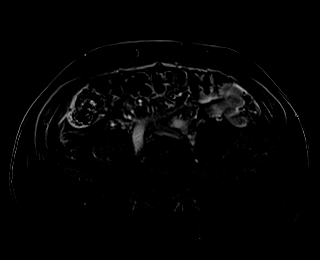
[im 44/88]
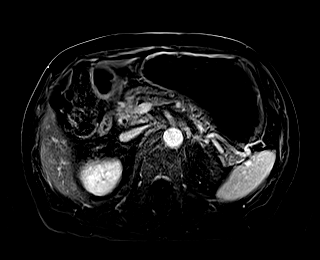
[im 88/88]
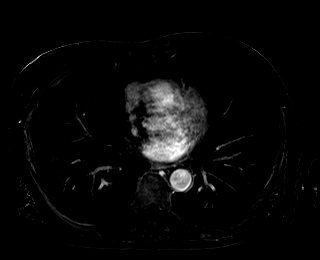

[Series 23: T1 dynamic post-contrast · coronal · 3.0mm · 1.31mm/px · 3 of 80 slices shown]
[im 1/80]
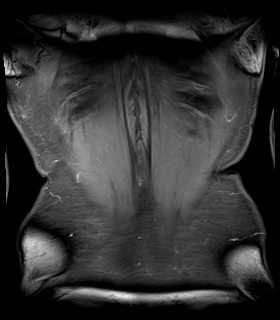
[im 40/80]
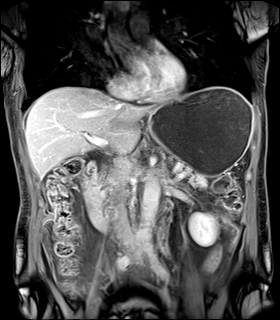
[im 80/80]
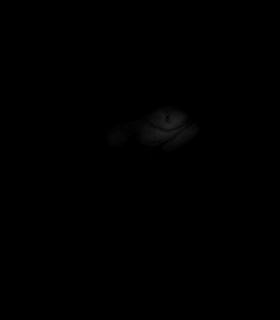

[Series 24: T1 dynamic fat-sat post-contrast · axial · 3.0mm · 1.19mm/px · z∈[-117,+144]mm · 3 of 88 slices shown (4 of 4)]
[im 1/88]
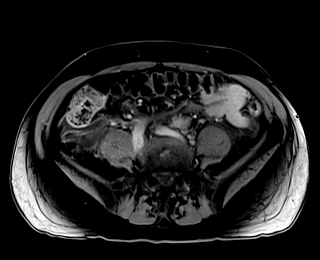
[im 44/88]
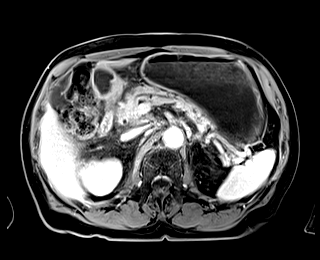
[im 88/88]
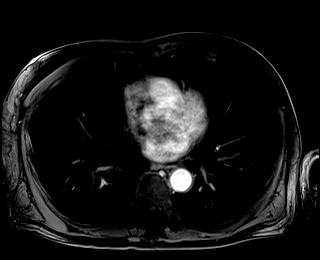

[Series 25: T1 dynamic fat-sat · axial · 3.0mm · 1.19mm/px · z∈[-117,+144]mm · 3 of 88 slices shown (5 of 5)]
[im 1/88]
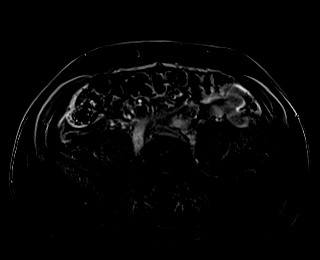
[im 44/88]
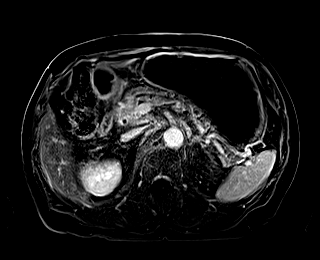
[im 88/88]
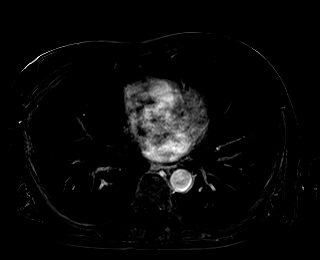

[Series 1021: MRCP · sagittal · 0.5mm · 0.49mm/px · 1 of 13 slices shown (2 of 2)]
[im 1/13]
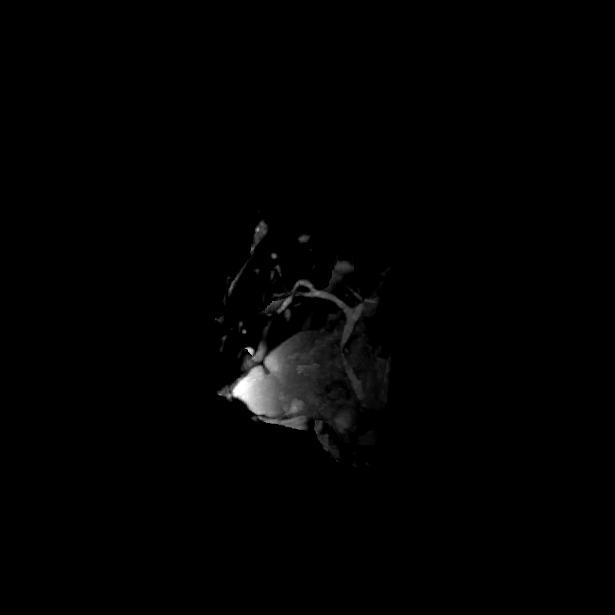

[43 of 48 positions shown; findings below may reference images not displayed]

FINDINGS: Lower chest: Unremarkable.

Hepatobiliary: 2.3 cm lesion noted posterior right liver showing
some peripheral nodular enhancement after IV contrast
administration. No substantial filling in on more delayed imaging.
This lesion was present on a chest CT from [DATE] and is stable
in size in the interval. Imaging features likely reflect cavernous
hemangioma. 1.4 cm simple cyst noted in the dome of the left liver
several other scattered tiny T2 hyperintensities are too small to
characterize. There is no evidence for gallstones, gallbladder wall
thickening, or pericholecystic fluid.

Mild intrahepatic biliary duct dilatation noted with focal narrowing
of the common duct in the hepatoduodenal ligament (see coronal thin
MRCP image 12 of series 14 and postcontrast coronal image 43 of
series 23. This finding is also demonstrated on 3D MRCP image 3 of
series [DATE] and image 1 of series [NV]. No discernible obstructing
stone or mass lesion evident.

Heterogeneous perfusion of liver parenchyma evident on arterial
phase imaging.

Pancreas: Diffuse edema in and around the pancreas appears
progressive since CT scan 1 day earlier. Pancreatic duct is less
dilated measuring 3 mm diameter in the body and 4 mm diameter in the
head of pancreas. No discrete pancreatic mass lesion evident.

Spleen:  No splenomegaly. No focal mass lesion.

Adrenals/Urinary Tract: No adrenal nodule or mass. No gross
suspicious abnormality in either kidney.

Stomach/Bowel: Stomach is markedly distended with food and fluid.
Duodenum is nondistended with duodenal diverticulum evident. No
small bowel or colonic dilatation within the visualized abdomen.

Vascular/Lymphatic: No abdominal aortic aneurysm no substantial wall
thickening or mural enhancement in the aorta. Variant anatomy with
common trunk for the celiac axis and SMA noted.

Other: As seen on previous CT, there is diffuse retroperitoneal
edema, progressive in the interval with fluid now visible in the
para colic gutters bilaterally

Musculoskeletal: No focal suspicious marrow enhancement within the
visualized bony anatomy.
IMPRESSION: 1. Mild intrahepatic biliary duct dilatation with focal narrowing of
the common duct in the hepatoduodenal ligament. No discernible
obstructing stone or mass lesion evident. ERCP may be warranted to
further evaluate.
2. No gallstones or evidence of choledocholithiasis.
3. Diffuse edema in and around the pancreas appears progressive
since CT scan 1 day earlier. Pancreatic duct is less dilated. No
discrete mass lesion evident. Imaging appearance suggests
pancreatitis.
4. Interval progression of retroperitoneal fluid with fluid now
visible in the para colic gutters. As on CT yesterday, there is
retroperitoneal edema around the aorta but no substantial aortic
wall thickening to suggest aortitis/vasculitis.
5. 2.3 cm lesion posterior right liver compatible with cavernous
hemangioma. Smaller cyst noted left hepatic lobe with additional
tiny T2 hyperintense liver lesions too small to characterize.
6. Marked distention of the stomach with food and fluid. No duodenal
dilatation. No obstructing mass lesion, but component of gastric
outlet obstruction not excluded.

## 2020-11-01 MED ORDER — BISACODYL 10 MG RE SUPP
10.0000 mg | Freq: Once | RECTAL | Status: AC
  Administered 2020-11-01: 21:00:00 10 mg via RECTAL
  Filled 2020-11-01: qty 1

## 2020-11-01 MED ORDER — LACTATED RINGERS IV SOLN: INTRAVENOUS | Status: DC

## 2020-11-01 MED ORDER — ONDANSETRON HCL 4 MG/2ML IJ SOLN
4.0000 mg | Freq: Four times a day (QID) | INTRAMUSCULAR | Status: DC
  Administered 2020-11-01 – 2020-11-08 (×24): 4 mg via INTRAVENOUS
  Filled 2020-11-01 (×22): qty 2

## 2020-11-01 MED ORDER — DOCUSATE SODIUM 100 MG PO CAPS
100.0000 mg | ORAL_CAPSULE | Freq: Two times a day (BID) | ORAL | Status: DC | PRN
  Administered 2020-11-01: 100 mg via ORAL
  Filled 2020-11-01: qty 1

## 2020-11-01 MED ORDER — PANTOPRAZOLE SODIUM 40 MG IV SOLR
40.0000 mg | INTRAVENOUS | Status: DC
  Administered 2020-11-01 – 2020-11-08 (×8): 40 mg via INTRAVENOUS
  Filled 2020-11-01 (×8): qty 40

## 2020-11-01 MED ORDER — HYDROMORPHONE HCL 1 MG/ML IJ SOLN
1.0000 mg | INTRAMUSCULAR | Status: DC | PRN
  Administered 2020-11-01 – 2020-11-02 (×8): 1 mg via INTRAVENOUS
  Filled 2020-11-01 (×8): qty 1

## 2020-11-01 MED ORDER — DOCUSATE SODIUM 100 MG PO CAPS
100.0000 mg | ORAL_CAPSULE | Freq: Two times a day (BID) | ORAL | Status: DC
  Administered 2020-11-01 – 2020-11-05 (×6): 100 mg via ORAL
  Filled 2020-11-01 (×6): qty 1

## 2020-11-01 MED ORDER — LEVOTHYROXINE SODIUM 50 MCG PO TABS
50.0000 ug | ORAL_TABLET | Freq: Every day | ORAL | Status: DC
  Administered 2020-11-03 – 2020-11-08 (×5): 50 ug via ORAL
  Filled 2020-11-01 (×5): qty 1

## 2020-11-01 MED ORDER — GADOBUTROL 1 MMOL/ML IV SOLN
8.0000 mL | Freq: Once | INTRAVENOUS | Status: AC | PRN
  Administered 2020-11-01: 8 mL via INTRAVENOUS

## 2020-11-01 MED ORDER — ONDANSETRON HCL 4 MG/2ML IJ SOLN
4.0000 mg | Freq: Four times a day (QID) | INTRAMUSCULAR | Status: DC | PRN
  Administered 2020-11-01 – 2020-11-07 (×6): 4 mg via INTRAVENOUS
  Filled 2020-11-01 (×7): qty 2

## 2020-11-01 MED ORDER — LORAZEPAM 0.5 MG PO TABS: 0.5000 mg | ORAL_TABLET | Freq: Once | ORAL | Status: DC | PRN

## 2020-11-01 MED ORDER — METOCLOPRAMIDE HCL 5 MG/ML IJ SOLN
10.0000 mg | INTRAMUSCULAR | Status: DC | PRN
  Administered 2020-11-01 – 2020-11-03 (×5): 10 mg via INTRAVENOUS
  Filled 2020-11-01 (×5): qty 2

## 2020-11-01 NOTE — ED Notes (Signed)
Patient out of room in imaging.

## 2020-11-01 NOTE — ED Notes (Signed)
Repeat SST sent to lab

## 2020-11-01 NOTE — Plan of Care (Signed)
  Problem: Clinical Measurements: Goal: Will remain free from infection Outcome: Progressing Goal: Diagnostic test results will improve Outcome: Progressing   Problem: Activity: Goal: Risk for activity intolerance will decrease Outcome: Progressing   Problem: Nutrition: Goal: Adequate nutrition will be maintained Outcome: Progressing   Problem: Elimination: Goal: Will not experience complications related to bowel motility Outcome: Progressing Goal: Will not experience complications related to urinary retention Outcome: Progressing   Problem: Pain Managment: Goal: General experience of comfort will improve Outcome: Progressing   Problem: Safety: Goal: Ability to remain free from injury will improve Outcome: Progressing

## 2020-11-01 NOTE — ED Notes (Signed)
Admitting provider contacted for nausea medication.

## 2020-11-01 NOTE — Progress Notes (Signed)
Patient admitted from ED.  A+Ox4.  VSS.  LR infusing 125 mL/hr.  x1 assist to bathroom, falls risk.  Patient being monitored for pancreatitis with pain and nausea management.  Assessment completed. MD aware of patient's arrival.  Needs and call bell within reach.  Daughter at bedside.  Will continue to monitor and assess with POC.

## 2020-11-01 NOTE — Consult Note (Signed)
Kevin Lame, MD [facility]  [address], [place], [place] [address] Phone: [phone] Fax : [phone]  Consultation  Referring Provider:     Dr. Benny Lennert Primary Care Physician:  [name], MD Primary Gastroenterologist:  Dr. Marius Ditch         Reason for Consultation:     Pancreatitis  Date of Admission:  10/31/2020 Date of Consultation:  11/01/2020         HPI:   [facility] is a 80 y.o. male who came to emergency room with a history of having a loss of consciousness after having a tai chi session.  The patient reports that he had a history of lung cancer back in 2009 and was treated at that time.  The patient is originally from [place].  He came to the emergency room and it was reported that he was dizzy and lightheaded.  He also states that he had abdominal pain that was in the lower abdomen and then started to move upwards.  The patient then had pain in the epigastric area associated with nausea.  The patient denies ever having smoked in the past. The abdominal pain was reported to be sharp.  The patient denies any recent medications or alcohol abuse.  He also reports that he has never had his gallbladder out.  He does have a history of GERD and follows up with Dr. Marius Ditch at that time.  The patient had imaging with a MRI scan of the abdomen that showed:  IMPRESSION: 1. Mild intrahepatic biliary duct dilatation with focal narrowing of the common duct in the hepatoduodenal ligament. No discernible obstructing stone or mass lesion evident. ERCP may be warranted to further evaluate. 2. No gallstones or evidence of choledocholithiasis. 3. Diffuse edema in and around the pancreas appears progressive since CT scan 1 day earlier. Pancreatic duct is less dilated. No discrete mass lesion evident. Imaging appearance suggests pancreatitis. 4. Interval progression of retroperitoneal fluid with fluid now visible in the para colic gutters. As on CT yesterday, there  is retroperitoneal edema around the aorta but no substantial aortic wall thickening to suggest aortitis/vasculitis. 5. 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 Smaller cyst noted left hepatic lobe with additional tiny T2 hyperintense liver lesions too small to characterize. 6. Marked distention of the stomach with food and fluid. No duodenal dilatation. No obstructing mass lesion, but component of gastric outlet obstruction not excluded.  The patient had a colonoscopy in March 2021 with 3 polyps found at that time and recommendation for repeat colonoscopy in 7 years.  The patient denies ever having a history of pancreatitis in the past or any similar episodes of abdominal pain.  Past Medical History:  Diagnosis Date  . Actinic keratosis   . Arthritis   . 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 GERD (gastroesophageal reflux disease)    with atypical cough  . Hypothyroidism   . Lung cancer ([place]) 2009   chemo, NO radiation. treated in [place]  . Seborrheic dermatitis     Past Surgical History:  Procedure Laterality Date  . COLONOSCOPY    . COLONOSCOPY WITH PROPOFOL N/A 11/30/2019   Procedure: COLONOSCOPY WITH PROPOFOL;  Surgeon: Lin Landsman, MD;  Location[facility];  Service: Gastroenterology;  Laterality: N/A;  . EYE SURGERY     vitreous detachment---laser  . KNEE CARTILAGE SURGERY Right 1968  . KNEE SURGERY     bilateral meniscal surgeries  . LUNG  LOBECTOMY     upper right lung 12/15/2007   . 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 UPPER GI ENDOSCOPY      Prior to Admission medications   Medication Sig Start Date End Date Taking? Authorizing Provider  cetirizine (ZYRTEC) 10 MG tablet Take 10 mg by mouth daily.   Yes [provider]  cholecalciferol (VITAMIN D3) 25 MCG (1000 UT) tablet Take 5,000 Units by mouth daily.   Yes [provider]  famotidine (PEPCID) 20 MG tablet Take 1 tablet (20 mg total)  by mouth at bedtime. 06/01/20  Yes [name], MD  fluticasone (FLONASE) 50 MCG/ACT nasal spray Place 1 spray into both nostrils 2 (two) times daily.   Yes [provider]  gabapentin (NEURONTIN) 300 MG capsule TAKE 1 CAPSULE BY MOUTH TWICE DAILY 02/13/20  Yes Burnard Hawthorne, FNP  levothyroxine (SYNTHROID) 50 MCG tablet TAKE 1 TABLET BY MOUTH ONCE A DAY BEFOREBREAKFAST. 10/30/20  Yes [name], MD  meloxicam (MOBIC) 15 MG tablet TAKE ONE TABLET BY MOUTH DAILY 07/09/20  Yes Burnard Hawthorne, FNP  montelukast (SINGULAIR) 10 MG tablet TAKE 1 TABLET BY MOUTH EVERY NIGHT AT BEDTIME 04/03/20  Yes Burnard Hawthorne, FNP  Multiple Vitamin (MULTIVITAMIN WITH MINERALS) TABS tablet Take 1 tablet by mouth daily.   Yes [provider]  Probiotic Product (PROBIOTIC PO) Take 1 capsule by mouth 2 (two) times daily.   Yes [provider]  rosuvastatin (CRESTOR) 10 MG tablet Take 1 tablet (10 mg total) by mouth daily. 10/28/19  Yes Burnard Hawthorne, FNP  ketoconazole (NIZORAL) 2 % cream Apply  a small amount to affected area twice a day Patient not taking: No sig reported 08/22/19   [provider]  nystatin-triamcinolone ointment (MYCOLOG) Apply 1 application topically 2 (two) times daily. Patient not taking: No sig reported 06/28/20   Billey Co, MD  Olopatadine HCl 0.2 % SOLN as needed.  Patient not taking: No sig reported    [provider]  tacrolimus (PROTOPIC) 0.1 % ointment Apply topically 2 (two) times daily. 07/04/20   Moye, [place], MD  tacrolimus (PROTOPIC) 0.1 % ointment Apply topically in the morning and at bedtime. Patient not taking: No sig reported 07/24/20   Moye, [place], MD  triamcinolone ointment (KENALOG) 0.1 % APPLY TO HANDS AT BEDTIME AND COVER WITHGLOVES AS NEEDED FOR FLARES. AVOID FACE/GROIN/AXILLA. Patient not taking: No sig reported 06/11/20   Alfonso Patten, MD    Family History  Problem Relation Age of Onset  .  Cancer Mother   . Alzheimer's disease Mother   . Stroke Maternal Grandmother   . Hypertension Father   . Alzheimer's disease Father      Social History   Tobacco Use  . Smoking status: Never Smoker  . Smokeless tobacco: Never Used  Vaping Use  . Vaping Use: Never used  Substance Use Topics  . Alcohol use: Yes    Comment: Once glass of wine per month, none last 24hrs  . Drug use: Never    Allergies as of 10/31/2020 - Review Complete 10/31/2020  Allergen Reaction Noted  . Compazine [prochlorperazine edisylate]  05/17/2018  . Other  05/17/2018    Review of Systems:    All systems reviewed and negative except where noted in HPI.   Physical Exam:  Vital signs in last 24 hours: Temp:  [97.8 F (36.6 C)-98.3 F (36.8 C)] 98.3 F (36.8 C) (03/03 1221) Pulse Rate:  [53-99]  99 (03/03 1221) Resp:  [12-21] 18 (03/03 1221) BP: (105-137)/(56-83) 115/81 (03/03 1221) SpO2:  [91 %-100 %] 91 % (03/03 1221) Weight:  [81.6 kg] 81.6 kg (03/02 1744)   General:   Pleasant, cooperative in NAD Head:  Normocephalic and atraumatic. Eyes:   No icterus.   Conjunctiva pink. PERRLA. Ears:  Normal auditory acuity. Neck:  Supple; no masses or thyroidomegaly Lungs: Respirations even and unlabored. Lungs clear to auscultation bilaterally.   No wheezes, crackles, or rhonchi.  Heart:  Regular rate and rhythm;  Without murmur, clicks, rubs or gallops Abdomen:  Soft, nondistended, positive epigastric tenderness. Normal bowel sounds. No appreciable masses or hepatomegaly.  No rebound or guarding.  Rectal:  Not performed. Msk:  Symmetrical without gross deformities.    Extremities:  Without edema, cyanosis or clubbing. Neurologic:  Alert and oriented x3;  grossly normal neurologically. Skin:  Intact without significant lesions or rashes. Cervical Nodes:  No significant cervical adenopathy. Psych:  Alert and cooperative. Normal affect.  LAB RESULTS: Recent Labs    10/31/20 1757 11/01/20 0434  WBC  10.6* 14.5*  HGB 13.9 13.6  HCT 42.4 40.8  PLT 146* 156   BMET Recent Labs    10/31/20 1757 11/01/20 0434  NA 135 133*  K 5.1 4.2  CL 101 99  CO2 23 24  GLUCOSE 118* 139*  BUN 16 19  CREATININE 1.00 0.77  CALCIUM 9.1 8.0*   LFT Recent Labs    11/01/20 0434  PROT 6.0*  ALBUMIN 3.5  AST 24  ALT 12  ALKPHOS 48  BILITOT 0.9   PT/INR Recent Labs    11/01/20 0434  LABPROT 15.4*  INR 1.3*    STUDIES: DG Chest 2 View  Result Date: 10/31/2020 CLINICAL DATA:  Syncope EXAM: CHEST - 2 VIEW COMPARISON:  None. FINDINGS: Heart size and mediastinal contours are within normal limits. Lungs are clear. No pleural effusion or pneumothorax is seen. No acute appearing osseous abnormality. IMPRESSION: No active cardiopulmonary disease. Electronically Signed   By: Franki Cabot M.D.   On: 10/31/2020 18:30   CT ABDOMEN PELVIS W CONTRAST  Result Date: 10/31/2020 CLINICAL DATA:  Abdominal pain. EXAM: CT ABDOMEN AND PELVIS WITH CONTRAST TECHNIQUE: Multidetector CT imaging of the abdomen and pelvis was performed using the standard protocol following bolus administration of intravenous contrast. CONTRAST:  160m OMNIPAQUE IOHEXOL 300 MG/ML  SOLN COMPARISON:  None. FINDINGS: Lower chest: No acute abnormality. Hepatobiliary: 2 cm hypodense lesion within the posterior-superior RIGHT liver lobe, indeterminate but most suggestive of a benign hemangioma. Gallbladder appears normal. No bile duct dilatation is seen. Pancreas: Pancreatic duct is dilated to approximately 6 mm. No obstructing mass or stone is identified within the pancreas. Spleen: Normal in size without focal abnormality. Adrenals/Urinary Tract: Adrenal glands appear normal. Kidneys are unremarkable without mass, stone or hydronephrosis. No ureteral or bladder calculi are identified. Stomach/Bowel: No dilated large or small bowel loops are seen. No evidence of bowel wall inflammation. Appendix is normal. Vascular/Lymphatic: Aortic  atherosclerosis. Reproductive: Prostate gland is mildly prominent in size. Other: There is an ill-defined/hazy density throughout the periaortic retroperitoneum with envelopment of both the aorta and portions of the inferior vena cava and with extension anteriorly about the superior mesenteric vessels (and proximal branches), and with scattered mildly prominent lymph nodes. This is new compared to earlier chest CT of 11/28/2019. Walls of the adjacent abdominal aorta are unremarkable, without obvious wall thickening or wall enhancement to suggest aortitis. Musculoskeletal: No acute appearing osseous abnormality.  Lucency within the L5 vertebral body is likely related to overlying degenerative disc disease. IMPRESSION: 1. 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 This is new compared to earlier chest CT of 11/28/2019. Differential considerations include but are not limited to rapidly developing retroperitoneal fibrosis and neoplastic process such as lymphoma. Aortitis is considered much less likely due to the distribution and the lack of aortic wall thickening. Consider further characterization with PET-CT. Surgical exploration with biopsy may eventually be needed for definitive characterization. 2. Pancreatic duct is dilated to approximately 6 mm. No obstructing mass or stone is identified within the pancreas. No fluid about the pancreas itself to suggest acute pancreatitis. Recommend further characterization with nonemergent pancreatic MRI. 3. 2 cm hypodense lesion within the posterior-superior RIGHT liver lobe, indeterminate but most suggestive of a benign hemangioma. Consider confirmation with liver MRI. Aortic Atherosclerosis (ICD10-I70.0). Electronically  Signed   By: Franki Cabot M.D.   On: 10/31/2020 19:15   MR 3D Recon At Scanner  Result Date: 11/01/2020 CLINICAL DATA:  Pancreatic ductal dilatation. EXAM: MRI ABDOMEN WITHOUT AND WITH CONTRAST (INCLUDING MRCP) TECHNIQUE: Multiplanar multisequence MR imaging of the abdomen was performed both before and after the administration of intravenous contrast. Heavily T2-weighted images of the biliary and pancreatic ducts were obtained, and three-dimensional MRCP images were rendered by post processing. CONTRAST:  17m GADAVIST GADOBUTROL 1 MMOL/ML IV SOLN COMPARISON:  CT scan 10/31/2020. FINDINGS: Lower chest: Unremarkable. Hepatobiliary: 2.3 cm lesion noted posterior right liver showing some peripheral nodular enhancement after IV contrast administration. No substantial filling in on more delayed imaging. This lesion was present on a chest CT from 11/28/2019 and is stable in size in the interval. 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 1.4 cm simple cyst noted in the dome of the left liver several other scattered tiny T2 hyperintensities are too small to characterize. There is no evidence for gallstones, gallbladder wall thickening, or pericholecystic fluid. Mild intrahepatic biliary duct dilatation noted with focal narrowing of the common duct in the hepatoduodenal ligament (see coronal thin MRCP image 12 of series 14 and postcontrast coronal image 43 of series 23. This finding is also demonstrated on 3D MRCP image 3 of series 10/21 and image 1 of series 1032. No discernible obstructing stone or mass lesion evident. Heterogeneous perfusion of liver parenchyma evident on arterial phase imaging. Pancreas: Diffuse edema in and around the pancreas appears progressive since CT scan 1 day earlier. Pancreatic duct is less dilated measuring 3 mm diameter in the body and 4 mm diameter in the head of pancreas. No discrete pancreatic mass lesion evident. Spleen:  No splenomegaly. No focal mass lesion.  Adrenals/Urinary Tract: No adrenal nodule or mass. No gross suspicious abnormality in either kidney. Stomach/Bowel: Stomach is markedly distended with food and fluid. Duodenum is nondistended with duodenal diverticulum evident. No small bowel or colonic dilatation within the visualized abdomen. Vascular/Lymphatic: No abdominal aortic aneurysm no substantial wall thickening or mural enhancement in the aorta. Variant anatomy with common trunk for the celiac axis and SMA noted. Other: As seen on previous CT, there is diffuse retroperitoneal edema, progressive in the interval with fluid now visible in the para colic gutters bilaterally Musculoskeletal: No focal suspicious marrow enhancement within the visualized bony anatomy. IMPRESSION: 1. Mild intrahepatic biliary duct dilatation with focal narrowing of the common  duct in the hepatoduodenal ligament. No discernible obstructing stone or mass lesion evident. ERCP may be warranted to further evaluate. 2. No gallstones or evidence of choledocholithiasis. 3. Diffuse edema in and around the pancreas appears progressive since CT scan 1 day earlier. Pancreatic duct is less dilated. No discrete mass lesion evident. Imaging appearance suggests pancreatitis. 4. Interval progression of retroperitoneal fluid with fluid now visible in the para colic gutters. As on CT yesterday, there is retroperitoneal edema around the aorta but no substantial aortic wall thickening to suggest aortitis/vasculitis. 5. 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 Smaller cyst noted left hepatic lobe with additional tiny T2 hyperintense liver lesions too small to characterize. 6. Marked distention of the stomach with food and fluid. No duodenal dilatation. No obstructing mass lesion, but component of gastric outlet obstruction not excluded. Electronically Signed   By: Misty Stanley M.D.   On: 11/01/2020 07:54   MR ABDOMEN MRCP W WO CONTAST  Result Date:  11/01/2020 CLINICAL DATA:  Pancreatic ductal dilatation. EXAM: MRI ABDOMEN WITHOUT AND WITH CONTRAST (INCLUDING MRCP) TECHNIQUE: Multiplanar multisequence MR imaging of the abdomen was performed both before and after the administration of intravenous contrast. Heavily T2-weighted images of the biliary and pancreatic ducts were obtained, and three-dimensional MRCP images were rendered by post processing. CONTRAST:  28m GADAVIST GADOBUTROL 1 MMOL/ML IV SOLN COMPARISON:  CT scan 10/31/2020. FINDINGS: Lower chest: Unremarkable. Hepatobiliary: 2.3 cm lesion noted posterior right liver showing some peripheral nodular enhancement after IV contrast administration. No substantial filling in on more delayed imaging. This lesion was present on a chest CT from 11/28/2019 and is stable in size in the interval. 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 1.4 cm simple cyst noted in the dome of the left liver several other scattered tiny T2 hyperintensities are too small to characterize. There is no evidence for gallstones, gallbladder wall thickening, or pericholecystic fluid. Mild intrahepatic biliary duct dilatation noted with focal narrowing of the common duct in the hepatoduodenal ligament (see coronal thin MRCP image 12 of series 14 and postcontrast coronal image 43 of series 23. This finding is also demonstrated on 3D MRCP image 3 of series 10/21 and image 1 of series 1032. No discernible obstructing stone or mass lesion evident. Heterogeneous perfusion of liver parenchyma evident on arterial phase imaging. Pancreas: Diffuse edema in and around the pancreas appears progressive since CT scan 1 day earlier. Pancreatic duct is less dilated measuring 3 mm diameter in the body and 4 mm diameter in the head of pancreas. No discrete pancreatic mass lesion evident. Spleen:  No splenomegaly. No focal mass lesion. Adrenals/Urinary Tract: No adrenal nodule or mass. No gross suspicious abnormality in either kidney.  Stomach/Bowel: Stomach is markedly distended with food and fluid. Duodenum is nondistended with duodenal diverticulum evident. No small bowel or colonic dilatation within the visualized abdomen. Vascular/Lymphatic: No abdominal aortic aneurysm no substantial wall thickening or mural enhancement in the aorta. Variant anatomy with common trunk for the celiac axis and SMA noted. Other: As seen on previous CT, there is diffuse retroperitoneal edema, progressive in the interval with fluid now visible in the para colic gutters bilaterally Musculoskeletal: No focal suspicious marrow enhancement within the visualized bony anatomy. IMPRESSION: 1. Mild intrahepatic biliary duct dilatation with focal narrowing of the common duct in the hepatoduodenal ligament. No discernible obstructing stone or mass lesion evident. ERCP may be warranted to further evaluate. 2. No gallstones or evidence of choledocholithiasis. 3. Diffuse edema in and around  the pancreas appears progressive since CT scan 1 day earlier. Pancreatic duct is less dilated. No discrete mass lesion evident. Imaging appearance suggests pancreatitis. 4. Interval progression of retroperitoneal fluid with fluid now visible in the para colic gutters. As on CT yesterday, there is retroperitoneal edema around the aorta but no substantial aortic wall thickening to suggest aortitis/vasculitis. 5. 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 Smaller cyst noted left hepatic lobe with additional tiny T2 hyperintense liver lesions too small to characterize. 6. Marked distention of the stomach with food and fluid. No duodenal dilatation. No obstructing mass lesion, but component of gastric outlet obstruction not excluded. Electronically Signed   By: Misty Stanley M.D.   On: 11/01/2020 07:54   US SCROTUM W/DOPPLER  Result Date: 10/31/2020 CLINICAL DATA:  Bilateral scrotal pain EXAM: SCROTAL ULTRASOUND DOPPLER ULTRASOUND OF THE TESTICLES TECHNIQUE:  Complete ultrasound examination of the testicles, epididymis, and other scrotal structures was performed. Color and spectral Doppler ultrasound were also utilized to evaluate blood flow to the testicles. COMPARISON:  None. FINDINGS: Right testicle Measurements: 3.7 x 2.4 x 3.2 cm. No mass or microlithiasis visualized. Left testicle Measurements: 4.0 x 2.5 x 2.8 cm. No mass or microlithiasis visualized. Right epididymis: Normal in size. Slightly heterogeneous in appearance. Small scattered calcifications are seen. Left epididymis: Normal in size. Slightly heterogeneous in appearance. Hydrocele:  None visualized. Varicocele:  None visualized. Pulsed Doppler interrogation of both testes demonstrates normal low resistance arterial and venous waveforms bilaterally. IMPRESSION: Slightly heterogeneous bilateral epididymi, however no definite evidence of epididymitis. Normal appearing testicles. Electronically Signed   By: Prudencio Pair M.D.   On: 10/31/2020 23:07      Impression / Plan:   Assessment: Principal Problem:   Syncope Active Problems:   Hypothyroidism   Gastroesophageal reflux disease without esophagitis   Abdominal pain   Dilation of pancreatic duct   Dehydration   Keshun Berrett is a 80 y.o. y/o male with who was admitted with near syncope and had a MRI showing a mildly dilated common bile duct with pancreatitis.  The patient's white cell count was elevated on admission at 10.6 that has gone up to 14.5 today.  The patient's liver enzymes showed a increased bilirubin on admission that came back to normal today while the rest of the liver enzymes were normal.  The patient's lipase was found to be 2209 on admission yesterday.  Plan:  The patient will be started on a clear liquid diet.  The patient appears to have uncomplicated pancreatitis with mildly dilated duct and no common bile duct stone seen.  The patient may have passed a stone which would explain the normalization of his bilirubin.   The patient also reports that his pain is slightly better now than it was before.  I would recommend watching the patient overnight and recheck his labs in the morning.  The patient will be made n.p.o. after midnight in case his labs do not improve then he may need a ERCP.  Otherwise if he continues to improve that it is likely that he passed a common bile duct stone causing the pancreatitis and an ERCP would not be needed.  The patient has been explained the plan agrees with it.  Thank you for involving me in the care of this patient.      LOS: 1 day   Kevin Lame, MD, [facility] 11/01/2020, 1:01 PM,  Pager [phone] 7am-5pm  Check AMION for 5pm -7am coverage and on weekends  Note: This dictation was prepared with Dragon dictation along with smaller phrase technology. Any transcriptional errors that result from this process are unintentional.

## 2020-11-01 NOTE — Progress Notes (Signed)
[facility], daughter, is patient's contact person.  Her number is [phone].  Patient states that it will be ok to contact her daughter for updates.  Thanks.

## 2020-11-01 NOTE — Progress Notes (Signed)
PROGRESS NOTE  Kevin Terrell UGQ:[id number] DOB: 11-Mar-1941 DOA: 10/31/2020 PCP: [name], MD  Brief History   The patient is a 80 yr old man who presented to [facility] ED on 10/31/2020 with complaints of syncopal episode. The patient was doing tai chi when he developed dizziness and lightheadedness. He felt that he was going to lose consciousness. He sat down. He then passed out. He did not hit his head or harm himself after passing out. There was no confusion, headache, acute focal weakness, acute focal numbness, paresthesias, dysarthria, facial droop, vertigo, or dysphagia after he regained consciousness. He states that he was out for a few seconds. This episode was also not associated with  any tonic-clonic activity, tongue biting, or loss of bowel/bladder function. he reports a history of 1 prior syncopal event that occurred approximately 30 years ago while he lived in [place]. He was found to be orthostatic on that occasion.  He also reports one day of diffuse abdominal discomfort that was sharp, and located in the right upper quadrant and epigastrum. He is complaining of severe GERD and nausea.  MRCP was obtained and demonstrated acute pancreatitis, dilated pancreatic duct, and CBD. There is also a question of obstruction of gallbladder. LFT's were elevated on admissionGI was consulted. The patient has been evaluated by Dr. Allen Norris. He suggests that possibly patient has passed stone. He recommends that the patient be placed on clear liquid diet and monitor over the next day. If he does not improve the patient may need an ERCP. If he improves perhaps he can be discharged.  Consultants  . Gastroenterology  Procedures  . None  Antibiotics   Anti-infectives (From admission, onward)   None    .  Subjective  The patient is resting on the gurney. He is being roomed in the ED. He is complaining of severe GERD, abdominal pain, and nausea.   Objective   Vitals:  Vitals:   11/01/20 1133  11/01/20 1221  BP: 112/83 115/81  Pulse:  99  Resp:  18  Temp:  98.3 F (36.8 C)  SpO2:  91%    Exam:  Constitutional:  . The patient is awake, alert, and oriented x 3. No acute distress. Respiratory:  . No increased work of breathing. . No wheezes, rales, or rhonchi . No tactile fremitus Cardiovascular:  . Regular rate and rhythm . No murmurs, ectopy, or gallups. . No lateral PMI. No thrills. Abdomen:  . Abdomen is soft, there is tenderness in the RUQ and epigastrum,  non-distended . No hernias, masses, or organomegaly . Hypoactive bowel sounds.  Musculoskeletal:  . No cyanosis, clubbing, or edema Skin:  . No rashes, lesions, ulcers . palpation of skin: no induration or nodules Neurologic:  . CN 2-12 intact . Sensation all 4 extremities intact Psychiatric:  . Mental status o Mood, affect appropriate o Orientation to person, place, time  . judgment and insight appear intact   I have personally reviewed the following:   Today's Data  . Vitals, CMP, CBC  Micro Data  . Blood cultures x 2 obtained.  Imaging  . MRCP  Cardiology Data  . EKG  Scheduled Meds: . ondansetron (ZOFRAN) IV  4 mg Intravenous Q6H  . pantoprazole (PROTONIX) IV  40 mg Intravenous Q24H   Continuous Infusions: . lactated ringers 125 mL/hr at 11/01/20 1402    Principal Problem:   Syncope Active Problems:   Hypothyroidism   Gastroesophageal reflux disease without esophagitis   Abdominal pain   Dilation  of pancreatic duct   Dehydration   Acute pancreatitis   LOS: 1 day   A & P   Syncope:Likely vasovagal complicated by hypovolemia due to poor PO intake for the past couple of days. The patient has received IV fluids. Orthostatics are still pending. Volume status will be carefully monitored. Echocardiogram is pending.   Acute pancreatitis with dilation of pancreatic duct on ERCP: Lipase 2209 on admission. T bili 1.3 on admission, down to 0.9 this morning. The patient states that  he feels better.  CT abdomen/pelvis/MRCP with contrast demonstrates dilation of the pancreatic duct to 6 mm without overt obstructing stone/mass and without overt pancreatic wall thickening or peripancreatic fluid noted, while demonstrating no evidence of acute cholecystitis or choledocholithiasis. There is evidence of acute pancreatitis. Etiology is unclear. Patient is not a drinker. Triglycerides are normal. No history of pancreatitis in family or personally for this patient. He is on crestor at home which does have pancreatitis as an adverse reaction, although GI feels that it is likely related to the patient passing a stone recently and the associated inflammation. Will hold crestor as inpatient.  In the meantime we will treat conservatively with IV fluids, as needed IV analgesia, and allow for bowel rest.   The patient has been placed on a clear liquid diet. He has as needed antiemetics and pain medication. Blood cultures x 2 were obtained. Monitor LFT's and CBC.   Dehydration: Diagnosis on the basis of physical exam evidence that includes dry oral mucous membrane, in addition to suggestion of dehydration stemming from urinalysis result, included elevated specific gravity of 1.046.  This appears to be on the basis of recent decline in oral intake of water over the last 2 to 3 days, as further noted above.  Orthostatics are pending. Continue IV fluids.   Plan: Continue IV fluids, as above.  Monitor strict I's and O's and daily weights.  Recommend management of presenting abdominal discomfort, as above.  Repeat CMP in the morning.  GERD: Patient confirms a history of such, and acknowledges good compliance with his home daily Pepcid.  Noted continue Protonix. He reports that he previously underwent EGD approximately 10 to 12 years ago which showed no evidence of acute process, including no evidence of mass, esophagitis, gastritis, or peptic ulcer disease.   Acquired hypothyroidism: Continue synthroid  as at home.  Osteoarthritis of the bilateral knees: On daily meloxicam in the absence of any additional NSAIDs at home. Holding NSAIDS at this time.   I have seen and examined this patient myself. I have spent 38 minutes in his evaluation and care.  DVT prophylaxis: scd's  Code Status: Full code Family Communication: Case was discussed with the patient's daughter, who was present at bedside. Disposition Plan:  Status is: Inpatient  Remains inpatient appropriate because:Ongoing diagnostic testing needed not appropriate for outpatient work up  Dispo: The patient is from: Home              Anticipated d/c is to: Home              Patient currently is not medically stable to d/c.   Difficult to place patient No  Jameah Rouser, DO [facility] Direct contact: see [email or web address]  7PM-7AM contact night coverage as above 11/01/2020, 3:12 PM  LOS: 1 day

## 2020-11-01 NOTE — ED Notes (Addendum)
Patient back to room from imaging and complaining of persistent nausea despite medication. Inpatient provider messaged about different nausea medication.

## 2020-11-02 ENCOUNTER — Encounter: Payer: Self-pay | Admitting: Internal Medicine

## 2020-11-02 ENCOUNTER — Inpatient Hospital Stay: Payer: Medicare Other

## 2020-11-02 DIAGNOSIS — K859 Acute pancreatitis without necrosis or infection, unspecified: Secondary | ICD-10-CM | POA: Diagnosis not present

## 2020-11-02 LAB — CALCIUM, IONIZED: Calcium, Ionized, Serum: 4.3 mg/dL — ABNORMAL LOW (ref 4.5–5.6)

## 2020-11-02 LAB — COMPREHENSIVE METABOLIC PANEL
ALT: 12 U/L (ref 0–44)
AST: 26 U/L (ref 15–41)
Albumin: 3.2 g/dL — ABNORMAL LOW (ref 3.5–5.0)
Alkaline Phosphatase: 58 U/L (ref 38–126)
Anion gap: 13 (ref 5–15)
BUN: 36 mg/dL — ABNORMAL HIGH (ref 8–23)
CO2: 27 mmol/L (ref 22–32)
Calcium: 7.8 mg/dL — ABNORMAL LOW (ref 8.9–10.3)
Chloride: 95 mmol/L — ABNORMAL LOW (ref 98–111)
Creatinine, Ser: 1.06 mg/dL (ref 0.61–1.24)
GFR, Estimated: >60 mL/min (ref >60)
Glucose, Bld: 119 mg/dL — ABNORMAL HIGH (ref 70–99)
Potassium: 3.8 mmol/L (ref 3.5–5.1)
Sodium: 135 mmol/L (ref 135–145)
Total Bilirubin: 0.6 mg/dL (ref 0.3–1.2)
Total Protein: 6.2 g/dL — ABNORMAL LOW (ref 6.5–8.1)

## 2020-11-02 LAB — CBC WITH DIFFERENTIAL/PLATELET
Abs Immature Granulocytes: 0.08 10*3/uL — ABNORMAL HIGH (ref 0.00–0.07)
Basophils Absolute: 0 10*3/uL (ref 0.0–0.1)
Basophils Relative: 0 %
Eosinophils Absolute: 0 10*3/uL (ref 0.0–0.5)
Eosinophils Relative: 0 %
HCT: 47.2 % (ref 39.0–52.0)
Hemoglobin: 16.3 g/dL (ref 13.0–17.0)
Immature Granulocytes: 0 %
Lymphocytes Relative: 4 %
Lymphs Abs: 0.8 10*3/uL (ref 0.7–4.0)
MCH: 31.1 pg (ref 26.0–34.0)
MCHC: 34.5 g/dL (ref 30.0–36.0)
MCV: 90.1 fL (ref 80.0–100.0)
Monocytes Absolute: 2.1 10*3/uL — ABNORMAL HIGH (ref 0.1–1.0)
Monocytes Relative: 10 %
Neutro Abs: 17.7 10*3/uL — ABNORMAL HIGH (ref 1.7–7.7)
Neutrophils Relative %: 86 %
Platelets: 184 10*3/uL (ref 150–400)
RBC: 5.24 MIL/uL (ref 4.22–5.81)
RDW: 12.3 % (ref 11.5–15.5)
WBC: 20.7 10*3/uL — ABNORMAL HIGH (ref 4.0–10.5)
nRBC: 0 % (ref 0.0–0.2)

## 2020-11-02 LAB — LIPASE, BLOOD: Lipase: 150 U/L — ABNORMAL HIGH (ref 11–51)

## 2020-11-02 IMAGING — CT CT ABD-PELV W/ CM
2 of 5 series · 15 of 46 positions shown, 17 images · IV contrast (APPLIED)
Comparison: MRI abdomen [DATE], CT abdomen pelvis [DATE]

CLINICAL DATA: Diffuse abdominal discomfort that was sharp, and
located in the right upper quadrant and epigastrum. He is
complaining of severe GERD and nausea. Pancreatitis, acute, severe

EXAM:
CT ABDOMEN AND PELVIS WITH CONTRAST
TECHNIQUE: Multidetector CT imaging of the abdomen and pelvis was performed
using the standard protocol following bolus administration of
intravenous contrast.
CONTRAST:  <See Chart> OMNIPAQUE IOHEXOL 240 MG/ML SOLN, 100mL
OMNIPAQUE IOHEXOL 300 MG/ML SOLN

[Series 2: routine abd/pel with · axial · 0.83mm/px · z∈[-813,-353]mm · 12 of 104 slices shown, 14 images]
[im 6/104  soft-tissue]
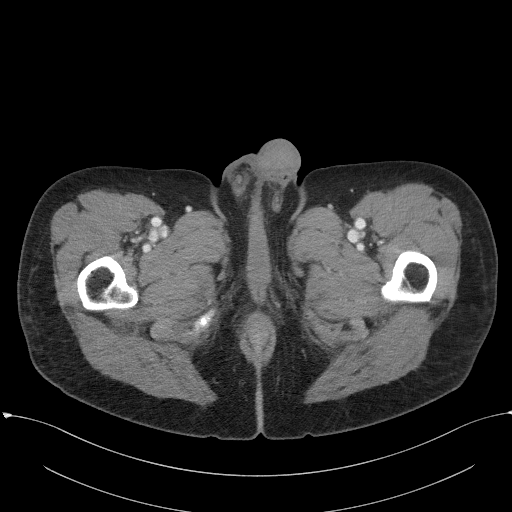
[im 6/104  bone]
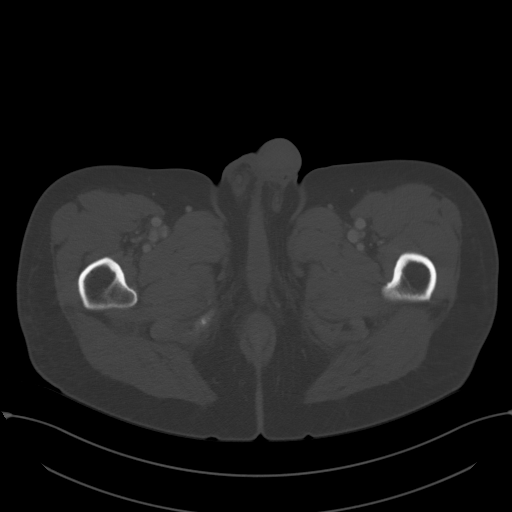
[im 16/104  soft-tissue]
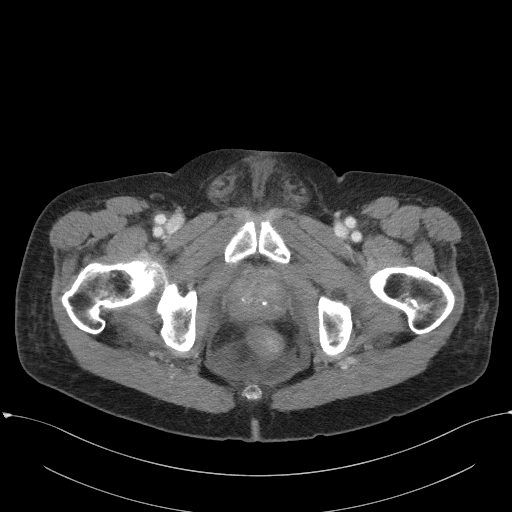
[im 21/104  soft-tissue]
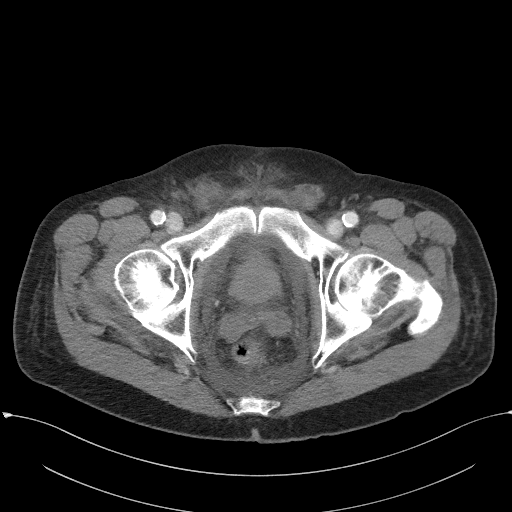
[im 31/104  soft-tissue]
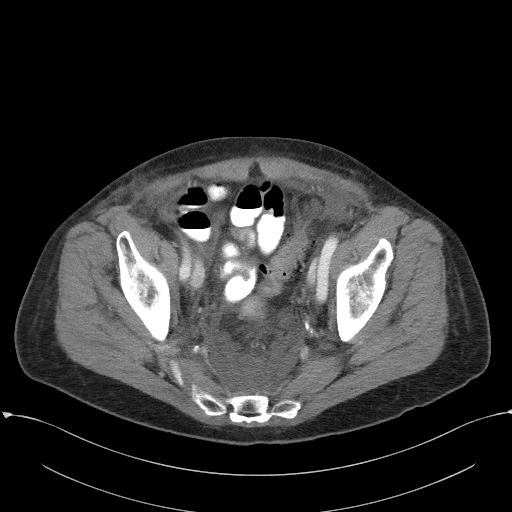
[im 42/104  soft-tissue]
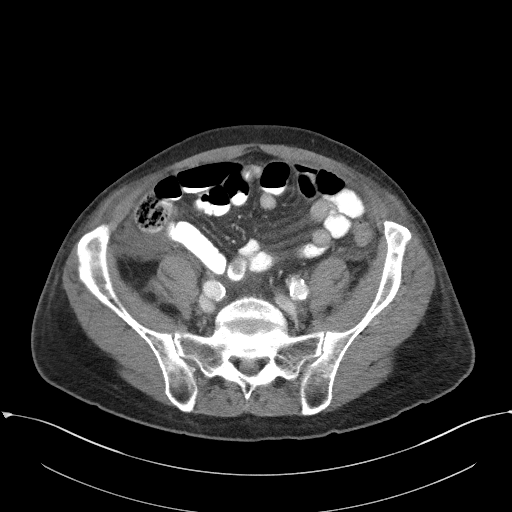
[im 47/104  soft-tissue]
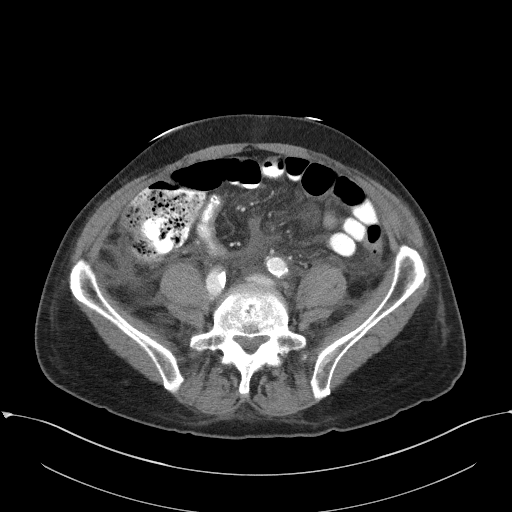
[im 57/104  soft-tissue]
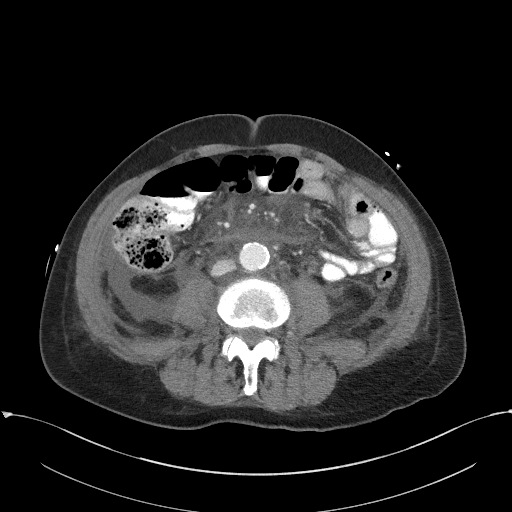
[im 62/104  soft-tissue]
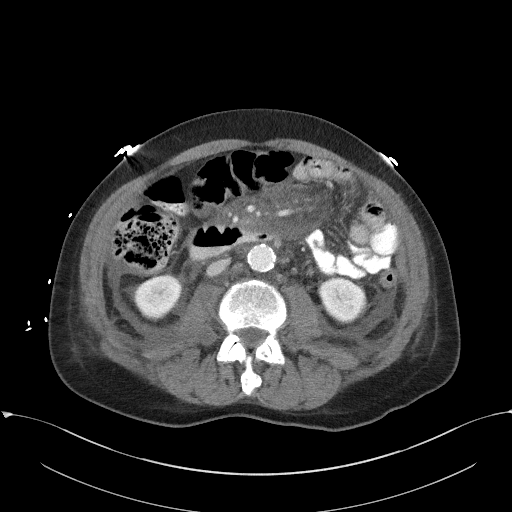
[im 73/104  soft-tissue]
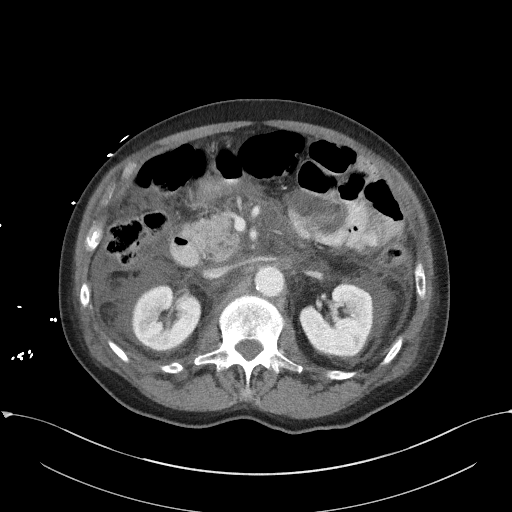
[im 73/104  bone]
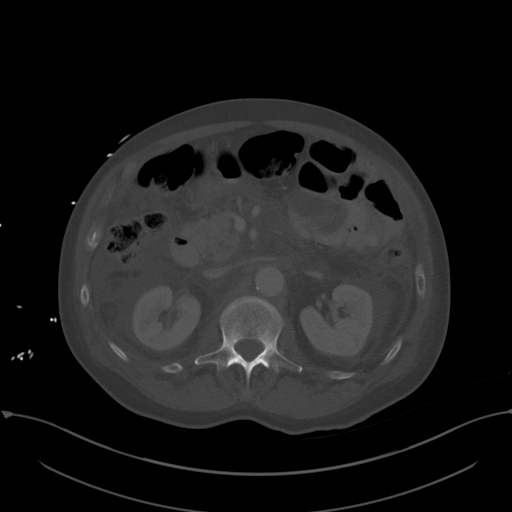
[im 83/104  soft-tissue]
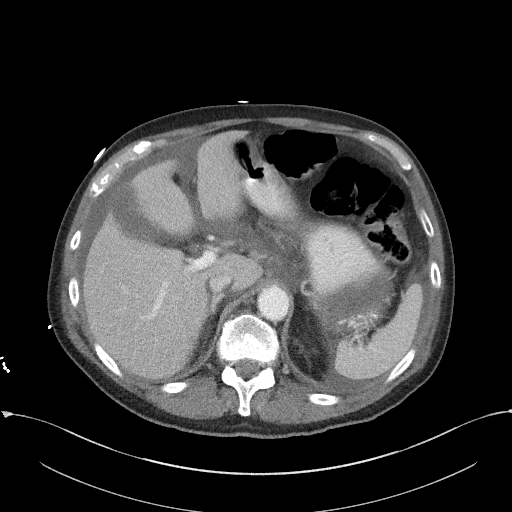
[im 88/104  soft-tissue]
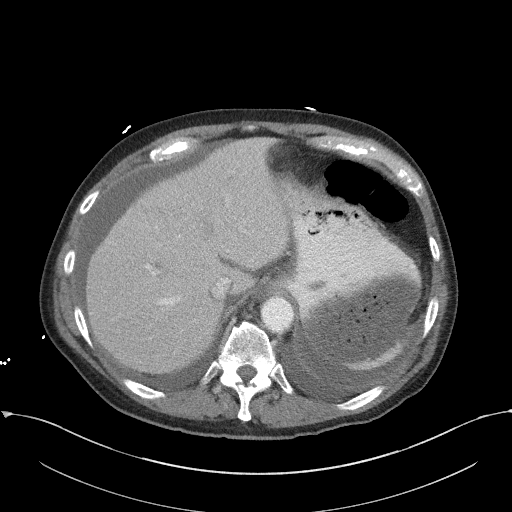
[im 98/104  soft-tissue]
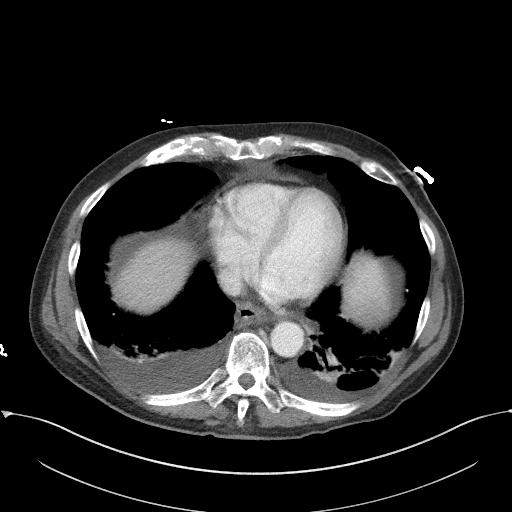

[Series 5: coronal st · coronal · 0.83mm/px · 3 of 94 slices shown]
[im 32/94  soft-tissue]
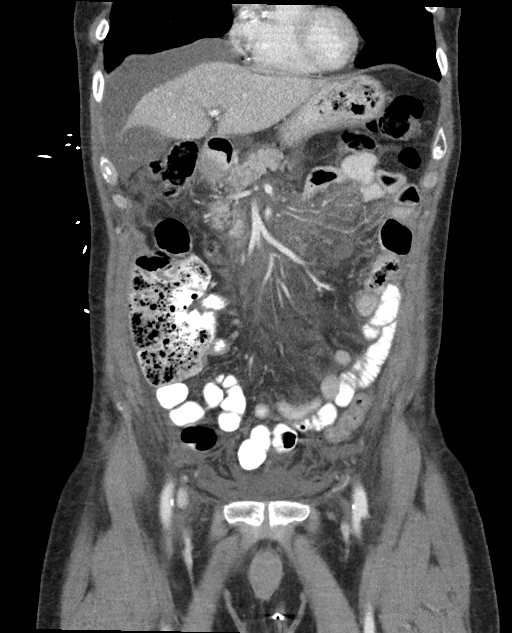
[im 42/94  soft-tissue]
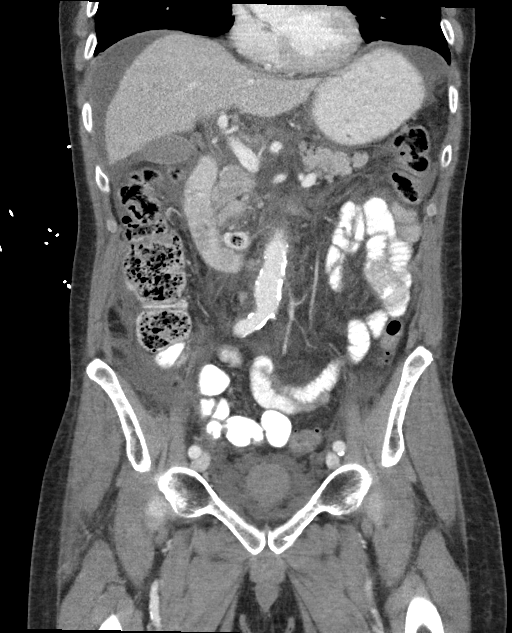
[im 52/94  soft-tissue]
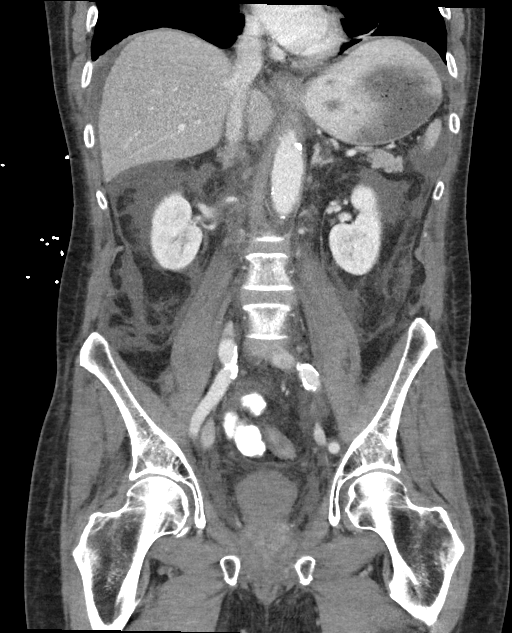

[15 of 46 positions shown; findings below may reference images not displayed]

FINDINGS: Lower chest: Interval development of bilateral trace volume pleural
effusions with associated passive atelectasis bilateral lower lobes.
Coronary artery calcifications.

Hepatobiliary: Redemonstration of a stable 2.1 cm hypodensity within
the right hepatic lobe ([DATE]) that is consistent with a known
hepatic hemangioma better evaluated on MRI abdomen [DATE].
Subcentimeter hypodensities are again noted and too small to
characterize. No focal liver abnormality. Layering high density
within the gallbladder lumen likely represent vicarious excretion of
previously administered intravenous contrast. No gallstones,
gallbladder wall thickening, or pericholecystic fluid. No biliary
dilatation.

Pancreas: Hazy pancreatic contour with interval increase in
peripancreatic fat stranding and free fluid. No pseudocyst
formation. Focal borderline dilatation of the main pancreatic duct
in the proximal pancreas is poorly visualized but still present.
Finding better evaluated on MRI abdomen [DATE]. No diffuse main
pancreatic duct dilatation.

Spleen: Normal in size without focal abnormality.

Adrenals/Urinary Tract: No adrenal nodule bilaterally. Bilateral
kidneys enhance symmetrically. No hydronephrosis. No hydroureter.
The urinary bladder is unremarkable. On delayed imaging, there is no
urothelial wall thickening and there are no filling defects in the
opacified portions of the bilateral collecting systems or ureters.

Stomach/Bowel: Stomach is within normal limits. No evidence of bowel
wall thickening or dilatation. Appendix appears normal.

Vascular/Lymphatic: The portal, splenic, superior mesenteric veins
are patent. Limited evaluation of the splenic artery on this portal
venous single phase contrast study. No abdominal aorta or iliac
aneurysm. Severe atherosclerotic plaque of the aorta and its
branches. Redemonstration of ill-defined hazy density throughout the
retroperitoneum along the aorta andinferior vena cava with extension
anteriorly to the mesenteric vessels suggestive of possibly
lymphoproliferative disorder.

Reproductive: The prostate is enlarged measuring up to 5.1 cm.

Other: Interval increase in moderate volume simple fluid ascites. No
intraperitoneal free gas. No organized fluid collection.

Musculoskeletal:

No abdominal wall hernia or abnormality.

No suspicious lytic or blastic osseous lesions. No acute displaced
fracture. Multilevel degenerative changes of the spine.
IMPRESSION: 1. Acute pancreatitis with interval increase in moderate volume
simple free fluid ascites. No pseudocyst formation.
2. Persistent ill-defined hazy density throughout the
retroperitoneum along the aorta and inferior vena cava with
extension anteriorly to the mesenteric vessels suggestive of
possibly lymphoproliferative disorder versus retroperitoneal
fibrosis. Recommend PET-CT for further evaluation.
3. Sigmoid diverticulosis with no acute diverticulitis.
4.  Aortic Atherosclerosis ([VQ]-[VQ]).

## 2020-11-02 MED ORDER — IOHEXOL 300 MG/ML  SOLN
100.0000 mL | Freq: Once | INTRAMUSCULAR | Status: AC | PRN
  Administered 2020-11-02: 100 mL via INTRAVENOUS

## 2020-11-02 MED ORDER — IOHEXOL 240 MG/ML SOLN
25.0000 mL | INTRAMUSCULAR | Status: AC
  Administered 2020-11-02 (×2): 25 mL via ORAL

## 2020-11-02 MED ORDER — ALUM & MAG HYDROXIDE-SIMETH 200-200-20 MG/5ML PO SUSP
30.0000 mL | Freq: Once | ORAL | Status: AC
  Administered 2020-11-02: 30 mL via ORAL
  Filled 2020-11-02: qty 30

## 2020-11-02 MED ORDER — ACETAMINOPHEN 325 MG PO TABS: 650.0000 mg | ORAL_TABLET | ORAL | Status: DC | PRN

## 2020-11-02 NOTE — Progress Notes (Signed)
PROGRESS NOTE  Kevin Terrell MDY:[id number] DOB: Feb 21, 1941 DOA: 10/31/2020 PCP: [name], MD  Brief History   The patient is a 80 yr old man who presented to [facility] ED on 10/31/2020 with complaints of syncopal episode. The patient was doing tai chi when he developed dizziness and lightheadedness. He felt that he was going to lose consciousness. He sat down. He then passed out. He did not hit his head or harm himself after passing out. There was no confusion, headache, acute focal weakness, acute focal numbness, paresthesias, dysarthria, facial droop, vertigo, or dysphagia after he regained consciousness. He states that he was out for a few seconds. This episode was also not associated with  any tonic-clonic activity, tongue biting, or loss of bowel/bladder function. he reports a history of 1 prior syncopal event that occurred approximately 30 years ago while he lived in [place]. He was found to be orthostatic on that occasion.  He also reports one day of diffuse abdominal discomfort that was sharp, and located in the right upper quadrant and epigastrum. He is complaining of severe GERD and nausea.  MRCP was obtained and demonstrated acute pancreatitis, dilated pancreatic duct, and CBD. There is also a question of obstruction of gallbladder. LFT's were elevated on admissionGI was consulted. The patient has been evaluated by Dr. Allen Norris. He suggests that possibly patient has passed stone. He recommends that the patient be placed on clear liquid diet and monitor over the next day. If he does not improve the patient may need an ERCP. If he improves perhaps he can be discharged.  Dr. Allen Norris evaluated the patient. Today the patient's abdomen is more distended and pain is worse. LFT's are stable. Dr. Allen Norris has recommended CT abdomen and pelvis. CBC shows a greatly increased WBC count. Hemoglobin was stable. After stool softeners the patient had 4 BM's yesterday. His daughter is at bedside.  Consultants   . Gastroenterology  Procedures  . None  Antibiotics   Anti-infectives (From admission, onward)   None     Subjective  The patient is awake and alert. He is a little confused. He is very uncomfortable.  Objective   Vitals:  Vitals:   11/02/20 0742 11/02/20 1142  BP: (!) 143/99 139/88  Pulse: (!) 103 99  Resp: 16 16  Temp: (!) 97.5 F (36.4 C) 98 F (36.7 C)  SpO2: 97% 97%    Exam:  Constitutional:  . The patient is awake, alert, and oriented x 3. He is in moderate distress. Respiratory:  . No increased work of breathing. . No wheezes, rales, or rhonchi . No tactile fremitus Cardiovascular:  . Regular rate and rhythm . No murmurs, ectopy, or gallups. . No lateral PMI. No thrills. Abdomen:  . Abdomen is quite distended, The abdomen is diffusely tender. It is particularly tender in the right and left upper quadrants.  . No hernias, masses, or organomegaly . Hypoactive bowel sounds.  Musculoskeletal:  . No cyanosis, clubbing, or edema Skin:  . No rashes, lesions, ulcers . palpation of skin: no induration or nodules Neurologic:  . CN 2-12 intact . Sensation all 4 extremities intact Psychiatric:  . Mental status o Mood, affect appropriate o Orientation to person, place, time  . judgment and insight appear intact I have personally reviewed the following:   Today's Data  . Vitals, CMP, CBC  Micro Data  . Blood cultures x 2 obtained.  Imaging  . MRCP  Cardiology Data  . EKG  Scheduled Meds: . alum &  mag hydroxide-simeth  30 mL Oral Once  . docusate sodium  100 mg Oral BID  . iohexol  25 mL Oral Q1 Hr x 2  . levothyroxine  50 mcg Oral Q0600  . ondansetron (ZOFRAN) IV  4 mg Intravenous Q6H  . pantoprazole (PROTONIX) IV  40 mg Intravenous Q24H   Continuous Infusions: . lactated ringers 125 mL/hr at 11/01/20 1402    Principal Problem:   Syncope Active Problems:   Hypothyroidism   Gastroesophageal reflux disease without esophagitis    Abdominal pain   Dilation of pancreatic duct   Dehydration   Acute pancreatitis   LOS: 2 days   A & P   Syncope:Likely vasovagal complicated by hypovolemia due to poor PO intake for the past couple of days. The patient has received IV fluids. Orthostatics are still pending. Volume status will be carefully monitored. Echocardiogram is pending.   Acute pancreatitis with dilation of pancreatic duct on ERCP: Lipase 2209 on admission. T bili 1.3 on admission, down to 0.9 this morning. The patient states that he feels better.  CT abdomen/pelvis/MRCP with contrast demonstrates dilation of the pancreatic duct to 6 mm without overt obstructing stone/mass and without overt pancreatic wall thickening or peripancreatic fluid noted, while demonstrating no evidence of acute cholecystitis or choledocholithiasis. There is evidence of acute pancreatitis. Etiology is unclear. Patient is not a drinker. Triglycerides are normal. No history of pancreatitis in family or personally for this patient. He is on crestor at home which does have pancreatitis as an adverse reaction, although GI feels that it is likely related to the patient passing a stone recently and the associated inflammation. Will hold crestor as inpatient.  In the meantime we will treat conservatively with IV fluids, as needed IV analgesia, and allow for bowel rest.   The patient was kept NPO overnight in case he required ERCP. However, this morning the plan per Dr. Allen Norris is not for ERCP at this time. The patient is going for a repeat CT of his abdomen. He is still NPO pending the results of that procedure.  Dehydration: Diagnosis on the basis of physical exam evidence that includes dry oral mucous membrane, in addition to suggestion of dehydration stemming from urinalysis result, included elevated specific gravity of 1.046.  This appears to be on the basis of recent decline in oral intake of water over the last 2 to 3 days, as further noted above.   Orthostatics are pending. Continue IV fluids.   Plan: Continue IV fluids, as above.  Monitor strict I's and O's and daily weights.  Recommend management of presenting abdominal discomfort, as above.  Moniotr volume status and renal function.  GERD: Patient confirms a history of such, and acknowledges good compliance with his home daily Pepcid.  Noted continue Protonix. He reports that he previously underwent EGD approximately 10 to 12 years ago which showed no evidence of acute process, including no evidence of mass, esophagitis, gastritis, or peptic ulcer disease.   Acquired hypothyroidism: Continue synthroid as at home.  Osteoarthritis of the bilateral knees: On daily meloxicam in the absence of any additional NSAIDs at home. Holding NSAIDS at this time.   I have seen and examined this patient myself. I have spent 35 minutes in his evaluation and care. I have spent more than 50% of this in coordination of care with GI.  DVT prophylaxis: scd's  Code Status: Full code Family Communication: Case was discussed with the patient's daughter, who was present at bedside. Disposition Plan:  Status is: Inpatient  Remains inpatient appropriate because:Ongoing diagnostic testing needed not appropriate for outpatient work up  Dispo: The patient is from: Home              Anticipated d/c is to: Home              Patient currently is not medically stable to d/c.   Difficult to place patient No  Niyana Chesbro, DO [facility] Direct contact: see [email or web address]  7PM-7AM contact night coverage as above 11/02/2020, 1:26 PM  LOS: 1 day

## 2020-11-02 NOTE — TOC Initial Note (Signed)
Transition of Care [facility]) - Initial/Assessment Note    Patient Details  Name: Kevin Terrell MRN: [id number] Date of Birth: 1940/11/01  Transition of Care [facility]) CM/SW Contact:    Shelbie Hutching, RN Phone Number: 11/02/2020, 2:09 PM  Clinical Narrative:                 Patient admitted to the hospital with acute pancreatitis.  Patient will get an abdominal CT today.  RNCM met with patient and his daughter, Kevin Terrell, at the bedside.  Patient is from [facility].  Patient is independent at home and requires no assistive devices.  Patient is up to having home health or if necessary going to skilled rehab at discharge if needed.  TOC will cont to follow.   Expected Discharge Plan: [place] Barriers to Discharge: Continued Medical Work up   Patient Goals and CMS Choice Patient states their goals for this hospitalization and ongoing recovery are:: to get well CMS Medicare.gov Compare Post Acute Care list provided to:: Patient Choice offered to / list presented to : Patient  Expected Discharge Plan and Services Expected Discharge Plan: [place]   Discharge Planning Services: CM Consult   Living arrangements for the past 2 months: [place]                 DME Arranged: N/A DME Agency: NA                  Prior Living Arrangements/Services Living arrangements for the past 2 months: [place] Lives with:: Self Patient language and need for interpreter reviewed:: Yes Do you feel safe going back to the place where you live?: Yes      Need for Family Participation in Patient Care: Yes (Comment) Care giver support system in place?: Yes (comment) (daughter)   Criminal Activity/Legal Involvement Pertinent to Current Situation/Hospitalization: No - Comment as needed  Activities of Daily Living Home Assistive Devices/Equipment: Eyeglasses ADL Screening (condition at time of  admission) Patient's cognitive ability adequate to safely complete daily activities?: Yes Is the patient deaf or have difficulty hearing?: No Does the patient have difficulty seeing, even when wearing glasses/contacts?: No Does the patient have difficulty concentrating, remembering, or making decisions?: No Patient able to express need for assistance with ADLs?: Yes Does the patient have difficulty dressing or bathing?: No Independently performs ADLs?: Yes (appropriate for developmental age) Communication: Independent Dressing (OT): Independent Grooming: Independent Feeding: Independent Bathing: Independent Toileting: Independent In/Out Bed: Independent Walks in Home: Independent Does the patient have difficulty walking or climbing stairs?: No (But right now feeling weak) Weakness of Legs: Both Weakness of Arms/Hands: Both  Permission Sought/Granted Permission sought to share information with : Case Manager,Family Supports Permission granted to share information with : Yes, Verbal Permission Granted  Share Information with NAME: Kevin Terrell  Permission granted to share info w AGENCY: [place] granted to share info w Relationship: daughter     Emotional Assessment Appearance:: Appears stated age Attitude/Demeanor/Rapport: Engaged Affect (typically observed): Accepting Orientation: : Oriented to Self,Oriented to Place,Oriented to  Time,Oriented to Situation Alcohol / Substance Use: Not Applicable Psych Involvement: No (comment)  Admission diagnosis:  Syncope and collapse [R55] Dehydration [E86.0] Pancreatitis [K85.90] Syncope [R55] Scrotal pain [N50.82] Acute pancreatitis, unspecified complication status, unspecified pancreatitis type [K85.90] Patient Active Problem List   Diagnosis Date Noted  . Abdominal pain 11/01/2020  . Dilation of pancreatic duct 11/01/2020  . Dehydration  11/01/2020  . Acute pancreatitis   . Syncope 10/31/2020  . 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 Candidal balanitis 04/27/2020  . Aortic atherosclerosis ([place]) 02/02/2020  . Encounter for screening colonoscopy   . HLD (hyperlipidemia) 10/28/2019  . Screening for cardiovascular, respiratory, and genitourinary diseases 10/28/2019  . Vitamin D deficiency 08/24/2019  . Encounter to establish care 08/24/2019  . Osteoarthritis 01/26/2019  . Hx of cancer of lung 08/22/2018  . 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 Seasonal allergies 05/18/2018  . Hypothyroidism 05/18/2018  . Gastroesophageal reflux disease without esophagitis 05/18/2018  . 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 Chronic midline low back pain 05/18/2018  . Low serum vitamin B12 10/07/2016  . Pain in left knee 10/07/2016  . 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 Bone pain 07/07/2016   PCP:  [name], MD Pharmacy:   [facility], [address] [place] [address] Phone: [phone] Fax: [place], [address] [place] [address] Phone: [phone] Fax: [phone]     Social Determinants of Health (SDOH) Interventions    Readmission Risk Interventions No flowsheet data found.

## 2020-11-02 NOTE — Progress Notes (Signed)
Kevin Lame, MD [facility]   [address] [place], [place] [address] Phone: [phone] Fax : [phone]   Subjective: The patient reports that he has less pain today although he does report that his reflux is worse.  He also says that his abdomen is more distended.  The patient's lipase yesterday was over 2000 and this morning was 150.  He still complains of nausea and has tried to take sips of fluids.  Due to his report this morning of increased abdominal pain and distention I was contacted by the hospitalist and I recommended the patient undergo a repeat CT scan of the abdomen to make sure he does not have any disruption of his pancreatic duct or development of ascites with his report of a distended abdomen.  He reports that he has been passing gas more today than he was yesterday.  His daughter is in the room with him today and states that he is normally very sharp and does not leave out details and is a retired Animal nutritionist.  He now seems to have trouble remembering small things that she states she has noticed but others might not notice.   Objective: Vital signs in last 24 hours: Vitals:   11/02/20 0428 11/02/20 0500 11/02/20 0742 11/02/20 1142  BP: (!) 133/92  (!) 143/99 139/88  Pulse: 97  (!) 103 99  Resp: 20  16 16   Temp: (!) 97.5 F (36.4 C)  (!) 97.5 F (36.4 C) 98 F (36.7 C)  TempSrc: Oral  Oral Oral  SpO2: 93%  97% 97%  Weight:  86.2 kg    Height:       Weight change: 4.552 kg  Intake/Output Summary (Last 24 hours) at 11/02/2020 1252 Last data filed at 11/01/2020 1402 Gross per 24 hour  Intake 151.79 ml  Output --  Net 151.79 ml     Exam: Heart:: Regular rate and rhythm, S1S2 present or without murmur or extra heart sounds Lungs: normal and clear to auscultation and percussion Abdomen: Soft positive tenderness diffusely without any rebound or guarding   Lab Results: @LABTEST2 @ Micro Results: Recent Results (from the past 240 hour(s))  SARS CORONAVIRUS  2 (TAT 6-24 HRS) Nasopharyngeal Nasopharyngeal Swab     Status: None   Collection Time: 10/31/20 10:00 PM   Specimen: Nasopharyngeal Swab  Result Value Ref Range Status   SARS Coronavirus 2 NEGATIVE NEGATIVE Final    Comment: (NOTE) SARS-CoV-2 target nucleic acids are NOT DETECTED.  The SARS-CoV-2 RNA is generally detectable in upper and lower respiratory specimens during the acute phase of infection. Negative results do not preclude SARS-CoV-2 infection, do not rule out co-infections with other pathogens, and should not be used as the sole basis for treatment or other patient management decisions. Negative results must be combined with clinical observations, patient history, and epidemiological information. The expected result is Negative.  Fact Sheet for Patients: SugarRoll.be  Fact Sheet for Healthcare Providers: https://www.woods-mathews.com/  This test is not yet approved or cleared by the [place] FDA and  has been authorized for detection and/or diagnosis of SARS-CoV-2 by FDA under an Emergency Use Authorization (EUA). This EUA will remain  in effect (meaning this test can be used) for the duration of the COVID-19 declaration under Se ction 564(b)(1) of the Act, 21 U.S.C. section 360bbb-3(b)(1), unless the authorization is terminated or revoked sooner.  Performed at [facility], [place] [address], [place], [place] [address]   CULTURE, BLOOD (ROUTINE X 2) w Reflex to  ID Panel     Status: None (Preliminary result)   Collection Time: 11/01/20 10:56 AM   Specimen: BLOOD  Result Value Ref Range Status   Specimen Description BLOOD LEFT ANTECUBITAL  Final   Special Requests   Final    BOTTLES DRAWN AEROBIC AND ANAEROBIC Blood Culture results may not be optimal due to an excessive volume of blood received in culture bottles   Culture   Final    NO GROWTH < 24 HOURS Performed at [facility], [address]  [place], [place] [address]    Report Status PENDING  Incomplete  CULTURE, BLOOD (ROUTINE X 2) w Reflex to ID Panel     Status: None (Preliminary result)   Collection Time: 11/01/20 12:38 PM   Specimen: BLOOD  Result Value Ref Range Status   Specimen Description BLOOD RIGHT ANTECUBITAL  Final   Special Requests   Final    BOTTLES DRAWN AEROBIC AND ANAEROBIC Blood Culture adequate volume   Culture   Final    NO GROWTH < 24 HOURS Performed at [facility], [address] [place], [place] [address]    Report Status PENDING  Incomplete   Studies/Results: DG Chest 2 View  Result Date: 10/31/2020 CLINICAL DATA:  Syncope EXAM: CHEST - 2 VIEW COMPARISON:  None. FINDINGS: Heart size and mediastinal contours are within normal limits. Lungs are clear. No pleural effusion or pneumothorax is seen. No acute appearing osseous abnormality. IMPRESSION: No active cardiopulmonary disease. Electronically Signed   By: Franki Cabot M.D.   On: 10/31/2020 18:30   CT ABDOMEN PELVIS W CONTRAST  Result Date: 10/31/2020 CLINICAL DATA:  Abdominal pain. EXAM: CT ABDOMEN AND PELVIS WITH CONTRAST TECHNIQUE: Multidetector CT imaging of the abdomen and pelvis was performed using the standard protocol following bolus administration of intravenous contrast. CONTRAST:  150mL OMNIPAQUE IOHEXOL 300 MG/ML  SOLN COMPARISON:  None. FINDINGS: Lower chest: No acute abnormality. Hepatobiliary: 2 cm hypodense lesion within the posterior-superior RIGHT liver lobe, indeterminate but most suggestive of a benign hemangioma. Gallbladder appears normal. No bile duct dilatation is seen. Pancreas: Pancreatic duct is dilated to approximately 6 mm. No obstructing mass or stone is identified within the pancreas. Spleen: Normal in size without focal abnormality. Adrenals/Urinary Tract: Adrenal glands appear normal. Kidneys are unremarkable without mass, stone or hydronephrosis. No ureteral or bladder calculi are identified. Stomach/Bowel: No  dilated large or small bowel loops are seen. No evidence of bowel wall inflammation. Appendix is normal. Vascular/Lymphatic: Aortic atherosclerosis. Reproductive: Prostate gland is mildly prominent in size. Other: There is an ill-defined/hazy density throughout the periaortic retroperitoneum with envelopment of both the aorta and portions of the inferior vena cava and with extension anteriorly about the superior mesenteric vessels (and proximal branches), and with scattered mildly prominent lymph nodes. This is new compared to earlier chest CT of 11/28/2019. Walls of the adjacent abdominal aorta are unremarkable, without obvious wall thickening or wall enhancement to suggest aortitis. Musculoskeletal: No acute appearing osseous abnormality. Lucency within the L5 vertebral body is likely related to overlying degenerative disc disease. IMPRESSION: 1. 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 This is new compared to earlier chest CT of 11/28/2019. Differential considerations include but are not limited to rapidly developing retroperitoneal fibrosis and neoplastic process such as lymphoma. Aortitis is considered much  less likely due to the distribution and the lack of aortic wall thickening. Consider further characterization with PET-CT. Surgical exploration with biopsy may eventually be needed for definitive characterization. 2. Pancreatic duct is dilated to approximately 6 mm. No obstructing mass or stone is identified within the pancreas. No fluid about the pancreas itself to suggest acute pancreatitis. Recommend further characterization with nonemergent pancreatic MRI. 3. 2 cm hypodense lesion within the posterior-superior RIGHT liver lobe, indeterminate but  most suggestive of a benign hemangioma. Consider confirmation with liver MRI. Aortic Atherosclerosis (ICD10-I70.0). Electronically Signed   By: Franki Cabot M.D.   On: 10/31/2020 19:15   MR 3D Recon At Scanner  Result Date: 11/01/2020 CLINICAL DATA:  Pancreatic ductal dilatation. EXAM: MRI ABDOMEN WITHOUT AND WITH CONTRAST (INCLUDING MRCP) TECHNIQUE: Multiplanar multisequence MR imaging of the abdomen was performed both before and after the administration of intravenous contrast. Heavily T2-weighted images of the biliary and pancreatic ducts were obtained, and three-dimensional MRCP images were rendered by post processing. CONTRAST:  54mL GADAVIST GADOBUTROL 1 MMOL/ML IV SOLN COMPARISON:  CT scan 10/31/2020. FINDINGS: Lower chest: Unremarkable. Hepatobiliary: 2.3 cm lesion noted posterior right liver showing some peripheral nodular enhancement after IV contrast administration. No substantial filling in on more delayed imaging. This lesion was present on a chest CT from 11/28/2019 and is stable in size in the interval. 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 1.4 cm simple cyst noted in the dome of the left liver several other scattered tiny T2 hyperintensities are too small to characterize. There is no evidence for gallstones, gallbladder wall thickening, or pericholecystic fluid. Mild intrahepatic biliary duct dilatation noted with focal narrowing of the common duct in the hepatoduodenal ligament (see coronal thin MRCP image 12 of series 14 and postcontrast coronal image 43 of series 23. This finding is also demonstrated on 3D MRCP image 3 of series 10/21 and image 1 of series 1032. No discernible obstructing stone or mass lesion evident. Heterogeneous perfusion of liver parenchyma evident on arterial phase imaging. Pancreas: Diffuse edema in and around the pancreas appears progressive since CT scan 1 day earlier. Pancreatic duct is less dilated measuring 3 mm diameter in the body and 4 mm diameter  in the head of pancreas. No discrete pancreatic mass lesion evident. Spleen:  No splenomegaly. No focal mass lesion. Adrenals/Urinary Tract: No adrenal nodule or mass. No gross suspicious abnormality in either kidney. Stomach/Bowel: Stomach is markedly distended with food and fluid. Duodenum is nondistended with duodenal diverticulum evident. No small bowel or colonic dilatation within the visualized abdomen. Vascular/Lymphatic: No abdominal aortic aneurysm no substantial wall thickening or mural enhancement in the aorta. Variant anatomy with common trunk for the celiac axis and SMA noted. Other: As seen on previous CT, there is diffuse retroperitoneal edema, progressive in the interval with fluid now visible in the para colic gutters bilaterally Musculoskeletal: No focal suspicious marrow enhancement within the visualized bony anatomy. IMPRESSION: 1. Mild intrahepatic biliary duct dilatation with focal narrowing of the common duct in the hepatoduodenal ligament. No discernible obstructing stone or mass lesion evident. ERCP may be warranted to further evaluate. 2. No gallstones or evidence of choledocholithiasis. 3. Diffuse edema in and around the pancreas appears progressive since CT scan 1 day earlier. Pancreatic duct is less dilated. No discrete mass lesion evident. Imaging appearance suggests pancreatitis. 4. Interval progression of retroperitoneal fluid with fluid now visible in the para colic gutters. As on CT yesterday, there is retroperitoneal edema around the aorta but no substantial aortic  wall thickening to suggest aortitis/vasculitis. 5. 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 Smaller cyst noted left hepatic lobe with additional tiny T2 hyperintense liver lesions too small to characterize. 6. Marked distention of the stomach with food and fluid. No duodenal dilatation. No obstructing mass lesion, but component of gastric outlet obstruction not excluded. Electronically  Signed   By: Misty Stanley M.D.   On: 11/01/2020 07:54   MR ABDOMEN MRCP W WO CONTAST  Result Date: 11/01/2020 CLINICAL DATA:  Pancreatic ductal dilatation. EXAM: MRI ABDOMEN WITHOUT AND WITH CONTRAST (INCLUDING MRCP) TECHNIQUE: Multiplanar multisequence MR imaging of the abdomen was performed both before and after the administration of intravenous contrast. Heavily T2-weighted images of the biliary and pancreatic ducts were obtained, and three-dimensional MRCP images were rendered by post processing. CONTRAST:  80mL GADAVIST GADOBUTROL 1 MMOL/ML IV SOLN COMPARISON:  CT scan 10/31/2020. FINDINGS: Lower chest: Unremarkable. Hepatobiliary: 2.3 cm lesion noted posterior right liver showing some peripheral nodular enhancement after IV contrast administration. No substantial filling in on more delayed imaging. This lesion was present on a chest CT from 11/28/2019 and is stable in size in the interval. 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 1.4 cm simple cyst noted in the dome of the left liver several other scattered tiny T2 hyperintensities are too small to characterize. There is no evidence for gallstones, gallbladder wall thickening, or pericholecystic fluid. Mild intrahepatic biliary duct dilatation noted with focal narrowing of the common duct in the hepatoduodenal ligament (see coronal thin MRCP image 12 of series 14 and postcontrast coronal image 43 of series 23. This finding is also demonstrated on 3D MRCP image 3 of series 10/21 and image 1 of series 1032. No discernible obstructing stone or mass lesion evident. Heterogeneous perfusion of liver parenchyma evident on arterial phase imaging. Pancreas: Diffuse edema in and around the pancreas appears progressive since CT scan 1 day earlier. Pancreatic duct is less dilated measuring 3 mm diameter in the body and 4 mm diameter in the head of pancreas. No discrete pancreatic mass lesion evident. Spleen:  No splenomegaly. No focal mass lesion.  Adrenals/Urinary Tract: No adrenal nodule or mass. No gross suspicious abnormality in either kidney. Stomach/Bowel: Stomach is markedly distended with food and fluid. Duodenum is nondistended with duodenal diverticulum evident. No small bowel or colonic dilatation within the visualized abdomen. Vascular/Lymphatic: No abdominal aortic aneurysm no substantial wall thickening or mural enhancement in the aorta. Variant anatomy with common trunk for the celiac axis and SMA noted. Other: As seen on previous CT, there is diffuse retroperitoneal edema, progressive in the interval with fluid now visible in the para colic gutters bilaterally Musculoskeletal: No focal suspicious marrow enhancement within the visualized bony anatomy. IMPRESSION: 1. Mild intrahepatic biliary duct dilatation with focal narrowing of the common duct in the hepatoduodenal ligament. No discernible obstructing stone or mass lesion evident. ERCP may be warranted to further evaluate. 2. No gallstones or evidence of choledocholithiasis. 3. Diffuse edema in and around the pancreas appears progressive since CT scan 1 day earlier. Pancreatic duct is less dilated. No discrete mass lesion evident. Imaging appearance suggests pancreatitis. 4. Interval progression of retroperitoneal fluid with fluid now visible in the para colic gutters. As on CT yesterday, there is retroperitoneal edema around the aorta but no substantial aortic wall thickening to suggest aortitis/vasculitis. 5. 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 Smaller cyst noted left hepatic lobe with additional tiny T2 hyperintense liver lesions too small to characterize.  6. Marked distention of the stomach with food and fluid. No duodenal dilatation. No obstructing mass lesion, but component of gastric outlet obstruction not excluded. Electronically Signed   By: Misty Stanley M.D.   On: 11/01/2020 07:54   US SCROTUM W/DOPPLER  Result Date: 10/31/2020 CLINICAL  DATA:  Bilateral scrotal pain EXAM: SCROTAL ULTRASOUND DOPPLER ULTRASOUND OF THE TESTICLES TECHNIQUE: Complete ultrasound examination of the testicles, epididymis, and other scrotal structures was performed. Color and spectral Doppler ultrasound were also utilized to evaluate blood flow to the testicles. COMPARISON:  None. FINDINGS: Right testicle Measurements: 3.7 x 2.4 x 3.2 cm. No mass or microlithiasis visualized. Left testicle Measurements: 4.0 x 2.5 x 2.8 cm. No mass or microlithiasis visualized. Right epididymis: Normal in size. Slightly heterogeneous in appearance. Small scattered calcifications are seen. Left epididymis: Normal in size. Slightly heterogeneous in appearance. Hydrocele:  None visualized. Varicocele:  None visualized. Pulsed Doppler interrogation of both testes demonstrates normal low resistance arterial and venous waveforms bilaterally. IMPRESSION: Slightly heterogeneous bilateral epididymi, however no definite evidence of epididymitis. Normal appearing testicles. Electronically Signed   By: Prudencio Pair M.D.   On: 10/31/2020 23:07   Medications: I have reviewed the patient's current medications. Scheduled Meds: . alum & mag hydroxide-simeth  30 mL Oral Once  . docusate sodium  100 mg Oral BID  . iohexol  25 mL Oral Q1 Hr x 2  . levothyroxine  50 mcg Oral Q0600  . ondansetron (ZOFRAN) IV  4 mg Intravenous Q6H  . pantoprazole (PROTONIX) IV  40 mg Intravenous Q24H   Continuous Infusions: . lactated ringers 125 mL/hr at 11/01/20 1402   PRN Meds:.HYDROmorphone (DILAUDID) injection, LORazepam, metoCLOPramide (REGLAN) injection, ondansetron (ZOFRAN) IV   Assessment: Principal Problem:   Syncope Active Problems:   Hypothyroidism   Gastroesophageal reflux disease without esophagitis   Abdominal pain   Dilation of pancreatic duct   Dehydration   Acute pancreatitis    Plan: The patient was reported to have increased pain after his admission for pancreatitis.  His lipase  level has decreased significantly but he still is nauseated.  Due to his report of increased pain I have asked the staff to get him another CT scan of the abdomen to make sure that a complication such as pancreatic necrosis or disruption of the pancreatic duct or even worsening of the pancreatitis has occurred since admission.  The patient and his daughter have been explained the plan and agree with it.   LOS: 2 days   Lewayne Bunting 11/02/2020, 12:52 PM Pager [phone] 7am-5pm  Check AMION for 5pm -7am coverage and on weekends

## 2020-11-03 ENCOUNTER — Inpatient Hospital Stay (HOSPITAL_COMMUNITY)
Admit: 2020-11-03 | Discharge: 2020-11-03 | Disposition: A | Discharge status: Home / Self Care | Payer: Medicare Other | Attending: Internal Medicine | Admitting: Internal Medicine

## 2020-11-03 DIAGNOSIS — R55 Syncope and collapse: Secondary | ICD-10-CM

## 2020-11-03 DIAGNOSIS — I340 Nonrheumatic mitral (valve) insufficiency: Secondary | ICD-10-CM | POA: Diagnosis not present

## 2020-11-03 DIAGNOSIS — R109 Unspecified abdominal pain: Secondary | ICD-10-CM

## 2020-11-03 DIAGNOSIS — I361 Nonrheumatic tricuspid (valve) insufficiency: Secondary | ICD-10-CM | POA: Diagnosis not present

## 2020-11-03 DIAGNOSIS — K859 Acute pancreatitis without necrosis or infection, unspecified: Secondary | ICD-10-CM | POA: Diagnosis not present

## 2020-11-03 LAB — CBC WITH DIFFERENTIAL/PLATELET
Abs Immature Granulocytes: 0.12 10*3/uL — ABNORMAL HIGH (ref 0.00–0.07)
Basophils Absolute: 0 10*3/uL (ref 0.0–0.1)
Basophils Relative: 0 %
Eosinophils Absolute: 0 10*3/uL (ref 0.0–0.5)
Eosinophils Relative: 0 %
HCT: 42 % (ref 39.0–52.0)
Hemoglobin: 14.2 g/dL (ref 13.0–17.0)
Immature Granulocytes: 1 %
Lymphocytes Relative: 6 %
Lymphs Abs: 1.1 10*3/uL (ref 0.7–4.0)
MCH: 30.9 pg (ref 26.0–34.0)
MCHC: 33.8 g/dL (ref 30.0–36.0)
MCV: 91.3 fL (ref 80.0–100.0)
Monocytes Absolute: 2.1 10*3/uL — ABNORMAL HIGH (ref 0.1–1.0)
Monocytes Relative: 10 %
Neutro Abs: 16.6 10*3/uL — ABNORMAL HIGH (ref 1.7–7.7)
Neutrophils Relative %: 83 %
Platelets: 145 10*3/uL — ABNORMAL LOW (ref 150–400)
RBC: 4.6 MIL/uL (ref 4.22–5.81)
RDW: 12.2 % (ref 11.5–15.5)
WBC: 20 10*3/uL — ABNORMAL HIGH (ref 4.0–10.5)
nRBC: 0 % (ref 0.0–0.2)

## 2020-11-03 LAB — COMPREHENSIVE METABOLIC PANEL
ALT: 11 U/L (ref 0–44)
AST: 21 U/L (ref 15–41)
Albumin: 2.8 g/dL — ABNORMAL LOW (ref 3.5–5.0)
Alkaline Phosphatase: 64 U/L (ref 38–126)
Anion gap: 12 (ref 5–15)
BUN: 29 mg/dL — ABNORMAL HIGH (ref 8–23)
CO2: 21 mmol/L — ABNORMAL LOW (ref 22–32)
Calcium: 7.5 mg/dL — ABNORMAL LOW (ref 8.9–10.3)
Chloride: 96 mmol/L — ABNORMAL LOW (ref 98–111)
Creatinine, Ser: 0.9 mg/dL (ref 0.61–1.24)
GFR, Estimated: >60 mL/min (ref >60)
Glucose, Bld: 91 mg/dL (ref 70–99)
Potassium: 4.1 mmol/L (ref 3.5–5.1)
Sodium: 129 mmol/L — ABNORMAL LOW (ref 135–145)
Total Bilirubin: 0.8 mg/dL (ref 0.3–1.2)
Total Protein: 5.6 g/dL — ABNORMAL LOW (ref 6.5–8.1)

## 2020-11-03 LAB — ECHOCARDIOGRAM COMPLETE
AR max vel: 3.15 cm2
AV Peak grad: 8.4 mmHg
Ao pk vel: 1.45 m/s
Area-P 1/2: 3.42 cm2
Height: 73 in
S' Lateral: 3.22 cm
Weight: 3040.58 oz

## 2020-11-03 MED ORDER — HYDROCODONE-ACETAMINOPHEN 5-325 MG PO TABS
1.0000 | ORAL_TABLET | Freq: Four times a day (QID) | ORAL | Status: DC | PRN
  Administered 2020-11-04: 1 via ORAL
  Administered 2020-11-05 (×2): 2 via ORAL
  Filled 2020-11-03 (×2): qty 2
  Filled 2020-11-03: qty 1

## 2020-11-03 MED ORDER — ACETAMINOPHEN 325 MG PO TABS
650.0000 mg | ORAL_TABLET | Freq: Four times a day (QID) | ORAL | Status: DC | PRN
  Administered 2020-11-06 – 2020-11-07 (×3): 650 mg via ORAL
  Filled 2020-11-03 (×3): qty 2

## 2020-11-03 MED ORDER — ACETAMINOPHEN 325 MG PO TABS
ORAL_TABLET | ORAL | Status: AC
  Administered 2020-11-03: 650 mg via ORAL
  Filled 2020-11-03: qty 2

## 2020-11-03 NOTE — Progress Notes (Signed)
Vonda Antigua, MD [address] [place], [place], [place], [address] [address], [place], [place], [place], [address] Phone: [phone]  Fax: [phone]   Subjective: Patient tolerating oral diet well.  States symptoms are much better than yesterday.  No further nausea.  States abdominal discomfort and pain have significantly improved   Objective: Exam: Vital signs in last 24 hours: Vitals:   11/02/20 0742 11/02/20 1142 11/02/20 2044 11/03/20 0801  BP: (!) 143/99 139/88 (!) 140/92 (!) 146/69  Pulse: (!) 103 99 93 89  Resp: 16 16 16 20   Temp: (!) 97.5 F (36.4 C) 98 F (36.7 C) 98.9 F (37.2 C) 98.6 F (37 C)  TempSrc: Oral Oral    SpO2: 97% 97% 97% 97%  Weight:      Height:       Weight change:  No intake or output data in the 24 hours ending 11/03/20 1011  General: No acute distress, AAO x3 Abd: Soft, NT/ND, No HSM Skin: Warm, no rashes Neck: Supple, Trachea midline   Lab Results: Lab Results  Component Value Date   WBC 20.0 (H) 11/03/2020   HGB 14.2 11/03/2020   HCT 42.0 11/03/2020   MCV 91.3 11/03/2020   PLT 145 (L) 11/03/2020   Micro Results: Recent Results (from the past 240 hour(s))  SARS CORONAVIRUS 2 (TAT 6-24 HRS) Nasopharyngeal Nasopharyngeal Swab     Status: None   Collection Time: 10/31/20 10:00 PM   Specimen: Nasopharyngeal Swab  Result Value Ref Range Status   SARS Coronavirus 2 NEGATIVE NEGATIVE Final    Comment: (NOTE) SARS-CoV-2 target nucleic acids are NOT DETECTED.  The SARS-CoV-2 RNA is generally detectable in upper and lower respiratory specimens during the acute phase of infection. Negative results do not preclude SARS-CoV-2 infection, do not rule out co-infections with other pathogens, and should not be used as the sole basis for treatment or other patient management decisions. Negative results must be combined with clinical observations, patient history, and epidemiological information. The expected result is  Negative.  Fact Sheet for Patients: SugarRoll.be  Fact Sheet for Healthcare Providers: https://www.woods-mathews.com/  This test is not yet approved or cleared by the [place] FDA and  has been authorized for detection and/or diagnosis of SARS-CoV-2 by FDA under an Emergency Use Authorization (EUA). This EUA will remain  in effect (meaning this test can be used) for the duration of the COVID-19 declaration under Se ction 564(b)(1) of the Act, 21 U.S.C. section 360bbb-3(b)(1), unless the authorization is terminated or revoked sooner.  Performed at [facility], [place] [address], [place], [place] [address]   CULTURE, BLOOD (ROUTINE X 2) w Reflex to ID Panel     Status: None (Preliminary result)   Collection Time: 11/01/20 10:56 AM   Specimen: BLOOD  Result Value Ref Range Status   Specimen Description BLOOD LEFT ANTECUBITAL  Final   Special Requests   Final    BOTTLES DRAWN AEROBIC AND ANAEROBIC Blood Culture results may not be optimal due to an excessive volume of blood received in culture bottles   Culture   Final    NO GROWTH 2 DAYS Performed at [facility], [address] [place], [place] [address]    Report Status PENDING  Incomplete  CULTURE, BLOOD (ROUTINE X 2) w Reflex to ID Panel     Status: None (Preliminary result)   Collection Time: 11/01/20 12:38 PM   Specimen: BLOOD  Result Value Ref Range Status   Specimen Description  BLOOD RIGHT ANTECUBITAL  Final   Special Requests   Final    BOTTLES DRAWN AEROBIC AND ANAEROBIC Blood Culture adequate volume   Culture   Final    NO GROWTH 2 DAYS Performed at [facility], [address] [place], [place] [address]    Report Status PENDING  Incomplete   Studies/Results: CT ABDOMEN PELVIS W CONTRAST  Result Date: 11/02/2020 CLINICAL DATA:  Diffuse abdominal discomfort that was sharp, and located in the right upper quadrant and epigastrum. He is complaining  of severe GERD and nausea. Pancreatitis, acute, severe EXAM: CT ABDOMEN AND PELVIS WITH CONTRAST TECHNIQUE: Multidetector CT imaging of the abdomen and pelvis was performed using the standard protocol following bolus administration of intravenous contrast. CONTRAST:  <See Chart> OMNIPAQUE IOHEXOL 240 MG/ML SOLN, 159mL OMNIPAQUE IOHEXOL 300 MG/ML SOLN COMPARISON:  MRI abdomen 11/01/2020, CT abdomen pelvis 10/31/2020 FINDINGS: Lower chest: Interval development of bilateral trace volume pleural effusions with associated passive atelectasis bilateral lower lobes. Coronary artery calcifications. Hepatobiliary: Redemonstration of a stable 2.1 cm hypodensity within the right hepatic lobe (2:11) that is consistent with a known hepatic hemangioma better evaluated on MRI abdomen 11/01/2020. Subcentimeter hypodensities are again noted and too small to characterize. No focal liver abnormality. Layering high density within the gallbladder lumen likely represent vicarious excretion of previously administered intravenous contrast. No gallstones, gallbladder wall thickening, or pericholecystic fluid. No biliary dilatation. Pancreas: Hazy pancreatic contour with interval increase in peripancreatic fat stranding and free fluid. No pseudocyst formation. Focal borderline dilatation of the main pancreatic duct in the proximal pancreas is poorly visualized but still present. Finding better evaluated on MRI abdomen 11/01/2020. No diffuse main pancreatic duct dilatation. Spleen: Normal in size without focal abnormality. Adrenals/Urinary Tract: No adrenal nodule bilaterally. Bilateral kidneys enhance symmetrically. No hydronephrosis. No hydroureter. The urinary bladder is unremarkable. On delayed imaging, there is no urothelial wall thickening and there are no filling defects in the opacified portions of the bilateral collecting systems or ureters. Stomach/Bowel: Stomach is within normal limits. No evidence of bowel wall thickening or  dilatation. Appendix appears normal. Vascular/Lymphatic: The portal, splenic, superior mesenteric veins are patent. Limited evaluation of the splenic artery on this portal venous single phase contrast study. No abdominal aorta or iliac aneurysm. Severe atherosclerotic plaque of the aorta and its branches. Redemonstration of ill-defined hazy density throughout the retroperitoneum along the aorta andinferior vena cava with extension anteriorly to the mesenteric vessels suggestive of possibly lymphoproliferative disorder. Reproductive: The prostate is enlarged measuring up to 5.1 cm. Other: Interval increase in moderate volume simple fluid ascites. No intraperitoneal free gas. No organized fluid collection. Musculoskeletal: No abdominal wall hernia or abnormality. No suspicious lytic or blastic osseous lesions. No acute displaced fracture. Multilevel degenerative changes of the spine. IMPRESSION: 1. Acute pancreatitis with interval increase in moderate volume simple free fluid ascites. No pseudocyst formation. 2. 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 Recommend PET-CT for further evaluation. 3. Sigmoid diverticulosis with no acute diverticulitis. 4.  Aortic Atherosclerosis (ICD10-I70.0). Electronically Signed   By: Iven Finn M.D.   On: 11/02/2020 16:49   Medications:  Scheduled Meds: . docusate sodium  100 mg Oral BID  . levothyroxine  50 mcg Oral Q0600  . ondansetron (ZOFRAN) IV  4 mg Intravenous Q6H  . pantoprazole (PROTONIX) IV  40 mg Intravenous Q24H   Continuous Infusions: . lactated  ringers 125 mL/hr at 11/02/20 2315   PRN Meds:.HYDROmorphone (DILAUDID) injection, LORazepam, metoCLOPramide (REGLAN) injection, ondansetron (ZOFRAN) IV   Assessment: Principal Problem:   Syncope Active  Problems:   Hypothyroidism   Gastroesophageal reflux disease without esophagitis   Abdominal pain   Dilation of pancreatic duct   Dehydration   Acute pancreatitis    Plan: Given his presenting symptoms, when patient was evaluated initially by Dr. Allen Norris, the etiology of his pancreatitis was considered to be a passed stone based on his initially elevated bilirubin that normalized  Given the above, would recommend surgery consult to evaluate for cholecystectomy to prevent future episodes of gallstone pancreatitis  I reviewed his medication list, and of the medications listed, Crestor and triamcinolone, a class IV drugs for causing pancreatitis.  Class I drugs are more likely to cause pancreatitis compared to class IV.  Therefore, proving causation of these medications do have causes pancreatitis is not clear.  Primary team is holding Crestor which is reasonable.  Triamcinolone is on his medication list but it is documented that patient has not been taking this  We will also add IgG4 levels to evaluate for autoimmune pancreatitis Triglyceride levels not high enough to have caused the symptoms  Would also recommend further evaluation of ill-defined hazy density noted in the retroperitoneum as patient may need biopsies of this  With patient's elevated white count, would recommend ruling out any infectious etiology.  4 bowel movements were noted yesterday in primary team's note after stool softeners.  If patient has diarrhea today, rule out any infectious etiology  Monitor white count and rule out any other infectious etiology such as UTI or pneumonia with chest x-ray as necessary  Patient has remained afebrile  Continue to advance diet as tolerated to low-fat diet    LOS: 3 days   Vonda Antigua, MD 11/03/2020, 10:11 AM

## 2020-11-03 NOTE — Plan of Care (Signed)
Patient progressed from clear to thin liquids. Managed pain using only tylenol. Patient was mobile today, up to bathroom with minimal assistance needed.

## 2020-11-03 NOTE — H&P (View-Only) (Signed)
Subjective:   CC: gallstone pancreatitis  HPI:  Kevin Terrell is a 80 y.o. male who is consulted by [facility] for evaluation of above cc.  Symptoms were first noted a few days ago. Pain is abdominal, started shortly before syncopal episode.  Associated with nothing specific, exacerbated by nothing specific.  Workup revealed pancreatitis, likely gallstone in origin, due to initially elevated bili with no other obvious causes.  Currently feeling better.     Past Medical History:  has a past medical history of Actinic keratosis, Arthritis, Chronic allergic rhinitis due to pollen, GERD (gastroesophageal reflux disease), Hypothyroidism, Lung cancer ([place]) (2009), and Seborrheic dermatitis.  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  Family History: family history includes Alzheimer's disease in his father and mother; Cancer in his mother; Hypertension in his father; Stroke in his maternal grandmother.  Social History:  reports that he has never smoked. He has never used smokeless tobacco. He reports current alcohol use. He reports that he does not use drugs.  Current Medications:  Prior to Admission medications   Medication Sig Start Date End Date Taking? Authorizing Provider  cetirizine (ZYRTEC) 10 MG tablet Take 10 mg by mouth daily.   Yes [provider]  cholecalciferol (VITAMIN D3) 25 MCG (1000 UT) tablet Take 5,000 Units by mouth daily.   Yes [provider]  famotidine (PEPCID) 20 MG tablet Take 1 tablet (20 mg total) by mouth at bedtime. 06/01/20  Yes [name], MD  fluticasone (FLONASE) 50 MCG/ACT nasal spray Place 1 spray into both nostrils 2 (two) times daily.   Yes [provider]  gabapentin (NEURONTIN) 300 MG capsule TAKE 1 CAPSULE BY MOUTH TWICE  DAILY 02/13/20  Yes Burnard Hawthorne, FNP  levothyroxine (SYNTHROID) 50 MCG tablet TAKE 1 TABLET BY MOUTH ONCE A DAY BEFOREBREAKFAST. 10/30/20  Yes [name], MD  meloxicam (MOBIC) 15 MG tablet TAKE ONE TABLET BY MOUTH DAILY 07/09/20  Yes Burnard Hawthorne, FNP  montelukast (SINGULAIR) 10 MG tablet TAKE 1 TABLET BY MOUTH EVERY NIGHT AT BEDTIME 04/03/20  Yes Burnard Hawthorne, FNP  Multiple Vitamin (MULTIVITAMIN WITH MINERALS) TABS tablet Take 1 tablet by mouth daily.   Yes [provider]  Probiotic Product (PROBIOTIC PO) Take 1 capsule by mouth 2 (two) times daily.   Yes [provider]  rosuvastatin (CRESTOR) 10 MG tablet Take 1 tablet (10 mg total) by mouth daily. 10/28/19  Yes Burnard Hawthorne, FNP  ketoconazole (NIZORAL) 2 % cream Apply  a small amount to affected area twice a day Patient not taking: No sig reported 08/22/19   [provider]  nystatin-triamcinolone ointment (MYCOLOG) Apply 1 application topically 2 (two) times daily. Patient not taking: No sig reported 06/28/20   Billey Co, MD  Olopatadine HCl 0.2 % SOLN as needed.  Patient not taking: No sig reported    [provider]  tacrolimus (PROTOPIC) 0.1 % ointment Apply topically 2 (two) times daily. 07/04/20   Moye, [place], MD  tacrolimus (PROTOPIC) 0.1 % ointment Apply topically in the morning and at bedtime. Patient not taking: No sig reported 07/24/20   Moye, [place], MD  triamcinolone ointment (KENALOG) 0.1 % APPLY TO HANDS AT BEDTIME AND COVER WITHGLOVES AS NEEDED FOR FLARES. AVOID FACE/GROIN/AXILLA. Patient not taking: No sig reported 06/11/20   [place], [place],  MD    Allergies:  Allergies as of 10/31/2020 - Review Complete 10/31/2020  Allergen Reaction Noted  . Compazine [prochlorperazine edisylate]  05/17/2018  . Other  05/17/2018    ROS:  General: Denies weight loss, weight gain, fatigue, fevers, chills, and night sweats. Eyes: Denies blurry vision, double  vision, eye pain, itchy eyes, and tearing. Ears: Denies hearing loss, earache, and ringing in ears. Nose: Denies sinus pain, congestion, infections, runny nose, and nosebleeds. Mouth/throat: Denies hoarseness, sore throat, bleeding gums, and difficulty swallowing. Heart: Denies chest pain, palpitations, racing heart, irregular heartbeat, leg pain or swelling, and decreased activity tolerance. Respiratory: Denies breathing difficulty, shortness of breath, wheezing, cough, and sputum. GI: Denies change in appetite, heartburn, nausea, vomiting, constipation, diarrhea, and blood in stool. GU: Denies difficulty urinating, pain with urinating, urgency, frequency, blood in urine. Musculoskeletal: Denies joint stiffness, pain, swelling, muscle weakness. Skin: Denies rash, itching, mass, tumors, sores, and boils Neurologic: Denies headache, fainting, dizziness, seizures, numbness, and tingling. Psychiatric: Denies depression, anxiety, difficulty sleeping, and memory loss. Endocrine: Denies heat or cold intolerance, and increased thirst or urination. Blood/lymph: Denies easy bruising, and swollen glands     Objective:     BP 140/72 (BP Location: Left Arm)   Pulse 84   Temp 98.1 F (36.7 C)   Resp 20   Ht 6\' 1"  (1.854 m)   Wt 86.2 kg   SpO2 97%   BMI 25.07 kg/m    Constitutional :  alert, cooperative, appears stated age and no distress  Lymphatics/Throat:  no asymmetry, masses, or scars  Respiratory:  clear to auscultation bilaterally  Cardiovascular:  regular rate and rhythm  Gastrointestinal: soft, non-tender; bowel sounds normal; no masses,  no organomegaly.   Musculoskeletal: Steady movement  Skin: Cool and moist  Psychiatric: Normal affect, non-agitated, not confused       LABS:  CMP Latest Ref Rng & Units 11/03/2020 11/02/2020 11/01/2020  Glucose 70 - 99 mg/dL 91 119(H) 139(H)  BUN 8 - 23 mg/dL 29(H) 36(H) 19  Creatinine 0.61 - 1.24 mg/dL 0.90 1.06 0.77  Sodium 135 - 145 mmol/L  129(L) 135 133(L)  Potassium 3.5 - 5.1 mmol/L 4.1 3.8 4.2  Chloride 98 - 111 mmol/L 96(L) 95(L) 99  CO2 22 - 32 mmol/L 21(L) 27 24  Calcium 8.9 - 10.3 mg/dL 7.5(L) 7.8(L) 8.0(L)  Total Protein 6.5 - 8.1 g/dL 5.6(L) 6.2(L) 6.0(L)  Total Bilirubin 0.3 - 1.2 mg/dL 0.8 0.6 0.9  Alkaline Phos 38 - 126 U/L 64 58 48  AST 15 - 41 U/L 21 26 24   ALT 0 - 44 U/L 11 12 12    CBC Latest Ref Rng & Units 11/03/2020 11/02/2020 11/01/2020  WBC 4.0 - 10.5 K/uL 20.0(H) 20.7(H) 14.5(H)  Hemoglobin 13.0 - 17.0 g/dL 14.2 16.3 13.6  Hematocrit 39.0 - 52.0 % 42.0 47.2 40.8  Platelets 150 - 400 K/uL 145(L) 184 156     RADS: CLINICAL DATA:  Diffuse abdominal discomfort that was sharp, and located in the right upper quadrant and epigastrum. He is complaining of severe GERD and nausea. Pancreatitis, acute, severe  EXAM: CT ABDOMEN AND PELVIS WITH CONTRAST  TECHNIQUE: Multidetector CT imaging of the abdomen and pelvis was performed using the standard protocol following bolus administration of intravenous contrast.  CONTRAST:  <See Chart> OMNIPAQUE IOHEXOL 240 MG/ML SOLN, 149mL OMNIPAQUE IOHEXOL 300 MG/ML SOLN  COMPARISON:  MRI abdomen 11/01/2020, CT abdomen pelvis 10/31/2020  FINDINGS: Lower chest: Interval development of bilateral trace volume pleural effusions with  associated passive atelectasis bilateral lower lobes. Coronary artery calcifications.  Hepatobiliary: Redemonstration of a stable 2.1 cm hypodensity within the right hepatic lobe (2:11) that is consistent with a known hepatic hemangioma better evaluated on MRI abdomen 11/01/2020. Subcentimeter hypodensities are again noted and too small to characterize. No focal liver abnormality. Layering high density within the gallbladder lumen likely represent vicarious excretion of previously administered intravenous contrast. No gallstones, gallbladder wall thickening, or pericholecystic fluid. No biliary dilatation.  Pancreas: Hazy  pancreatic contour with interval increase in peripancreatic fat stranding and free fluid. No pseudocyst formation. Focal borderline dilatation of the main pancreatic duct in the proximal pancreas is poorly visualized but still present. Finding better evaluated on MRI abdomen 11/01/2020. No diffuse main pancreatic duct dilatation.  Spleen: Normal in size without focal abnormality.  Adrenals/Urinary Tract: No adrenal nodule bilaterally. Bilateral kidneys enhance symmetrically. No hydronephrosis. No hydroureter. The urinary bladder is unremarkable. On delayed imaging, there is no urothelial wall thickening and there are no filling defects in the opacified portions of the bilateral collecting systems or ureters.  Stomach/Bowel: Stomach is within normal limits. No evidence of bowel wall thickening or dilatation. Appendix appears normal.  Vascular/Lymphatic: The portal, splenic, superior mesenteric veins are patent. Limited evaluation of the splenic artery on this portal venous single phase contrast study. No abdominal aorta or iliac aneurysm. Severe atherosclerotic plaque of the aorta and its branches. Redemonstration of ill-defined hazy density throughout the retroperitoneum along the aorta andinferior vena cava with extension anteriorly to the mesenteric vessels suggestive of possibly lymphoproliferative disorder.  Reproductive: The prostate is enlarged measuring up to 5.1 cm.  Other: Interval increase in moderate volume simple fluid ascites. No intraperitoneal free gas. No organized fluid collection.  Musculoskeletal:  No abdominal wall hernia or abnormality.  No suspicious lytic or blastic osseous lesions. No acute displaced fracture. Multilevel degenerative changes of the spine.  IMPRESSION: 1. Acute pancreatitis with interval increase in moderate volume simple free fluid ascites. No pseudocyst formation. 2. 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 Recommend PET-CT for further evaluation. 3. Sigmoid diverticulosis with no acute diverticulitis. 4.  Aortic Atherosclerosis (ICD10-I70.0).   Electronically Signed   By: Iven Finn M.D.   On: 11/02/2020 16:49  Assessment:      Gallstone pancreatitis, no other obvious cause of pancreatitis noted.  Increasing ascites notes on recent CT, possibly reactive from pancreatitis?  Retroperitoneal stranding also noted.  Anatomically away from intended area of operation, so should not have any issues.  Pt and family eager to prevent further recurrence, so will proceed with surgery once echo clearance obtained and Na level normalizes.  Plan:      Discussed the risk of surgery including post-op infxn, seroma, biloma, chronic pain, poor-delayed wound healing, retained gallstone, conversion to open procedure, post-op SBO or ileus, and need for additional procedures to address said risks.  The risks of general anesthetic including MI, CVA, sudden death or even reaction to anesthetic medications also discussed. Alternatives include continued observation.  Benefits include possible symptom relief, prevention of complications including acute cholecystitis, pancreatitis.  Typical post operative recovery of 3-5 days rest, continued pain in area and incision sites, possible loose stools up to 4-6 weeks, also discussed.  The patient understands the risks, any and all questions were answered to the patient's satisfaction.

## 2020-11-03 NOTE — Progress Notes (Signed)
*  PRELIMINARY RESULTS* Echocardiogram 2D Echocardiogram has been performed.  Kevin Terrell 11/03/2020, 3:26 PM

## 2020-11-03 NOTE — Consult Note (Signed)
Subjective:   CC: gallstone pancreatitis  HPI:  Kevin Terrell is a 80 y.o. male who is consulted by [facility] for evaluation of above cc.  Symptoms were first noted a few days ago. Pain is abdominal, started shortly before syncopal episode.  Associated with nothing specific, exacerbated by nothing specific.  Workup revealed pancreatitis, likely gallstone in origin, due to initially elevated bili with no other obvious causes.  Currently feeling better.     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  Past Surgical History:  has a past surgical history that includes Lung lobectomy; Knee surgery; Colonoscopy; Upper gi endoscopy; Colonoscopy with propofol (N/A, 11/30/2019); SCM resection (3557); Knee cartilage surgery (Right, 1968); and Eye surgery.  Family History: family history includes Alzheimer's disease in his father and mother; Cancer in his mother; Hypertension in his father; Stroke in his maternal grandmother.  Social History:  reports that he has never smoked. He has never used smokeless tobacco. He reports current alcohol use. He reports that he does not use drugs.  Current Medications:  Prior to Admission medications   Medication Sig Start Date End Date Taking? Authorizing Provider  cetirizine (ZYRTEC) 10 MG tablet Take 10 mg by mouth daily.   Yes [provider]  cholecalciferol (VITAMIN D3) 25 MCG (1000 UT) tablet Take 5,000 Units by mouth daily.   Yes [provider]  famotidine (PEPCID) 20 MG tablet Take 1 tablet (20 mg total) by mouth at bedtime. 06/01/20  Yes [name], MD  fluticasone (FLONASE) 50 MCG/ACT nasal spray Place 1 spray into both nostrils 2 (two) times daily.   Yes [provider]  gabapentin (NEURONTIN) 300 MG capsule TAKE 1 CAPSULE BY MOUTH TWICE  DAILY 02/13/20  Yes Burnard Hawthorne, FNP  levothyroxine (SYNTHROID) 50 MCG tablet TAKE 1 TABLET BY MOUTH ONCE A DAY BEFOREBREAKFAST. 10/30/20  Yes [name], MD  meloxicam (MOBIC) 15 MG tablet TAKE ONE TABLET BY MOUTH DAILY 07/09/20  Yes Burnard Hawthorne, FNP  montelukast (SINGULAIR) 10 MG tablet TAKE 1 TABLET BY MOUTH EVERY NIGHT AT BEDTIME 04/03/20  Yes Burnard Hawthorne, FNP  Multiple Vitamin (MULTIVITAMIN WITH MINERALS) TABS tablet Take 1 tablet by mouth daily.   Yes [provider]  Probiotic Product (PROBIOTIC PO) Take 1 capsule by mouth 2 (two) times daily.   Yes [provider]  rosuvastatin (CRESTOR) 10 MG tablet Take 1 tablet (10 mg total) by mouth daily. 10/28/19  Yes Burnard Hawthorne, FNP  ketoconazole (NIZORAL) 2 % cream Apply  a small amount to affected area twice a day Patient not taking: No sig reported 08/22/19   [provider]  nystatin-triamcinolone ointment (MYCOLOG) Apply 1 application topically 2 (two) times daily. Patient not taking: No sig reported 06/28/20   Billey Co, MD  Olopatadine HCl 0.2 % SOLN as needed.  Patient not taking: No sig reported    [provider]  tacrolimus (PROTOPIC) 0.1 % ointment Apply topically 2 (two) times daily. 07/04/20   Moye, [place], MD  tacrolimus (PROTOPIC) 0.1 % ointment Apply topically in the morning and at bedtime. Patient not taking: No sig reported 07/24/20   Moye, [place], MD  triamcinolone ointment (KENALOG) 0.1 % APPLY TO HANDS AT BEDTIME AND COVER WITHGLOVES AS NEEDED FOR FLARES. AVOID FACE/GROIN/AXILLA. Patient not taking: No sig reported 06/11/20   Mar-Mac, [place],  MD    Allergies:  Allergies as of 10/31/2020 - Review Complete 10/31/2020  Allergen Reaction Noted   Compazine [prochlorperazine edisylate]  05/17/2018   Other  05/17/2018    ROS:  General: Denies weight loss, weight gain, fatigue, fevers, chills, and night sweats. Eyes: Denies blurry vision, double  vision, eye pain, itchy eyes, and tearing. Ears: Denies hearing loss, earache, and ringing in ears. Nose: Denies sinus pain, congestion, infections, runny nose, and nosebleeds. Mouth/throat: Denies hoarseness, sore throat, bleeding gums, and difficulty swallowing. Heart: Denies chest pain, palpitations, racing heart, irregular heartbeat, leg pain or swelling, and decreased activity tolerance. Respiratory: Denies breathing difficulty, shortness of breath, wheezing, cough, and sputum. GI: Denies change in appetite, heartburn, nausea, vomiting, constipation, diarrhea, and blood in stool. GU: Denies difficulty urinating, pain with urinating, urgency, frequency, blood in urine. Musculoskeletal: Denies joint stiffness, pain, swelling, muscle weakness. Skin: Denies rash, itching, mass, tumors, sores, and boils Neurologic: Denies headache, fainting, dizziness, seizures, numbness, and tingling. Psychiatric: Denies depression, anxiety, difficulty sleeping, and memory loss. Endocrine: Denies heat or cold intolerance, and increased thirst or urination. Blood/lymph: Denies easy bruising, and swollen glands     Objective:     BP 140/72 (BP Location: Left Arm)    Pulse 84    Temp 98.1 F (36.7 C)    Resp 20    Ht 6\' 1"  (1.854 m)    Wt 86.2 kg    SpO2 97%    BMI 25.07 kg/m    Constitutional :  alert, cooperative, appears stated age and no distress  Lymphatics/Throat:  no asymmetry, masses, or scars  Respiratory:  clear to auscultation bilaterally  Cardiovascular:  regular rate and rhythm  Gastrointestinal: soft, non-tender; bowel sounds normal; no masses,  no organomegaly.   Musculoskeletal: Steady movement  Skin: Cool and moist  Psychiatric: Normal affect, non-agitated, not confused       LABS:  CMP Latest Ref Rng & Units 11/03/2020 11/02/2020 11/01/2020  Glucose 70 - 99 mg/dL 91 119(H) 139(H)  BUN 8 - 23 mg/dL 29(H) 36(H) 19  Creatinine 0.61 - 1.24 mg/dL 0.90 1.06 0.77  Sodium 135 - 145 mmol/L  129(L) 135 133(L)  Potassium 3.5 - 5.1 mmol/L 4.1 3.8 4.2  Chloride 98 - 111 mmol/L 96(L) 95(L) 99  CO2 22 - 32 mmol/L 21(L) 27 24  Calcium 8.9 - 10.3 mg/dL 7.5(L) 7.8(L) 8.0(L)  Total Protein 6.5 - 8.1 g/dL 5.6(L) 6.2(L) 6.0(L)  Total Bilirubin 0.3 - 1.2 mg/dL 0.8 0.6 0.9  Alkaline Phos 38 - 126 U/L 64 58 48  AST 15 - 41 U/L 21 26 24   ALT 0 - 44 U/L 11 12 12    CBC Latest Ref Rng & Units 11/03/2020 11/02/2020 11/01/2020  WBC 4.0 - 10.5 K/uL 20.0(H) 20.7(H) 14.5(H)  Hemoglobin 13.0 - 17.0 g/dL 14.2 16.3 13.6  Hematocrit 39.0 - 52.0 % 42.0 47.2 40.8  Platelets 150 - 400 K/uL 145(L) 184 156     RADS: CLINICAL DATA:  Diffuse abdominal discomfort that was sharp, and located in the right upper quadrant and epigastrum. He is complaining of severe GERD and nausea. Pancreatitis, acute, severe  EXAM: CT ABDOMEN AND PELVIS WITH CONTRAST  TECHNIQUE: Multidetector CT imaging of the abdomen and pelvis was performed using the standard protocol following bolus administration of intravenous contrast.  CONTRAST:  <See Chart> OMNIPAQUE IOHEXOL 240 MG/ML SOLN, 145mL OMNIPAQUE IOHEXOL 300 MG/ML SOLN  COMPARISON:  MRI abdomen 11/01/2020, CT abdomen pelvis 10/31/2020  FINDINGS: Lower chest: Interval development  of bilateral trace volume pleural effusions with associated passive atelectasis bilateral lower lobes. Coronary artery calcifications.  Hepatobiliary: Redemonstration of a stable 2.1 cm hypodensity within the right hepatic lobe (2:11) that is consistent with a known hepatic hemangioma better evaluated on MRI abdomen 11/01/2020. Subcentimeter hypodensities are again noted and too small to characterize. No focal liver abnormality. Layering high density within the gallbladder lumen likely represent vicarious excretion of previously administered intravenous contrast. No gallstones, gallbladder wall thickening, or pericholecystic fluid. No biliary dilatation.  Pancreas: Hazy  pancreatic contour with interval increase in peripancreatic fat stranding and free fluid. No pseudocyst formation. Focal borderline dilatation of the main pancreatic duct in the proximal pancreas is poorly visualized but still present. Finding better evaluated on MRI abdomen 11/01/2020. No diffuse main pancreatic duct dilatation.  Spleen: Normal in size without focal abnormality.  Adrenals/Urinary Tract: No adrenal nodule bilaterally. Bilateral kidneys enhance symmetrically. No hydronephrosis. No hydroureter. The urinary bladder is unremarkable. On delayed imaging, there is no urothelial wall thickening and there are no filling defects in the opacified portions of the bilateral collecting systems or ureters.  Stomach/Bowel: Stomach is within normal limits. No evidence of bowel wall thickening or dilatation. Appendix appears normal.  Vascular/Lymphatic: The portal, splenic, superior mesenteric veins are patent. Limited evaluation of the splenic artery on this portal venous single phase contrast study. No abdominal aorta or iliac aneurysm. Severe atherosclerotic plaque of the aorta and its branches. Redemonstration of ill-defined hazy density throughout the retroperitoneum along the aorta andinferior vena cava with extension anteriorly to the mesenteric vessels suggestive of possibly lymphoproliferative disorder.  Reproductive: The prostate is enlarged measuring up to 5.1 cm.  Other: Interval increase in moderate volume simple fluid ascites. No intraperitoneal free gas. No organized fluid collection.  Musculoskeletal:  No abdominal wall hernia or abnormality.  No suspicious lytic or blastic osseous lesions. No acute displaced fracture. Multilevel degenerative changes of the spine.  IMPRESSION: 1. Acute pancreatitis with interval increase in moderate volume simple free fluid ascites. No pseudocyst formation. 2. 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 Recommend PET-CT for further evaluation. 3. Sigmoid diverticulosis with no acute diverticulitis. 4.  Aortic Atherosclerosis (ICD10-I70.0).   Electronically Signed   By: Iven Finn M.D.   On: 11/02/2020 16:49  Assessment:      Gallstone pancreatitis, no other obvious cause of pancreatitis noted.  Increasing ascites notes on recent CT, possibly reactive from pancreatitis?  Retroperitoneal stranding also noted.  Anatomically away from intended area of operation, so should not have any issues.  Pt and family eager to prevent further recurrence, so will proceed with surgery once echo clearance obtained and Na level normalizes.  Plan:      Discussed the risk of surgery including post-op infxn, seroma, biloma, chronic pain, poor-delayed wound healing, retained gallstone, conversion to open procedure, post-op SBO or ileus, and need for additional procedures to address said risks.  The risks of general anesthetic including MI, CVA, sudden death or even reaction to anesthetic medications also discussed. Alternatives include continued observation.  Benefits include possible symptom relief, prevention of complications including acute cholecystitis, pancreatitis.  Typical post operative recovery of 3-5 days rest, continued pain in area and incision sites, possible loose stools up to 4-6 weeks, also discussed.  The patient understands the risks, any and all questions were answered to the patient's satisfaction.

## 2020-11-03 NOTE — Progress Notes (Signed)
PROGRESS NOTE  Kevin Terrell OMB:[id number] DOB: 08/07/1941 DOA: 10/31/2020 PCP: [name], MD  Brief History   The patient is a 80 yr old man who presented to [facility] ED on 10/31/2020 with complaints of syncopal episode. The patient was doing tai chi when he developed dizziness and lightheadedness. He felt that he was going to lose consciousness. He sat down. He then passed out. He did not hit his head or harm himself after passing out. There was no confusion, headache, acute focal weakness, acute focal numbness, paresthesias, dysarthria, facial droop, vertigo, or dysphagia after he regained consciousness. He states that he was out for a few seconds. This episode was also not associated with  any tonic-clonic activity, tongue biting, or loss of bowel/bladder function. he reports a history of 1 prior syncopal event that occurred approximately 30 years ago while he lived in [place]. He was found to be orthostatic on that occasion.  He also reports one day of diffuse abdominal discomfort that was sharp, and located in the right upper quadrant and epigastrum. He is complaining of severe GERD and nausea.  MRCP was obtained and demonstrated acute pancreatitis, dilated pancreatic duct, and CBD. There is also a question of obstruction of gallbladder. LFT's were elevated on admissionGI was consulted. The patient has been evaluated by Dr. Allen Norris. He suggests that possibly patient has passed stone. He recommends that the patient be placed on clear liquid diet and monitor over the next day. If he does not improve the patient may need an ERCP. If he improves perhaps he can be discharged.  GI has recommended consult with General Surgery re possible cholecystectomy to prevent further episodes of gallstone pancreatitis.  The patient was also taking Crestor that may have contributed to pancreatitis.  Dr. Allen Norris evaluated the patient. Today the patient's abdomen is more distended and pain is worse. LFT's are stable.  Dr. Allen Norris has recommended CT abdomen and pelvis. CBC shows a greatly increased WBC count. Hemoglobin was stable. After stool softeners the patient had 4 BM's yesterday. His daughter is at bedside.  Consultants  . Gastroenterology  Procedures  . None  Antibiotics   Anti-infectives (From admission, onward)   None     Subjective  The patient is awake, alert, and oriented x 3. No acute distress. He states that he is feeling better and wants to advance his diet.   Objective   Vitals:  Vitals:   11/03/20 0801 11/03/20 1142  BP: (!) 146/69 130/68  Pulse: 89 85  Resp: 20 20  Temp: 98.6 F (37 C) 98.9 F (37.2 C)  SpO2: 97% 98%    Exam:  Constitutional:  . The patient is awake, alert, and oriented x 3. He is in no acute distress. Respiratory:  . No increased work of breathing. . No wheezes, rales, or rhonchi . No tactile fremitus Cardiovascular:  . Regular rate and rhythm . No murmurs, ectopy, or gallups. . No lateral PMI. No thrills. Abdomen:  . Abdomen is quite distended, The abdomen is diffusely tender. It is particularly tender in the right and left upper quadrants.  . No hernias, masses, or organomegaly . Hypoactive bowel sounds.  Musculoskeletal:  . No cyanosis, clubbing, or edema Skin:  . No rashes, lesions, ulcers . palpation of skin: no induration or nodules Neurologic:  . CN 2-12 intact . Sensation all 4 extremities intact Psychiatric:  . Mental status o Mood, affect appropriate o Orientation to person, place, time  . judgment and insight appear intact I  have personally reviewed the following:   Today's Data  . Vitals, CMP, CBC  Micro Data  . Blood cultures x 2 obtained.  Imaging  . MRCP  Cardiology Data  . EKG  Scheduled Meds: . docusate sodium  100 mg Oral BID  . levothyroxine  50 mcg Oral Q0600  . ondansetron (ZOFRAN) IV  4 mg Intravenous Q6H  . pantoprazole (PROTONIX) IV  40 mg Intravenous Q24H   Continuous Infusions: . lactated  ringers 125 mL/hr at 11/03/20 1109    Principal Problem:   Syncope Active Problems:   Hypothyroidism   Gastroesophageal reflux disease without esophagitis   Abdominal pain   Dilation of pancreatic duct   Dehydration   Acute pancreatitis   LOS: 3 days   A & P   Syncope:Likely vasovagal complicated by hypovolemia due to poor PO intake for the past couple of days. The patient has received IV fluids. Orthostatics are still pending. Volume status will be carefully monitored. Echocardiogram report is pending.   Acute pancreatitis with dilation of pancreatic duct on ERCP: Lipase 2209 on admission. T bili 1.3 on admission, down to 0.9 this morning. The patient states that he feels better.  CT abdomen/pelvis/MRCP with contrast demonstrates dilation of the pancreatic duct to 6 mm without overt obstructing stone/mass and without overt pancreatic wall thickening or peripancreatic fluid noted, while demonstrating no evidence of acute cholecystitis or choledocholithiasis. There is evidence of acute pancreatitis. Etiology is unclear. Patient is not a drinker. Triglycerides are normal. No history of pancreatitis in family or personally for this patient. He is on crestor at home which does have pancreatitis as an adverse reaction, although GI feels that it is likely related to the patient passing a stone recently and the associated inflammation. Will hold crestor as inpatient.  In the meantime we will treat conservatively with IV fluids, as needed IV analgesia, and allow for bowel rest.   The patient was kept NPO overnight on 11/01/2020 in case he required ERCP. However, the morning of 11/02/2020 the plan per Dr. Allen Norris is not for ERCP at this time. The patient went for a repeat CT of his abdomen. It demonstrated no necrosis, no pseudocyst, and ascites. However this morning he is doing better. He wants to advance his diet.   GI has recommended general surgery consult to have the patient evaluated for possible  cholecystectomy to prevent future possible gallstone pancreatitis.  Dehydration: Diagnosis on the basis of physical exam evidence that includes dry oral mucous membrane, in addition to suggestion of dehydration stemming from urinalysis result, included elevated specific gravity of 1.046.  This appears to be on the basis of recent decline in oral intake of water over the last 2 to 3 days, as further noted above.  Orthostatics are pending. Continue IV fluids.   Plan: Continue IV fluids, as above.  Monitor strict I's and O's and daily weights.  Recommend management of presenting abdominal discomfort, as above.  Moniotr volume status and renal function.  GERD: Patient confirms a history of such, and acknowledges good compliance with his home daily Pepcid.  Noted continue Protonix. He reports that he previously underwent EGD approximately 10 to 12 years ago which showed no evidence of acute process, including no evidence of mass, esophagitis, gastritis, or peptic ulcer disease.   Acquired hypothyroidism: Continue synthroid as at home.  Osteoarthritis of the bilateral knees: On daily meloxicam in the absence of any additional NSAIDs at home. Holding NSAIDS at this time.   I have  seen and examined this patient myself. I have spent 35 minutes in his evaluation and care. I have spent more than 50% of this in coordination of care with GI.  DVT prophylaxis: scd's  Code Status: Full code Family Communication: Case was discussed with the patient's daughter, who was present at bedside. Disposition Plan:  Status is: Inpatient  Remains inpatient appropriate because:Ongoing diagnostic testing needed not appropriate for outpatient work up  Dispo: The patient is from: Home              Anticipated d/c is to: Home              Patient currently is not medically stable to d/c.   Difficult to place patient No  Ava Swayze, DO [facility] Direct contact: see [email or web address]  7PM-7AM contact night  coverage as above 11/03/2020, 1:51 PM  LOS: 1 day

## 2020-11-04 ENCOUNTER — Inpatient Hospital Stay: Payer: Medicare Other | Admitting: Anesthesiology

## 2020-11-04 ENCOUNTER — Encounter: Payer: Self-pay | Admitting: Internal Medicine

## 2020-11-04 ENCOUNTER — Encounter
Admission: EM | Disposition: A | Discharge status: Skilled Nursing Facility | Payer: Self-pay | Source: Home / Self Care | Attending: Internal Medicine

## 2020-11-04 DIAGNOSIS — R109 Unspecified abdominal pain: Secondary | ICD-10-CM | POA: Diagnosis not present

## 2020-11-04 DIAGNOSIS — Z9049 Acquired absence of other specified parts of digestive tract: Secondary | ICD-10-CM | POA: Insufficient documentation

## 2020-11-04 DIAGNOSIS — K8689 Other specified diseases of pancreas: Secondary | ICD-10-CM

## 2020-11-04 DIAGNOSIS — K859 Acute pancreatitis without necrosis or infection, unspecified: Secondary | ICD-10-CM | POA: Diagnosis not present

## 2020-11-04 LAB — CBC WITH DIFFERENTIAL/PLATELET
Abs Immature Granulocytes: 0.05 10*3/uL (ref 0.00–0.07)
Basophils Absolute: 0 10*3/uL (ref 0.0–0.1)
Basophils Relative: 0 %
Eosinophils Absolute: 0.1 10*3/uL (ref 0.0–0.5)
Eosinophils Relative: 1 %
HCT: 31.1 % — ABNORMAL LOW (ref 39.0–52.0)
Hemoglobin: 10.7 g/dL — ABNORMAL LOW (ref 13.0–17.0)
Immature Granulocytes: 0 %
Lymphocytes Relative: 5 %
Lymphs Abs: 0.6 10*3/uL — ABNORMAL LOW (ref 0.7–4.0)
MCH: 30.7 pg (ref 26.0–34.0)
MCHC: 34.4 g/dL (ref 30.0–36.0)
MCV: 89.1 fL (ref 80.0–100.0)
Monocytes Absolute: 1.4 10*3/uL — ABNORMAL HIGH (ref 0.1–1.0)
Monocytes Relative: 11 %
Neutro Abs: 10.3 10*3/uL — ABNORMAL HIGH (ref 1.7–7.7)
Neutrophils Relative %: 83 %
Platelets: 128 10*3/uL — ABNORMAL LOW (ref 150–400)
RBC: 3.49 MIL/uL — ABNORMAL LOW (ref 4.22–5.81)
RDW: 12.2 % (ref 11.5–15.5)
WBC: 12.4 10*3/uL — ABNORMAL HIGH (ref 4.0–10.5)
nRBC: 0 % (ref 0.0–0.2)

## 2020-11-04 LAB — BASIC METABOLIC PANEL
Anion gap: 4 — ABNORMAL LOW (ref 5–15)
BUN: 16 mg/dL (ref 8–23)
CO2: 27 mmol/L (ref 22–32)
Calcium: 8 mg/dL — ABNORMAL LOW (ref 8.9–10.3)
Chloride: 103 mmol/L (ref 98–111)
Creatinine, Ser: 0.65 mg/dL (ref 0.61–1.24)
GFR, Estimated: >60 mL/min (ref >60)
Glucose, Bld: 105 mg/dL — ABNORMAL HIGH (ref 70–99)
Potassium: 3.6 mmol/L (ref 3.5–5.1)
Sodium: 134 mmol/L — ABNORMAL LOW (ref 135–145)

## 2020-11-04 LAB — PROTIME-INR
INR: 1.4 — ABNORMAL HIGH (ref 0.8–1.2)
Prothrombin Time: 16.8 seconds — ABNORMAL HIGH (ref 11.4–15.2)

## 2020-11-04 SURGERY — CHOLECYSTECTOMY, ROBOT-ASSISTED, LAPAROSCOPIC
Anesthesia: General

## 2020-11-04 MED ORDER — CEFAZOLIN SODIUM 1 G IJ SOLR
INTRAMUSCULAR | Status: AC
  Filled 2020-11-04: qty 20

## 2020-11-04 MED ORDER — FENTANYL CITRATE (PF) 100 MCG/2ML IJ SOLN
25.0000 ug | INTRAMUSCULAR | Status: DC | PRN
Start: 2020-11-04 — End: 2020-11-04
  Administered 2020-11-04: 25 ug via INTRAVENOUS

## 2020-11-04 MED ORDER — DEXAMETHASONE SODIUM PHOSPHATE 10 MG/ML IJ SOLN
INTRAMUSCULAR | Status: DC | PRN
  Administered 2020-11-04: 10 mg via INTRAVENOUS

## 2020-11-04 MED ORDER — ROCURONIUM BROMIDE 100 MG/10ML IV SOLN
INTRAVENOUS | Status: DC | PRN
  Administered 2020-11-04: 45 mg via INTRAVENOUS
  Administered 2020-11-04: 5 mg via INTRAVENOUS
  Administered 2020-11-04: 10 mg via INTRAVENOUS

## 2020-11-04 MED ORDER — SUGAMMADEX SODIUM 200 MG/2ML IV SOLN
INTRAVENOUS | Status: DC | PRN
  Administered 2020-11-04: 200 mg via INTRAVENOUS

## 2020-11-04 MED ORDER — FLUTICASONE PROPIONATE 50 MCG/ACT NA SUSP
1.0000 | Freq: Two times a day (BID) | NASAL | Status: DC
  Administered 2020-11-05 – 2020-11-07 (×4): 1 via NASAL
  Filled 2020-11-04: qty 16

## 2020-11-04 MED ORDER — FENTANYL CITRATE (PF) 100 MCG/2ML IJ SOLN
INTRAMUSCULAR | Status: AC
  Filled 2020-11-04: qty 2

## 2020-11-04 MED ORDER — MONTELUKAST SODIUM 10 MG PO TABS
10.0000 mg | ORAL_TABLET | Freq: Every day | ORAL | Status: DC
Start: 2020-11-04 — End: 2020-11-08
  Administered 2020-11-04 – 2020-11-07 (×4): 10 mg via ORAL
  Filled 2020-11-04 (×4): qty 1

## 2020-11-04 MED ORDER — LACTATED RINGERS IV SOLN: INTRAVENOUS | Status: DC | PRN

## 2020-11-04 MED ORDER — GABAPENTIN 300 MG PO CAPS
300.0000 mg | ORAL_CAPSULE | Freq: Two times a day (BID) | ORAL | Status: DC
Start: 2020-11-04 — End: 2020-11-08
  Administered 2020-11-04 – 2020-11-08 (×8): 300 mg via ORAL
  Filled 2020-11-04 (×8): qty 1

## 2020-11-04 MED ORDER — MELOXICAM 7.5 MG PO TABS
15.0000 mg | ORAL_TABLET | Freq: Every day | ORAL | Status: DC
  Administered 2020-11-05 – 2020-11-07 (×2): 15 mg via ORAL
  Filled 2020-11-04 (×4): qty 2

## 2020-11-04 MED ORDER — ENOXAPARIN SODIUM 40 MG/0.4ML ~~LOC~~ SOLN
40.0000 mg | SUBCUTANEOUS | Status: DC
  Administered 2020-11-05 – 2020-11-08 (×4): 40 mg via SUBCUTANEOUS
  Filled 2020-11-04 (×4): qty 0.4

## 2020-11-04 MED ORDER — MIDAZOLAM HCL 2 MG/2ML IJ SOLN
INTRAMUSCULAR | Status: AC
  Filled 2020-11-04: qty 2

## 2020-11-04 MED ORDER — ONDANSETRON HCL 4 MG/2ML IJ SOLN
INTRAMUSCULAR | Status: DC | PRN
  Administered 2020-11-04: 4 mg via INTRAVENOUS

## 2020-11-04 MED ORDER — CEFAZOLIN SODIUM-DEXTROSE 2-3 GM-%(50ML) IV SOLR
INTRAVENOUS | Status: DC | PRN
  Administered 2020-11-04: 2 g via INTRAVENOUS

## 2020-11-04 MED ORDER — SUCCINYLCHOLINE CHLORIDE 20 MG/ML IJ SOLN
INTRAMUSCULAR | Status: DC | PRN
  Administered 2020-11-04: 100 mg via INTRAVENOUS

## 2020-11-04 MED ORDER — FENTANYL CITRATE (PF) 100 MCG/2ML IJ SOLN
INTRAMUSCULAR | Status: DC | PRN
  Administered 2020-11-04 (×4): 50 ug via INTRAVENOUS

## 2020-11-04 MED ORDER — PROMETHAZINE HCL 25 MG/ML IJ SOLN: 6.2500 mg | INTRAMUSCULAR | Status: DC | PRN

## 2020-11-04 MED ORDER — LIDOCAINE HCL (CARDIAC) PF 100 MG/5ML IV SOSY
PREFILLED_SYRINGE | INTRAVENOUS | Status: DC | PRN
  Administered 2020-11-04: 50 mg via INTRAVENOUS

## 2020-11-04 MED ORDER — LORATADINE 10 MG PO TABS
10.0000 mg | ORAL_TABLET | Freq: Every day | ORAL | Status: DC
  Administered 2020-11-05 – 2020-11-08 (×4): 10 mg via ORAL
  Filled 2020-11-04 (×4): qty 1

## 2020-11-04 MED ORDER — ACETAMINOPHEN 10 MG/ML IV SOLN
INTRAVENOUS | Status: DC | PRN
  Administered 2020-11-04: 1000 mg via INTRAVENOUS

## 2020-11-04 MED ORDER — PROPOFOL 10 MG/ML IV BOLUS
INTRAVENOUS | Status: DC | PRN
Start: 2020-11-04 — End: 2020-11-04
  Administered 2020-11-04: 120 mg via INTRAVENOUS

## 2020-11-04 SURGICAL SUPPLY — 57 items
"PENCIL ELECTRO HAND CTR " (MISCELLANEOUS) ×1 IMPLANT
ANCHOR TIS RET SYS 235ML (MISCELLANEOUS) ×2 IMPLANT
BAG INFUSER PRESSURE 100CC (MISCELLANEOUS) IMPLANT
BLADE SURG SZ11 CARB STEEL (BLADE) ×2 IMPLANT
CANISTER SUCT 1200ML W/VALVE (MISCELLANEOUS) ×2 IMPLANT
CANNULA REDUC XI 12-8 STAPL (CANNULA) ×1
CANNULA REDUCER 12-8 DVNC XI (CANNULA) ×1 IMPLANT
CATH REDDICK CHOLANGI 4FR 50CM (CATHETERS) IMPLANT
CHLORAPREP W/TINT 26 (MISCELLANEOUS) ×2 IMPLANT
CLIP VESOLOCK MED LG 6/CT (CLIP) ×2 IMPLANT
COVER TIP SHEARS 8 DVNC (MISCELLANEOUS) ×1 IMPLANT
COVER TIP SHEARS 8MM DA VINCI (MISCELLANEOUS) ×1
COVER WAND RF STERILE (DRAPES) ×2 IMPLANT
DECANTER SPIKE VIAL GLASS SM (MISCELLANEOUS) ×4 IMPLANT
DEFOGGER SCOPE WARMER CLEARIFY (MISCELLANEOUS) ×2 IMPLANT
DERMABOND ADVANCED (GAUZE/BANDAGES/DRESSINGS) ×1
DERMABOND ADVANCED .7 DNX12 (GAUZE/BANDAGES/DRESSINGS) ×1 IMPLANT
DRAPE ARM DVNC X/XI (DISPOSABLE) ×4 IMPLANT
DRAPE C-ARM XRAY 36X54 (DRAPES) IMPLANT
DRAPE COLUMN DVNC XI (DISPOSABLE) ×1 IMPLANT
DRAPE DA VINCI XI ARM (DISPOSABLE) ×4
DRAPE DA VINCI XI COLUMN (DISPOSABLE) ×1
ELECT CAUTERY BLADE 6.4 (BLADE) ×2 IMPLANT
ELECT REM PT RETURN 9FT ADLT (ELECTROSURGICAL) ×2
ELECTRODE REM PT RTRN 9FT ADLT (ELECTROSURGICAL) ×1 IMPLANT
GLOVE SURG SYN 6.5 ES PF (GLOVE) ×4 IMPLANT
GLOVE SURG SYN 6.5 PF PI (GLOVE) ×2 IMPLANT
GLOVE SURG UNDER POLY LF SZ7 (GLOVE) ×4 IMPLANT
GOWN STRL REUS W/ TWL LRG LVL3 (GOWN DISPOSABLE) ×3 IMPLANT
GOWN STRL REUS W/TWL LRG LVL3 (GOWN DISPOSABLE) ×3
GRASPER SUT TROCAR 14GX15 (MISCELLANEOUS) IMPLANT
IRRIGATOR SUCT 8 DISP DVNC XI (IRRIGATION / IRRIGATOR) IMPLANT
IRRIGATOR SUCTION 8MM XI DISP (IRRIGATION / IRRIGATOR)
IV NS 1000ML (IV SOLUTION)
IV NS 1000ML BAXH (IV SOLUTION) IMPLANT
LABEL OR SOLS (LABEL) ×2 IMPLANT
MANIFOLD NEPTUNE II (INSTRUMENTS) ×2 IMPLANT
NDL INSUFFLATION 14GA 120MM (NEEDLE) ×1 IMPLANT
NEEDLE HYPO 22GX1.5 SAFETY (NEEDLE) ×2 IMPLANT
NEEDLE INSUFFLATION 14GA 120MM (NEEDLE) ×2 IMPLANT
NS IRRIG 500ML POUR BTL (IV SOLUTION) ×2 IMPLANT
OBTURATOR OPTICAL STANDARD 8MM (TROCAR) ×1
OBTURATOR OPTICAL STND 8 DVNC (TROCAR) ×1
OBTURATOR OPTICALSTD 8 DVNC (TROCAR) ×1 IMPLANT
PACK LAP CHOLECYSTECTOMY (MISCELLANEOUS) ×2 IMPLANT
PENCIL ELECTRO HAND CTR (MISCELLANEOUS) ×2 IMPLANT
SEAL CANN UNIV 5-8 DVNC XI (MISCELLANEOUS) ×3 IMPLANT
SEAL XI 5MM-8MM UNIVERSAL (MISCELLANEOUS) ×3
SET TUBE SMOKE EVAC HIGH FLOW (TUBING) ×2 IMPLANT
SOLUTION ELECTROLUBE (MISCELLANEOUS) ×2 IMPLANT
STAPLER CANNULA SEAL DVNC XI (STAPLE) ×1 IMPLANT
STAPLER CANNULA SEAL XI (STAPLE) ×1
SUT MNCRL 4-0 (SUTURE) ×2
SUT MNCRL 4-0 27XMFL (SUTURE) ×2
SUT VICRYL 0 AB UR-6 (SUTURE) ×2 IMPLANT
SUTURE MNCRL 4-0 27XMF (SUTURE) ×2 IMPLANT
SYR 30ML LL (SYRINGE) IMPLANT

## 2020-11-04 NOTE — Progress Notes (Signed)
Kevin Antigua, MD [address], [place], [place], [place], [address] [address], [place], [place], [place], [address] Phone: [phone]  Fax: [phone]   Subjective: Pt sitting in chair and reports much improved abdominal symptoms. Tolerating oral diet.   Objective: Exam: Vital signs in last 24 hours: Vitals:   11/04/20 0402 11/04/20 0726 11/04/20 1127 11/04/20 1518  BP: (!) 142/79 122/77 110/72 118/61  Pulse: 91 85 74 77  Resp: 17 18 16 18   Temp: 99.9 F (37.7 C) 99.6 F (37.6 C) 99.8 F (37.7 C) 98.6 F (37 C)  TempSrc:  Oral Oral Oral  SpO2: 100% 96% 98% 100%  Weight:      Height:       Weight change:  No intake or output data in the 24 hours ending 11/04/20 1603  General: No acute distress, AAO x3 Abd: Soft, NT/ND, No HSM Skin: Warm, no rashes Neck: Supple, Trachea midline   Lab Results: Lab Results  Component Value Date   WBC 12.4 (H) 11/04/2020   HGB 10.7 (L) 11/04/2020   HCT 31.1 (L) 11/04/2020   MCV 89.1 11/04/2020   PLT 128 (L) 11/04/2020   Micro Results: Recent Results (from the past 240 hour(s))  SARS CORONAVIRUS 2 (TAT 6-24 HRS) Nasopharyngeal Nasopharyngeal Swab     Status: None   Collection Time: 10/31/20 10:00 PM   Specimen: Nasopharyngeal Swab  Result Value Ref Range Status   SARS Coronavirus 2 NEGATIVE NEGATIVE Final    Comment: (NOTE) SARS-CoV-2 target nucleic acids are NOT DETECTED.  The SARS-CoV-2 RNA is generally detectable in upper and lower respiratory specimens during the acute phase of infection. Negative results do not preclude SARS-CoV-2 infection, do not rule out co-infections with other pathogens, and should not be used as the sole basis for treatment or other patient management decisions. Negative results must be combined with clinical observations, patient history, and epidemiological information. The expected result is Negative.  Fact Sheet for  Patients: SugarRoll.be  Fact Sheet for Healthcare Providers: https://www.woods-mathews.com/  This test is not yet approved or cleared by the [place] FDA and  has been authorized for detection and/or diagnosis of SARS-CoV-2 by FDA under an Emergency Use Authorization (EUA). This EUA will remain  in effect (meaning this test can be used) for the duration of the COVID-19 declaration under Se ction 564(b)(1) of the Act, 21 U.S.C. section 360bbb-3(b)(1), unless the authorization is terminated or revoked sooner.  Performed at [facility], [place] [address] [place], [place] [address]   CULTURE, BLOOD (ROUTINE X 2) w Reflex to ID Panel     Status: None (Preliminary result)   Collection Time: 11/01/20 10:56 AM   Specimen: BLOOD  Result Value Ref Range Status   Specimen Description BLOOD LEFT ANTECUBITAL  Final   Special Requests   Final    BOTTLES DRAWN AEROBIC AND ANAEROBIC Blood Culture results may not be optimal due to an excessive volume of blood received in culture bottles   Culture   Final    NO GROWTH 3 DAYS Performed at [facility], [address] [place],  [address]    Report Status PENDING  Incomplete  CULTURE, BLOOD (ROUTINE X 2) w Reflex to ID Panel     Status: None (Preliminary result)   Collection Time: 11/01/20 12:38 PM   Specimen: BLOOD  Result Value Ref Range Status   Specimen Description BLOOD RIGHT ANTECUBITAL  Final   Special Requests   Final  BOTTLES DRAWN AEROBIC AND ANAEROBIC Blood Culture adequate volume   Culture   Final    NO GROWTH 3 DAYS Performed at [facility], [address] [place], [place] [address]    Report Status PENDING  Incomplete   Studies/Results: ECHOCARDIOGRAM COMPLETE  Result Date: 11/03/2020    ECHOCARDIOGRAM REPORT   Patient Name:   Kevin Terrell Date of Exam: 11/03/2020 Medical Rec #:  [id number]       Height:       73.0 in Accession #:    [id number]       Weight:       190.0 lb Date of Birth:  07/15/1941      BSA:          2.105 m Patient Age:    80 years        BP:           146/69 mmHg Patient Gender: M               HR:           89 bpm. Exam Location:  [facility] Procedure: 2D Echo, Cardiac Doppler and Color Doppler Indications:     Syncope R55  History:         Patient has no prior history of Echocardiogram examinations.  Sonographer:     Alyse Low Roar Referring Phys:  [id number] Karie Kirks Diagnosing Phys: Ida Rogue MD IMPRESSIONS  1. Left ventricular ejection fraction, by estimation, is 60 to 65%. The left ventricle has normal function. The left ventricle has no regional wall motion abnormalities. Left ventricular diastolic parameters are consistent with Grade I diastolic dysfunction (impaired relaxation).  2. Right ventricular systolic function is normal. The right ventricular size is normal. There is normal pulmonary artery systolic pressure. The estimated right ventricular systolic pressure is 83.1 mmHg.  3. The mitral valve is normal in structure. Mild mitral valve regurgitation. FINDINGS  Left Ventricle: Left ventricular ejection fraction, by estimation, is 60 to 65%. The left ventricle has normal function. The left ventricle has no regional wall motion abnormalities. The left ventricular internal cavity size was normal in size. There is  no left ventricular hypertrophy. Left ventricular diastolic parameters are consistent with Grade I diastolic dysfunction (impaired relaxation). Right Ventricle: The right ventricular size is normal. No increase in right ventricular wall thickness. Right ventricular systolic function is normal. There is normal pulmonary artery systolic pressure. The tricuspid regurgitant velocity is 2.73 m/s, and  with an assumed right atrial pressure of 5 mmHg, the estimated right ventricular systolic pressure is 51.7 mmHg. Left Atrium: Left atrial size was normal in size. Right Atrium: Right atrial size was normal in size. Pericardium: There  is no evidence of pericardial effusion. Mitral Valve: The mitral valve is normal in structure. Mild mitral valve regurgitation. No evidence of mitral valve stenosis. Tricuspid Valve: The tricuspid valve is normal in structure. Tricuspid valve regurgitation is mild . No evidence of tricuspid stenosis. Aortic Valve: The aortic valve is normal in structure. Aortic valve regurgitation is not visualized. No aortic stenosis is present. Aortic valve peak gradient measures 8.4 mmHg. Pulmonic Valve: The pulmonic valve was normal in structure. Pulmonic valve regurgitation is not visualized. No evidence of pulmonic stenosis. Aorta: The aortic root is normal in size and structure. Venous: The inferior vena cava is normal in size with greater than 50% respiratory variability, suggesting right atrial pressure of 3 mmHg. IAS/Shunts: No atrial level shunt detected by color flow Doppler.  LEFT VENTRICLE  PLAX 2D LVIDd:         4.28 cm  Diastology LVIDs:         3.22 cm  LV e' medial:    5.11 cm/s LV PW:         1.11 cm  LV E/e' medial:  9.5 LV IVS:        0.90 cm  LV e' lateral:   5.59 cm/s LVOT diam:     2.30 cm  LV E/e' lateral: 8.7 LVOT Area:     4.15 cm  RIGHT VENTRICLE RV Mid diam:    3.36 cm RV S prime:     19.90 cm/s TAPSE (M-mode): 3.1 cm LEFT ATRIUM             Index       RIGHT ATRIUM           Index LA diam:        4.00 cm 1.90 cm/m  RA Area:     14.80 cm LA Vol (A2C):   44.7 ml 21.23 ml/m RA Volume:   33.60 ml  15.96 ml/m LA Vol (A4C):   54.0 ml 25.65 ml/m LA Biplane Vol: 50.9 ml 24.18 ml/m  AORTIC VALVE                PULMONIC VALVE AV Area (Vmax): 3.15 cm    PV Vmax:        0.96 m/s AV Vmax:        145.00 cm/s PV Peak grad:   3.7 mmHg AV Peak Grad:   8.4 mmHg    RVOT Peak grad: 2 mmHg LVOT Vmax:      110.00 cm/s  AORTA Ao Root diam: 2.40 cm MITRAL VALVE               TRICUSPID VALVE MV Area (PHT): 3.42 cm    TR Peak grad:   29.8 mmHg MV Decel Time: 222 msec    TR Vmax:        273.00 cm/s MV E velocity: 48.80  cm/s MV A velocity: 73.70 cm/s  SHUNTS MV E/A ratio:  0.66        Systemic Diam: 2.30 cm MV A Prime:    17.3 cm/s Ida Rogue MD Electronically signed by Ida Rogue MD Signature Date/Time: 11/03/2020/8:20:07 PM    Final    Medications:  Scheduled Meds: . docusate sodium  100 mg Oral BID  . levothyroxine  50 mcg Oral Q0600  . ondansetron (ZOFRAN) IV  4 mg Intravenous Q6H  . pantoprazole (PROTONIX) IV  40 mg Intravenous Q24H   Continuous Infusions: PRN Meds:.acetaminophen, HYDROcodone-acetaminophen, LORazepam, metoCLOPramide (REGLAN) injection, ondansetron (ZOFRAN) IV   Assessment: Principal Problem:   Syncope Active Problems:   Hypothyroidism   Gastroesophageal reflux disease without esophagitis   Abdominal pain   Dilation of pancreatic duct   Dehydration   Acute pancreatitis    Plan: Acute pancreatitis improving Igg4 pending Surgery has evaluated the patient and recommends cholecystectomy Continue advancing diet to low fat diet as tolerated Further workup for hazy retroperitoneal density as per primary team WBC count much improved and was likely due to inflammatory clinical picture Recommend GI clinic follow up in 4 weeks, please make appt at discharge GI service will sign off, please page with any questions   LOS: 4 days   Kevin Antigua, MD 11/04/2020, 4:03 PM

## 2020-11-04 NOTE — Evaluation (Signed)
Physical Therapy Evaluation Patient Details Name: Kevin Terrell MRN: [id number] DOB: 1941-02-11 Today's Date: 11/04/2020   History of Present Illness  Kevin Terrell is a 80 y.o. male with medical history significant for lung cancer status post right upper lobectomy in April 2009 and status post chemotherapy reportedly now in remission, GERD, acquired hypothyroidism, osteoarthritis, who is admitted to [facility] on 10/31/2020 for further evaluation of syncope after presenting from home to [facility] ED complaining of episode of loss of consciousness.   MRCP was obtained and demonstrated acute pancreatitis, dilated pancreatic duct, and CBD. There is also a question of obstruction of gallbladder. LFT's were elevated on admissionGI was consulted. GI has recommended consult with General Surgery re possible cholecystectomy to prevent further episodes of gallstone pancreatitis.  Pt and family eager to prevent further recurrence, so will proceed with surgery once echo clearance obtained and Na level normalizes.    Clinical Impression  Patient alert and oriented x 4 and able to provide a detailed history. States he lives alone in independent living portion of [place]. His wife is in memory care there. States that prior to hospitalization he was independent with all aspects of care and ambulated household and community distances without an assistive device. Reports no falls in the last 6 months. He is very anxious about being home alone and wants to go to rehab facility at [facility] prior to returning home. Upon PT eval, patient demonstrated modified independence with bed mobility due to slower movement related to pain, supervision for sit <> stand transfers using RW and supervision for ambulation ~ 210 feet around nurses station. He did report feeling of lightheadedness and nausea with initial attempts at mobility and a close chair follow was provided with ambulation for safety in  case of an episode of syncope (which did not occur this ession). Patient initially appeared very reluctant to leave the room and it took two attempts to feel comfortable ambulating with the chair follow out of the room. Patient reported lightheadedness/nausea that did get better part way through ambulation but then he experienced it again when transferring from bed to chair. SpO2 dipped to 92% after initial ambulation attempt but was at 95% following 210 foot walk. HR remained WFL throughout session. Recommend SNF due to patient's lack of confidence with mobility and report of lightheadedness with history of syncope. May update as appropriate depending on how patient progresses during hospital stay. Patient would benefit from skilled physical therapy to address impairments and functional limitations (see PT Problem List below) to work towards stated goals and return to PLOF or maximal functional independence.       Follow Up Recommendations SNF    Equipment Recommendations  Rolling walker with 5" wheels;3in1 (PT)    Recommendations for Other Services       Precautions / Restrictions Precautions Precautions: Fall      Mobility  Bed Mobility Overal bed mobility: Modified Independent             General bed mobility comments: supine to sit with increased time due to pain    Transfers Overall transfer level: Needs assistance Equipment used: Rolling walker (2 wheeled) Transfers: Sit to/from Stand Sit to Stand: Supervision         General transfer comment: Patient completed sit <> stand from bed to RW with supervision. Demonstrated good use of body position and UEs. Complained of increased light headedness and nausea.  Ambulation/Gait Ambulation/Gait assistance: Supervision Gait Distance (Feet): 210 Feet Assistive  device: Rolling walker (2 wheeled) Gait Pattern/deviations: WFL(Within Functional Limits) Gait velocity: slow   General Gait Details: Patient ambulated around  nursing station and complained of increased lightheadedness initially and nausea that got better as he went further. Close chair follow due to concern for pre-syncope or syncope but did not need. HR and SpO2 WFL by end of ambulation. Did feel very limited first attempt at ambulation and did not go more than about 15 feet due to nausea/dizziness. SpO2 noted to be 92% and HR 83 bpm after this attempt. Patient did appear quite anxious, especially about being home alone and feeling lightheaded/nausea.  Stairs            Wheelchair Mobility    Modified Rankin (Stroke Patients Only)       Balance Overall balance assessment: Needs assistance   Sitting balance-Leahy Scale: Good Sitting balance - Comments: steady sitting edge of bed   Standing balance support: Bilateral upper extremity supported;During functional activity Standing balance-Leahy Scale: Fair Standing balance comment: Steady with RW during ambulation. Some concern for lightheadedness so used chair follow for ambulation but did not need it in hallway.                             Pertinent Vitals/Pain Pain Assessment: 0-10 Pain Score: 4  Pain Location: abdomen Pain Intervention(s): Limited activity within patient's tolerance;Monitored during session;Patient requesting pain meds-RN notified    Home Living Family/patient expects to be discharged to:: Private residence Living Arrangements: Alone;Other (Comment) ([place]) Available Help at Discharge: Family;Available PRN/intermittently (patient reports family has their own lives) Type of Home:  (Independent living) Home Access: Stairs to enter Entrance Stairs-Rails: None Entrance Stairs-Number of Steps: 1 step Home Layout: Two level;Able to live on main level with bedroom/bathroom Home Equipment: None Additional Comments: No hand held shower head    Prior Function Level of Independence: Independent         Comments: reports  no falls in last 6 months     Hand Dominance        Extremity/Trunk Assessment   Upper Extremity Assessment Upper Extremity Assessment: Overall WFL for tasks assessed    Lower Extremity Assessment Lower Extremity Assessment: Overall WFL for tasks assessed    Cervical / Trunk Assessment Cervical / Trunk Assessment: Normal  Communication   Communication: No difficulties  Cognition Arousal/Alertness: Awake/alert Behavior During Therapy: WFL for tasks assessed/performed Overall Cognitive Status: Within Functional Limits for tasks assessed                                 General Comments: appears a&Ox 4      General Comments      Exercises Other Exercises Other Exercises: Educated on D/C reccomendations, DME reccomendations, importance of mobility, precautions with lightheadedness.   Assessment/Plan    PT Assessment Patient needs continued PT services  PT Problem List Pain;Decreased activity tolerance;Decreased balance;Decreased knowledge of use of DME;Decreased mobility;Decreased knowledge of precautions;Decreased strength       PT Treatment Interventions DME instruction;Balance training;Gait training;Neuromuscular re-education;Stair training;Functional mobility training;Therapeutic activities;Therapeutic exercise;Patient/family education    PT Goals (Current goals can be found in the Care Plan section)  Acute Rehab PT Goals Patient Stated Goal: To go to rehab prior to returning home PT Goal Formulation: With patient Time For Goal Achievement: 11/18/20 Potential to Achieve Goals: Fair    Frequency Min  2X/week   Barriers to discharge Decreased caregiver support Patient reports he does not have anyone to stay with him    Co-evaluation               AM-PAC PT "6 Clicks" Mobility  Outcome Measure Help needed turning from your back to your side while in a flat bed without using bedrails?: None Help needed moving from lying on your back to  sitting on the side of a flat bed without using bedrails?: None Help needed moving to and from a bed to a chair (including a wheelchair)?: A Little Help needed standing up from a chair using your arms (e.g., wheelchair or bedside chair)?: None Help needed to walk in hospital room?: A Little Help needed climbing 3-5 steps with a railing? : A Little 6 Click Score: 21    End of Session Equipment Utilized During Treatment: Gait belt Activity Tolerance: Other (comment);Patient tolerated treatment well (limited by lightheadedness, nausea, anxiety) Patient left: in chair;with call bell/phone within reach;Other (comment) (OT in room) Nurse Communication: Mobility status PT Visit Diagnosis: Unsteadiness on feet (R26.81);Difficulty in walking, not elsewhere classified (R26.2)    Time: 8937-3428 PT Time Calculation (min) (ACUTE ONLY): 32 min   Charges:   PT Evaluation $PT Eval Low Complexity: 1 Low $PT Eval Moderate Complexity: 1 Mod PT Treatments $Gait Training: 8-22 mins        Everlean Alstrom. Graylon Good, PT, DPT 11/04/20, 10:43 AM

## 2020-11-04 NOTE — Op Note (Addendum)
Preoperative diagnosis:  cholelithiasis and pancreatitis  Postoperative diagnosis: same as above plus purulent ascited  Procedure: Robotic assisted Laparoscopic Cholecystectomy. Ascites fluid sampling and drainage  Anesthesia: GETA   Surgeon: Benjamine Sprague  Specimen: Gallbladder  Complications: None  EBL: 54mL  Wound Classification: Clean Contaminated  Indications: see HPI  Findings: Purulent looking fluid throughout abdomen Critical view of safety noted Cystic duct and artery identified, ligated and divided, clips remained intact at end of procedure Adequate hemostasis  Description of procedure:  The patient was placed on the operating table in the supine position. SCDs placed, pre-op abx administered.  General anesthesia was induced and OG tube placed by anesthesia. A time-out was completed verifying correct patient, procedure, site, positioning, and implant(s) and/or special equipment prior to beginning this procedure. The abdomen was prepped and draped in the usual sterile fashion.    Veress needle was placed at the infraumbilical point due to increased fullness noted in LUQ. and insufflation was started after confirming a positive saline drop test and no immediate increase in abdominal pressure.  After reaching 15 mm, the Veress needle was removed and a 8 mm port was placed via optiview technique under umbilicus measured 64QI from gallbladder.  The abdomen was inspected and no injuries were found.  Purulent looking ascites noted throughout entire abdomen, likely the fluid noted on recent CT scan.  Under direct vision, ports were placed in the following locations: One 12 mm patient left of the umbilicus, 8cm from the optiviewed port, one 8 mm port placed to the patient right of the umbilical port 8 cm apart.  1 additional 8 mm port placed lateral to the 65mm port.  Once ports were placed, The table was placed in the reverse Trendelenburg position with the right side up. The Xi platform  was brought into the operative field and docked to the ports successfully.  An endoscope was placed through the umbilical port, fenestrated grasper through the adjacent patient right port, prograsp to the far patient left port, and then a hook cautery in the left port.  Exam of abdomen noted purulent ascites.  Fluid within abdomen suctioned out and sample sent for cytology and culture, prior to starting chole cystectomy. The dome of the gallbladder was grasped with prograsp, passed and retracted over the dome of the liver. Adhesions between the gallbladder and omentum, duodenum and transverse colon were lysed via hook cautery. The infundibulum was grasped with the fenestrated grasper and retracted toward the right lower quadrant. This maneuver exposed Calot's triangle. The peritoneum overlying the gallbladder infundibulum was then dissected using combination of [place] dissector and electrocautery hook and the cystic duct and cystic artery identified.  Critical view of safety with the liver bed clearly visible behind the duct and artery with no additional structures noted.  The cystic duct and cystic artery clipped and divided close to the gallbladder.     The gallbladder was then dissected from its peritoneal and liver bed attachments by electrocautery. Visible remaining ascites within abdomen was extensively suctioned out.  Hemostasis was checked prior to removing the hook cautery and the Endo Catch bag was then placed through the 12 mm port and the gallbladder was removed.  The gallbladder was passed off the table as a specimen. There was no evidence of bleeding from the gallbladder fossa or cystic artery or leakage of the bile from the cystic duct stump. The 12 mm port site closed with PMI using 0 vicryl under direct vision.  Abdomen desufflated and secondary trocars  were removed under direct vision. No bleeding was noted. All skin incisions then closed with subcuticular sutures of 4-0 monocryl and dressed  with topical skin adhesive. The orogastric tube was removed and patient extubated.  The patient tolerated the procedure well and was taken to the postanesthesia care unit in stable condition.  All sponge and instrument count correct at end of procedure.

## 2020-11-04 NOTE — Progress Notes (Addendum)
PROGRESS NOTE  Kevin Terrell OKH:[id number] DOB: 1940-12-25 DOA: 10/31/2020 PCP: [name], MD  Brief History   The patient is a 80 yr old man who presented to [facility] ED on 10/31/2020 with complaints of syncopal episode. The patient was doing tai chi when he developed dizziness and lightheadedness. He felt that he was going to lose consciousness. He sat down. He then passed out. He did not hit his head or harm himself after passing out. There was no confusion, headache, acute focal weakness, acute focal numbness, paresthesias, dysarthria, facial droop, vertigo, or dysphagia after he regained consciousness. He states that he was out for a few seconds. This episode was also not associated with  any tonic-clonic activity, tongue biting, or loss of bowel/bladder function. he reports a history of 1 prior syncopal event that occurred approximately 30 years ago while he lived in [place]. He was found to be orthostatic on that occasion.  He also reports one day of diffuse abdominal discomfort that was sharp, and located in the right upper quadrant and epigastrum. He is complaining of severe GERD and nausea.  MRCP was obtained and demonstrated acute pancreatitis, dilated pancreatic duct, and CBD. There is also a question of obstruction of gallbladder. LFT's were elevated on admissionGI was consulted. The patient has been evaluated by Dr. Allen Norris. He suggests that possibly patient has passed stone. He recommends that the patient be placed on clear liquid diet and monitor over the next day. If he does not improve the patient may need an ERCP. If he improves perhaps he can be discharged.  GI has recommended consult with General Surgery re possible cholecystectomy to prevent further episodes of gallstone pancreatitis.  The patient was also taking Crestor that may have contributed to pancreatitis.  Dr. Allen Norris evaluated the patient. Today the patient's abdomen is more distended and pain is worse. LFT's are stable.  Dr. Allen Norris has recommended CT abdomen and pelvis. CBC shows a greatly increased WBC count. Hemoglobin was stable. After stool softeners the patient had 4 BM's yesterday. His daughter is at bedside.  The patient will be going to surgery later today with Dr. [name] for laparoscopic cholecystectomy.  Consultants  . Gastroenterology . General Surgery  Procedures  . None  Antibiotics   Anti-infectives (From admission, onward)   None     Subjective  The patient is awake, alert, and oriented x 3. No acute distress.   Objective   Vitals:  Vitals:   11/04/20 0726 11/04/20 1127  BP: 122/77 110/72  Pulse: 85 74  Resp: 18 16  Temp: 99.6 F (37.6 C) 99.8 F (37.7 C)  SpO2: 96% 98%    Exam:  Constitutional:  . The patient is awake, alert, and oriented x 3. He is in no acute distress. Respiratory:  . No increased work of breathing. . No wheezes, rales, or rhonchi . No tactile fremitus Cardiovascular:  . Regular rate and rhythm . No murmurs, ectopy, or gallups. . No lateral PMI. No thrills. Abdomen:  . Abdomen is less distended, The abdomen is non-tender today.  . No hernias, masses, or organomegaly . Hypoactive bowel sounds.  Musculoskeletal:  . No cyanosis, clubbing, or edema Skin:  . No rashes, lesions, ulcers . palpation of skin: no induration or nodules Neurologic:  . CN 2-12 intact . Sensation all 4 extremities intact Psychiatric:  . Mental status o Mood, affect appropriate o Orientation to person, place, time  . judgment and insight appear intact I have personally reviewed the following:  Today's Data  . Vitals, CMP, CBC  Micro Data  . Blood cultures x 2 obtained.  Imaging  . MRCP . Rt Upper Quadrant Ultrasound  Cardiology Data  . EKG  Scheduled Meds: . docusate sodium  100 mg Oral BID  . levothyroxine  50 mcg Oral Q0600  . ondansetron (ZOFRAN) IV  4 mg Intravenous Q6H  . pantoprazole (PROTONIX) IV  40 mg Intravenous Q24H   Principal Problem:    Syncope Active Problems:   Hypothyroidism   Gastroesophageal reflux disease without esophagitis   Abdominal pain   Dilation of pancreatic duct   Dehydration   Acute pancreatitis   LOS: 4 days   A & P   Syncope:Likely vasovagal complicated by hypovolemia due to poor PO intake for the past couple of days. The patient has received IV fluids. Orthostatics are still pending. Volume status will be carefully monitored. Echocardiogram demonstrates LV EF 60-65% and Grade I diastolic dysfunction. RV systolic function is normal.   Acute pancreatitis with dilation of pancreatic duct on ERCP: Lipase 2209 on admission. 150 today. LFT's are normal. WBC has decreased from 20.4 to 12.4.  The patient states that he feels better.  GI feels that it is likely related to the patient passing a stone recently and the associated inflammation. Will hold crestor as inpatient. He has been treated conservatively with IV fluids, as needed IV analgesia, and allow for bowel rest. GI has recommended general surgery consult to have the patient evaluated for possible cholecystectomy to prevent future possible gallstone pancreatitis. He will go for cholecystectomy today to prevent any further episodes of gallstone pancreatitis. He has been kept NPO after midnight.   Hyponatremia: Mild. NA was 129 yesterday. Improved to 134 this morning with discontinuation of IV fluids.   Dehydration: Resolved.  Diagnosis on the basis of physical exam evidence that includes dry oral mucous membrane, in addition to suggestion of dehydration stemming from urinalysis result, included elevated specific gravity of 1.046.  This appears to be on the basis of recent decline in oral intake of water over the last 2 to 3 days, as further noted above.    GERD: Patient confirms a history of such, and acknowledges good compliance with his home daily Pepcid.  Noted. Continue Protonix. He reports that he previously underwent EGD approximately 10 to 12 years  ago which showed no evidence of acute process, including no evidence of mass, esophagitis, gastritis, or peptic ulcer disease.   Acquired hypothyroidism: Continue synthroid as at home.  Osteoarthritis of the bilateral knees: On daily meloxicam in the absence of any additional NSAIDs at home. Holding NSAIDS at this time.   I have seen and examined this patient myself. I have spent 25 minutes in his evaluation and care.   DVT prophylaxis: scd's  Code Status: Full code Family Communication: Case was discussed with the patient's daughter, who was present at bedside. Disposition Plan:  Status is: Inpatient  Remains inpatient appropriate because:Ongoing diagnostic testing needed not appropriate for outpatient work up  Dispo: The patient is from: Home              Anticipated d/c is to: Home              Patient currently is not medically stable to d/c.   Difficult to place patient No  Ladanian Kelter, DO [facility] Direct contact: see [email or web address]  7PM-7AM contact night coverage as above 11/04/2020, 12:57 PM  LOS: 1 day

## 2020-11-04 NOTE — Interval H&P Note (Signed)
History and Physical Interval Note:  11/04/2020 2:59 PM  Kevin Terrell  has presented today for surgery, with the diagnosis of Gallstones.  The various methods of treatment have been discussed with the patient and family. After consideration of risks, benefits and other options for treatment, the patient has consented to  Procedure(s): XI ROBOTIC [place] (N/A) as a surgical intervention.  The patient's history has been reviewed, patient examined, no change in status, stable for surgery.  I have reviewed the patient's chart and labs.  Questions were answered to the patient's satisfaction.     [name] [name]

## 2020-11-04 NOTE — Evaluation (Signed)
Occupational Therapy Evaluation Patient Details Name: Kevin Terrell MRN: [id number] DOB: February 05, 1941 Today's Date: 11/04/2020    History of Present Illness Kevin Terrell is a 80 y.o. male with medical history significant for lung cancer status post right upper lobectomy in April 2009 and status post chemotherapy reportedly now in remission, GERD, acquired hypothyroidism, osteoarthritis, who is admitted to [facility] on 10/31/2020 for further evaluation of syncope after presenting from home to [facility] ED complaining of episode of loss of consciousness.   MRCP was obtained and demonstrated acute pancreatitis, dilated pancreatic duct, and CBD. There is also a question of obstruction of gallbladder. LFT's were elevated on admissionGI was consulted. GI has recommended consult with General Surgery re possible cholecystectomy to prevent further episodes of gallstone pancreatitis.  Pt and family eager to prevent further recurrence, so will proceed with surgery once echo clearance obtained and Na level normalizes.   Clinical Impression   Pt seen for OT evaluation this date. Upon arrival to room, pt seated upright in chair finishing session with PT. Pt agreeable to OT evaluation and treatment. Prior to admission, pt was independent in all ADLs and functional mobility (without AD/DME), and was living alone in an [place] community [facility]). Pt [place] some assistance [place] for [place] and home maintenance. Pt currently presents with nausea, lightheadedness with OOB mobility, and decreased strength and activity tolerance, and requires MIN GUARD for functional mobility with RW, standing UB bathing/dressing, and standing grooming. Pt verbalized concern regarding safety with showering; plan to demo if appropriate in future sessions. Pt would benefit from additional skilled OT services to address deficits listed above and to maximize return to PLOF and minimize risk  of future falls, injury, caregiver burden, and readmission. Upon discharge, recommend SNF d/t decreased caregiver support at home and pt's concern returning home alone with current deficits.     Follow Up Recommendations  SNF    Equipment Recommendations  Other (comment) (defer to next venue of care)       Precautions / Restrictions Precautions Precautions: Fall Restrictions Weight Bearing Restrictions: No      Mobility Bed Mobility Overal bed mobility: Modified Independent             General bed mobility comments: In chair upon arrival to room    Transfers Overall transfer level: Needs assistance Equipment used: Rolling walker (2 wheeled) Transfers: Sit to/from Stand Sit to Stand: Supervision          Balance Overall balance assessment: Needs assistance   Sitting balance-Leahy Scale: Good Sitting balance - Comments: steady sitting edge of bed   Standing balance support: No upper extremity supported;During functional activity Standing balance-Leahy Scale: Good Standing balance comment: Pt able to complete standing UB bathing/dressing with MIN GUARD d/t pt c/o nausea/light-headedness                           ADL either performed or assessed with clinical judgement   ADL Overall ADL's : Needs assistance/impaired     Grooming: Wash/dry hands;Applying deodorant;Min guard;Standing   Upper Body Bathing: Min guard;Standing       Upper Body Dressing : Min guard;Standing Upper Body Dressing Details (indicate cue type and reason): to don/doff hospital gown                 Functional mobility during ADLs: Min guard;Rolling walker  Pertinent Vitals/Pain Pain Assessment: 0-10 Pain Score: 3  Pain Location: abdomen Pain Intervention(s): Limited activity within patient's tolerance;Monitored during session;Patient requesting pain meds-RN notified        Extremity/Trunk Assessment Upper Extremity Assessment Upper  Extremity Assessment: Overall WFL for tasks assessed   Lower Extremity Assessment Lower Extremity Assessment: Overall WFL for tasks assessed   Cervical / Trunk Assessment Cervical / Trunk Assessment: Normal   Communication Communication Communication: No difficulties   Cognition Arousal/Alertness: Awake/alert Behavior During Therapy: WFL for tasks assessed/performed Overall Cognitive Status: Within Functional Limits for tasks assessed                                 General Comments: Pleasant and agreeable      Exercises Other Exercises Other Exercises: Educated on role of OT, POC, and d/c recommendation        Home Living Family/patient expects to be discharged to:: Private residence Living Arrangements: Alone;Other (Comment) ([place]. Wife lives in the memory care portion of [place]) Available Help at Discharge: Family;Available PRN/intermittently (patient reports family has their own lives) Type of Home:  (Independent living) Home Access: Stairs to enter [employer] of Steps: 1 step Entrance Stairs-Rails: None Home Layout: Two level;Able to live on main level with bedroom/bathroom Alternate Level Stairs-Number of Steps: 12-14 Alternate Level Stairs-Rails: Left;Right;Can reach both Bathroom Shower/Tub: Tub/shower unit         Home Equipment: None   Additional Comments: No hand held shower head      Prior Functioning/Environment Level of Independence: Independent        Comments: Pt reports no falls in last 6 months. Pt reports being independent with ADLs and receiving some assistance from independent living community for cooking and home maintenance        OT Problem List: Decreased strength;Decreased activity tolerance;Impaired balance (sitting and/or standing)      OT Treatment/Interventions: Self-care/ADL training;Therapeutic exercise;Energy conservation;DME and/or AE instruction;Therapeutic  activities;Patient/family education;Balance training    OT Goals(Current goals can be found in the care plan section) Acute Rehab OT Goals Patient Stated Goal: To go to rehab prior to returning home OT Goal Formulation: With patient Time For Goal Achievement: 11/18/20 Potential to Achieve Goals: Good ADL Goals Pt Will Perform Grooming: with modified independence;standing Pt Will Perform Lower Body Bathing: with modified independence;sit to/from stand Pt Will Perform Tub/Shower Transfer: with modified independence;ambulating  OT Frequency: Min 1X/week    AM-PAC OT "6 Clicks" Daily Activity     Outcome Measure Help from another person eating meals?: None Help from another person taking care of personal grooming?: A Little Help from another person toileting, which includes using toliet, bedpan, or urinal?: A Little Help from another person bathing (including washing, rinsing, drying)?: A Little Help from another person to put on and taking off regular upper body clothing?: A Little Help from another person to put on and taking off regular lower body clothing?: A Little 6 Click Score: 19   End of Session Equipment Utilized During Treatment: Gait belt;Rolling walker Nurse Communication: Mobility status  Activity Tolerance: Patient tolerated treatment well Patient left: in chair;with call bell/phone within reach;with chair alarm set  OT Visit Diagnosis: Unsteadiness on feet (R26.81);History of falling (Z91.81)                Time: 3267-1245 OT Time Calculation (min): 32 min Charges:  OT General Charges $OT Visit: 1 Visit  OT Evaluation $OT Eval Moderate Complexity: 1 Mod OT Treatments $Self Care/Home Management : 23-37 mins  Fredirick Maudlin, OTR/L [place]

## 2020-11-04 NOTE — Progress Notes (Signed)
Pt confused at this time. Dr. Rosey Bath notified. States confusion due to anesthesia should resolve slowly. Pt ok to go back to his room at this time.

## 2020-11-04 NOTE — Anesthesia Procedure Notes (Signed)
Procedure Name: Intubation Performed by: Rolla Plate, CRNA Pre-anesthesia Checklist: Patient identified, Patient being monitored, Timeout performed, Emergency Drugs available and Suction available Patient Re-evaluated:Patient Re-evaluated prior to induction Oxygen Delivery Method: Circle system utilized Preoxygenation: Pre-oxygenation with 100% oxygen Induction Type: IV induction and Rapid sequence Laryngoscope Size: McGraph and 4 Grade View: Grade I Tube type: Oral Tube size: 7.5 mm Number of attempts: 1 Airway Equipment and Method: Stylet and Video-laryngoscopy Placement Confirmation: ETT inserted through vocal cords under direct vision,  positive ETCO2 and breath sounds checked- equal and bilateral Secured at: 22 cm Tube secured with: Tape Dental Injury: Teeth and Oropharynx as per pre-operative assessment

## 2020-11-04 NOTE — Anesthesia Preprocedure Evaluation (Signed)
Anesthesia Evaluation  Patient identified by MRN, date of birth, ID band Patient awake    Reviewed: Allergy & Precautions, NPO status , Patient's Chart, lab work & pertinent test results  History of Anesthesia Complications Negative for: history of anesthetic complications  Airway Mallampati: II  TM Distance: >3 FB Neck ROM: Full    Dental  (+) Teeth Intact, Dental Advidsory Given   Pulmonary neg pulmonary ROS, neg shortness of breath, neg sleep apnea, neg COPD, neg recent URI, Patient abstained from smoking.Not current smoker,  S/p R lobectomy for lung cancer   Pulmonary exam normal breath sounds clear to auscultation       Cardiovascular Exercise Tolerance: Good METS(-) hypertension(-) angina(-) CAD and (-) Past MI negative cardio ROS  (-) dysrhythmias  Rhythm:Regular Rate:Normal - Systolic murmurs    Neuro/Psych neg Seizures negative neurological ROS  negative psych ROS   GI/Hepatic GERD  Controlled,(+)     (-) substance abuse  ,   Endo/Other  neg diabetesHypothyroidism   Renal/GU negative Renal ROS     Musculoskeletal   Abdominal   Peds  Hematology   Anesthesia Other Findings Past Medical History: No date: Actinic keratosis No date: Arthritis No date: GERD (gastroesophageal reflux disease) No date: Hypothyroidism 2009: Lung cancer ([place])     Comment:  chemo, NO radiation. treated in [place] No date: Seborrheic dermatitis No date: Thyroid disease     Comment:  hypothyroid   Reproductive/Obstetrics                             Anesthesia Physical  Anesthesia Plan  ASA: II  Anesthesia Plan: General   Post-op Pain Management:    Induction: Intravenous  PONV Risk Score and Plan: 2 and Ondansetron, Dexamethasone and Treatment may vary due to age or medical condition  Airway Management Planned: Oral ETT  Additional Equipment: None  Intra-op Plan:    Post-operative Plan: Extubation in OR  Informed Consent: I have reviewed the patients History and Physical, chart, labs and discussed the procedure including the risks, benefits and alternatives for the proposed anesthesia with the patient or authorized representative who has indicated his/her understanding and acceptance.     Dental advisory given  Plan Discussed with: CRNA and Surgeon  Anesthesia Plan Comments: (Discussed risks of anesthesia with patient, including possibility of difficulty with spontaneous ventilation under anesthesia necessitating airway intervention, PONV, and rare risks such as cardiac or respiratory or neurological events. Patient understands.)        Anesthesia Quick Evaluation

## 2020-11-04 NOTE — Transfer of Care (Signed)
Immediate Anesthesia Transfer of Care Note  Patient: Kevin Terrell  Procedure(s) Performed: XI ROBOTIC ASSISTED LAPAROSCOPIC CHOLECYSTECTOMY (N/A )  Patient Location: PACU  Anesthesia Type:General  Level of Consciousness: sedated  Airway & Oxygen Therapy: Patient Spontanous Breathing and Patient connected to face mask oxygen  Post-op Assessment: Report given to RN and Post -op Vital signs reviewed and stable  Post vital signs: Reviewed  Last Vitals:  Vitals Value Taken Time  BP    Temp    Pulse    Resp    SpO2      Last Pain:  Vitals:   11/04/20 1518  TempSrc: Oral  PainSc:       Patients Stated Pain Goal: 3 (50/15/86 8257)  Complications: No complications documented.

## 2020-11-05 ENCOUNTER — Telehealth: Payer: Self-pay | Admitting: *Deleted

## 2020-11-05 ENCOUNTER — Inpatient Hospital Stay: Payer: Medicare Other

## 2020-11-05 DIAGNOSIS — D72819 Decreased white blood cell count, unspecified: Secondary | ICD-10-CM

## 2020-11-05 DIAGNOSIS — K859 Acute pancreatitis without necrosis or infection, unspecified: Secondary | ICD-10-CM | POA: Diagnosis not present

## 2020-11-05 DIAGNOSIS — K851 Biliary acute pancreatitis without necrosis or infection: Principal | ICD-10-CM

## 2020-11-05 DIAGNOSIS — K659 Peritonitis, unspecified: Secondary | ICD-10-CM

## 2020-11-05 LAB — BASIC METABOLIC PANEL
Anion gap: 8 (ref 5–15)
BUN: 18 mg/dL (ref 8–23)
CO2: 26 mmol/L (ref 22–32)
Calcium: 8 mg/dL — ABNORMAL LOW (ref 8.9–10.3)
Chloride: 101 mmol/L (ref 98–111)
Creatinine, Ser: 0.68 mg/dL (ref 0.61–1.24)
GFR, Estimated: >60 mL/min (ref >60)
Glucose, Bld: 127 mg/dL — ABNORMAL HIGH (ref 70–99)
Potassium: 3.8 mmol/L (ref 3.5–5.1)
Sodium: 135 mmol/L (ref 135–145)

## 2020-11-05 LAB — CBC WITH DIFFERENTIAL/PLATELET
Abs Immature Granulocytes: 0.08 10*3/uL — ABNORMAL HIGH (ref 0.00–0.07)
Basophils Absolute: 0 10*3/uL (ref 0.0–0.1)
Basophils Relative: 0 %
Eosinophils Absolute: 0 10*3/uL (ref 0.0–0.5)
Eosinophils Relative: 0 %
HCT: 31.3 % — ABNORMAL LOW (ref 39.0–52.0)
Hemoglobin: 11 g/dL — ABNORMAL LOW (ref 13.0–17.0)
Immature Granulocytes: 1 %
Lymphocytes Relative: 3 %
Lymphs Abs: 0.4 10*3/uL — ABNORMAL LOW (ref 0.7–4.0)
MCH: 31.5 pg (ref 26.0–34.0)
MCHC: 35.1 g/dL (ref 30.0–36.0)
MCV: 89.7 fL (ref 80.0–100.0)
Monocytes Absolute: 0.3 10*3/uL (ref 0.1–1.0)
Monocytes Relative: 3 %
Neutro Abs: 10 10*3/uL — ABNORMAL HIGH (ref 1.7–7.7)
Neutrophils Relative %: 93 %
Platelets: 138 10*3/uL — ABNORMAL LOW (ref 150–400)
RBC: 3.49 MIL/uL — ABNORMAL LOW (ref 4.22–5.81)
RDW: 12.2 % (ref 11.5–15.5)
WBC: 10.8 10*3/uL — ABNORMAL HIGH (ref 4.0–10.5)
nRBC: 0 % (ref 0.0–0.2)

## 2020-11-05 LAB — BODY FLUID CELL COUNT WITH DIFFERENTIAL
Eos, Fluid: 0 %
Lymphs, Fluid: 0 %
Monocyte-Macrophage-Serous Fluid: 8 % — ABNORMAL LOW (ref 50–90)
Neutrophil Count, Fluid: 92 % — ABNORMAL HIGH (ref 0–25)
Total Nucleated Cell Count, Fluid: 13000 cu mm — ABNORMAL HIGH (ref 0–1000)

## 2020-11-05 LAB — ALBUMIN, PLEURAL OR PERITONEAL FLUID: Albumin, Fluid: 2.3 g/dL

## 2020-11-05 IMAGING — US US HEPATIC LIVER DOPPLER
1 series · 13 of 25 positions shown · non-contrast
Comparison: CT abdomen pelvis with contrast [DATE]

MRI abdomen [DATE]

CLINICAL DATA: Hemangioma?

EXAM:
DUPLEX ULTRASOUND OF LIVER
TECHNIQUE: Color and duplex Doppler ultrasound was performed to evaluate the
hepatic in-flow and out-flow vessels.

[Series 1: us liver doppler · 13 of 60 slices shown]
[im 1/60]
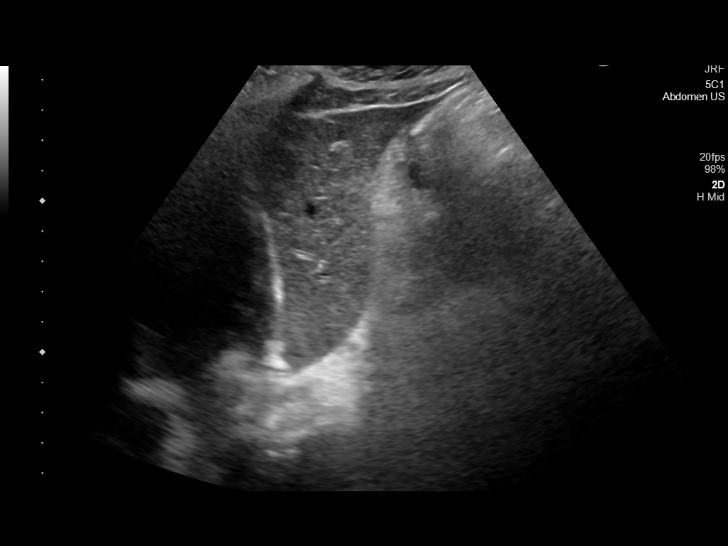
[im 5/60]
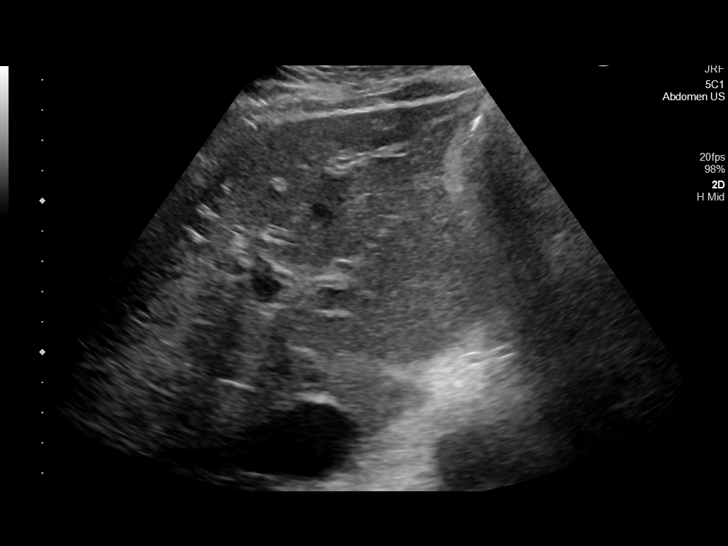
[im 10/60]
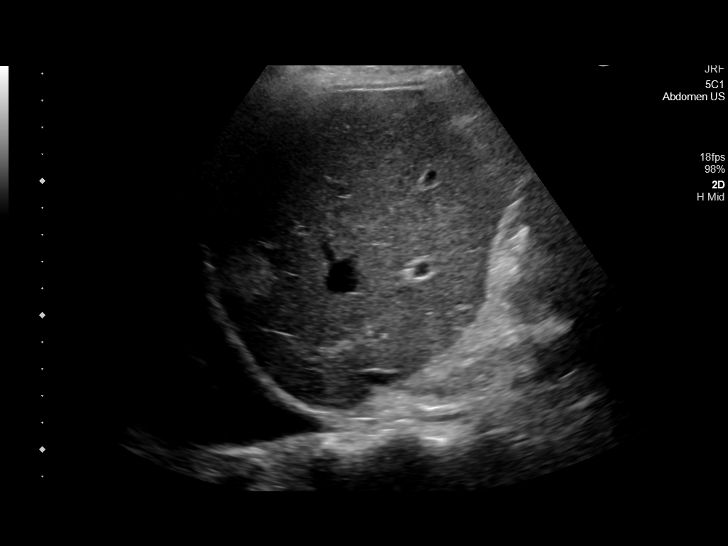
[im 15/60]
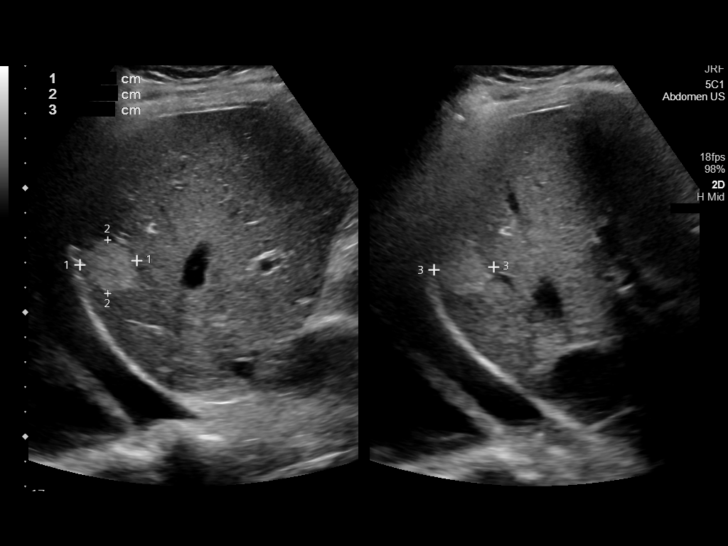
[im 20/60]
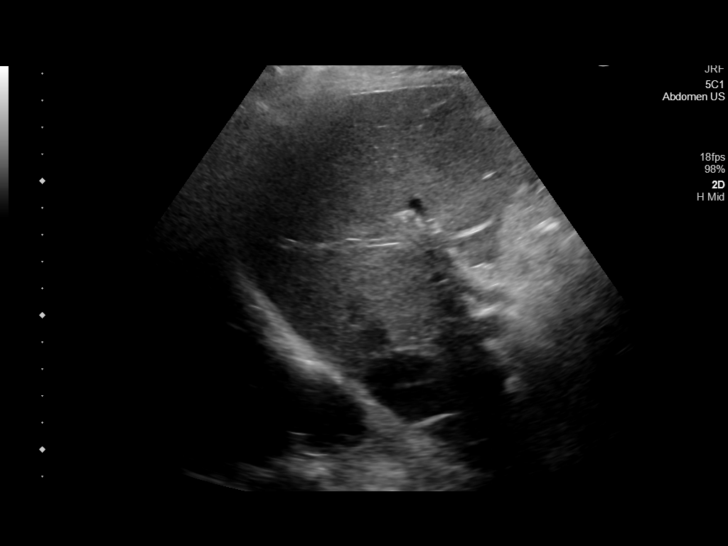
[im 25/60]
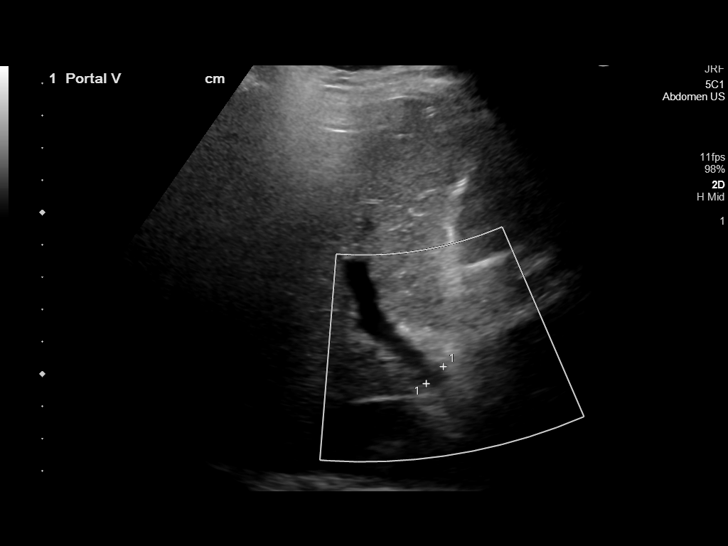
[im 30/60]
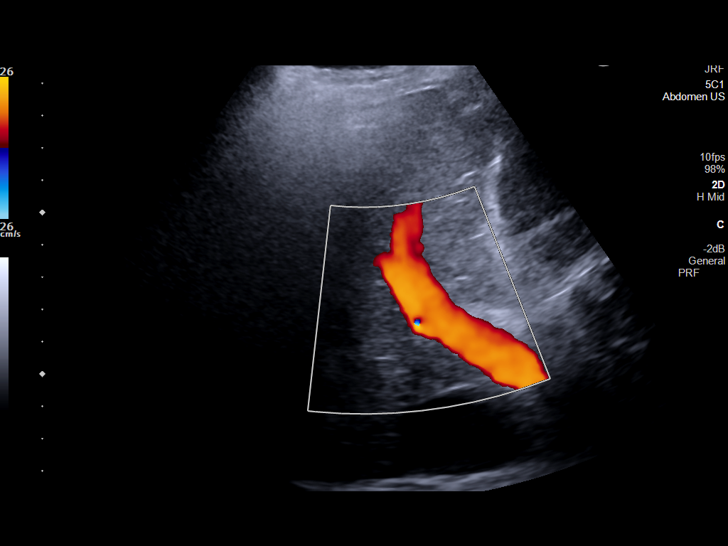
[im 35/60]
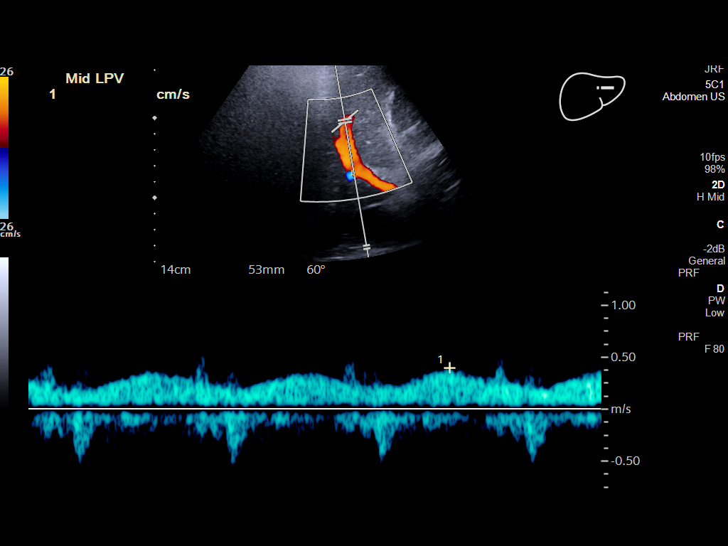
[im 40/60]
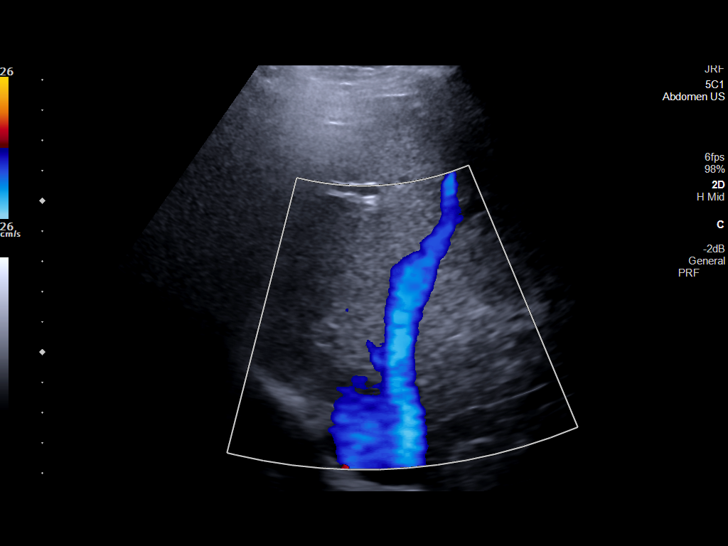
[im 45/60]
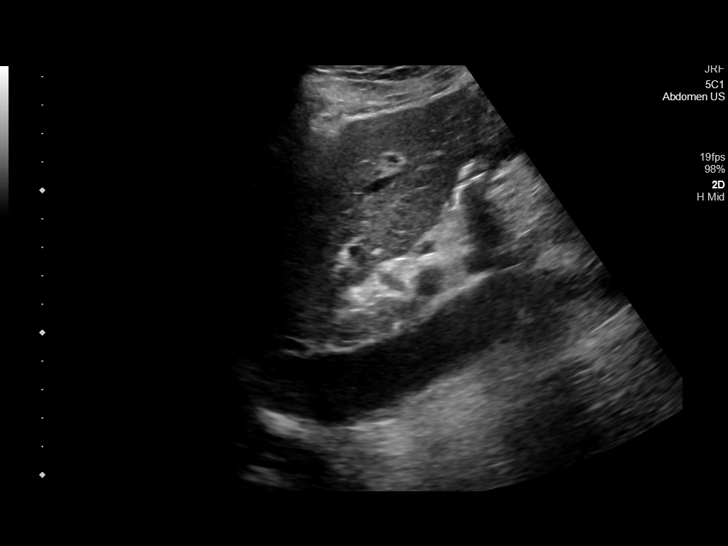
[im 50/60]
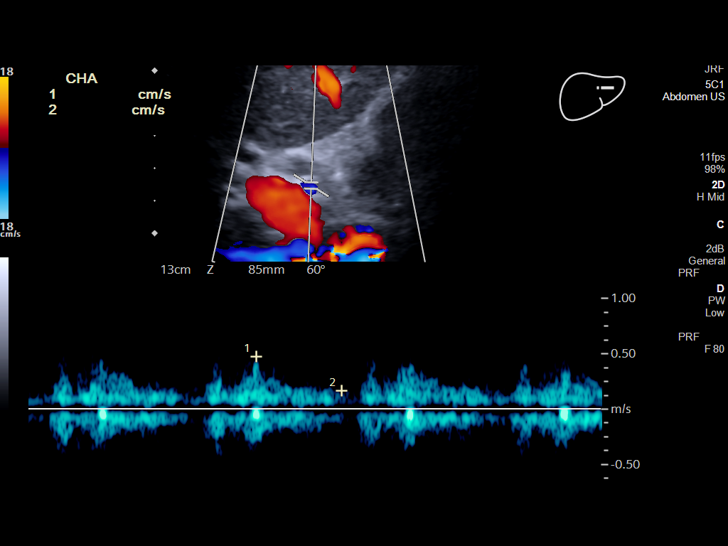
[im 55/60]
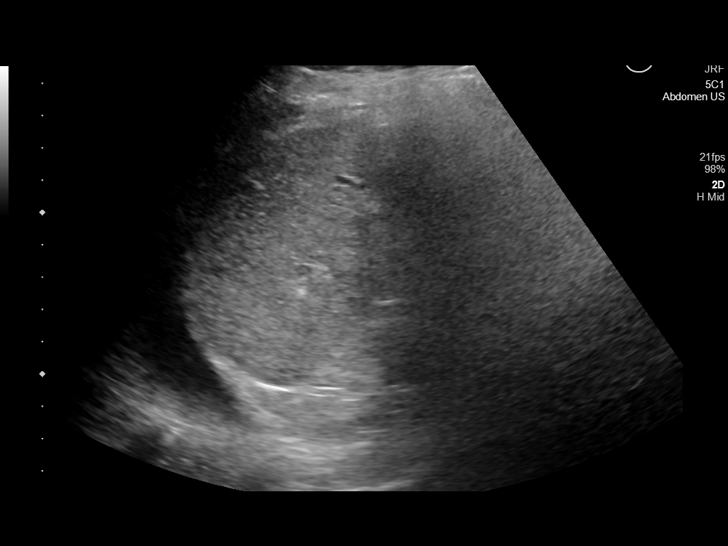
[im 60/60]
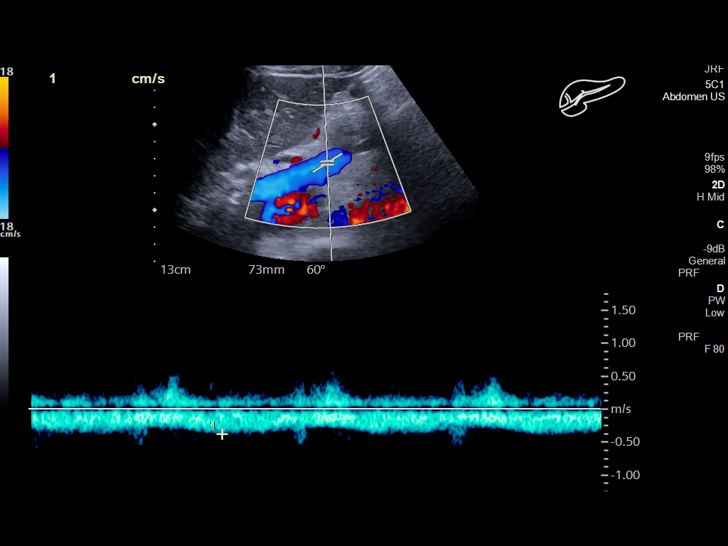

[13 of 25 positions shown; findings below may reference images not displayed]

FINDINGS: Liver: Normal parenchymal echogenicity. Normal hepatic contour
without nodularity.

2.4 x 2.3 x 2.2 cm echogenic lesion seen in the dome of the right
hepatic lobe consistent with hemangioma seen on recent MRI. This
lesion is better assessed on the recent MRI.

Main Portal Vein size: 0.8 cm

Portal Vein Velocities

Main Prox:  49 cm/sec

Main Mid: 50 cm/sec

Main Dist:  52 cm/sec
Right: 27 cm/sec
Left: 40 cm/sec

Hepatic Vein Velocities

Right:  32 cm/sec

Middle:  27 cm/sec

Left:  32 cm/sec

IVC: Present and patent with normal respiratory phasicity.

Hepatic Artery Velocity:  48 cm/sec

Splenic Vein Velocity:  27 cm/sec

Spleen: 6.6 cm x 3.4 cm x 9.8 cm with a total volume of 115 cm^3
(411 cm^3 is upper limit normal)

Portal Vein Occlusion/Thrombus: No

Splenic Vein Occlusion/Thrombus: No

Ascites: None

Varices: None

Small bilateral pleural effusions partially visualized.
IMPRESSION: Echogenic lesion in the dome of the right hepatic lobe measuring
x 2.2 x 2.3 cm is consistent with a hemangioma given appearance on
recent MRI.

## 2020-11-05 NOTE — NC FL2 (Signed)
[place] LEVEL OF CARE SCREENING TOOL     IDENTIFICATION  Patient Name: Kevin Terrell Birthdate: 04-25-1941 Sex: male Admission Date (Current Location): 10/31/2020  [place] and [place] Number:  Engineering geologist and Address:  [facility], [address], [place], [place] [address]      Provider Number: [id number]  Attending Physician Name and Address:  Sharen Hones, MD  Relative Name and Phone Number:       Current Level of Care: Hospital Recommended Level of Care: [place] Prior Approval Number:    Date Approved/Denied:   PASRR Number: [id number]  Discharge Plan: SNF    Current Diagnoses: Patient Active Problem List   Diagnosis Date Noted  . Peritonitis ([place]) 11/05/2020  . Abdominal pain 11/01/2020  . Dilation of pancreatic duct 11/01/2020  . Dehydration 11/01/2020  . Acute pancreatitis   . Syncope 10/31/2020  . 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 Candidal balanitis 04/27/2020  . Aortic atherosclerosis ([place]) 02/02/2020  . Encounter for screening colonoscopy   . HLD (hyperlipidemia) 10/28/2019  . Screening for cardiovascular, respiratory, and genitourinary diseases 10/28/2019  . Vitamin D deficiency 08/24/2019  . Encounter to establish care 08/24/2019  . Osteoarthritis 01/26/2019  . Hx of cancer of lung 08/22/2018  . 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 Seasonal allergies 05/18/2018  . Hypothyroidism 05/18/2018  . Gastroesophageal reflux disease without esophagitis 05/18/2018  . 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 Chronic midline low back pain 05/18/2018  . Low serum vitamin B12 10/07/2016  . Pain in left knee 10/07/2016  . 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 Bone pain 07/07/2016    Orientation RESPIRATION BLADDER Height & Weight     Self,Time,Situation,Place  Normal Continent Weight: 190 lb 0.6 oz (86.2 kg) Height:  6\' 1"  (185.4 cm)  BEHAVIORAL  SYMPTOMS/MOOD NEUROLOGICAL BOWEL NUTRITION STATUS     (None) Continent Diet (Clear liquids)  AMBULATORY STATUS COMMUNICATION OF NEEDS Skin   Supervision Verbally Surgical wounds                       Personal Care Assistance Level of Assistance  Bathing,Feeding,Dressing Bathing Assistance: Limited assistance Feeding assistance: Limited assistance Dressing Assistance: Limited assistance     Functional Limitations Info  Sight,Hearing,Speech Sight Info: Adequate Hearing Info: Adequate Speech Info: Adequate    SPECIAL CARE FACTORS FREQUENCY  PT (By licensed PT),OT (By licensed OT)     PT Frequency: 5 x week OT Frequency: 5 x week            Contractures Contractures Info: Not present    Additional Factors Info  Code Status,Allergies Code Status Info: Full code Allergies Info: Compazine (Prochlorperazine Edisylate), Okra           Current Medications (11/05/2020):  This is the current hospital active medication list Current Facility-Administered Medications  Medication Dose Route Frequency Provider Last Rate Last Admin  . acetaminophen (TYLENOL) tablet 650 mg  650 mg Oral Q6H PRN Sakai, Isami, DO   650 mg at 11/03/20 1216  . docusate sodium (COLACE) capsule 100 mg  100 mg Oral BID Sakai, Isami, DO   100 mg at 11/05/20 1130  . enoxaparin (LOVENOX) injection 40 mg  40 mg Subcutaneous Q24H Sakai, Isami, DO   40 mg at 11/05/20 1126  . fluticasone (FLONASE) 50 MCG/ACT nasal spray 1 spray  1 spray Each Nare BID Sakai, Isami, DO   1 spray  at 11/05/20 1128  . gabapentin (NEURONTIN) capsule 300 mg  300 mg Oral BID Sakai, Isami, DO   300 mg at 11/05/20 1126  . HYDROcodone-acetaminophen (NORCO/VICODIN) 5-325 MG per tablet 1-2 tablet  1-2 tablet Oral Q6H PRN [name], Isami, DO   2 tablet at 11/05/20 1127  . levothyroxine (SYNTHROID) tablet 50 mcg  50 mcg Oral Q0600 Sakai, Isami, DO   50 mcg at 11/05/20 0515  . loratadine (CLARITIN) tablet 10 mg  10 mg Oral Daily Sakai, Isami, DO    10 mg at 11/05/20 1126  . LORazepam (ATIVAN) tablet 0.5 mg  0.5 mg Oral Once PRN Sakai, Isami, DO      . meloxicam (MOBIC) tablet 15 mg  15 mg Oral Daily Sakai, Isami, DO   15 mg at 11/05/20 1126  . metoCLOPramide (REGLAN) injection 10 mg  10 mg Intravenous Q4H PRN Sakai, Isami, DO   10 mg at 11/03/20 0214  . montelukast (SINGULAIR) tablet 10 mg  10 mg Oral QHS Sakai, Isami, DO   10 mg at 11/04/20 2304  . ondansetron (ZOFRAN) injection 4 mg  4 mg Intravenous Q6H PRN Sakai, Isami, DO   4 mg at 11/02/20 1016  . ondansetron (ZOFRAN) injection 4 mg  4 mg Intravenous Q6H Sakai, Isami, DO   4 mg at 11/05/20 1130  . pantoprazole (PROTONIX) injection 40 mg  40 mg Intravenous Q24H Sakai, Isami, DO   40 mg at 11/05/20 1126     Discharge Medications: Please see discharge summary for a list of discharge medications.  Relevant Imaging Results:  Relevant Lab Results:   Additional Information SS#: [id number]  Candie Chroman, LCSW

## 2020-11-05 NOTE — Telephone Encounter (Signed)
  I am not familiar with the patient.  I think Dr Grayland Ormond is on call.

## 2020-11-05 NOTE — Progress Notes (Signed)
Patient returns to room from OR Recovery. Stable condition in no apparent distress. Alert and reorienting easily from anesthesia medications.

## 2020-11-05 NOTE — Telephone Encounter (Signed)
Daughter called reporting that patient has been in hospital since Wednesday and had a lot of tests done, She is asking if that will suffice what Dr Janese Banks wants for his appointment with her on 3/15. She also reports that his CT came back abn and a PET has been recommended as out patient and she is asking if Dr Janese Banks will order that and if it needs to be done before his 3/15 appointment. He is supposed to be discharged very soon.  IMPRESSION: 1. Acute pancreatitis with interval increase in moderate volume simple free fluid ascites. No pseudocyst formation. 2. 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 Recommend PET-CT for further evaluation. 3. Sigmoid diverticulosis with no acute diverticulitis. 4.  Aortic Atherosclerosis (ICD10-I70.0).   Electronically Signed   By: Iven Finn M.D.   On: 11/02/2020 16:49

## 2020-11-05 NOTE — Telephone Encounter (Signed)
Patient is followed by the ct screening program. This CT was ordered as part of his inpatient work up for abdominal pain. The ordering physician will follow up on any recommendations including PET scan. Inpatient providers will consult oncology if needed.

## 2020-11-05 NOTE — Anesthesia Postprocedure Evaluation (Signed)
Anesthesia Post Note  Patient: Kevin Terrell  Procedure(s) Performed: XI ROBOTIC ASSISTED LAPAROSCOPIC CHOLECYSTECTOMY (N/A )  Patient location during evaluation: PACU Anesthesia Type: General Level of consciousness: awake and alert Pain management: pain level controlled Vital Signs Assessment: post-procedure vital signs reviewed and stable Respiratory status: spontaneous breathing, nonlabored ventilation, respiratory function stable and patient connected to nasal cannula oxygen Cardiovascular status: blood pressure returned to baseline and stable Postop Assessment: no apparent nausea or vomiting Anesthetic complications: no   No complications documented.   Last Vitals:  Vitals:   11/04/20 2215 11/04/20 2233  BP: 132/75 139/80  Pulse: 93 86  Resp: 13 16  Temp: 36.5 C 36.7 C  SpO2: 94% 98%    Last Pain:  Vitals:   11/04/20 2233  TempSrc: Oral  PainSc:                  Martha Clan

## 2020-11-05 NOTE — Plan of Care (Signed)
  Problem: Activity: Goal: Risk for activity intolerance will decrease Outcome: Progressing   Problem: Nutrition: Goal: Adequate nutrition will be maintained Outcome: Progressing   Problem: Elimination: Goal: Will not experience complications related to bowel motility Outcome: Progressing Goal: Will not experience complications related to urinary retention Outcome: Progressing   Problem: Pain Managment: Goal: General experience of comfort will improve Outcome: Progressing   Problem: Safety: Goal: Ability to remain free from injury will improve Outcome: Progressing   Problem: Activity: Goal: Risk for activity intolerance will decrease Outcome: Progressing   Problem: Nutrition: Goal: Adequate nutrition will be maintained Outcome: Progressing   Problem: Coping: Goal: Level of anxiety will decrease Outcome: Progressing   Problem: Education: Goal: Knowledge of General Education information will improve Description: Including pain rating scale, medication(s)/side effects and non-pharmacologic comfort measures Outcome: Adequate for Discharge

## 2020-11-05 NOTE — Consult Note (Signed)
NAME: Kevin Terrell  DOB: July 01, 1941  MRN: [id number]  Date/Time: 11/05/2020 11:13 AM  REQUESTING PROVIDER: Dr. Roosevelt Locks Subjective:  REASON FOR CONSULT: Cholelithiasis and pancreatitis ? 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 He did not have any fever He had some nausea Vitals in the ED BP 135/76, heart rate 65, respiratory rate 19, temperature 97.8. On examination he had abdominal tenderness predominantly all over. Labs revealed a WBC of 10.6, platelet 146, total bili 1.3, creatinine was one. CT abdomen showed extensive ill-defined/hazy density throughout the periaortic retroperitoneum with envelopment of both aorta and portions of the inferior vena cava and with extension anteriorly about the superior mesenteric vessels and proximal branches and containing scattered mildly prominent lymph nodes. This was new compared to this chest CT done 11/28/2019. The pancreatic duct was dilated to approximately 6 mm. A 2 cm hypodense lesion was present in the posterior superior right lower lobe indeterminate but more suggestive of a benign hemangioma. Gallbladder on CT appeared normal. On 11/02/2020 the white count increased to 20.7. Surgery was consulted and it was thought that his symptoms were due to pancreatitis secondary to gallstone in origin. On 11/04/2020 he underwent robotic assisted laparoscopic cholecystectomy. There was purulent looking fluid throughout the abdomen seen. Past Medical History:  Diagnosis Date  . Actinic keratosis   . Arthritis   . 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 GERD (gastroesophageal reflux disease)    with atypical cough  . Hypothyroidism   . Lung cancer ([place]) 2009   chemo, NO radiation. treated in [place]  . Seborrheic dermatitis     Past Surgical History:   Procedure Laterality Date  . COLONOSCOPY    . COLONOSCOPY WITH PROPOFOL N/A 11/30/2019   Procedure: COLONOSCOPY WITH PROPOFOL;  Surgeon: Lin Landsman, MD;  Location: [facility] ENDOSCOPY;  Service: Gastroenterology;  Laterality: N/A;  . EYE SURGERY     vitreous detachment---laser  . KNEE CARTILAGE SURGERY Right 1968  . KNEE SURGERY     bilateral meniscal surgeries  . LUNG LOBECTOMY     upper right lung 12/15/2007   . 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 UPPER GI ENDOSCOPY      Social History   Socioeconomic History  . Marital status: Married    Spouse name: Not on file  . Number of children: 3  . Years of education: Not on file  . Highest education level: Not on file  Occupational History  . Occupation: Animal nutritionist    Comment: retired  Tobacco Use  . Smoking status: Never Smoker  . Smokeless tobacco: Never Used  Vaping Use  . Vaping Use: Never used  Substance and Sexual Activity  . Alcohol use: Yes    Comment: Once glass of wine per month, none last 24hrs  . Drug use: Never  . Sexual activity: Yes  Other Topics Concern  . Not on file  Social History Narrative   Married   Animal nutritionist   Wife is nurse    Has a daughter in [place]; son and daughter in [place]    6 grandchildren   [place] from [place]; spent some time in [place]; moved here 2019.    Lives at [facility]      Has advanced directives   Daughter [name] is health care POA  Would accept resuscitation attempts   No feeding tube if cognitively unaware   Social Determinants of Health   Financial Resource Strain: Not on file  Food Insecurity: Not on file  Transportation Needs: Not on file  Physical Activity: Not on file  Stress: Not on file  Social Connections: Not on file  Intimate Partner Violence: Not on file    Family History  Problem Relation Age of Onset  . Cancer Mother   . Alzheimer's disease Mother   . Stroke Maternal Grandmother   . Hypertension Father   . Alzheimer's disease Father     Allergies  Allergen Reactions  . Compazine [Prochlorperazine Edisylate]     muscle spasmus   . Other     Okra - hives   I? Current Facility-Administered Medications  Medication Dose Route Frequency Provider Last Rate Last Admin  . acetaminophen (TYLENOL) tablet 650 mg  650 mg Oral Q6H PRN Sakai, Isami, DO   650 mg at 11/03/20 1216  . docusate sodium (COLACE) capsule 100 mg  100 mg Oral BID [name], Isami, DO   100 mg at 11/03/20 2119  . enoxaparin (LOVENOX) injection 40 mg  40 mg Subcutaneous Q24H Sakai, Isami, DO      . fluticasone (FLONASE) 50 MCG/ACT nasal spray 1 spray  1 spray Each Nare BID Sakai, Isami, DO      . gabapentin (NEURONTIN) capsule 300 mg  300 mg Oral BID Sakai, Isami, DO   300 mg at 11/04/20 2303  . HYDROcodone-acetaminophen (NORCO/VICODIN) 5-325 MG per tablet 1-2 tablet  1-2 tablet Oral Q6H PRN [name], Isami, DO   2 tablet at 11/05/20 0402  . levothyroxine (SYNTHROID) tablet 50 mcg  50 mcg Oral Q0600 Sakai, Isami, DO   50 mcg at 11/05/20 0515  . loratadine (CLARITIN) tablet 10 mg  10 mg Oral Daily Sakai, Isami, DO      . LORazepam (ATIVAN) tablet 0.5 mg  0.5 mg Oral Once PRN Sakai, Isami, DO      . meloxicam (MOBIC) tablet 15 mg  15 mg Oral Daily Sakai, Isami, DO      . metoCLOPramide (REGLAN) injection 10 mg  10 mg Intravenous Q4H PRN Sakai, Isami, DO   10 mg at 11/03/20 0214  . montelukast (SINGULAIR) tablet 10 mg  10 mg Oral QHS Sakai, Isami, DO   10 mg at 11/04/20 2304  . ondansetron (ZOFRAN) injection 4 mg  4 mg Intravenous Q6H PRN Sakai, Isami, DO   4 mg at 11/02/20 1016  . ondansetron (ZOFRAN) injection 4 mg  4 mg Intravenous Q6H Sakai, Isami, DO   4 mg at 11/05/20 0402  . pantoprazole (PROTONIX) injection 40 mg  40 mg Intravenous Q24H Sakai, Isami, DO   40 mg at 11/04/20 0808     Abtx:  Anti-infectives (From admission, onward)   None      REVIEW OF SYSTEMS:  Const: negative fever, negative chills, negative weight loss Eyes: negative diplopia or visual  changes, negative eye pain ENT: negative coryza, negative sore throat Resp: negative cough, hemoptysis, dyspnea Cards: negative for chest pain, palpitations, lower extremity edema GU: negative for frequency, dysuria and hematuria GI: as above Skin: negative for rash and pruritus Heme: negative for easy bruising and gum/nose bleeding MS: negative for myalgias, arthralgias, back pain and muscle weakness Neurolo:negative for headaches, dizziness, vertigo, memory problems  Psych: negative for feelings of anxiety, depression  Endocrine: negative for thyroid, diabetes Allergy/Immunology- as above Objective:  VITALS:  BP 128/66 (BP Location:  Left Arm)   Pulse 66   Temp 98.5 F (36.9 C)   Resp 15   Ht _0  (1.854 m)   Wt 86.2 kg   SpO2 97%   BMI 25.07 kg/m  PHYSICAL EXAM:  General: Alert, cooperative, no distress, appears young for  age.  Head: Normocephalic, without obvious abnormality, atraumatic. Eyes: Conjunctivae clear, anicteric sclerae. Pupils are equal ENT Nares normal. No drainage or sinus tenderness. Lips, mucosa, and tongue normal. No Thrush Neck: Supple, symmetrical, no adenopathy, thyroid: non tender no carotid bruit and no JVD. Back: No CVA tenderness. Lungs: Clear to auscultation bilaterally. No Wheezing or Rhonchi. No rales. Heart: Regular rate and rhythm, no murmur, rub or gallop. Abdomen: Soft, non-tender,not distended. Bowel sounds normal. No masses, lap sites X 3 Extremities: atraumatic, no cyanosis. No edema. No clubbing Skin: No rashes or lesions. Or bruising Lymph: Cervical, supraclavicular normal. Neurologic: Grossly non-focal Pertinent Labs Lab Results CBC    Component Value Date/Time   WBC 10.8 (H) 11/05/2020 0445   RBC 3.49 (L) 11/05/2020 0445   HGB 11.0 (L) 11/05/2020 0445   HCT 31.3 (L) 11/05/2020 0445   PLT 138 (L) 11/05/2020 0445   MCV 89.7 11/05/2020 0445   MCH 31.5 11/05/2020 0445   MCHC 35.1 11/05/2020 0445   RDW 12.2 11/05/2020 0445    LYMPHSABS 0.4 (L) 11/05/2020 0445   MONOABS 0.3 11/05/2020 0445   EOSABS 0.0 11/05/2020 0445   BASOSABS 0.0 11/05/2020 0445    CMP Latest Ref Rng & Units 11/05/2020 11/04/2020 11/03/2020  Glucose 70 - 99 mg/dL 127(H) 105(H) 91  BUN 8 - 23 mg/dL 18 16 29(H)  Creatinine 0.61 - 1.24 mg/dL 0.68 0.65 0.90  Sodium 135 - 145 mmol/L 135 134(L) 129(L)  Potassium 3.5 - 5.1 mmol/L 3.8 3.6 4.1  Chloride 98 - 111 mmol/L 101 103 96(L)  CO2 22 - 32 mmol/L 26 27 21(L)  Calcium 8.9 - 10.3 mg/dL 8.0(L) 8.0(L) 7.5(L)  Total Protein 6.5 - 8.1 g/dL - - 5.6(L)  Total Bilirubin 0.3 - 1.2 mg/dL - - 0.8  Alkaline Phos 38 - 126 U/L - - 64  AST 15 - 41 U/L - - 21  ALT 0 - 44 U/L - - 11      Microbiology: Recent Results (from the past 240 hour(s))  SARS CORONAVIRUS 2 (TAT 6-24 HRS) Nasopharyngeal Nasopharyngeal Swab     Status: None   Collection Time: 10/31/20 10:00 PM   Specimen: Nasopharyngeal Swab  Result Value Ref Range Status   SARS Coronavirus 2 NEGATIVE NEGATIVE Final    Comment: (NOTE) SARS-CoV-2 target nucleic acids are NOT DETECTED.  The SARS-CoV-2 RNA is generally detectable in upper and lower respiratory specimens during the acute phase of infection. Negative results do not preclude SARS-CoV-2 infection, do not rule out co-infections with other pathogens, and should not be used as the sole basis for treatment or other patient management decisions. Negative results must be combined with clinical observations, patient history, and epidemiological information. The expected result is Negative.  Fact Sheet for Patients: SugarRoll.be  Fact Sheet for Healthcare Providers: https://www.woods-mathews.com/  This test is not yet approved or cleared by the [place] FDA and  has been authorized for detection and/or diagnosis of SARS-CoV-2 by FDA under an Emergency Use Authorization (EUA). This EUA will remain  in effect (meaning this test can be used) for  the duration of the COVID-19 declaration under Se ction 564(b)(1) of the Act, 21 U.S.C. section 360bbb-3(b)(1), unless the authorization  is terminated or revoked sooner.  Performed at [facility], [place] [address], [place], [place] [address]   CULTURE, BLOOD (ROUTINE X 2) w Reflex to ID Panel     Status: None (Preliminary result)   Collection Time: 11/01/20 10:56 AM   Specimen: BLOOD  Result Value Ref Range Status   Specimen Description BLOOD LEFT ANTECUBITAL  Final   Special Requests   Final    BOTTLES DRAWN AEROBIC AND ANAEROBIC Blood Culture results may not be optimal due to an excessive volume of blood received in culture bottles   Culture   Final    NO GROWTH 4 DAYS Performed at [facility], [address] [place], [place] [address]    Report Status PENDING  Incomplete  CULTURE, BLOOD (ROUTINE X 2) w Reflex to ID Panel     Status: None (Preliminary result)   Collection Time: 11/01/20 12:38 PM   Specimen: BLOOD  Result Value Ref Range Status   Specimen Description BLOOD RIGHT ANTECUBITAL  Final   Special Requests   Final    BOTTLES DRAWN AEROBIC AND ANAEROBIC Blood Culture adequate volume   Culture   Final    NO GROWTH 4 DAYS Performed at [facility], [address] [place], [place] [address]    Report Status PENDING  Incomplete  Aerobic/Anaerobic Culture w Gram Stain (surgical/deep wound)     Status: None (Preliminary result)   Collection Time: 11/04/20  8:01 PM   Specimen: PATH Other; Body Fluid  Result Value Ref Range Status   Specimen Description   Final    FLUID abdominal Performed at [facility], [address] [place], [place] [address]    Special Requests   Final    NONE Performed at [facility], [address] [place], [place] [address]    Gram Stain   Final    MODERATE WBC PRESENT,BOTH PMN AND MONONUCLEAR NO ORGANISMS SEEN Performed at [facility], [place] [address] [place],  [address]     Culture PENDING  Incomplete   Report Status PENDING  Incomplete    IMAGING RESULTS:  I have personally reviewed the films ? Impression/Recommendation ?Patient admitted on 10/31/2020 with acute onset abdominal pain and dizziness. Acute pancreatitis Had a high lipase, total bili of 1.3 but normal alkaline phosphatase. S/P robotic cholecystectomy No fever, no leucocytosis, no pain abdomen now- so will not start any antibiotics  Leukocytosis resolved  Increased lipase has normalized   ? ? ___________________________________________________ Discussed with patient,and his daughter and  requesting provider Note:  This document was prepared using Dragon voice recognition software and may include unintentional dictation errors.

## 2020-11-05 NOTE — Progress Notes (Signed)
Subjective:  CC: Kevin Terrell is a 80 y.o. male  Hospital stay day 5, 1 Day Post-Op robotic lap chole  HPI: No acute issues overnight.   ROS:  General: Denies weight loss, weight gain, fatigue, fevers, chills, and night sweats. Heart: Denies chest pain, palpitations, racing heart, irregular heartbeat, leg pain or swelling, and decreased activity tolerance. Respiratory: Denies breathing difficulty, shortness of breath, wheezing, cough, and sputum. GI: Denies change in appetite, heartburn, nausea, vomiting, constipation, diarrhea, and blood in stool. GU: Denies difficulty urinating, pain with urinating, urgency, frequency, blood in urine.   Objective:   Temp:  [97.5 F (36.4 C)-99.8 F (37.7 C)] 98.5 F (36.9 C) (03/07 0812) Pulse Rate:  [66-93] 66 (03/07 0812) Resp:  [13-23] 15 (03/07 0812) BP: (110-144)/(61-85) 128/66 (03/07 0812) SpO2:  [92 %-100 %] 97 % (03/07 0812)     Height: 6\' 1"  (185.4 cm) Weight: 86.2 kg BMI (Calculated): 25.08   Intake/Output this shift:   Intake/Output Summary (Last 24 hours) at 11/05/2020 1018 Last data filed at 11/05/2020 1012 Gross per 24 hour  Intake 1010 ml  Output 475 ml  Net 535 ml    Constitutional :  alert, cooperative, appears stated age and no distress  Respiratory:  clear to auscultation bilaterally  Cardiovascular:  regular rate and rhythm  Gastrointestinal: soft, non-tender; bowel sounds normal; no masses,  no organomegaly.   Skin: Cool and moist. Incisions c/d/i.  Psychiatric: Normal affect, non-agitated, not confused       LABS:  CMP Latest Ref Rng & Units 11/05/2020 11/04/2020 11/03/2020  Glucose 70 - 99 mg/dL 127(H) 105(H) 91  BUN 8 - 23 mg/dL 18 16 29(H)  Creatinine 0.61 - 1.24 mg/dL 0.68 0.65 0.90  Sodium 135 - 145 mmol/L 135 134(L) 129(L)  Potassium 3.5 - 5.1 mmol/L 3.8 3.6 4.1  Chloride 98 - 111 mmol/L 101 103 96(L)  CO2 22 - 32 mmol/L 26 27 21(L)  Calcium 8.9 - 10.3 mg/dL 8.0(L) 8.0(L) 7.5(L)  Total Protein 6.5 - 8.1  g/dL - - 5.6(L)  Total Bilirubin 0.3 - 1.2 mg/dL - - 0.8  Alkaline Phos 38 - 126 U/L - - 64  AST 15 - 41 U/L - - 21  ALT 0 - 44 U/L - - 11   CBC Latest Ref Rng & Units 11/05/2020 11/04/2020 11/03/2020  WBC 4.0 - 10.5 K/uL 10.8(H) 12.4(H) 20.0(H)  Hemoglobin 13.0 - 17.0 g/dL 11.0(L) 10.7(L) 14.2  Hematocrit 39.0 - 52.0 % 31.3(L) 31.1(L) 42.0  Platelets 150 - 400 K/uL 138(L) 128(L) 145(L)    RADS: n/a Assessment:   S/p robo lap chole for presumed gallstone pancreatitis. Gallbladder did have some surrounding inflammation consistent with gallstone pancreatitis picture, with incidental findings as noted below.  Incidental note of purulent ascites Intra-Op. Pending fluid studies at this time. Patient remains asymptomatic and is recovering as expected from the surgery. I do not believe there is any bowel injury to cause the purulent drainage. Gross description is creamy tan-colored consistent with pus versus chyle or other fluid types. The liver did not look grossly cirrhotic, there is no other obvious abdominal pathology to explain the ascites. Of note, the retroperitoneum could not be visualized.  Discussed the case with GI, primary team. Okay to advance diet as tolerated and discharged from surgery standpoint once work-up for the incidental findings are completed. Surgery will peripherally follow for now. Please call with additional questions or concerns

## 2020-11-05 NOTE — Progress Notes (Signed)
PROGRESS NOTE    Kevin Terrell  GNF:[id number] DOB: 1940-09-20 DOA: 10/31/2020 PCP: [name], MD   Chief complaint.  Syncope. Brief Narrative:  The patient is a 80 yr old man who presented to [facility] ED on 10/31/2020 with complaints of syncopal episode.  He also had a 1 day of acute abdominal pain.  MRCP showed acute pancreatitis with dilated pancreatic duct and CBD.  Patient has been seen by GI, suspect patient had passed a gallstone. To prevent recurrence of pancreatitis, cholecystectomy was performed 3/6. CT and MRI also showed significant lymphadenopathy within the abdomen.  Patient has been seen by oncology, will follow up as outpatient. However, during the cholecystectomy, patient will drain out about 500 mL of purulent ascites.  Culture has sent out.   Assessment & Plan:   Principal Problem:   Syncope Active Problems:   Hypothyroidism   Gastroesophageal reflux disease without esophagitis   Abdominal pain   Dilation of pancreatic duct   Dehydration   Acute pancreatitis  #1.  Likely peritonitis. Intra-abdominal lymphadenopathy. Discussed with Dr. [name], purulent ascites was seen during surgery, it looked white-yellow in color.  Culture sent out, white cell also pending.  Cytology was also sent out. Clinically had improved, I will obtain ID consult to guide treatment. Etiology of possible peritonitis still unclear.  Patient definitely does not have any gallbladder perforation.  He does not have any evidence of bowel perforation. Possibility of ascites due to leaking of lymphatic fluids. We will also obtain liver ultrasound with Doppler to rule out portal vein thrombosis with associated secondary peritonitis.  #2. Acute gallstone pancreatitis. Cholelithiasis. Condition had improved, currently doing well after cholecystectomy.  #3. syncope with dehydration. Hyponatremia. Condition improved.  4.  Hypothyroidism. Continue Synthroid.    DVT prophylaxis:  Lovenox Code Status: Full Family Communication: Wife and daughter updated. Disposition Plan:  .   Status is: Inpatient  Remains inpatient appropriate because:Inpatient level of care appropriate due to severity of illness   Dispo: The patient is from: Home              Anticipated d/c is to: Home              Patient currently is not medically stable to d/c.   Difficult to place patient No        I/O last 3 completed shifts: In: 650 [I.V.:500; IV Piggyback:150] Out: 475 [Urine:475] No intake/output data recorded.     Consultants:   ID, General surgery  Procedures: Cholecystectomy  Antimicrobials: ID consult pending  Subjective: Patient doing well today, no additional abdominal pain or nausea vomiting.  No nausea vomiting, tolerating diet.  Had a bowel movement yesterday. Denies any short of breath or cough. No fever chills per No dysuria hematuria. No headache or dizziness. No chest pain or palpitation  Objective: Vitals:   11/04/20 2233 11/05/20 0000 11/05/20 0424 11/05/20 0812  BP: 139/80 (!) 144/79 133/74 128/66  Pulse: 86 74 73 66  Resp: 16 16 16 15   Temp: 98.1 F (36.7 C)  98 F (36.7 C) 98.5 F (36.9 C)  TempSrc: Oral     SpO2: 98% 98% 99% 97%  Weight:      Height:        Intake/Output Summary (Last 24 hours) at 11/05/2020 1009 Last data filed at 11/05/2020 0334 Gross per 24 hour  Intake 650 ml  Output 475 ml  Net 175 ml   Filed Weights   10/31/20 1744 11/02/20 0500  Weight: 81.6  kg 86.2 kg    Examination:  General exam: Appears calm and comfortable  Respiratory system: Clear to auscultation. Respiratory effort normal. Cardiovascular system: S1 & S2 heard, RRR. No JVD, murmurs, rubs, gallops or clicks. No pedal edema. Gastrointestinal system: Abdomen is nondistended, feels firm but nontender. No organomegaly or masses felt. Normal bowel sounds heard. Central nervous system: Alert and oriented. No focal neurological  deficits. Extremities: Symmetric 5 x 5 power. Skin: No rashes, lesions or ulcers Psychiatry: Judgement and insight appear normal. Mood & affect appropriate.     Data Reviewed: I have personally reviewed following labs and imaging studies  CBC: Recent Labs  Lab 11/01/20 0434 11/02/20 1246 11/03/20 0822 11/04/20 0518 11/05/20 0445  WBC 14.5* 20.7* 20.0* 12.4* 10.8*  NEUTROABS 12.9* 17.7* 16.6* 10.3* 10.0*  HGB 13.6 16.3 14.2 10.7* 11.0*  HCT 40.8 47.2 42.0 31.1* 31.3*  MCV 91.5 90.1 91.3 89.1 89.7  PLT 156 184 145* 128* 962*   Basic Metabolic Panel: Recent Labs  Lab 10/31/20 2041 11/01/20 0434 11/02/20 0702 11/03/20 0822 11/04/20 0518 11/05/20 0445  NA  --  133* 135 129* 134* 135  K  --  4.2 3.8 4.1 3.6 3.8  CL  --  99 95* 96* 103 101  CO2  --  24 27 21* 27 26  GLUCOSE  --  139* 119* 91 105* 127*  BUN  --  19 36* 29* 16 18  CREATININE  --  0.77 1.06 0.90 0.65 0.68  CALCIUM  --  8.0* 7.8* 7.5* 8.0* 8.0*  MG 2.1 2.0  --   --   --   --   PHOS  --  3.8  --   --   --   --    GFR: Estimated Creatinine Clearance: 86 mL/min (by C-G formula based on SCr of 0.68 mg/dL). Liver Function Tests: Recent Labs  Lab 10/31/20 1757 11/01/20 0434 11/02/20 0702 11/03/20 0822  AST 26 24 26 21   ALT 11 12 12 11   ALKPHOS 54 48 58 64  BILITOT 1.3* 0.9 0.6 0.8  PROT 7.1 6.0* 6.2* 5.6*  ALBUMIN 4.2 3.5 3.2* 2.8*   Recent Labs  Lab 10/31/20 2041 11/02/20 0702  LIPASE 2,209* 150*   No results for input(s): AMMONIA in the last 168 hours. Coagulation Profile: Recent Labs  Lab 11/01/20 0434 11/04/20 0518  INR 1.3* 1.4*   Cardiac Enzymes: No results for input(s): CKTOTAL, CKMB, CKMBINDEX, TROPONINI in the last 168 hours. BNP (last 3 results) No results for input(s): PROBNP in the last 8760 hours. HbA1C: No results for input(s): HGBA1C in the last 72 hours. CBG: No results for input(s): GLUCAP in the last 168 hours. Lipid Profile: No results for input(s): CHOL, HDL,  LDLCALC, TRIG, CHOLHDL, LDLDIRECT in the last 72 hours. Thyroid Function Tests: No results for input(s): TSH, T4TOTAL, FREET4, T3FREE, THYROIDAB in the last 72 hours. Anemia Panel: No results for input(s): VITAMINB12, FOLATE, FERRITIN, TIBC, IRON, RETICCTPCT in the last 72 hours. Sepsis Labs: No results for input(s): PROCALCITON, LATICACIDVEN in the last 168 hours.  Recent Results (from the past 240 hour(s))  SARS CORONAVIRUS 2 (TAT 6-24 HRS) Nasopharyngeal Nasopharyngeal Swab     Status: None   Collection Time: 10/31/20 10:00 PM   Specimen: Nasopharyngeal Swab  Result Value Ref Range Status   SARS Coronavirus 2 NEGATIVE NEGATIVE Final    Comment: (NOTE) SARS-CoV-2 target nucleic acids are NOT DETECTED.  The SARS-CoV-2 RNA is generally detectable in upper and lower respiratory specimens  during the acute phase of infection. Negative results do not preclude SARS-CoV-2 infection, do not rule out co-infections with other pathogens, and should not be used as the sole basis for treatment or other patient management decisions. Negative results must be combined with clinical observations, patient history, and epidemiological information. The expected result is Negative.  Fact Sheet for Patients: SugarRoll.be  Fact Sheet for Healthcare Providers: https://www.woods-mathews.com/  This test is not yet approved or cleared by the [place] FDA and  has been authorized for detection and/or diagnosis of SARS-CoV-2 by FDA under an Emergency Use Authorization (EUA). This EUA will remain  in effect (meaning this test can be used) for the duration of the COVID-19 declaration under Se ction 564(b)(1) of the Act, 21 U.S.C. section 360bbb-3(b)(1), unless the authorization is terminated or revoked sooner.  Performed at [facility], [place] [address], [place], [place] [address]   CULTURE, BLOOD (ROUTINE X 2) w Reflex to ID Panel     Status: None  (Preliminary result)   Collection Time: 11/01/20 10:56 AM   Specimen: BLOOD  Result Value Ref Range Status   Specimen Description BLOOD LEFT ANTECUBITAL  Final   Special Requests   Final    BOTTLES DRAWN AEROBIC AND ANAEROBIC Blood Culture results may not be optimal due to an excessive volume of blood received in culture bottles   Culture   Final    NO GROWTH 4 DAYS Performed at [facility], [address], [place], [place] [address]    Report Status PENDING  Incomplete  CULTURE, BLOOD (ROUTINE X 2) w Reflex to ID Panel     Status: None (Preliminary result)   Collection Time: 11/01/20 12:38 PM   Specimen: BLOOD  Result Value Ref Range Status   Specimen Description BLOOD RIGHT ANTECUBITAL  Final   Special Requests   Final    BOTTLES DRAWN AEROBIC AND ANAEROBIC Blood Culture adequate volume   Culture   Final    NO GROWTH 4 DAYS Performed at [facility], [address] [place], [place] [address]    Report Status PENDING  Incomplete  Aerobic/Anaerobic Culture w Gram Stain (surgical/deep wound)     Status: None (Preliminary result)   Collection Time: 11/04/20  8:01 PM   Specimen: PATH Other; Body Fluid  Result Value Ref Range Status   Specimen Description   Final    FLUID abdominal Performed at [facility], [address] [place], [place] [address]    Special Requests   Final    NONE Performed at [facility], [address] [place], [place] [address]    Gram Stain   Final    MODERATE WBC PRESENT,BOTH PMN AND MONONUCLEAR NO ORGANISMS SEEN Performed at [facility], [place] [address], [place],  [address]    Culture PENDING  Incomplete   Report Status PENDING  Incomplete         Radiology Studies: ECHOCARDIOGRAM COMPLETE  Result Date: 11/03/2020    ECHOCARDIOGRAM REPORT   Patient Name:   AUGUSTINO SAVASTANO Date of Exam: 11/03/2020 Medical Rec #:  [id number]       Height:       73.0 in Accession #:    [id number]      Weight:        190.0 lb Date of Birth:  July 22, 1941      BSA:          2.105 m Patient Age:    26 years  BP:           146/69 mmHg Patient Gender: M               HR:           89 bpm. Exam Location:  [facility] Procedure: 2D Echo, Cardiac Doppler and Color Doppler Indications:     Syncope R55  History:         Patient has no prior history of Echocardiogram examinations.  Sonographer:     Alyse Low Roar Referring Phys:  [id number] Karie Kirks Diagnosing Phys: Ida Rogue MD IMPRESSIONS  1. Left ventricular ejection fraction, by estimation, is 60 to 65%. The left ventricle has normal function. The left ventricle has no regional wall motion abnormalities. Left ventricular diastolic parameters are consistent with Grade I diastolic dysfunction (impaired relaxation).  2. Right ventricular systolic function is normal. The right ventricular size is normal. There is normal pulmonary artery systolic pressure. The estimated right ventricular systolic pressure is 58.8 mmHg.  3. The mitral valve is normal in structure. Mild mitral valve regurgitation. FINDINGS  Left Ventricle: Left ventricular ejection fraction, by estimation, is 60 to 65%. The left ventricle has normal function. The left ventricle has no regional wall motion abnormalities. The left ventricular internal cavity size was normal in size. There is  no left ventricular hypertrophy. Left ventricular diastolic parameters are consistent with Grade I diastolic dysfunction (impaired relaxation). Right Ventricle: The right ventricular size is normal. No increase in right ventricular wall thickness. Right ventricular systolic function is normal. There is normal pulmonary artery systolic pressure. The tricuspid regurgitant velocity is 2.73 m/s, and  with an assumed right atrial pressure of 5 mmHg, the estimated right ventricular systolic pressure is 50.2 mmHg. Left Atrium: Left atrial size was normal in size. Right Atrium: Right atrial size was normal in size. Pericardium: There is no  evidence of pericardial effusion. Mitral Valve: The mitral valve is normal in structure. Mild mitral valve regurgitation. No evidence of mitral valve stenosis. Tricuspid Valve: The tricuspid valve is normal in structure. Tricuspid valve regurgitation is mild . No evidence of tricuspid stenosis. Aortic Valve: The aortic valve is normal in structure. Aortic valve regurgitation is not visualized. No aortic stenosis is present. Aortic valve peak gradient measures 8.4 mmHg. Pulmonic Valve: The pulmonic valve was normal in structure. Pulmonic valve regurgitation is not visualized. No evidence of pulmonic stenosis. Aorta: The aortic root is normal in size and structure. Venous: The inferior vena cava is normal in size with greater than 50% respiratory variability, suggesting right atrial pressure of 3 mmHg. IAS/Shunts: No atrial level shunt detected by color flow Doppler.  LEFT VENTRICLE PLAX 2D LVIDd:         4.28 cm  Diastology LVIDs:         3.22 cm  LV e' medial:    5.11 cm/s LV PW:         1.11 cm  LV E/e' medial:  9.5 LV IVS:        0.90 cm  LV e' lateral:   5.59 cm/s LVOT diam:     2.30 cm  LV E/e' lateral: 8.7 LVOT Area:     4.15 cm  RIGHT VENTRICLE RV Mid diam:    3.36 cm RV S prime:     19.90 cm/s TAPSE (M-mode): 3.1 cm LEFT ATRIUM             Index       RIGHT ATRIUM  Index LA diam:        4.00 cm 1.90 cm/m  RA Area:     14.80 cm LA Vol (A2C):   44.7 ml 21.23 ml/m RA Volume:   33.60 ml  15.96 ml/m LA Vol (A4C):   54.0 ml 25.65 ml/m LA Biplane Vol: 50.9 ml 24.18 ml/m  AORTIC VALVE                PULMONIC VALVE AV Area (Vmax): 3.15 cm    PV Vmax:        0.96 m/s AV Vmax:        145.00 cm/s PV Peak grad:   3.7 mmHg AV Peak Grad:   8.4 mmHg    RVOT Peak grad: 2 mmHg LVOT Vmax:      110.00 cm/s  AORTA Ao Root diam: 2.40 cm MITRAL VALVE               TRICUSPID VALVE MV Area (PHT): 3.42 cm    TR Peak grad:   29.8 mmHg MV Decel Time: 222 msec    TR Vmax:        273.00 cm/s MV E velocity: 48.80 cm/s MV  A velocity: 73.70 cm/s  SHUNTS MV E/A ratio:  0.66        Systemic Diam: 2.30 cm MV A Prime:    17.3 cm/s Ida Rogue MD Electronically signed by Ida Rogue MD Signature Date/Time: 11/03/2020/8:20:07 PM    Final         Scheduled Meds: . docusate sodium  100 mg Oral BID  . enoxaparin (LOVENOX) injection  40 mg Subcutaneous Q24H  . fluticasone  1 spray Each Nare BID  . gabapentin  300 mg Oral BID  . levothyroxine  50 mcg Oral Q0600  . loratadine  10 mg Oral Daily  . meloxicam  15 mg Oral Daily  . montelukast  10 mg Oral QHS  . ondansetron (ZOFRAN) IV  4 mg Intravenous Q6H  . pantoprazole (PROTONIX) IV  40 mg Intravenous Q24H   Continuous Infusions:   LOS: 5 days    Time spent: 35 minutes    Sharen Hones, MD [facility]   To contact the attending provider between 7A-7P or the covering provider during after hours 7P-7A, please log into the web site [email or web address] and access using universal [place] password for that web site. If you do not have the password, please call the hospital operator.  11/05/2020, 10:09 AM

## 2020-11-05 NOTE — TOC Progression Note (Signed)
Transition of Care [facility]) - Progression Note    Patient Details  Name: Kevin Terrell MRN: [id number] Date of Birth: 1941/05/22  Transition of Care [facility]) CM/SW [place], LCSW Phone Number: 11/05/2020, 11:50 AM  Clinical Narrative:  Per chart review, patient prefers to go to rehab side at [facility] at discharge. Patient off unit so CSW confirmed with daughter in room. [facility] admissions coordinator confirmed they will be able to take him for rehab.   Expected Discharge Plan: [place] Barriers to Discharge: Continued Medical Work up  Expected Discharge Plan and Services Expected Discharge Plan: [place]   Discharge Planning Services: CM Consult   Living arrangements for the past 2 months: [place]                 DME Arranged: N/A DME Agency: NA                   Social Determinants of Health (SDOH) Interventions    Readmission Risk Interventions No flowsheet data found.

## 2020-11-05 NOTE — Progress Notes (Addendum)
Kevin Darby, MD [address]  [place]  [place], [place] [address]  Main: [phone]  Fax: [phone] Pager: [phone]   Subjective: No acute events overnight.  Patient underwent laparoscopic cholecystectomy in setting of probable acute gallstone pancreatitis.  Patient did well postoperatively.  Intra-Op, there was evidence of purulent ascites, fluid was aspirated and sent for analysis.  When I interviewed the patient today, he has been doing well and tolerating clear liquids well.  He reports that he is at his best health since hospital admission.  He denies any fever, chills, nausea or vomiting, abdominal pain.  His daughter is bedside.   Objective: Vital signs in last 24 hours: Vitals:   11/05/20 0000 11/05/20 0424 11/05/20 0812 11/05/20 1159  BP: (!) 144/79 133/74 128/66 (!) 116/59  Pulse: 74 73 66 71  Resp: 16 16 15 17   Temp:  98 F (36.7 C) 98.5 F (36.9 C) 97.6 F (36.4 C)  TempSrc:      SpO2: 98% 99% 97% 92%  Weight:      Height:       Weight change:   Intake/Output Summary (Last 24 hours) at 11/05/2020 1546 Last data filed at 11/05/2020 1012 Gross per 24 hour  Intake 1010 ml  Output 475 ml  Net 535 ml     Exam: Heart:: Regular rate and rhythm, S1S2 present or without murmur or extra heart sounds Lungs: normal and clear to auscultation Abdomen: soft, nontender, normal bowel sounds   Lab Results: CBC Latest Ref Rng & Units 11/05/2020 11/04/2020 11/03/2020  WBC 4.0 - 10.5 K/uL 10.8(H) 12.4(H) 20.0(H)  Hemoglobin 13.0 - 17.0 g/dL 11.0(L) 10.7(L) 14.2  Hematocrit 39.0 - 52.0 % 31.3(L) 31.1(L) 42.0  Platelets 150 - 400 K/uL 138(L) 128(L) 145(L)   CMP Latest Ref Rng & Units 11/05/2020 11/04/2020 11/03/2020  Glucose 70 - 99 mg/dL 127(H) 105(H) 91  BUN 8 - 23 mg/dL 18 16 29(H)  Creatinine 0.61 - 1.24 mg/dL 0.68 0.65 0.90  Sodium 135 - 145 mmol/L 135 134(L) 129(L)  Potassium 3.5 - 5.1 mmol/L 3.8 3.6 4.1  Chloride 98 - 111 mmol/L 101 103 96(L)  CO2 22 -  32 mmol/L 26 27 21(L)  Calcium 8.9 - 10.3 mg/dL 8.0(L) 8.0(L) 7.5(L)  Total Protein 6.5 - 8.1 g/dL - - 5.6(L)  Total Bilirubin 0.3 - 1.2 mg/dL - - 0.8  Alkaline Phos 38 - 126 U/L - - 64  AST 15 - 41 U/L - - 21  ALT 0 - 44 U/L - - 11    Micro Results: Recent Results (from the past 240 hour(s))  SARS CORONAVIRUS 2 (TAT 6-24 HRS) Nasopharyngeal Nasopharyngeal Swab     Status: None   Collection Time: 10/31/20 10:00 PM   Specimen: Nasopharyngeal Swab  Result Value Ref Range Status   SARS Coronavirus 2 NEGATIVE NEGATIVE Final    Comment: (NOTE) SARS-CoV-2 target nucleic acids are NOT DETECTED.  The SARS-CoV-2 RNA is generally detectable in upper and lower respiratory specimens during the acute phase of infection. Negative results do not preclude SARS-CoV-2 infection, do not rule out co-infections with other pathogens, and should not be used as the sole basis for treatment or other patient management decisions. Negative results must be combined with clinical observations, patient history, and epidemiological information. The expected result is Negative.  Fact Sheet for Patients: SugarRoll.be  Fact Sheet for Healthcare Providers: https://www.woods-mathews.com/  This test is not yet approved or cleared by the [place] FDA and  has been authorized for detection and/or diagnosis of SARS-CoV-2 by FDA under an Emergency Use Authorization (EUA). This EUA will remain  in effect (meaning this test can be used) for the duration of the COVID-19 declaration under Se ction 564(b)(1) of the Act, 21 U.S.C. section 360bbb-3(b)(1), unless the authorization is terminated or revoked sooner.  Performed at [facility], [place] [address], [place], [place] [address]   CULTURE, BLOOD (ROUTINE X 2) w Reflex to ID Panel     Status: None (Preliminary result)   Collection Time: 11/01/20 10:56 AM   Specimen: BLOOD  Result Value Ref Range Status   Specimen  Description BLOOD LEFT ANTECUBITAL  Final   Special Requests   Final    BOTTLES DRAWN AEROBIC AND ANAEROBIC Blood Culture results may not be optimal due to an excessive volume of blood received in culture bottles   Culture   Final    NO GROWTH 4 DAYS Performed at [facility], [address] [place], [place] [address]    Report Status PENDING  Incomplete  CULTURE, BLOOD (ROUTINE X 2) w Reflex to ID Panel     Status: None (Preliminary result)   Collection Time: 11/01/20 12:38 PM   Specimen: BLOOD  Result Value Ref Range Status   Specimen Description BLOOD RIGHT ANTECUBITAL  Final   Special Requests   Final    BOTTLES DRAWN AEROBIC AND ANAEROBIC Blood Culture adequate volume   Culture   Final    NO GROWTH 4 DAYS Performed at [facility], [address] [place], [place] [address]    Report Status PENDING  Incomplete  Aerobic/Anaerobic Culture w Gram Stain (surgical/deep wound)     Status: None (Preliminary result)   Collection Time: 11/04/20  8:01 PM   Specimen: PATH Other; Body Fluid  Result Value Ref Range Status   Specimen Description   Final    FLUID abdominal Performed at [facility], [address], [place], [place] [address]    Special Requests   Final    NONE Performed at [facility], [address] [place], [place] [address]    Gram Stain   Final    MODERATE WBC PRESENT,BOTH PMN AND MONONUCLEAR NO ORGANISMS SEEN Performed at [facility], [place] [address], [place], [place] [address]    Culture PENDING  Incomplete   Report Status PENDING  Incomplete   Studies/Results: US LIVER DOPPLER  Result Date: 11/05/2020 CLINICAL DATA:  Hemangioma? EXAM: DUPLEX ULTRASOUND OF LIVER TECHNIQUE: Color and duplex Doppler ultrasound was performed to evaluate the hepatic in-flow and out-flow vessels. COMPARISON:  CT abdomen pelvis with contrast 11/02/2020 MRI abdomen 11/01/2020 FINDINGS: Liver: Normal parenchymal echogenicity. Normal  hepatic contour without nodularity. 2.4 x 2.3 x 2.2 cm echogenic lesion seen in the dome of the right hepatic lobe consistent with hemangioma seen on recent MRI. This lesion is better assessed on the recent MRI. Main Portal Vein size: 0.8 cm Portal Vein Velocities Main Prox:  49 cm/sec Main Mid: 50 cm/sec Main Dist:  52 cm/sec Right: 27 cm/sec Left: 40 cm/sec Hepatic Vein Velocities Right:  32 cm/sec Middle:  27 cm/sec Left:  32 cm/sec IVC: Present and patent with normal respiratory phasicity. Hepatic Artery Velocity:  48 cm/sec Splenic Vein Velocity:  27 cm/sec Spleen: 6.6 cm x 3.4 cm x 9.8 cm with a total volume of 115 cm^3 (411 cm^3 is upper limit normal) Portal Vein Occlusion/Thrombus: No Splenic Vein Occlusion/Thrombus: No Ascites: None Varices: None  Small bilateral pleural effusions partially visualized. IMPRESSION: Echogenic lesion in the dome of the right hepatic lobe measuring 2.4 x 2.2 x 2.3 cm is consistent with a hemangioma given appearance on recent MRI. Electronically Signed   By: Miachel Roux M.D.   On: 11/05/2020 12:22   Medications:  I have reviewed the patient's current medications. Prior to Admission:  Medications Prior to Admission  Medication Sig Dispense Refill Last Dose  . cetirizine (ZYRTEC) 10 MG tablet Take 10 mg by mouth daily.   Unknown at Unknown  . cholecalciferol (VITAMIN D3) 25 MCG (1000 UT) tablet Take 5,000 Units by mouth daily.   Unknown at Unknown  . famotidine (PEPCID) 20 MG tablet Take 1 tablet (20 mg total) by mouth at bedtime. 90 tablet 3 Unknown at Unknown  . fluticasone (FLONASE) 50 MCG/ACT nasal spray Place 1 spray into both nostrils 2 (two) times daily.   Unknown at Unknown  . gabapentin (NEURONTIN) 300 MG capsule TAKE 1 CAPSULE BY MOUTH TWICE DAILY 180 capsule 0 Unknown at Unknown  . levothyroxine (SYNTHROID) 50 MCG tablet TAKE 1 TABLET BY MOUTH ONCE A DAY BEFOREBREAKFAST. 90 tablet 0 Unknown at Unknown  . meloxicam (MOBIC) 15 MG tablet TAKE ONE TABLET BY  MOUTH DAILY 90 tablet 1 Unknown at Unknown  . montelukast (SINGULAIR) 10 MG tablet TAKE 1 TABLET BY MOUTH EVERY NIGHT AT BEDTIME 90 tablet 3 Unknown at Unknown  . Multiple Vitamin (MULTIVITAMIN WITH MINERALS) TABS tablet Take 1 tablet by mouth daily.   Unknown at Unknown  . Probiotic Product (PROBIOTIC PO) Take 1 capsule by mouth 2 (two) times daily.   Unknown at Unknown  . rosuvastatin (CRESTOR) 10 MG tablet Take 1 tablet (10 mg total) by mouth daily. 90 tablet 3 Unknown at Unknown  . ketoconazole (NIZORAL) 2 % cream Apply  a small amount to affected area twice a day (Patient not taking: No sig reported)   Not Taking at Unknown time  . nystatin-triamcinolone ointment (MYCOLOG) Apply 1 application topically 2 (two) times daily. (Patient not taking: No sig reported) 30 g 3 Not Taking at Unknown time  . Olopatadine HCl 0.2 % SOLN as needed.  (Patient not taking: No sig reported)   Not Taking at Unknown time  . tacrolimus (PROTOPIC) 0.1 % ointment Apply topically 2 (two) times daily. 100 g 0   . tacrolimus (PROTOPIC) 0.1 % ointment Apply topically in the morning and at bedtime. (Patient not taking: No sig reported) 30 g 0 Completed Course at Unknown time  . triamcinolone ointment (KENALOG) 0.1 % APPLY TO HANDS AT BEDTIME AND COVER WITHGLOVES AS NEEDED FOR FLARES. AVOID FACE/GROIN/AXILLA. (Patient not taking: No sig reported) 80 g 1 Not Taking at Unknown time   Scheduled: . docusate sodium  100 mg Oral BID  . enoxaparin (LOVENOX) injection  40 mg Subcutaneous Q24H  . fluticasone  1 spray Each Nare BID  . gabapentin  300 mg Oral BID  . levothyroxine  50 mcg Oral Q0600  . loratadine  10 mg Oral Daily  . meloxicam  15 mg Oral Daily  . montelukast  10 mg Oral QHS  . ondansetron (ZOFRAN) IV  4 mg Intravenous Q6H  . pantoprazole (PROTONIX) IV  40 mg Intravenous Q24H   Continuous:  UYQ:[id number], HYDROcodone-acetaminophen, LORazepam, metoCLOPramide (REGLAN) injection, ondansetron (ZOFRAN)  IV Anti-infectives (From admission, onward)   None     Scheduled Meds: . docusate sodium  100 mg Oral BID  . enoxaparin (LOVENOX) injection  40 mg  Subcutaneous Q24H  . fluticasone  1 spray Each Nare BID  . gabapentin  300 mg Oral BID  . levothyroxine  50 mcg Oral Q0600  . loratadine  10 mg Oral Daily  . meloxicam  15 mg Oral Daily  . montelukast  10 mg Oral QHS  . ondansetron (ZOFRAN) IV  4 mg Intravenous Q6H  . pantoprazole (PROTONIX) IV  40 mg Intravenous Q24H   Continuous Infusions: PRN Meds:.acetaminophen, HYDROcodone-acetaminophen, LORazepam, metoCLOPramide (REGLAN) injection, ondansetron (ZOFRAN) IV   Assessment: Principal Problem:   Syncope Active Problems:   Hypothyroidism   Gastroesophageal reflux disease without esophagitis   Abdominal pain   Dilation of pancreatic duct   Dehydration   Acute pancreatitis   Peritonitis ([place])  Plan: Acute gallstone pancreatitis Pancreatitis is currently resolved, s/p laparoscopic cholecystectomy on 3/6 Patient is tolerating clear liquids well Advance diet as tolerated No indication for antibiotics at this time  ?  Purulent ascites fluid Normal albumin levels, no evidence of cirrhosis or portal hypertension Ultrasound liver Dopplers confirms hemangioma of the liver only, no evidence of portal vein thrombosis, cirrhosis or portal hypertension Gram stain revealed WBCs only, no evidence of organisms, cultures in process Leukocytosis is improving No indication for antibiotics Cytology is pending ID is consulted Patient can be discharged home and ascitic fluid analysis can be followed as outpatient by general surgery   LOS: 5 days   Kevin Terrell 11/05/2020, 3:46 PM

## 2020-11-06 DIAGNOSIS — R55 Syncope and collapse: Secondary | ICD-10-CM

## 2020-11-06 DIAGNOSIS — D649 Anemia, unspecified: Secondary | ICD-10-CM | POA: Diagnosis not present

## 2020-11-06 DIAGNOSIS — E876 Hypokalemia: Secondary | ICD-10-CM

## 2020-11-06 DIAGNOSIS — K659 Peritonitis, unspecified: Secondary | ICD-10-CM | POA: Diagnosis not present

## 2020-11-06 DIAGNOSIS — K859 Acute pancreatitis without necrosis or infection, unspecified: Secondary | ICD-10-CM | POA: Diagnosis not present

## 2020-11-06 DIAGNOSIS — K8010 Calculus of gallbladder with chronic cholecystitis without obstruction: Secondary | ICD-10-CM | POA: Diagnosis not present

## 2020-11-06 LAB — CBC WITH DIFFERENTIAL/PLATELET
Abs Immature Granulocytes: 0.1 10*3/uL — ABNORMAL HIGH (ref 0.00–0.07)
Basophils Absolute: 0 10*3/uL (ref 0.0–0.1)
Basophils Relative: 0 %
Eosinophils Absolute: 0.3 10*3/uL (ref 0.0–0.5)
Eosinophils Relative: 2 %
HCT: 33.6 % — ABNORMAL LOW (ref 39.0–52.0)
Hemoglobin: 11.7 g/dL — ABNORMAL LOW (ref 13.0–17.0)
Immature Granulocytes: 1 %
Lymphocytes Relative: 9 %
Lymphs Abs: 1.3 10*3/uL (ref 0.7–4.0)
MCH: 31.3 pg (ref 26.0–34.0)
MCHC: 34.8 g/dL (ref 30.0–36.0)
MCV: 89.8 fL (ref 80.0–100.0)
Monocytes Absolute: 1.6 10*3/uL — ABNORMAL HIGH (ref 0.1–1.0)
Monocytes Relative: 11 %
Neutro Abs: 11.1 10*3/uL — ABNORMAL HIGH (ref 1.7–7.7)
Neutrophils Relative %: 77 %
Platelets: 189 10*3/uL (ref 150–400)
RBC: 3.74 MIL/uL — ABNORMAL LOW (ref 4.22–5.81)
RDW: 12.4 % (ref 11.5–15.5)
WBC: 14.3 10*3/uL — ABNORMAL HIGH (ref 4.0–10.5)
nRBC: 0 % (ref 0.0–0.2)

## 2020-11-06 LAB — HEPATIC FUNCTION PANEL
ALT: 26 U/L (ref 0–44)
AST: 34 U/L (ref 15–41)
Albumin: 2.9 g/dL — ABNORMAL LOW (ref 3.5–5.0)
Alkaline Phosphatase: 56 U/L (ref 38–126)
Bilirubin, Direct: <0.1 mg/dL (ref 0.0–0.2)
Total Bilirubin: 0.6 mg/dL (ref 0.3–1.2)
Total Protein: 5.9 g/dL — ABNORMAL LOW (ref 6.5–8.1)

## 2020-11-06 LAB — BASIC METABOLIC PANEL
Anion gap: 8 (ref 5–15)
BUN: 17 mg/dL (ref 8–23)
CO2: 27 mmol/L (ref 22–32)
Calcium: 8 mg/dL — ABNORMAL LOW (ref 8.9–10.3)
Chloride: 103 mmol/L (ref 98–111)
Creatinine, Ser: 0.72 mg/dL (ref 0.61–1.24)
GFR, Estimated: >60 mL/min (ref >60)
Glucose, Bld: 87 mg/dL (ref 70–99)
Potassium: 3.3 mmol/L — ABNORMAL LOW (ref 3.5–5.1)
Sodium: 138 mmol/L (ref 135–145)

## 2020-11-06 LAB — CULTURE, BLOOD (ROUTINE X 2)
Culture: NO GROWTH
Culture: NO GROWTH
Special Requests: ADEQUATE

## 2020-11-06 LAB — SURGICAL PATHOLOGY

## 2020-11-06 LAB — PATHOLOGIST SMEAR REVIEW

## 2020-11-06 LAB — MAGNESIUM: Magnesium: 2.3 mg/dL (ref 1.7–2.4)

## 2020-11-06 LAB — CYTOLOGY - NON PAP

## 2020-11-06 LAB — IGG 4: IgG, Subclass 4: 14 mg/dL (ref 2–96)

## 2020-11-06 MED ORDER — SIMETHICONE 80 MG PO CHEW
80.0000 mg | CHEWABLE_TABLET | Freq: Four times a day (QID) | ORAL | Status: DC | PRN
  Administered 2020-11-06 – 2020-11-08 (×5): 80 mg via ORAL
  Filled 2020-11-06 (×6): qty 1

## 2020-11-06 MED ORDER — SIMETHICONE 80 MG PO CHEW
80.0000 mg | CHEWABLE_TABLET | Freq: Four times a day (QID) | ORAL | Status: DC
  Administered 2020-11-06: 80 mg via ORAL
  Filled 2020-11-06 (×4): qty 1

## 2020-11-06 MED ORDER — PANTOPRAZOLE SODIUM 40 MG PO TBEC: 40.0000 mg | DELAYED_RELEASE_TABLET | Freq: Every day | ORAL | 0 refills | Status: DC

## 2020-11-06 MED ORDER — SENNOSIDES-DOCUSATE SODIUM 8.6-50 MG PO TABS: 2.0000 | ORAL_TABLET | Freq: Two times a day (BID) | ORAL | Status: DC

## 2020-11-06 MED ORDER — SODIUM CHLORIDE 0.9 % IV SOLN
2.0000 g | INTRAVENOUS | Status: DC
  Administered 2020-11-06 – 2020-11-08 (×3): 2 g via INTRAVENOUS
  Filled 2020-11-06: qty 20
  Filled 2020-11-06 (×3): qty 2
  Filled 2020-11-06: qty 20

## 2020-11-06 MED ORDER — METRONIDAZOLE IN NACL 5-0.79 MG/ML-% IV SOLN
500.0000 mg | Freq: Three times a day (TID) | INTRAVENOUS | Status: DC
  Administered 2020-11-06 – 2020-11-08 (×7): 500 mg via INTRAVENOUS
  Filled 2020-11-06 (×10): qty 100

## 2020-11-06 MED ORDER — AMOXICILLIN-POT CLAVULANATE 875-125 MG PO TABS: 1.0000 | ORAL_TABLET | Freq: Two times a day (BID) | ORAL | 0 refills | Status: DC

## 2020-11-06 MED ORDER — SENNOSIDES-DOCUSATE SODIUM 8.6-50 MG PO TABS
2.0000 | ORAL_TABLET | Freq: Two times a day (BID) | ORAL | Status: DC
  Administered 2020-11-06 – 2020-11-08 (×5): 2 via ORAL
  Filled 2020-11-06 (×5): qty 2

## 2020-11-06 MED ORDER — POTASSIUM CHLORIDE 20 MEQ PO PACK
40.0000 meq | PACK | ORAL | Status: AC
  Administered 2020-11-06 (×2): 40 meq via ORAL
  Filled 2020-11-06 (×2): qty 2

## 2020-11-06 NOTE — Progress Notes (Signed)
Brief GI note  Ascitic fluid analysis suggests secondary bacterial peritonitis No evidence of cirrhosis or portal hypertension Patient is currently on antibiotics Follow-up with ID for further recommendations for management of secondary bacterial peritonitis GI will sign off at this time Please call us back with questions or concerns  [name], MD [address]  [place], [place] [address]  Main: [phone]  Fax: [phone] Pager: [phone]

## 2020-11-06 NOTE — Discharge Summary (Addendum)
Physician Discharge Summary  Patient ID: Kevin Terrell MRN: [id number] DOB/AGE: 79/79/1942 79 y.o.  Admit date: 10/31/2020 Discharge date: 11/06/2020  Admission Diagnoses:  Discharge Diagnoses:  Principal Problem:   Syncope Active Problems:   Hypothyroidism   Gastroesophageal reflux disease without esophagitis   Abdominal pain   Dilation of pancreatic duct   Dehydration   Acute pancreatitis   Peritonitis [facility])   Discharged Condition: good  Hospital Course:  The patient is a 80 yr old man who presented to [facility] ED on 10/31/2020 with complaints of syncopal episode.  He also had a 1 day of acute abdominal pain.  MRCP showed acute pancreatitis with dilated pancreatic duct and CBD.  Patient has been seen by GI, suspect patient had passed a gallstone. To prevent recurrence of pancreatitis, cholecystectomy was performed 3/6. CT and MRI also showed significant lymphadenopathy within the abdomen.  Patient has been seen by oncology, will follow up as outpatient. However, during the cholecystectomy, patient will drain out about 500 mL of purulent ascites.  Culture so far negative for any pathogen. Patient condition had improved, currently he has no complaints.  He has been seen by physical therapy/Occupational Therapy, recommend SNF placement.  Patient is medically stable to be discharged. Follow-ups to be arranged for primary care physician, general surgery, GI, and oncology.  #1.  Peritonitis. Non infectious.  Intra-abdominal lymphadenopathy. Discussed with Dr. [name], purulent ascites was seen during surgery, it looked white-yellow in color.    Cultures so far negative, I personally talked to the microbiology lab. Patient has been seen by GI as well as infectious disease, does not suspect any bacterial infection.  Peritonitis could be reactive to acute pancreatitis. Patient definitely does not have any gallbladder perforation.  He does not have any evidence of bowel perforation. Liver Doppler  ultrasound did not show any portal vein thrombosis. Patient also had intra-abdominal lymphadenopathy on CT and MRI scan, that would be followed by Dr. Grayland Ormond from oncology.  Cytology from ascites also pending. Addendum: D/w Dr. Oris Drone, would like to keep patient in the hospital and start antibiotics.  Discharge canceled.  #2. Acute gallstone pancreatitis. Cholelithiasis. Condition had improved, currently doing well after cholecystectomy.  #3. syncope with dehydration. Hyponatremia. Condition improved.  4.  Hypothyroidism. Continue Synthroid.   Consults: ID, GI and general surgery  Significant Diagnostic Studies:  DUPLEX ULTRASOUND OF LIVER  TECHNIQUE: Color and duplex Doppler ultrasound was performed to evaluate the hepatic in-flow and out-flow vessels.  COMPARISON:  CT abdomen pelvis with contrast 11/02/2020  MRI abdomen 11/01/2020  FINDINGS: Liver: Normal parenchymal echogenicity. Normal hepatic contour without nodularity.  2.4 x 2.3 x 2.2 cm echogenic lesion seen in the dome of the right hepatic lobe consistent with hemangioma seen on recent MRI. This lesion is better assessed on the recent MRI.  Main Portal Vein size: 0.8 cm  Portal Vein Velocities  Main Prox:  49 cm/sec  Main Mid: 50 cm/sec  Main Dist:  52 cm/sec Right: 27 cm/sec Left: 40 cm/sec  Hepatic Vein Velocities  Right:  32 cm/sec  Middle:  27 cm/sec  Left:  32 cm/sec  IVC: Present and patent with normal respiratory phasicity.  Hepatic Artery Velocity:  48 cm/sec  Splenic Vein Velocity:  27 cm/sec  Spleen: 6.6 cm x 3.4 cm x 9.8 cm with a total volume of 115 cm^3 (411 cm^3 is upper limit normal)  Portal Vein Occlusion/Thrombus: No  Splenic Vein Occlusion/Thrombus: No  Ascites: None  Varices: None  Small bilateral pleural effusions  partially visualized.  IMPRESSION: Echogenic lesion in the dome of the right hepatic lobe measuring 2.4 x 2.2 x 2.3  cm is consistent with a hemangioma given appearance on recent MRI.   Electronically Signed   By: Miachel Roux M.D.   On: 11/05/2020 12:22  CT ABDOMEN AND PELVIS WITH CONTRAST  TECHNIQUE: Multidetector CT imaging of the abdomen and pelvis was performed using the standard protocol following bolus administration of intravenous contrast.  CONTRAST:  <See Chart> OMNIPAQUE IOHEXOL 240 MG/ML SOLN, 125mL OMNIPAQUE IOHEXOL 300 MG/ML SOLN  COMPARISON:  MRI abdomen 11/01/2020, CT abdomen pelvis 10/31/2020  FINDINGS: Lower chest: Interval development of bilateral trace volume pleural effusions with associated passive atelectasis bilateral lower lobes. Coronary artery calcifications.  Hepatobiliary: Redemonstration of a stable 2.1 cm hypodensity within the right hepatic lobe (2:11) that is consistent with a known hepatic hemangioma better evaluated on MRI abdomen 11/01/2020. Subcentimeter hypodensities are again noted and too small to characterize. No focal liver abnormality. Layering high density within the gallbladder lumen likely represent vicarious excretion of previously administered intravenous contrast. No gallstones, gallbladder wall thickening, or pericholecystic fluid. No biliary dilatation.  Pancreas: Hazy pancreatic contour with interval increase in peripancreatic fat stranding and free fluid. No pseudocyst formation. Focal borderline dilatation of the main pancreatic duct in the proximal pancreas is poorly visualized but still present. Finding better evaluated on MRI abdomen 11/01/2020. No diffuse main pancreatic duct dilatation.  Spleen: Normal in size without focal abnormality.  Adrenals/Urinary Tract: No adrenal nodule bilaterally. Bilateral kidneys enhance symmetrically. No hydronephrosis. No hydroureter. The urinary bladder is unremarkable. On delayed imaging, there is no urothelial wall thickening and there are no filling defects in the opacified  portions of the bilateral collecting systems or ureters.  Stomach/Bowel: Stomach is within normal limits. No evidence of bowel wall thickening or dilatation. Appendix appears normal.  Vascular/Lymphatic: The portal, splenic, superior mesenteric veins are patent. Limited evaluation of the splenic artery on this portal venous single phase contrast study. No abdominal aorta or iliac aneurysm. Severe atherosclerotic plaque of the aorta and its branches. Redemonstration of ill-defined hazy density throughout the retroperitoneum along the aorta andinferior vena cava with extension anteriorly to the mesenteric vessels suggestive of possibly lymphoproliferative disorder.  Reproductive: The prostate is enlarged measuring up to 5.1 cm.  Other: Interval increase in moderate volume simple fluid ascites. No intraperitoneal free gas. No organized fluid collection.  Musculoskeletal:  No abdominal wall hernia or abnormality.  No suspicious lytic or blastic osseous lesions. No acute displaced fracture. Multilevel degenerative changes of the spine.  IMPRESSION: 1. Acute pancreatitis with interval increase in moderate volume simple free fluid ascites. No pseudocyst formation. 2. 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 Recommend PET-CT for further evaluation. 3. Sigmoid diverticulosis with no acute diverticulitis. 4.  Aortic Atherosclerosis (ICD10-I70.0).   Electronically Signed   By: Iven Finn M.D.   On: 11/02/2020 16:49  MRI ABDOMEN WITHOUT AND WITH CONTRAST (INCLUDING MRCP)  TECHNIQUE: Multiplanar multisequence MR imaging of the abdomen was performed both before and after the administration of intravenous contrast. Heavily T2-weighted images of the biliary and pancreatic  ducts were obtained, and three-dimensional MRCP images were rendered by post processing.  CONTRAST:  61mL GADAVIST GADOBUTROL 1 MMOL/ML IV SOLN  COMPARISON:  CT scan 10/31/2020.  FINDINGS: Lower chest: Unremarkable.  Hepatobiliary: 2.3 cm lesion noted posterior right liver showing  some peripheral nodular enhancement after IV contrast administration. No substantial filling in on more delayed imaging. This lesion was present on a chest CT from 11/28/2019 and is stable in size in the interval. 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 1.4 cm simple cyst noted in the dome of the left liver several other scattered tiny T2 hyperintensities are too small to characterize. There is no evidence for gallstones, gallbladder wall thickening, or pericholecystic fluid.  Mild intrahepatic biliary duct dilatation noted with focal narrowing of the common duct in the hepatoduodenal ligament (see coronal thin MRCP image 12 of series 14 and postcontrast coronal image 43 of series 23. This finding is also demonstrated on 3D MRCP image 3 of series 10/21 and image 1 of series 1032. No discernible obstructing stone or mass lesion evident.  Heterogeneous perfusion of liver parenchyma evident on arterial phase imaging.  Pancreas: Diffuse edema in and around the pancreas appears progressive since CT scan 1 day earlier. Pancreatic duct is less dilated measuring 3 mm diameter in the body and 4 mm diameter in the head of pancreas. No discrete pancreatic mass lesion evident.  Spleen:  No splenomegaly. No focal mass lesion.  Adrenals/Urinary Tract: No adrenal nodule or mass. No gross suspicious abnormality in either kidney.  Stomach/Bowel: Stomach is markedly distended with food and fluid. Duodenum is nondistended with duodenal diverticulum evident. No small bowel or colonic dilatation within the visualized abdomen.  Vascular/Lymphatic: No abdominal aortic aneurysm no substantial  wall thickening or mural enhancement in the aorta. Variant anatomy with common trunk for the celiac axis and SMA noted.  Other: As seen on previous CT, there is diffuse retroperitoneal edema, progressive in the interval with fluid now visible in the para colic gutters bilaterally  Musculoskeletal: No focal suspicious marrow enhancement within the visualized bony anatomy.  IMPRESSION: 1. Mild intrahepatic biliary duct dilatation with focal narrowing of the common duct in the hepatoduodenal ligament. No discernible obstructing stone or mass lesion evident. ERCP may be warranted to further evaluate. 2. No gallstones or evidence of choledocholithiasis. 3. Diffuse edema in and around the pancreas appears progressive since CT scan 1 day earlier. Pancreatic duct is less dilated. No discrete mass lesion evident. Imaging appearance suggests pancreatitis. 4. Interval progression of retroperitoneal fluid with fluid now visible in the para colic gutters. As on CT yesterday, there is retroperitoneal edema around the aorta but no substantial aortic wall thickening to suggest aortitis/vasculitis. 5. 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 Smaller cyst noted left hepatic lobe with additional tiny T2 hyperintense liver lesions too small to characterize. 6. Marked distention of the stomach with food and fluid. No duodenal dilatation. No obstructing mass lesion, but component of gastric outlet obstruction not excluded.   Electronically Signed   By: Misty Stanley M.D.   On: 11/01/2020 07:54  Treatments: IV fluids, cholecystectomy.  Discharge Exam: Blood pressure 132/73, pulse 80, temperature 98.7 F (37.1 C), resp. rate 18, height 6\' 1"  (1.854 m), weight 88.5 kg, SpO2 98 %. General appearance: alert and cooperative Resp: clear to auscultation bilaterally Cardio: regular rate and rhythm, S1, S2 normal, no murmur, click, rub or gallop GI: soft,  non-tender; bowel sounds normal; no masses,  no organomegaly Extremities: extremities normal, atraumatic, no cyanosis or edema  Disposition: Discharge disposition: 03-Skilled Nursing Facility       Discharge Instructions    Diet - low sodium heart healthy   Complete by: As directed    Discharge wound care:   Complete  by: As directed    Follow by RN and keep clean   Increase activity slowly   Complete by: As directed      Allergies as of 11/06/2020      Reactions   Compazine [prochlorperazine Edisylate]    muscle spasmus   Other    Okra - hives      Medication List    TAKE these medications   cetirizine 10 MG tablet Commonly known as: ZYRTEC Take 10 mg by mouth daily.   cholecalciferol 25 MCG (1000 UNIT) tablet Commonly known as: VITAMIN D3 Take 5,000 Units by mouth daily.   famotidine 20 MG tablet Commonly known as: PEPCID Take 1 tablet (20 mg total) by mouth at bedtime.   fluticasone 50 MCG/ACT nasal spray Commonly known as: FLONASE Place 1 spray into both nostrils 2 (two) times daily.   gabapentin 300 MG capsule Commonly known as: NEURONTIN TAKE 1 CAPSULE BY MOUTH TWICE DAILY   ketoconazole 2 % cream Commonly known as: NIZORAL Apply  a small amount to affected area twice a day   levothyroxine 50 MCG tablet Commonly known as: SYNTHROID TAKE 1 TABLET BY MOUTH ONCE A DAY BEFOREBREAKFAST.   meloxicam 15 MG tablet Commonly known as: MOBIC TAKE ONE TABLET BY MOUTH DAILY   montelukast 10 MG tablet Commonly known as: SINGULAIR TAKE 1 TABLET BY MOUTH EVERY NIGHT AT BEDTIME   multivitamin with minerals Tabs tablet Take 1 tablet by mouth daily.   nystatin-triamcinolone ointment Commonly known as: MYCOLOG Apply 1 application topically 2 (two) times daily.   Olopatadine HCl 0.2 % Soln as needed.   pantoprazole 40 MG tablet Commonly known as: Protonix Take 1 tablet (40 mg total) by mouth daily.   PROBIOTIC PO Take 1 capsule by mouth 2 (two) times  daily.   rosuvastatin 10 MG tablet Commonly known as: Crestor Take 1 tablet (10 mg total) by mouth daily.   senna-docusate 8.6-50 MG tablet Commonly known as: Senokot-S Take 2 tablets by mouth 2 (two) times daily.   tacrolimus 0.1 % ointment Commonly known as: PROTOPIC Apply topically 2 (two) times daily.   tacrolimus 0.1 % ointment Commonly known as: PROTOPIC Apply topically in the morning and at bedtime.   triamcinolone ointment 0.1 % Commonly known as: KENALOG APPLY TO HANDS AT BEDTIME AND COVER WITHGLOVES AS NEEDED FOR FLARES. AVOID FACE/GROIN/AXILLA.            Discharge Care Instructions  (From admission, onward)         Start     Ordered   11/06/20 0000  Discharge wound care:       Comments: Follow by RN and keep clean   11/06/20 1017          Contact information for after-discharge care    Destination    [facility] .   Service: Skilled Nursing Contact information: [place] [phone]                 35 minutes Signed: Sharen Hones 11/06/2020, 10:17 AM

## 2020-11-06 NOTE — Progress Notes (Signed)
Subjective:  CC: Kevin Terrell is a 80 y.o. male  Hospital stay day 6, 2 Days Post-Op robotic lap chole  HPI: No acute issues overnight. Tolerating regular diet, having BMs  ROS:  General: Denies weight loss, weight gain, fatigue, fevers, chills, and night sweats. Heart: Denies chest pain, palpitations, racing heart, irregular heartbeat, leg pain or swelling, and decreased activity tolerance. Respiratory: Denies breathing difficulty, shortness of breath, wheezing, cough, and sputum. GI: Denies change in appetite, heartburn, nausea, vomiting, constipation, diarrhea, and blood in stool. GU: Denies difficulty urinating, pain with urinating, urgency, frequency, blood in urine.   Objective:   Temp:  [97.9 F (36.6 C)-98.7 F (37.1 C)] 98.1 F (36.7 C) (03/08 1210) Pulse Rate:  [64-91] 91 (03/08 1210) Resp:  [15-18] 16 (03/08 1210) BP: (104-150)/(65-82) 104/65 (03/08 1210) SpO2:  [98 %-100 %] 100 % (03/08 1210) Weight:  [88.5 kg] 88.5 kg (03/08 0600)     Height: 6\' 1"  (185.4 cm) Weight: 88.5 kg BMI (Calculated): 25.75   Intake/Output this shift:   Intake/Output Summary (Last 24 hours) at 11/06/2020 1347 Last data filed at 11/06/2020 0600 Gross per 24 hour  Intake 120 ml  Output 500 ml  Net -380 ml    Constitutional :  alert, cooperative, appears stated age and no distress  Respiratory:  clear to auscultation bilaterally  Cardiovascular:  regular rate and rhythm  Gastrointestinal: soft, non-tender; bowel sounds normal; no masses,  no organomegaly.   Skin: Cool and moist. Incisions c/d/i.  Psychiatric: Normal affect, non-agitated, not confused       LABS:  CMP Latest Ref Rng & Units 11/06/2020 11/05/2020 11/04/2020  Glucose 70 - 99 mg/dL 87 127(H) 105(H)  BUN 8 - 23 mg/dL 17 18 16   Creatinine 0.61 - 1.24 mg/dL 0.72 0.68 0.65  Sodium 135 - 145 mmol/L 138 135 134(L)  Potassium 3.5 - 5.1 mmol/L 3.3(L) 3.8 3.6  Chloride 98 - 111 mmol/L 103 101 103  CO2 22 - 32 mmol/L 27 26 27    Calcium 8.9 - 10.3 mg/dL 8.0(L) 8.0(L) 8.0(L)  Total Protein 6.5 - 8.1 g/dL 5.9(L) - -  Total Bilirubin 0.3 - 1.2 mg/dL 0.6 - -  Alkaline Phos 38 - 126 U/L 56 - -  AST 15 - 41 U/L 34 - -  ALT 0 - 44 U/L 26 - -   CBC Latest Ref Rng & Units 11/06/2020 11/05/2020 11/04/2020  WBC 4.0 - 10.5 K/uL 14.3(H) 10.8(H) 12.4(H)  Hemoglobin 13.0 - 17.0 g/dL 11.7(L) 11.0(L) 10.7(L)  Hematocrit 39.0 - 52.0 % 33.6(L) 31.3(L) 31.1(L)  Platelets 150 - 400 K/uL 189 138(L) 128(L)    RADS: n/a Assessment:   S/p robo lap chole for presumed gallstone pancreatitis. Purulent ascites  Fluid analysis concerning for bacterial peritonitis, and wbc increased today. ID recommending starting IV abx and monitoring wbc.  Agree with above.  Still no surgical issues since patient clinically remains overall asymptomatic, tolerating diet, has BMs.    Surgery will continue to be available as needed.

## 2020-11-06 NOTE — Progress Notes (Signed)
Physical Therapy Treatment Patient Details Name: Kevin Terrell MRN: [id number] DOB: 02-04-1941 Today's Date: 11/06/2020    History of Present Illness Kevin Terrell is a 80 y.o. male with medical history significant for lung cancer status post right upper lobectomy in April 2009 and status post chemotherapy reportedly now in remission, GERD, acquired hypothyroidism, osteoarthritis, who is admitted to [facility] on 10/31/2020 for further evaluation of syncope after presenting from home to [facility] ED complaining of episode of loss of consciousness.   MRCP was obtained and demonstrated acute pancreatitis, dilated pancreatic duct, and CBD. There is also a question of obstruction of gallbladder. LFT's were elevated on admissionGI was consulted. GI has recommended consult with General Surgery re possible cholecystectomy to prevent further episodes of gallstone pancreatitis.  Pt and family eager to prevent further recurrence, so will proceed with surgery once echo clearance obtained and Na level normalizes.    PT Comments    Patient is very pleasant and agreeable to PT session. He is less anxious about mobility this session. He performed sit to stand transfer with supervision. Ambulated with RW 400 feet with supervision. Reports mild dizziness with activity. Patient will continue to benefit from skilled PT while here to improve safety and activity tolerance.     Follow Up Recommendations  SNF     Equipment Recommendations  Rolling walker with 5" wheels;3in1 (PT)    Recommendations for Other Services       Precautions / Restrictions Precautions Precautions: Fall Restrictions Weight Bearing Restrictions: No    Mobility  Bed Mobility               General bed mobility comments: In chair upon arrival to room    Transfers Overall transfer level: Modified independent Equipment used: Rolling walker (2 wheeled) Transfers: Sit to/from Stand Sit to Stand: Modified  independent (Device/Increase time)         General transfer comment: Patient completed sit <> stand from bed to RW with supervision. Demonstrated good use of body position and UEs. Reports very mild dizziness.  Ambulation/Gait Ambulation/Gait assistance: Supervision Gait Distance (Feet): 400 Feet Assistive device: Rolling walker (2 wheeled) Gait Pattern/deviations: Step-through pattern;WFL(Within Functional Limits) Gait velocity: decreased   General Gait Details: no lob, reports mild dizziness with ambulation   Stairs             Wheelchair Mobility    Modified Rankin (Stroke Patients Only)       Balance Overall balance assessment: Modified Independent Sitting-balance support: Feet supported Sitting balance-Leahy Scale: Good Sitting balance - Comments: steady sitting edge of bed   Standing balance support: Bilateral upper extremity supported;During functional activity Standing balance-Leahy Scale: Good                              Cognition Arousal/Alertness: Awake/alert Behavior During Therapy: WFL for tasks assessed/performed Overall Cognitive Status: Within Functional Limits for tasks assessed                                 General Comments: Pleasant and agreeable      Exercises      General Comments        Pertinent Vitals/Pain Pain Assessment: No/denies pain    Home Living                      Prior Function  PT Goals (current goals can now be found in the care plan section) Acute Rehab PT Goals Patient Stated Goal: To go to rehab prior to returning home PT Goal Formulation: With patient/family Time For Goal Achievement: 11/18/20 Potential to Achieve Goals: Good Progress towards PT goals: Progressing toward goals    Frequency    Min 2X/week      PT Plan Current plan remains appropriate    Co-evaluation              AM-PAC PT "6 Clicks" Mobility   Outcome Measure  Help  needed turning from your back to your side while in a flat bed without using bedrails?: None Help needed moving from lying on your back to sitting on the side of a flat bed without using bedrails?: None Help needed moving to and from a bed to a chair (including a wheelchair)?: A Little Help needed standing up from a chair using your arms (e.g., wheelchair or bedside chair)?: None Help needed to walk in hospital room?: None Help needed climbing 3-5 steps with a railing? : A Little 6 Click Score: 22    End of Session Equipment Utilized During Treatment: Gait belt Activity Tolerance: Patient tolerated treatment well Patient left: in chair;with call bell/phone within reach;with family/visitor present;with chair alarm set Nurse Communication: Mobility status PT Visit Diagnosis: Muscle weakness (generalized) (M62.81);Unsteadiness on feet (R26.81)     Time: 6067-7034 PT Time Calculation (min) (ACUTE ONLY): 29 min  Charges:  $Gait Training: 23-37 mins                     Esteven Overfelt, PT, GCS 11/06/20,11:10 AM

## 2020-11-06 NOTE — Progress Notes (Signed)
Date of Admission:  10/31/2020      ID: Kevin Terrell is a 80 y.o. male  Principal Problem:   Syncope Active Problems:   Hypothyroidism   Gastroesophageal reflux disease without esophagitis   Abdominal pain   Dilation of pancreatic duct   Dehydration   Acute pancreatitis   Peritonitis (HCC)    Subjective: Pt sitting in chair and chatting with daughter Has some guarding of the abdomen he says No fever No nausea  Medications:  . enoxaparin (LOVENOX) injection  40 mg Subcutaneous Q24H  . fluticasone  1 spray Each Nare BID  . gabapentin  300 mg Oral BID  . levothyroxine  50 mcg Oral Q0600  . loratadine  10 mg Oral Daily  . meloxicam  15 mg Oral Daily  . montelukast  10 mg Oral QHS  . ondansetron (ZOFRAN) IV  4 mg Intravenous Q6H  . pantoprazole (PROTONIX) IV  40 mg Intravenous Q24H  . potassium chloride  40 mEq Oral Q2H  . senna-docusate  2 tablet Oral BID  . simethicone  80 mg Oral QID    Objective: Vital signs in last 24 hours: Patient Vitals for the past 24 hrs:  BP Temp Temp src Pulse Resp SpO2 Weight  11/06/20 0844 132/73 98.7 F (37.1 C) - 80 18 98 % -  11/06/20 0600 - - - - - - 88.5 kg  11/06/20 0408 133/77 98.7 F (37.1 C) Oral 64 17 100 % -  11/06/20 0021 122/77 98.2 F (36.8 C) Oral 73 18 98 % -  11/05/20 2045 (!) 150/82 98.7 F (37.1 C) Oral 81 18 98 % -  11/05/20 1648 122/75 97.9 F (36.6 C) Oral 64 15 100 % -  11/05/20 1159 (!) 116/59 97.6 F (36.4 C) - 71 17 92 % -   PHYSICAL EXAM:  General: Alert, cooperative, no distress,  Head: Normocephalic, without obvious abnormality, atraumatic. Eyes: Conjunctivae clear, anicteric sclerae. Pupils are equal ENT Nares normal. No drainage or sinus tenderness. Lips, mucosa, and tongue normal. No Thrush Neck: Supple, symmetrical, no adenopathy, thyroid: non tender no carotid bruit and no JVD. Back: No CVA tenderness. Lungs: b/l air entry- few crepts bases Heart: Regular rate and rhythm, no murmur, rub  or gallop. Abdomen: Soft, 4 areas of lap entry point-clean , no erythema  Extremities: atraumatic, no cyanosis. No edema. No clubbing Skin: No rashes or lesions. Or bruising Lymph: Cervical, supraclavicular normal. Neurologic: Grossly non-focal  Lab Results Recent Labs    11/05/20 0445 11/06/20 0827  WBC 10.8* 14.3*  HGB 11.0* 11.7*  HCT 31.3* 33.6*  NA 135 138  K 3.8 3.3*  CL 101 103  CO2 26 27  BUN 18 17  CREATININE 0.68 0.72   Liver Panel Recent Labs    11/06/20 0827  PROT 5.9*  ALBUMIN 2.9*  AST 34  ALT 26  ALKPHOS 56  BILITOT 0.6  BILIDIR <0.1  IBILI NOT CALCULATED  peritoneal fluid 13K wbc -92% N Microbiology: Peritoneal culture - Ng so far Blood culture 11/01/20 - NG Studies/Results: US LIVER DOPPLER  Result Date: 11/05/2020 CLINICAL DATA:  Hemangioma? EXAM: DUPLEX ULTRASOUND OF LIVER TECHNIQUE: Color and duplex Doppler ultrasound was performed to evaluate the hepatic in-flow and out-flow vessels. COMPARISON:  CT abdomen pelvis with contrast 11/02/2020 MRI abdomen 11/01/2020 FINDINGS: Liver: Normal parenchymal echogenicity. Normal hepatic contour without nodularity. 2.4 x 2.3 x 2.2 cm echogenic lesion seen in the dome of the right hepatic lobe consistent with hemangioma seen on  recent MRI. This lesion is better assessed on the recent MRI. Main Portal Vein size: 0.8 cm Portal Vein Velocities Main Prox:  49 cm/sec Main Mid: 50 cm/sec Main Dist:  52 cm/sec Right: 27 cm/sec Left: 40 cm/sec Hepatic Vein Velocities Right:  32 cm/sec Middle:  27 cm/sec Left:  32 cm/sec IVC: Present and patent with normal respiratory phasicity. Hepatic Artery Velocity:  48 cm/sec Splenic Vein Velocity:  27 cm/sec Spleen: 6.6 cm x 3.4 cm x 9.8 cm with a total volume of 115 cm^3 (411 cm^3 is upper limit normal) Portal Vein Occlusion/Thrombus: No Splenic Vein Occlusion/Thrombus: No Ascites: None Varices: None Small bilateral pleural effusions partially visualized. IMPRESSION: Echogenic lesion in  the dome of the right hepatic lobe measuring 2.4 x 2.2 x 2.3 cm is consistent with a hemangioma given appearance on recent MRI. Electronically Signed   By: Miachel Roux M.D.   On: 11/05/2020 12:22     Assessment/Plan:  Acute pancreatitis due to gall stones S/p lap cholecystectomy- pathology shows chronic cholecystitis and cholelithiasis  Purulent peritoneal fluid as evidenced by cell count- because of increase in blood leucocytes and peritoneal fluid showing neurtophilic predominance, and him having some abd guarding  will start him on IV ceftriaxone and IV flagyl pending culture. Further antibiotic on discharge will depend on his clinical progress and culture result  Anemia Hypokalemia and hypomagnesemia  H/o ca lung, s/p lobectomy   Discussed the management with patient, his daughter and Dr.Zhang

## 2020-11-07 ENCOUNTER — Ambulatory Visit: Payer: Medicare Other

## 2020-11-07 DIAGNOSIS — K851 Biliary acute pancreatitis without necrosis or infection: Secondary | ICD-10-CM | POA: Diagnosis not present

## 2020-11-07 DIAGNOSIS — D649 Anemia, unspecified: Secondary | ICD-10-CM | POA: Diagnosis not present

## 2020-11-07 DIAGNOSIS — K650 Generalized (acute) peritonitis: Secondary | ICD-10-CM | POA: Diagnosis not present

## 2020-11-07 DIAGNOSIS — K8010 Calculus of gallbladder with chronic cholecystitis without obstruction: Secondary | ICD-10-CM | POA: Diagnosis not present

## 2020-11-07 DIAGNOSIS — Z9049 Acquired absence of other specified parts of digestive tract: Secondary | ICD-10-CM

## 2020-11-07 LAB — BASIC METABOLIC PANEL
Anion gap: 6 (ref 5–15)
BUN: 11 mg/dL (ref 8–23)
CO2: 27 mmol/L (ref 22–32)
Calcium: 8.1 mg/dL — ABNORMAL LOW (ref 8.9–10.3)
Chloride: 104 mmol/L (ref 98–111)
Creatinine, Ser: 0.7 mg/dL (ref 0.61–1.24)
GFR, Estimated: >60 mL/min (ref >60)
Glucose, Bld: 116 mg/dL — ABNORMAL HIGH (ref 70–99)
Potassium: 4.6 mmol/L (ref 3.5–5.1)
Sodium: 137 mmol/L (ref 135–145)

## 2020-11-07 LAB — PROCALCITONIN: Procalcitonin: 0.21 ng/mL

## 2020-11-07 LAB — CBC WITH DIFFERENTIAL/PLATELET
Abs Immature Granulocytes: 0.12 10*3/uL — ABNORMAL HIGH (ref 0.00–0.07)
Basophils Absolute: 0 10*3/uL (ref 0.0–0.1)
Basophils Relative: 0 %
Eosinophils Absolute: 0.5 10*3/uL (ref 0.0–0.5)
Eosinophils Relative: 4 %
HCT: 31.9 % — ABNORMAL LOW (ref 39.0–52.0)
Hemoglobin: 10.6 g/dL — ABNORMAL LOW (ref 13.0–17.0)
Immature Granulocytes: 1 %
Lymphocytes Relative: 8 %
Lymphs Abs: 1 10*3/uL (ref 0.7–4.0)
MCH: 30.5 pg (ref 26.0–34.0)
MCHC: 33.2 g/dL (ref 30.0–36.0)
MCV: 91.7 fL (ref 80.0–100.0)
Monocytes Absolute: 1.8 10*3/uL — ABNORMAL HIGH (ref 0.1–1.0)
Monocytes Relative: 13 %
Neutro Abs: 9.8 10*3/uL — ABNORMAL HIGH (ref 1.7–7.7)
Neutrophils Relative %: 74 %
Platelets: 191 10*3/uL (ref 150–400)
RBC: 3.48 MIL/uL — ABNORMAL LOW (ref 4.22–5.81)
RDW: 12.6 % (ref 11.5–15.5)
WBC: 13.2 10*3/uL — ABNORMAL HIGH (ref 4.0–10.5)
nRBC: 0 % (ref 0.0–0.2)

## 2020-11-07 MED ORDER — HYDROCODONE-ACETAMINOPHEN 5-325 MG PO TABS: 1.0000 | ORAL_TABLET | Freq: Four times a day (QID) | ORAL | Status: DC | PRN

## 2020-11-07 MED ORDER — ACETAMINOPHEN 325 MG PO TABS
650.0000 mg | ORAL_TABLET | Freq: Four times a day (QID) | ORAL | Status: DC | PRN
  Administered 2020-11-07 – 2020-11-08 (×2): 650 mg via ORAL
  Filled 2020-11-07 (×2): qty 2

## 2020-11-07 NOTE — Plan of Care (Signed)
End of Shift Summary:  Alert and oriented x4. VSS. Remained on room air, sats >95%. UOP adequate. (-) BM, (+) flatus overnight. Pain managed with prn medications. No c/o of nausea. IV abx per MAR. Ambulated in room and up to bathroom with walker and standby assist. Remained free from falls or injury. Call bell within reach and able to use

## 2020-11-07 NOTE — Progress Notes (Signed)
   Date of Admission:  10/31/2020    ID: Kevin Terrell is a 80 y.o. male  Principal Problem:   Syncope Active Problems:   Hypothyroidism   Gastroesophageal reflux disease without esophagitis   Abdominal pain   Dilation of pancreatic duct   Dehydration   Acute pancreatitis   Peritonitis (HCC)    Subjective: Pt feels less pain abdomen No fever or nausea  Medications:  . enoxaparin (LOVENOX) injection  40 mg Subcutaneous Q24H  . fluticasone  1 spray Each Nare BID  . gabapentin  300 mg Oral BID  . levothyroxine  50 mcg Oral Q0600  . loratadine  10 mg Oral Daily  . meloxicam  15 mg Oral Daily  . montelukast  10 mg Oral QHS  . ondansetron (ZOFRAN) IV  4 mg Intravenous Q6H  . pantoprazole (PROTONIX) IV  40 mg Intravenous Q24H  . senna-docusate  2 tablet Oral BID    Objective: Vital signs in last 24 hours: Patient Vitals for the past 24 hrs:  BP Temp Temp src Pulse Resp SpO2 Weight  11/07/20 1130 119/75 98.4 F (36.9 C) Oral 85 18 100 % --  11/07/20 0759 117/76 99.1 F (37.3 C) Oral (!) 47 18 90 % --  11/07/20 0435 137/71 98.3 F (36.8 C) Oral 91 19 97 % --  11/07/20 0236 -- -- -- -- -- -- 93.9 kg  11/06/20 1948 120/71 98.2 F (36.8 C) -- 88 17 98 % --  11/06/20 1530 97/64 98.2 F (36.8 C) -- 86 20 100 % --   PHYSICAL EXAM:  General: Alert, cooperative, no distress,  Lungs: Clear to auscultation bilaterally. No Wheezing or Rhonchi. No rales. Heart: s1s2 Abdomen: Soft, minimal distension at the site of the lap entry areas which are four in number  Extremities: atraumatic, no cyanosis. No edema. No clubbing Skin: No rashes or lesions. Or bruising Lymph: Cervical, supraclavicular normal. Neurologic: Grossly non-focal  Lab Results Recent Labs    11/06/20 0827 11/07/20 0632  WBC 14.3* 13.2*  HGB 11.7* 10.6*  HCT 33.6* 31.9*  NA 138 137  K 3.3* 4.6  CL 103 104  CO2 27 27  BUN 17 11  CREATININE 0.72 0.70   Liver Panel Recent Labs    11/06/20 0827  PROT  5.9*  ALBUMIN 2.9*  AST 34  ALT 26  ALKPHOS 56  BILITOT 0.6  BILIDIR <0.1  IBILI NOT CALCULATED  Microbiology: Peritoneal fluid culture Studies/Results: No results found.   Assessment/Plan: Acute pancreatitis due to gallstones Status post lap cholecystectomy.  Pathology shows chronic cholecystitis and cholelithiasis  Purulent peritonitis.  As evidenced by high white count in the peritoneal fluid.  Cultures so far is negative.  This was sent when he was not on any antibiotics  Currently on ceftriaxone and IV Flagyl day 2.  We will give  3 days IV and then switch him over to p.o. Augmentin If cultures remain negative.  Anemia  Hypokalemia and hypomagnesemia have resolved  History of CA lung status post lobectomy  Discussed the management with the patient and his family and hospitalist.

## 2020-11-07 NOTE — Care Management Important Message (Signed)
Important Message  Patient Details  Name: Kevin Terrell MRN: [id number] Date of Birth: 1940/12/14   Medicare Important Message Given:  Yes     Juliann Pulse A Alla Sloma 11/07/2020, 10:47 AM

## 2020-11-07 NOTE — Evaluation (Signed)
Occupational Therapy Re-evaluation Patient Details Name: Kevin Terrell MRN: [id number] DOB: 1941/08/20 Today's Date: 11/07/2020    History of Present Illness 80 yr old man who presented to [facility] ED on 10/31/2020 with complaints of syncopal episode.  He also had a 1 day of acute abdominal pain.  MRCP showed acute pancreatitis with dilated pancreatic duct and CBD.  Patient has been seen by GI, suspect patient had passed a gallstone.  To prevent recurrence of pancreatitis, cholecystectomy was performed 3/6.   Clinical Impression   Pt seen for OT re-evaluation on this date following general anesthesia for cholecystectomy. Upon arrival to room, pt awake, sitting upright in bed with 2 daughters at bedside. Pt A&O x 4 and reporting no pain. Pt agreeable to eval and tx. This date, pt able to complete standing grooming tasks, toileting, and functional mobility of ~271ft with RW and SUPERVISION. Pt presents with improved balance and decreased pain and dizziness since initial evaluation, however pt expressed concern regarding returning to home alone with current activity tolerance. Pt is making good progress toward goals and continues to benefit from skilled OT services to maximize return to PLOF and minimize risk of future falls, injury, caregiver burden, and readmission. Will continue to follow POC. Discharge recommendation remains appropriate.     Follow Up Recommendations  SNF    Equipment Recommendations  Other (comment) (defer)       Precautions / Restrictions Precautions Precautions: Fall Restrictions Weight Bearing Restrictions: No      Mobility Bed Mobility Overal bed mobility: Modified Independent             General bed mobility comments: Increased time/effort    Transfers Overall transfer level: Needs assistance Equipment used: Rolling walker (2 wheeled) Transfers: Sit to/from Stand Sit to Stand: Supervision         General transfer comment: reports no dizziness     Balance Overall balance assessment: Modified Independent Sitting-balance support: No upper extremity supported;Feet unsupported Sitting balance-Leahy Scale: Good Sitting balance - Comments: steady sitting edge of bed   Standing balance support: Bilateral upper extremity supported;During functional activity Standing balance-Leahy Scale: Good Standing balance comment: Pt able to complete standing grooming tasks, toileting, and functional mobility of ~220ft with RW and supervision only                           ADL either performed or assessed with clinical judgement   ADL Overall ADL's : Needs assistance/impaired     Grooming: Wash/dry hands;Supervision/safety;Standing                   Toilet Transfer: Supervision/safety;Ambulation;Regular Toilet;Grab bars;RW [employer] technical [employer] Details (indicate cue type and reason): Required increased time/effort Toileting- Clothing Manipulation and Hygiene: Supervision/safety;Sit to/from stand       Functional mobility during ADLs: Supervision/safety;Rolling walker       Vision Baseline Vision/History: Wears glasses Wears Glasses: At all times              Pertinent Vitals/Pain Pain Assessment: 0-10 Pain Score: 3  Pain Location: abdomen        Extremity/Trunk Assessment Upper Extremity Assessment Upper Extremity Assessment: Overall WFL for tasks assessed   Lower Extremity Assessment Lower Extremity Assessment: Overall WFL for tasks assessed   Cervical / Trunk Assessment Cervical / Trunk Assessment: Normal      Cognition Arousal/Alertness: Awake/alert Behavior During Therapy: WFL for tasks assessed/performed Overall Cognitive Status: Within Functional Limits for tasks assessed  General Comments: Pleasant and agreeable. Expressed some concern regarding return to home with current activity tolerance                          OT Problem List: Decreased  strength;Decreased activity tolerance;Impaired balance (sitting and/or standing)      OT Treatment/Interventions: Self-care/ADL training;Therapeutic exercise;Energy conservation;DME and/or AE instruction;Therapeutic activities;Patient/family education;Balance training    OT Goals(Current goals can be found in the care plan section) Acute Rehab OT Goals Patient Stated Goal: To go to rehab prior to returning home OT Goal Formulation: With patient Time For Goal Achievement: 11/21/20 Potential to Achieve Goals: Good  OT Frequency: Min 1X/week    AM-PAC OT "6 Clicks" Daily Activity     Outcome Measure Help from another person eating meals?: None Help from another person taking care of personal grooming?: None Help from another person toileting, which includes using toliet, bedpan, or urinal?: A Little Help from another person bathing (including washing, rinsing, drying)?: A Little Help from another person to put on and taking off regular upper body clothing?: None Help from another person to put on and taking off regular lower body clothing?: A Little 6 Click Score: 21   End of Session Equipment Utilized During Treatment: Gait belt;Rolling walker Nurse Communication: Mobility status  Activity Tolerance: Patient tolerated treatment well Patient left: in bed;with call bell/phone within reach;with family/visitor present  OT Visit Diagnosis: Unsteadiness on feet (R26.81);History of falling (Z91.81)                Time: 3254-9826 OT Time Calculation (min): 23 min Charges:  OT General Charges $OT Visit: 1 Visit OT Evaluation $OT Re-eval: 1 Re-eval OT Treatments $Self Care/Home Management : 8-22 mins  Fredirick Maudlin, OTR/L [place]

## 2020-11-07 NOTE — Telephone Encounter (Signed)
I see him on 3/15 and will discuss his CT findings

## 2020-11-07 NOTE — Progress Notes (Signed)
PROGRESS NOTE    Kevin Terrell  XHB:[id number] DOB: December 14, 1940 DOA: 10/31/2020 PCP: [name], MD    Brief Narrative:  The patient is a 80 yr old man who presented to [facility] ED on 10/31/2020 with complaints of syncopal episode.He also had a 1 day of acute abdominal pain. MRCP showed acute pancreatitis with dilated pancreatic duct and CBD. Patient has been seen by GI, suspect patient hadpassed a gallstone. To prevent recurrence of pancreatitis, cholecystectomy was performed 3/6. CT and MRI also showed significant lymphadenopathy within the abdomen. Patient has been seen by oncology, will follow up as outpatient. However, during the cholecystectomy, patient will drain out about 500 mL of purulent ascites. Culture so far negative for any pathogen.   Assessment & Plan:   Principal Problem:   Syncope Active Problems:   Hypothyroidism   Gastroesophageal reflux disease without esophagitis   Abdominal pain   Dilation of pancreatic duct   Dehydration   Acute pancreatitis   Peritonitis ([place])  #1.  Peritonitis. After discussion with ID yesterday, decision was made to treat her with antibiotics, started Flagyl and cefepime. Initial culture from ascites is negative in 24 hours. Leukocytosis is better today. Discussed with ID again, will continue antibiotic today and tomorrow. Will consider oral antibiotics after that.  2.  Acute gallstone pancreatitis Cholelithiasis. Condition had improved, status post cholecystectomy.  3.  Syncope with dehydration. Hyponatremia. Improved.    DVT prophylaxis: Lovenox Code Status: full Family Communication:  Disposition Plan:  .   Status is: Inpatient  Remains inpatient appropriate because:Inpatient level of care appropriate due to severity of illness   Dispo: The patient is from: Home              Anticipated d/c is to: SNF              Patient currently is not medically stable to d/c.   Difficult to place patient  No        I/O last 3 completed shifts: In: 1120 [P.O.:1020; IV Piggyback:100] Out: 1 [Urine:1] Total I/O In: 360 [P.O.:360] Out: -      Consultants:   ID, General surgery  Procedures: Cholecystectomy.  Antimicrobials: Cefepime and Flagyl  Subjective: Patient doing well today, he had some lower abdominal pain yesterday, pain has been better today.  No nausea vomiting. Denies any fever chills. No short of breath or cough. No dysuria or hematuria.  Objective: Vitals:   11/06/20 1948 11/07/20 0236 11/07/20 0435 11/07/20 0759  BP: 120/71  137/71 117/76  Pulse: 88  91 (!) 47  Resp: 17  19 18   Temp: 98.2 F (36.8 C)  98.3 F (36.8 C) 99.1 F (37.3 C)  TempSrc:   Oral Oral  SpO2: 98%  97% 90%  Weight:  93.9 kg    Height:        Intake/Output Summary (Last 24 hours) at 11/07/2020 1027 Last data filed at 11/07/2020 1003 Gross per 24 hour  Intake 1360 ml  Output 1 ml  Net 1359 ml   Filed Weights   11/02/20 0500 11/06/20 0600 11/07/20 0236  Weight: 86.2 kg 88.5 kg 93.9 kg    Examination:  General exam: Appears calm and comfortable  Respiratory system: Clear to auscultation. Respiratory effort normal. Cardiovascular system: S1 & S2 heard, RRR. No JVD, murmurs, rubs, gallops or clicks. No pedal edema. Gastrointestinal system: Abdomen is nondistended, soft and nontender. No organomegaly or masses felt. Normal bowel sounds heard. Central nervous system: Alert and oriented. No focal  neurological deficits. Extremities: Symmetric 5 x 5 power. Skin: No rashes, lesions or ulcers Psychiatry: Judgement and insight appear normal. Mood & affect appropriate.     Data Reviewed: I have personally reviewed following labs and imaging studies  CBC: Recent Labs  Lab 11/03/20 0822 11/04/20 0518 11/05/20 0445 11/06/20 0827 11/07/20 0632  WBC 20.0* 12.4* 10.8* 14.3* 13.2*  NEUTROABS 16.6* 10.3* 10.0* 11.1* 9.8*  HGB 14.2 10.7* 11.0* 11.7* 10.6*  HCT 42.0 31.1* 31.3* 33.6*  31.9*  MCV 91.3 89.1 89.7 89.8 91.7  PLT 145* 128* 138* 189 161   Basic Metabolic Panel: Recent Labs  Lab 10/31/20 2041 11/01/20 0434 11/02/20 0702 11/03/20 0822 11/04/20 0518 11/05/20 0445 11/06/20 0827 11/07/20 0632  NA  --  133*   < > 129* 134* 135 138 137  K  --  4.2   < > 4.1 3.6 3.8 3.3* 4.6  CL  --  99   < > 96* 103 101 103 104  CO2  --  24   < > 21* 27 26 27 27   GLUCOSE  --  139*   < > 91 105* 127* 87 116*  BUN  --  19   < > 29* 16 18 17 11   CREATININE  --  0.77   < > 0.90 0.65 0.68 0.72 0.70  CALCIUM  --  8.0*   < > 7.5* 8.0* 8.0* 8.0* 8.1*  MG 2.1 2.0  --   --   --   --  2.3  --   PHOS  --  3.8  --   --   --   --   --   --    < > = values in this interval not displayed.   GFR: Estimated Creatinine Clearance: 86 mL/min (by C-G formula based on SCr of 0.7 mg/dL). Liver Function Tests: Recent Labs  Lab 10/31/20 1757 11/01/20 0434 11/02/20 0702 11/03/20 0822 11/06/20 0827  AST 26 24 26 21  34  ALT 11 12 12 11 26   ALKPHOS 54 48 58 64 56  BILITOT 1.3* 0.9 0.6 0.8 0.6  PROT 7.1 6.0* 6.2* 5.6* 5.9*  ALBUMIN 4.2 3.5 3.2* 2.8* 2.9*   Recent Labs  Lab 10/31/20 2041 11/02/20 0702  LIPASE 2,209* 150*   No results for input(s): AMMONIA in the last 168 hours. Coagulation Profile: Recent Labs  Lab 11/01/20 0434 11/04/20 0518  INR 1.3* 1.4*   Cardiac Enzymes: No results for input(s): CKTOTAL, CKMB, CKMBINDEX, TROPONINI in the last 168 hours. BNP (last 3 results) No results for input(s): PROBNP in the last 8760 hours. HbA1C: No results for input(s): HGBA1C in the last 72 hours. CBG: No results for input(s): GLUCAP in the last 168 hours. Lipid Profile: No results for input(s): CHOL, HDL, LDLCALC, TRIG, CHOLHDL, LDLDIRECT in the last 72 hours. Thyroid Function Tests: No results for input(s): TSH, T4TOTAL, FREET4, T3FREE, THYROIDAB in the last 72 hours. Anemia Panel: No results for input(s): VITAMINB12, FOLATE, FERRITIN, TIBC, IRON, RETICCTPCT in the last 72  hours. Sepsis Labs: Recent Labs  Lab 11/07/20 0960  PROCALCITON 0.21    Recent Results (from the past 240 hour(s))  SARS CORONAVIRUS 2 (TAT 6-24 HRS) Nasopharyngeal Nasopharyngeal Swab     Status: None   Collection Time: 10/31/20 10:00 PM   Specimen: Nasopharyngeal Swab  Result Value Ref Range Status   SARS Coronavirus 2 NEGATIVE NEGATIVE Final    Comment: (NOTE) SARS-CoV-2 target nucleic acids are NOT DETECTED.  The SARS-CoV-2 RNA is generally  detectable in upper and lower respiratory specimens during the acute phase of infection. Negative results do not preclude SARS-CoV-2 infection, do not rule out co-infections with other pathogens, and should not be used as the sole basis for treatment or other patient management decisions. Negative results must be combined with clinical observations, patient history, and epidemiological information. The expected result is Negative.  Fact Sheet for Patients: SugarRoll.be  Fact Sheet for Healthcare Providers: https://www.woods-mathews.com/  This test is not yet approved or cleared by the [place] FDA and  has been authorized for detection and/or diagnosis of SARS-CoV-2 by FDA under an Emergency Use Authorization (EUA). This EUA will remain  in effect (meaning this test can be used) for the duration of the COVID-19 declaration under Se ction 564(b)(1) of the Act, 21 U.S.C. section 360bbb-3(b)(1), unless the authorization is terminated or revoked sooner.  Performed at [facility], [place] [address], [place], [place] [address]   CULTURE, BLOOD (ROUTINE X 2) w Reflex to ID Panel     Status: None   Collection Time: 11/01/20 10:56 AM   Specimen: BLOOD  Result Value Ref Range Status   Specimen Description BLOOD LEFT ANTECUBITAL  Final   Special Requests   Final    BOTTLES DRAWN AEROBIC AND ANAEROBIC Blood Culture results may not be optimal due to an excessive volume of blood received in  culture bottles   Culture   Final    NO GROWTH 5 DAYS Performed at [facility], [address] [place], [place] [address]    Report Status 11/06/2020 FINAL  Final  CULTURE, BLOOD (ROUTINE X 2) w Reflex to ID Panel     Status: None   Collection Time: 11/01/20 12:38 PM   Specimen: BLOOD  Result Value Ref Range Status   Specimen Description BLOOD RIGHT ANTECUBITAL  Final   Special Requests   Final    BOTTLES DRAWN AEROBIC AND ANAEROBIC Blood Culture adequate volume   Culture   Final    NO GROWTH 5 DAYS Performed at [facility], [address], [place], [place] [address]    Report Status 11/06/2020 FINAL  Final  Aerobic/Anaerobic Culture w Gram Stain (surgical/deep wound)     Status: None (Preliminary result)   Collection Time: 11/04/20  8:01 PM   Specimen: PATH Other; Body Fluid  Result Value Ref Range Status   Specimen Description   Final    FLUID abdominal Performed at [facility], [address] [place], [place] [address]    Special Requests   Final    NONE Performed at [facility], [place],  [address]    Gram Stain   Final    MODERATE WBC PRESENT,BOTH PMN AND MONONUCLEAR NO ORGANISMS SEEN    Culture   Final    NO GROWTH 1 DAY Performed at [facility], [place] [address], [place],  [address]    Report Status PENDING  Incomplete         Radiology Studies: US LIVER DOPPLER  Result Date: 11/05/2020 CLINICAL DATA:  Hemangioma? EXAM: DUPLEX ULTRASOUND OF LIVER TECHNIQUE: Color and duplex Doppler ultrasound was performed to evaluate the hepatic in-flow and out-flow vessels. COMPARISON:  CT abdomen pelvis with contrast 11/02/2020 MRI abdomen 11/01/2020 FINDINGS: Liver: Normal parenchymal echogenicity. Normal hepatic contour without nodularity. 2.4 x 2.3 x 2.2 cm echogenic lesion seen in the dome of the right hepatic lobe consistent with hemangioma seen on recent MRI. This lesion is better  assessed  on the recent MRI. Main Portal Vein size: 0.8 cm Portal Vein Velocities Main Prox:  49 cm/sec Main Mid: 50 cm/sec Main Dist:  52 cm/sec Right: 27 cm/sec Left: 40 cm/sec Hepatic Vein Velocities Right:  32 cm/sec Middle:  27 cm/sec Left:  32 cm/sec IVC: Present and patent with normal respiratory phasicity. Hepatic Artery Velocity:  48 cm/sec Splenic Vein Velocity:  27 cm/sec Spleen: 6.6 cm x 3.4 cm x 9.8 cm with a total volume of 115 cm^3 (411 cm^3 is upper limit normal) Portal Vein Occlusion/Thrombus: No Splenic Vein Occlusion/Thrombus: No Ascites: None Varices: None Small bilateral pleural effusions partially visualized. IMPRESSION: Echogenic lesion in the dome of the right hepatic lobe measuring 2.4 x 2.2 x 2.3 cm is consistent with a hemangioma given appearance on recent MRI. Electronically Signed   By: Miachel Roux M.D.   On: 11/05/2020 12:22        Scheduled Meds: . enoxaparin (LOVENOX) injection  40 mg Subcutaneous Q24H  . fluticasone  1 spray Each Nare BID  . gabapentin  300 mg Oral BID  . levothyroxine  50 mcg Oral Q0600  . loratadine  10 mg Oral Daily  . meloxicam  15 mg Oral Daily  . montelukast  10 mg Oral QHS  . ondansetron (ZOFRAN) IV  4 mg Intravenous Q6H  . pantoprazole (PROTONIX) IV  40 mg Intravenous Q24H  . senna-docusate  2 tablet Oral BID   Continuous Infusions: . cefTRIAXone (ROCEPHIN)  IV 2 g (11/06/20 1357)  . metronidazole 500 mg (11/07/20 0540)     LOS: 7 days    Time spent: 28 minutes    Sharen Hones, MD [facility]   To contact the attending provider between 7A-7P or the covering provider during after hours 7P-7A, please log into the web site [email or web address] and access using universal [place] password for that web site. If you do not have the password, please call the hospital operator.  11/07/2020, 10:27 AM

## 2020-11-07 NOTE — Plan of Care (Signed)
  Problem: Clinical Measurements: Goal: Will remain free from infection Outcome: Progressing Goal: Diagnostic test results will improve Outcome: Progressing   Problem: Activity: Goal: Risk for activity intolerance will decrease Outcome: Progressing   Problem: Nutrition: Goal: Adequate nutrition will be maintained Outcome: Progressing   Problem: Elimination: Goal: Will not experience complications related to bowel motility Outcome: Progressing Goal: Will not experience complications related to urinary retention Outcome: Progressing   Problem: Pain Managment: Goal: General experience of comfort will improve Outcome: Progressing   Problem: Safety: Goal: Ability to remain free from injury will improve Outcome: Progressing   Problem: Education: Goal: Knowledge of General Education information will improve Description: Including pain rating scale, medication(s)/side effects and non-pharmacologic comfort measures Outcome: Progressing   Problem: Health Behavior/Discharge Planning: Goal: Ability to manage health-related needs will improve Outcome: Progressing   Problem: Clinical Measurements: Goal: Ability to maintain clinical measurements within normal limits will improve Outcome: Progressing Goal: Will remain free from infection Outcome: Progressing Goal: Diagnostic test results will improve Outcome: Progressing Goal: Respiratory complications will improve Outcome: Progressing Goal: Cardiovascular complication will be avoided Outcome: Progressing   Problem: Activity: Goal: Risk for activity intolerance will decrease Outcome: Progressing   Problem: Nutrition: Goal: Adequate nutrition will be maintained Outcome: Progressing   Problem: Coping: Goal: Level of anxiety will decrease Outcome: Progressing   Problem: Elimination: Goal: Will not experience complications related to bowel motility Outcome: Progressing   Problem: Pain Managment: Goal: General experience  of comfort will improve Outcome: Progressing   Problem: Safety: Goal: Ability to remain free from injury will improve Outcome: Progressing   Problem: Skin Integrity: Goal: Risk for impaired skin integrity will decrease Outcome: Progressing

## 2020-11-08 ENCOUNTER — Inpatient Hospital Stay: Payer: Medicare Other | Admitting: Infectious Diseases

## 2020-11-08 ENCOUNTER — Telehealth: Payer: Self-pay | Admitting: *Deleted

## 2020-11-08 DIAGNOSIS — E7849 Other hyperlipidemia: Secondary | ICD-10-CM | POA: Diagnosis not present

## 2020-11-08 DIAGNOSIS — E039 Hypothyroidism, unspecified: Secondary | ICD-10-CM | POA: Diagnosis not present

## 2020-11-08 DIAGNOSIS — R278 Other lack of coordination: Secondary | ICD-10-CM | POA: Diagnosis not present

## 2020-11-08 DIAGNOSIS — K851 Biliary acute pancreatitis without necrosis or infection: Secondary | ICD-10-CM | POA: Diagnosis not present

## 2020-11-08 DIAGNOSIS — Z791 Long term (current) use of non-steroidal anti-inflammatories (NSAID): Secondary | ICD-10-CM | POA: Diagnosis not present

## 2020-11-08 DIAGNOSIS — K219 Gastro-esophageal reflux disease without esophagitis: Secondary | ICD-10-CM | POA: Diagnosis not present

## 2020-11-08 DIAGNOSIS — K659 Peritonitis, unspecified: Secondary | ICD-10-CM | POA: Diagnosis not present

## 2020-11-08 DIAGNOSIS — Z9049 Acquired absence of other specified parts of digestive tract: Secondary | ICD-10-CM | POA: Diagnosis not present

## 2020-11-08 DIAGNOSIS — G8929 Other chronic pain: Secondary | ICD-10-CM | POA: Diagnosis not present

## 2020-11-08 DIAGNOSIS — M19012 Primary osteoarthritis, left shoulder: Secondary | ICD-10-CM | POA: Diagnosis not present

## 2020-11-08 DIAGNOSIS — Z09 Encounter for follow-up examination after completed treatment for conditions other than malignant neoplasm: Secondary | ICD-10-CM | POA: Diagnosis present

## 2020-11-08 DIAGNOSIS — Z79899 Other long term (current) drug therapy: Secondary | ICD-10-CM | POA: Diagnosis not present

## 2020-11-08 DIAGNOSIS — K8010 Calculus of gallbladder with chronic cholecystitis without obstruction: Secondary | ICD-10-CM | POA: Diagnosis not present

## 2020-11-08 DIAGNOSIS — D649 Anemia, unspecified: Secondary | ICD-10-CM | POA: Diagnosis not present

## 2020-11-08 DIAGNOSIS — K650 Generalized (acute) peritonitis: Secondary | ICD-10-CM | POA: Diagnosis not present

## 2020-11-08 DIAGNOSIS — Z888 Allergy status to other drugs, medicaments and biological substances status: Secondary | ICD-10-CM | POA: Diagnosis not present

## 2020-11-08 DIAGNOSIS — Z741 Need for assistance with personal care: Secondary | ICD-10-CM | POA: Diagnosis not present

## 2020-11-08 DIAGNOSIS — M542 Cervicalgia: Secondary | ICD-10-CM | POA: Diagnosis not present

## 2020-11-08 DIAGNOSIS — Z48815 Encounter for surgical aftercare following surgery on the digestive system: Secondary | ICD-10-CM | POA: Diagnosis not present

## 2020-11-08 DIAGNOSIS — R2689 Other abnormalities of gait and mobility: Secondary | ICD-10-CM | POA: Diagnosis not present

## 2020-11-08 DIAGNOSIS — R55 Syncope and collapse: Secondary | ICD-10-CM | POA: Diagnosis not present

## 2020-11-08 DIAGNOSIS — M47816 Spondylosis without myelopathy or radiculopathy, lumbar region: Secondary | ICD-10-CM | POA: Diagnosis not present

## 2020-11-08 DIAGNOSIS — M24012 Loose body in left shoulder: Secondary | ICD-10-CM | POA: Diagnosis not present

## 2020-11-08 DIAGNOSIS — M25512 Pain in left shoulder: Secondary | ICD-10-CM | POA: Diagnosis not present

## 2020-11-08 DIAGNOSIS — Z85118 Personal history of other malignant neoplasm of bronchus and lung: Secondary | ICD-10-CM | POA: Diagnosis not present

## 2020-11-08 DIAGNOSIS — K859 Acute pancreatitis without necrosis or infection, unspecified: Secondary | ICD-10-CM | POA: Diagnosis not present

## 2020-11-08 DIAGNOSIS — E876 Hypokalemia: Secondary | ICD-10-CM | POA: Diagnosis not present

## 2020-11-08 DIAGNOSIS — M62838 Other muscle spasm: Secondary | ICD-10-CM | POA: Diagnosis not present

## 2020-11-08 DIAGNOSIS — M533 Sacrococcygeal disorders, not elsewhere classified: Secondary | ICD-10-CM | POA: Diagnosis not present

## 2020-11-08 DIAGNOSIS — M545 Low back pain, unspecified: Secondary | ICD-10-CM | POA: Diagnosis not present

## 2020-11-08 DIAGNOSIS — M5137 Other intervertebral disc degeneration, lumbosacral region: Secondary | ICD-10-CM | POA: Diagnosis not present

## 2020-11-08 LAB — CBC WITH DIFFERENTIAL/PLATELET
Abs Immature Granulocytes: 0.14 10*3/uL — ABNORMAL HIGH (ref 0.00–0.07)
Basophils Absolute: 0 10*3/uL (ref 0.0–0.1)
Basophils Relative: 0 %
Eosinophils Absolute: 0.5 10*3/uL (ref 0.0–0.5)
Eosinophils Relative: 4 %
HCT: 30.7 % — ABNORMAL LOW (ref 39.0–52.0)
Hemoglobin: 10.5 g/dL — ABNORMAL LOW (ref 13.0–17.0)
Immature Granulocytes: 1 %
Lymphocytes Relative: 11 %
Lymphs Abs: 1.2 10*3/uL (ref 0.7–4.0)
MCH: 31 pg (ref 26.0–34.0)
MCHC: 34.2 g/dL (ref 30.0–36.0)
MCV: 90.6 fL (ref 80.0–100.0)
Monocytes Absolute: 1.4 10*3/uL — ABNORMAL HIGH (ref 0.1–1.0)
Monocytes Relative: 13 %
Neutro Abs: 7.6 10*3/uL (ref 1.7–7.7)
Neutrophils Relative %: 71 %
Platelets: 208 10*3/uL (ref 150–400)
RBC: 3.39 MIL/uL — ABNORMAL LOW (ref 4.22–5.81)
RDW: 12.6 % (ref 11.5–15.5)
WBC: 10.8 10*3/uL — ABNORMAL HIGH (ref 4.0–10.5)
nRBC: 0 % (ref 0.0–0.2)

## 2020-11-08 LAB — RESP PANEL BY RT-PCR (FLU A&B, COVID) ARPGX2
Influenza A by PCR: NEGATIVE
Influenza B by PCR: NEGATIVE
SARS Coronavirus 2 by RT PCR: NEGATIVE

## 2020-11-08 MED ORDER — CIPROFLOXACIN HCL 500 MG PO TABS: 500.0000 mg | ORAL_TABLET | Freq: Two times a day (BID) | ORAL | 0 refills | Status: AC

## 2020-11-08 MED ORDER — METRONIDAZOLE 500 MG PO TABS: 500.0000 mg | ORAL_TABLET | Freq: Three times a day (TID) | ORAL | 0 refills | Status: AC

## 2020-11-08 NOTE — Telephone Encounter (Signed)
I called Kevin Terrell based on a secure chat from [place]; that she wanted to know if pt should get PET scan.  Dr Janese Banks says pt should just come in cancer center on 3/15.  Dr. Janese Banks says that with all the pancreatitis it is not good to do pet scan that soon. He is suppose to get out of hospital today.I told her if any questions can call back with direct number given

## 2020-11-08 NOTE — TOC Transition Note (Signed)
Transition of Care [facility]) - CM/SW Discharge Note   Patient Details  Name: Kevin Terrell MRN: [id number] Date of Birth: Oct 08, 1940  Transition of Care [facility] CM/SW Contact:  Shelbie Hutching, RN Phone Number: 11/08/2020, 2:45 PM   Clinical Narrative:    Patient is medically cleared for discharge to [facility] today.  Patient will be going to room [facility] at [facility].  Family will transport patient, daughters in the room.  Bedside RN will call report to [phone].   Final next level of care: Skilled Nursing Facility Barriers to Discharge: Barriers Resolved   Patient Goals and CMS Choice Patient states their goals for this hospitalization and ongoing recovery are:: to get well CMS Medicare.gov Compare Post Acute Care list provided to:: Patient Choice offered to / list presented to : Patient  Discharge Placement PASRR number recieved: 11/08/20            Patient [facility] bed [facility] Patient to be transferred to facility by: Family Name of family member notified: daughters both in the room Patient and family notified of of transfer: 11/08/20  Discharge Plan and Services   Discharge Planning Services: CM Consult            DME Arranged: N/A DME Agency: NA                  Social Determinants of Health ([place]) Interventions     Readmission Risk Interventions No flowsheet data found.

## 2020-11-08 NOTE — Progress Notes (Signed)
Called report to [facility], updated [place] on patient status and instructions. Removed IV to left AC will leave via wheelchair with daughter all belongings gathered

## 2020-11-08 NOTE — Progress Notes (Signed)
   Date of Admission:  10/31/2020      Subjective: Doing much better No pain abdomen No diarrhea No fever  Medications:  . enoxaparin (LOVENOX) injection  40 mg Subcutaneous Q24H  . fluticasone  1 spray Each Nare BID  . gabapentin  300 mg Oral BID  . levothyroxine  50 mcg Oral Q0600  . loratadine  10 mg Oral Daily  . meloxicam  15 mg Oral Daily  . montelukast  10 mg Oral QHS  . ondansetron (ZOFRAN) IV  4 mg Intravenous Q6H  . pantoprazole (PROTONIX) IV  40 mg Intravenous Q24H  . senna-docusate  2 tablet Oral BID    Objective: Vital signs in last 24 hours: Temp:  [97.7 F (36.5 C)-98.6 F (37 C)] 98.5 F (36.9 C) (03/10 1243) Pulse Rate:  [70-90] 78 (03/10 1243) Resp:  [14-18] 14 (03/10 1243) BP: (103-141)/(59-84) 103/59 (03/10 1243) SpO2:  [96 %-100 %] 100 % (03/10 1243)  PHYSICAL EXAM:  General: Alert, cooperative, no distress, appears stated age.  Lungs: Clear to auscultation bilaterally. No Wheezing or Rhonchi. No rales. Heart: Regular rate and rhythm, no murmur, rub or gallop. Abdomen: Soft, non-tender,not distended. Bowel sounds normal. No masses Extremities: atraumatic, no cyanosis. No edema. No clubbing Skin: No rashes or lesions. Or bruising Lymph: Cervical, supraclavicular normal. Neurologic: Grossly non-focal  Lab Results Recent Labs    11/06/20 0827 11/07/20 0632 11/08/20 0637  WBC 14.3* 13.2* 10.8*  HGB 11.7* 10.6* 10.5*  HCT 33.6* 31.9* 30.7*  NA 138 137  --   K 3.3* 4.6  --   CL 103 104  --   CO2 27 27  --   BUN 17 11  --   CREATININE 0.72 0.70  --    Liver Panel Recent Labs    11/06/20 0827  PROT 5.9*  ALBUMIN 2.9*  AST 34  ALT 26  ALKPHOS 73  BILITOT 0.6  BILIDIR <0.1  IBILI NOT CALCULATED    Microbiology: Peritoneal fluid- NG 10/31/20 BC -NG   Assessment/Plan: Acute pancreatitis due to gallstones Status post lap cholecystectomy.  Pathology shows chronic cholecystitis and cholelithiasis  Purulent peritonitis.  As  evidenced by high white count in the peritoneal fluid.  Cultures so far is negative.  This was sent when he was not on any antibiotics  Currently on ceftriaxone and IV Flagyl day 3.  Will discharge him on CIPRO + flagyl for 7 days Anemia  Hypokalemia and hypomagnesemia have resolved  History of CA lung status post lobectomy  Discussed the management with the patient and his family and hospitalist  Follow up appt in 1 week with me -11/15/20 Discussed the management with the patient and his daughters

## 2020-11-08 NOTE — Progress Notes (Deleted)
Pt seen in the BR trying to have a BM but was unsuccessful. Assisted back to bed. Fall precaution reinforced.

## 2020-11-08 NOTE — Discharge Summary (Signed)
Physician Discharge Summary  Patient ID: Kevin Terrell MRN: [id number] DOB/AGE: [birth year] 79 y.o.  Admit date: 10/31/2020 Discharge date: 11/08/2020  Admission Diagnoses:  Discharge Diagnoses:  Principal Problem:   Syncope Active Problems:   Hypothyroidism   Gastroesophageal reflux disease without esophagitis   Abdominal pain   Dilation of pancreatic duct   Dehydration   Acute pancreatitis   Peritonitis [facility])   Discharged Condition: good  Hospital Course:  The patient is a 80 yr old man who presented to [facility] ED on 10/31/2020 with complaints of syncopal episode.He also had a 1 day of acute abdominal pain. MRCP showed acute pancreatitis with dilated pancreatic duct and CBD. Patient has been seen by GI, suspect patient hadpassed a gallstone. To prevent recurrence of pancreatitis, cholecystectomy was performed 3/6. CT and MRI also showed significant lymphadenopathy within the abdomen. Patient has been seen by oncology, will follow up as outpatient. However, during the cholecystectomy, patient will drain out about 500 mL of purulent ascites. Cultureso far negative for any pathogen x48 hours.   #1.  Peritonitis. During cholecystectomy, patient was found to have a significant amount of purulent ascites.  Study showed 13,000 WBC in ascites, liver ultrasound did not show any evidence of portal vein thrombosis. After discussion with ID, decision was made to treat her with antibiotics, started Flagyl and cefepime. So far the culture from ascites has been negative after 48 hours, it remains possible this is not a bacterial infection.  However, we decided to treat it with 7 days of additional antibiotics with Cipro and Flagyl  2.  Acute gallstone pancreatitis Cholelithiasis. Condition had improved, status post cholecystectomy.  3.  Syncope with dehydration. Hyponatremia. Improved.   Consults: ID  GI, GS  Significant Diagnostic Studies:  DUPLEX ULTRASOUND OF  LIVER  TECHNIQUE: Color and duplex Doppler ultrasound was performed to evaluate the hepatic in-flow and out-flow vessels.  COMPARISON: CT abdomen pelvis with contrast 11/02/2020  MRI abdomen 11/01/2020  FINDINGS: Liver: Normal parenchymal echogenicity. Normal hepatic contour without nodularity.  2.4 x 2.3 x 2.2 cm echogenic lesion seen in the dome of the right hepatic lobe consistent with hemangioma seen on recent MRI. This lesion is better assessed on the recent MRI.  Main Portal Vein size: 0.8 cm  Portal Vein Velocities  Main Prox: 49 cm/sec  Main Mid: 50 cm/sec  Main Dist: 52 cm/sec Right: 27 cm/sec Left: 40 cm/sec  Hepatic Vein Velocities  Right: 32 cm/sec  Middle: 27 cm/sec  Left: 32 cm/sec  IVC: Present and patent with normal respiratory phasicity.  Hepatic Artery Velocity: 48 cm/sec  Splenic Vein Velocity: 27 cm/sec  Spleen: 6.6 cm x 3.4 cm x 9.8 cm with a total volume of 115 cm^3 (411 cm^3 is upper limit normal)  Portal Vein Occlusion/Thrombus: No  Splenic Vein Occlusion/Thrombus: No  Ascites: None  Varices: None  Small bilateral pleural effusions partially visualized.  IMPRESSION: Echogenic lesion in the dome of the right hepatic lobe measuring 2.4 x 2.2 x 2.3 cm is consistent with a hemangioma given appearance on recent MRI.   Electronically Signed By: Miachel Roux M.D. On: 11/05/2020 12:22  CT ABDOMEN AND PELVIS WITH CONTRAST  TECHNIQUE: Multidetector CT imaging of the abdomen and pelvis was performed using the standard protocol following bolus administration of intravenous contrast.  CONTRAST: <See Chart>OMNIPAQUE IOHEXOL 240 MG/ML SOLN, 166mL OMNIPAQUE IOHEXOL 300 MG/ML SOLN  COMPARISON: MRI abdomen 11/01/2020, CT abdomen pelvis 10/31/2020  FINDINGS: Lower chest: Interval development of bilateral trace volume pleural effusions with associated  passive atelectasis bilateral lower  lobes. Coronary artery calcifications.  Hepatobiliary: Redemonstration of a stable 2.1 cm hypodensity within the right hepatic lobe (2:11) that is consistent with a known hepatic hemangioma better evaluated on MRI abdomen 11/01/2020. Subcentimeter hypodensities are again noted and too small to characterize. No focal liver abnormality. Layering high density within the gallbladder lumen likely represent vicarious excretion of previously administered intravenous contrast. No gallstones, gallbladder wall thickening, or pericholecystic fluid. No biliary dilatation.  Pancreas: Hazy pancreatic contour with interval increase in peripancreatic fat stranding and free fluid. No pseudocyst formation. Focal borderline dilatation of the main pancreatic duct in the proximal pancreas is poorly visualized but still present. Finding better evaluated on MRI abdomen 11/01/2020. No diffuse main pancreatic duct dilatation.  Spleen: Normal in size without focal abnormality.  Adrenals/Urinary Tract: No adrenal nodule bilaterally. Bilateral kidneys enhance symmetrically. No hydronephrosis. No hydroureter. The urinary bladder is unremarkable. On delayed imaging, there is no urothelial wall thickening and there are no filling defects in the opacified portions of the bilateral collecting systems or ureters.  Stomach/Bowel: Stomach is within normal limits. No evidence of bowel wall thickening or dilatation. Appendix appears normal.  Vascular/Lymphatic: The portal, splenic, superior mesenteric veins are patent. Limited evaluation of the splenic artery on this portal venous single phase contrast study. No abdominal aorta or iliac aneurysm. Severe atherosclerotic plaque of the aorta and its branches. Redemonstration of ill-defined hazy density throughout the retroperitoneum along the aorta andinferior vena cava with extension anteriorly to the mesenteric vessels suggestive of possibly lymphoproliferative  disorder.  Reproductive: The prostate is enlarged measuring up to 5.1 cm.  Other: Interval increase in moderate volume simple fluid ascites. No intraperitoneal free gas. No organized fluid collection.  Musculoskeletal:  No abdominal wall hernia or abnormality.  No suspicious lytic or blastic osseous lesions. No acute displaced fracture. Multilevel degenerative changes of the spine.  IMPRESSION: 1. Acute pancreatitis with interval increase in moderate volume simple free fluid ascites. No pseudocyst formation. 2. 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 Recommend PET-CT for further evaluation. 3. Sigmoid diverticulosis with no acute diverticulitis. 4. Aortic Atherosclerosis (ICD10-I70.0).   Electronically Signed By: Iven Finn M.D. On: 11/02/2020 16:49  MRI ABDOMEN WITHOUT AND WITH CONTRAST (INCLUDING MRCP)  TECHNIQUE: Multiplanar multisequence MR imaging of the abdomen was performed both before and after the administration of intravenous contrast. Heavily T2-weighted images of the biliary and pancreatic ducts were obtained, and three-dimensional MRCP images were rendered by post processing.  CONTRAST: 54mL GADAVIST GADOBUTROL 1 MMOL/ML IV SOLN  COMPARISON: CT scan 10/31/2020.  FINDINGS: Lower chest: Unremarkable.  Hepatobiliary: 2.3 cm lesion noted posterior right liver showing some peripheral nodular enhancement after IV contrast administration. No substantial filling in on more delayed imaging. This lesion was present on a chest CT from 11/28/2019 and is stable in size in the interval. 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 1.4 cm simple cyst noted in the dome of the left liver several other scattered tiny T2 hyperintensities  are too small to characterize. There is no evidence for gallstones, gallbladder wall thickening, or pericholecystic fluid.  Mild intrahepatic biliary duct dilatation noted with focal narrowing of the common duct in the hepatoduodenal ligament (see coronal thin MRCP image 12 of series 14 and postcontrast coronal image 43 of series 23. This finding is also demonstrated on 3D MRCP image 3 of series 10/21 and  image 1 of series 1032. No discernible obstructing stone or mass lesion evident.  Heterogeneous perfusion of liver parenchyma evident on arterial phase imaging.  Pancreas: Diffuse edema in and around the pancreas appears progressive since CT scan 1 day earlier. Pancreatic duct is less dilated measuring 3 mm diameter in the body and 4 mm diameter in the head of pancreas. No discrete pancreatic mass lesion evident.  Spleen: No splenomegaly. No focal mass lesion.  Adrenals/Urinary Tract: No adrenal nodule or mass. No gross suspicious abnormality in either kidney.  Stomach/Bowel: Stomach is markedly distended with food and fluid. Duodenum is nondistended with duodenal diverticulum evident. No small bowel or colonic dilatation within the visualized abdomen.  Vascular/Lymphatic: No abdominal aortic aneurysm no substantial wall thickening or mural enhancement in the aorta. Variant anatomy with common trunk for the celiac axis and SMA noted.  Other: As seen on previous CT, there is diffuse retroperitoneal edema, progressive in the interval with fluid now visible in the para colic gutters bilaterally  Musculoskeletal: No focal suspicious marrow enhancement within the visualized bony anatomy.  IMPRESSION: 1. Mild intrahepatic biliary duct dilatation with focal narrowing of the common duct in the hepatoduodenal ligament. No discernible obstructing stone or mass lesion evident. ERCP may be warranted to further evaluate. 2. No gallstones or evidence of  choledocholithiasis. 3. Diffuse edema in and around the pancreas appears progressive since CT scan 1 day earlier. Pancreatic duct is less dilated. No discrete mass lesion evident. Imaging appearance suggests pancreatitis. 4. Interval progression of retroperitoneal fluid with fluid now visible in the para colic gutters. As on CT yesterday, there is retroperitoneal edema around the aorta but no substantial aortic wall thickening to suggest aortitis/vasculitis. 5. 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 Smaller cyst noted left hepatic lobe with additional tiny T2 hyperintense liver lesions too small to characterize. 6. Marked distention of the stomach with food and fluid. No duodenal dilatation. No obstructing mass lesion, but component of gastric outlet obstruction not excluded.   Electronically Signed By: Misty Stanley M.D. On: 11/01/2020 07:54    Treatments: IV fluids, antibiotics, cholecystectomy  Discharge Exam: Blood pressure (!) 103/59, pulse 78, temperature 98.5 F (36.9 C), temperature source Oral, resp. rate 14, height 6\' 1"  (1.854 m), weight 93.9 kg, SpO2 100 %. General appearance: alert and cooperative Resp: clear to auscultation bilaterally Cardio: regular rate and rhythm, S1, S2 normal, no murmur, click, rub or gallop GI: soft, non-tender; bowel sounds normal; no masses,  no organomegaly Extremities: extremities normal, atraumatic, no cyanosis or edema  Disposition: Discharge disposition: 03-Skilled Nursing Facility       Discharge Instructions    Diet - low sodium heart healthy   Complete by: As directed    Diet - low sodium heart healthy   Complete by: As directed    Discharge wound care:   Complete by: As directed    Follow by RN and keep clean   Discharge wound care:   Complete by: As directed    RN to follow, keep clean   Increase activity slowly   Complete by: As directed    Increase activity slowly    Complete by: As directed      Allergies as of 11/08/2020      Reactions   Compazine [prochlorperazine Edisylate]    muscle spasmus   Other    [place]ra - hives      Medication List    TAKE these medications   cetirizine 10 MG tablet Commonly  known as: ZYRTEC Take 10 mg by mouth daily.   cholecalciferol 25 MCG (1000 UNIT) tablet Commonly known as: VITAMIN D3 Take 5,000 Units by mouth daily.   ciprofloxacin 500 MG tablet Commonly known as: Cipro Take 1 tablet (500 mg total) by mouth 2 (two) times daily for 7 days.   famotidine 20 MG tablet Commonly known as: PEPCID Take 1 tablet (20 mg total) by mouth at bedtime.   fluticasone 50 MCG/ACT nasal spray Commonly known as: FLONASE Place 1 spray into both nostrils 2 (two) times daily.   gabapentin 300 MG capsule Commonly known as: NEURONTIN TAKE 1 CAPSULE BY MOUTH TWICE DAILY   ketoconazole 2 % cream Commonly known as: NIZORAL Apply  a small amount to affected area twice a day   levothyroxine 50 MCG tablet Commonly known as: SYNTHROID TAKE 1 TABLET BY MOUTH ONCE A DAY BEFOREBREAKFAST.   meloxicam 15 MG tablet Commonly known as: MOBIC TAKE ONE TABLET BY MOUTH DAILY   metroNIDAZOLE 500 MG tablet Commonly known as: Flagyl Take 1 tablet (500 mg total) by mouth 3 (three) times daily for 7 days.   montelukast 10 MG tablet Commonly known as: SINGULAIR TAKE 1 TABLET BY MOUTH EVERY NIGHT AT BEDTIME   multivitamin with minerals Tabs tablet Take 1 tablet by mouth daily.   nystatin-triamcinolone ointment Commonly known as: MYCOLOG Apply 1 application topically 2 (two) times daily.   Olopatadine HCl 0.2 % Soln as needed.   pantoprazole 40 MG tablet Commonly known as: Protonix Take 1 tablet (40 mg total) by mouth daily.   PROBIOTIC PO Take 1 capsule by mouth 2 (two) times daily.   rosuvastatin 10 MG tablet Commonly known as: Crestor Take 1 tablet (10 mg total) by mouth daily.   senna-docusate 8.6-50 MG  tablet Commonly known as: Senokot-S Take 2 tablets by mouth 2 (two) times daily.   tacrolimus 0.1 % ointment Commonly known as: PROTOPIC Apply topically 2 (two) times daily.   tacrolimus 0.1 % ointment Commonly known as: PROTOPIC Apply topically in the morning and at bedtime.   triamcinolone ointment 0.1 % Commonly known as: KENALOG APPLY TO HANDS AT BEDTIME AND COVER WITHGLOVES AS NEEDED FOR FLARES. AVOID FACE/GROIN/AXILLA.            Discharge Care Instructions  (From admission, onward)         Start     Ordered   11/08/20 0000  Discharge wound care:       Comments: RN to follow, keep clean   11/08/20 1413   11/06/20 0000  Discharge wound care:       Comments: Follow by RN and keep clean   11/06/20 1017          Contact information for follow-up providers    Viviana Simpler I, MD Follow up in 1 week(s).   Specialties: Internal Medicine, Pediatrics Contact information: [place] [address] [phone]        Nelva Bush, MD. Go on 11/16/2020.   Specialty: Cardiology Why: @ 8am Contact information: [place] [address] [phone]        Benjamine Sprague, DO. Go on 11/20/2020.   Specialty: Surgery Why: @ 9:30an Contact information: [address] [place] [address] [phone]        Lloyd Huger, MD. Go on 11/13/2020.   Specialty: Oncology Why: @ 11:15 am Contact information: [place] [address] [phone]  Tsosie Billing, MD. Go on 11/08/2020.   Specialty: Infectious Diseases Why: @ 11am Contact information: [address] [place] [address] [phone]            Contact information for after-discharge care    Destination    [facility] .   Service: Skilled Nursing Contact information: [place] [phone]                   Signed: Sharen Hones 11/08/2020, 2:13 PM

## 2020-11-09 ENCOUNTER — Other Ambulatory Visit: Payer: Medicare Other

## 2020-11-09 DIAGNOSIS — K859 Acute pancreatitis without necrosis or infection, unspecified: Secondary | ICD-10-CM | POA: Diagnosis not present

## 2020-11-10 LAB — AEROBIC/ANAEROBIC CULTURE W GRAM STAIN (SURGICAL/DEEP WOUND): Culture: NO GROWTH

## 2020-11-13 ENCOUNTER — Inpatient Hospital Stay: Payer: Medicare Other | Admitting: Oncology

## 2020-11-14 DIAGNOSIS — E039 Hypothyroidism, unspecified: Secondary | ICD-10-CM | POA: Diagnosis not present

## 2020-11-14 DIAGNOSIS — K851 Biliary acute pancreatitis without necrosis or infection: Secondary | ICD-10-CM | POA: Diagnosis not present

## 2020-11-14 DIAGNOSIS — K219 Gastro-esophageal reflux disease without esophagitis: Secondary | ICD-10-CM | POA: Diagnosis not present

## 2020-11-14 DIAGNOSIS — K659 Peritonitis, unspecified: Secondary | ICD-10-CM | POA: Diagnosis not present

## 2020-11-15 ENCOUNTER — Ambulatory Visit: Payer: Medicare Other | Attending: Infectious Diseases | Admitting: Infectious Diseases

## 2020-11-15 ENCOUNTER — Encounter: Payer: Self-pay | Admitting: Infectious Diseases

## 2020-11-15 ENCOUNTER — Other Ambulatory Visit
Admission: RE | Admit: 2020-11-15 | Discharge: 2020-11-15 | Disposition: A | Discharge status: Home / Self Care | Payer: Medicare Other | Source: Ambulatory Visit | Attending: Infectious Diseases | Admitting: Infectious Diseases

## 2020-11-15 ENCOUNTER — Other Ambulatory Visit: Payer: Self-pay

## 2020-11-15 VITALS — BP 95/64 | HR 81 | Temp 98.7°F | Resp 16 | Ht 73.0 in | Wt 191.0 lb

## 2020-11-15 DIAGNOSIS — K659 Peritonitis, unspecified: Secondary | ICD-10-CM | POA: Insufficient documentation

## 2020-11-15 DIAGNOSIS — K859 Acute pancreatitis without necrosis or infection, unspecified: Secondary | ICD-10-CM | POA: Insufficient documentation

## 2020-11-15 DIAGNOSIS — Z888 Allergy status to other drugs, medicaments and biological substances status: Secondary | ICD-10-CM | POA: Insufficient documentation

## 2020-11-15 DIAGNOSIS — K650 Generalized (acute) peritonitis: Secondary | ICD-10-CM | POA: Insufficient documentation

## 2020-11-15 DIAGNOSIS — Z791 Long term (current) use of non-steroidal anti-inflammatories (NSAID): Secondary | ICD-10-CM | POA: Insufficient documentation

## 2020-11-15 DIAGNOSIS — Z79899 Other long term (current) drug therapy: Secondary | ICD-10-CM | POA: Insufficient documentation

## 2020-11-15 DIAGNOSIS — Z09 Encounter for follow-up examination after completed treatment for conditions other than malignant neoplasm: Secondary | ICD-10-CM | POA: Diagnosis present

## 2020-11-15 DIAGNOSIS — Z9049 Acquired absence of other specified parts of digestive tract: Secondary | ICD-10-CM | POA: Insufficient documentation

## 2020-11-15 DIAGNOSIS — Z85118 Personal history of other malignant neoplasm of bronchus and lung: Secondary | ICD-10-CM | POA: Insufficient documentation

## 2020-11-15 LAB — COMPREHENSIVE METABOLIC PANEL
ALT: 30 U/L (ref 0–44)
AST: 25 U/L (ref 15–41)
Albumin: 3.3 g/dL — ABNORMAL LOW (ref 3.5–5.0)
Alkaline Phosphatase: 56 U/L (ref 38–126)
Anion gap: 7 (ref 5–15)
BUN: 18 mg/dL (ref 8–23)
CO2: 28 mmol/L (ref 22–32)
Calcium: 8.4 mg/dL — ABNORMAL LOW (ref 8.9–10.3)
Chloride: 104 mmol/L (ref 98–111)
Creatinine, Ser: 0.66 mg/dL (ref 0.61–1.24)
GFR, Estimated: >60 mL/min (ref >60)
Glucose, Bld: 88 mg/dL (ref 70–99)
Potassium: 3.6 mmol/L (ref 3.5–5.1)
Sodium: 139 mmol/L (ref 135–145)
Total Bilirubin: 0.4 mg/dL (ref 0.3–1.2)
Total Protein: 6.4 g/dL — ABNORMAL LOW (ref 6.5–8.1)

## 2020-11-15 LAB — CBC WITH DIFFERENTIAL/PLATELET
Abs Immature Granulocytes: 0.09 10*3/uL — ABNORMAL HIGH (ref 0.00–0.07)
Basophils Absolute: 0 10*3/uL (ref 0.0–0.1)
Basophils Relative: 1 %
Eosinophils Absolute: 0.1 10*3/uL (ref 0.0–0.5)
Eosinophils Relative: 2 %
HCT: 33.4 % — ABNORMAL LOW (ref 39.0–52.0)
Hemoglobin: 10.5 g/dL — ABNORMAL LOW (ref 13.0–17.0)
Immature Granulocytes: 2 %
Lymphocytes Relative: 22 %
Lymphs Abs: 1.4 10*3/uL (ref 0.7–4.0)
MCH: 30.1 pg (ref 26.0–34.0)
MCHC: 31.4 g/dL (ref 30.0–36.0)
MCV: 95.7 fL (ref 80.0–100.0)
Monocytes Absolute: 1 10*3/uL (ref 0.1–1.0)
Monocytes Relative: 17 %
Neutro Abs: 3.5 10*3/uL (ref 1.7–7.7)
Neutrophils Relative %: 56 %
Platelets: 386 10*3/uL (ref 150–400)
RBC: 3.49 MIL/uL — ABNORMAL LOW (ref 4.22–5.81)
RDW: 13.1 % (ref 11.5–15.5)
WBC: 6.2 10*3/uL (ref 4.0–10.5)
nRBC: 0 % (ref 0.0–0.2)

## 2020-11-15 NOTE — Patient Instructions (Signed)
You are here for follow up of recent cholecystectomy, pancreatitis, peritonitis. You are completing cipro and flagyl today- will do labs today.

## 2020-11-15 NOTE — Progress Notes (Signed)
NAME: Kevin Terrell  DOB: [birth year]  MRN: [id number]  Date/Time: 11/15/2020 9:37 AM   Subjective:   ?Patient is here after hospital discharge.  He is here with his daughter. Kevin Terrell is a 80 y.o. male with h/o lung cancer s/p lobectomy and chemo is here for follow up agter recent hospitalization at [facility] between 10/31/20-11/08/20 for acute abdominal pain and near syncope while doing [place].  MRCP showed acute pancreatitis with dilated pancreatic duct and CBD.  He was seen by GI and was suspected to have gallstone induced pancreatitis.  To prevent recurrence of pancreatitis he underwent robotic assisted laparoscopic cholecystectomy  on 11/04/2020.  During the procedure it was noted that he had some purulent looking ascites fluid throughout the entire abdomen.  Cultures were sent.  Both blood and acetic fluid culture were negative.  Patient was started on IV ceftriaxone and Flagyl on 11/05/2020 and on discharge it was switched to Cipro and Flagyl for 7 more days. Today's his last day of antibiotic.  He is doing much better.  He does not have abdominal pain except at one site of the robotic port where the pain is 2 out of 10.  Metallic taste due to the metronidazole.  Does not like the taste of water.  Has some loose stools..  He does not have any nausea or vomiting.  No fever.  He is at the rehab at [facility].  Past Medical History:  Diagnosis Date  . Actinic keratosis   . Arthritis   . 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 GERD (gastroesophageal reflux disease)    with atypical cough  . Hypothyroidism   . Lung cancer ([place]) 2009   chemo, NO radiation. treated in [place]  . Seborrheic dermatitis     Past Surgical History:  Procedure Laterality Date  . COLONOSCOPY    . COLONOSCOPY WITH PROPOFOL N/A 11/30/2019   Procedure: COLONOSCOPY WITH PROPOFOL;  Surgeon: Lin Landsman, MD;  Location: [facility] ENDOSCOPY;  Service: Gastroenterology;  Laterality: N/A;  . EYE SURGERY      vitreous detachment---laser  . KNEE CARTILAGE SURGERY Right 1968  . KNEE SURGERY     bilateral meniscal surgeries  . LUNG LOBECTOMY     upper right lung 12/15/2007   . 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 UPPER GI ENDOSCOPY      Social History   Socioeconomic History  . Marital status: Married    Spouse name: Not on file  . Number of children: 3  . Years of education: Not on file  . Highest education level: Not on file  Occupational History  . Occupation: Animal nutritionist    Comment: retired  Tobacco Use  . Smoking status: Never Smoker  . Smokeless tobacco: Never Used  Vaping Use  . Vaping Use: Never used  Substance and Sexual Activity  . Alcohol use: Yes    Comment: Once glass of wine per month, none last 24hrs  . Drug use: Never  . Sexual activity: Yes  Other Topics Concern  . Not on file  Social History Narrative   Married   Animal nutritionist   Wife is nurse    Has a daughter in [place]; son and daughter in [place]    6 grandchildren   [place] from [place]; spent some time in [place]; moved here 2019.    Lives at [facility]      Has advanced directives   Daughter [name] is health care POA   Would accept resuscitation  attempts   No feeding tube if cognitively unaware   Social Determinants of Health   Financial Resource Strain: Not on file  Food Insecurity: Not on file  Transportation Needs: Not on file  Physical Activity: Not on file  Stress: Not on file  Social Connections: Not on file  Intimate Partner Violence: Not on file    Family History  Problem Relation Age of Onset  . Cancer Mother   . Alzheimer's disease Mother   . Stroke Maternal Grandmother   . Hypertension Father   . Alzheimer's disease Father    Allergies  Allergen Reactions  . Compazine [Prochlorperazine Edisylate]     muscle spasmus   . Other     [place] - hives   I? Current Outpatient Medications  Medication Sig Dispense Refill  . cetirizine (ZYRTEC) 10 MG tablet Take 10 mg by mouth  daily.    . cholecalciferol (VITAMIN D3) 25 MCG (1000 UT) tablet Take 5,000 Units by mouth daily.    . ciprofloxacin (CIPRO) 500 MG tablet Take 1 tablet (500 mg total) by mouth 2 (two) times daily for 7 days. 20 tablet 0  . famotidine (PEPCID) 20 MG tablet Take 1 tablet (20 mg total) by mouth at bedtime. 90 tablet 3  . fluticasone (FLONASE) 50 MCG/ACT nasal spray Place 1 spray into both nostrils 2 (two) times daily.    Marland Kitchen gabapentin (NEURONTIN) 300 MG capsule TAKE 1 CAPSULE BY MOUTH TWICE DAILY 180 capsule 0  . levothyroxine (SYNTHROID) 50 MCG tablet TAKE 1 TABLET BY MOUTH ONCE A DAY BEFOREBREAKFAST. 90 tablet 0  . meloxicam (MOBIC) 15 MG tablet TAKE ONE TABLET BY MOUTH DAILY 90 tablet 1  . metroNIDAZOLE (FLAGYL) 500 MG tablet Take 1 tablet (500 mg total) by mouth 3 (three) times daily for 7 days. 42 tablet 0  . montelukast (SINGULAIR) 10 MG tablet TAKE 1 TABLET BY MOUTH EVERY NIGHT AT BEDTIME 90 tablet 3  . Multiple Vitamin (MULTIVITAMIN WITH MINERALS) TABS tablet Take 1 tablet by mouth daily.    . pantoprazole (PROTONIX) 40 MG tablet Take 1 tablet (40 mg total) by mouth daily. 30 tablet 0  . Probiotic Product (PROBIOTIC PO) Take 1 capsule by mouth 2 (two) times daily.     No current facility-administered medications for this visit.     Abtx:  Anti-infectives (From admission, onward)   None      REVIEW OF SYSTEMS:  Const: negative fever, negative chills, patient had gained close to 20 pounds while in the hospital secondary to fluids and now is losing the weight. Eyes: negative diplopia or visual changes, negative eye pain ENT: negative coryza, negative sore throat Resp: negative cough, hemoptysis, dyspnea Cards: negative for chest pain, palpitations, lower extremity edema GU: negative for frequency, dysuria and hematuria GI: Negative for abdominal pain, , bleeding, some loose stools Skin: negative for rash and pruritus Heme: negative for easy bruising and gum/nose bleeding MS:  negative for myalgias, arthralgias, back pain and muscle weakness Neurolo:negative for headaches, dizziness, vertigo, memory problems  Psych: negative for feelings of anxiety, depression  Endocrine: Has hypothyroidism Allergy/Immunology-as above Objective:  VITALS:  BP 95/64   Pulse 81   Temp 98.7 F (37.1 C) (Oral)   Resp 16   Ht 6\' 1"  (1.854 m)   Wt 191 lb (86.6 kg)   SpO2 97%   BMI 25.20 kg/m  PHYSICAL EXAM:  General: Alert, cooperative, no distress, appears stated age.  Head: Normocephalic, without obvious abnormality, atraumatic. Eyes: Conjunctivae clear,  anicteric sclerae. Pupils are equal ENT Nares normal. No drainage or sinus tenderness. Lips, mucosa, and tongue normal. No Thrush Neck: Supple, symmetrical, no adenopathy, thyroid: non tender no carotid bruit and no JVD. Back: No CVA tenderness. Lungs: Clear to auscultation bilaterally. No Wheezing or Rhonchi. No rales. Heart: Regular rate and rhythm, no murmur, rub or gallop. Abdomen: Soft, non-tender,not distended. Bowel sounds normal. No masses.  All 4 sites of robotic laparoscopy are healing with no evidence of discharge or erythema or tenderness. Extremities: atraumatic, no cyanosis. No edema. No clubbing Skin: No rashes or lesions. Or bruising Lymph: Cervical, supraclavicular normal. Neurologic: Grossly non-focal Pertinent Labs Lab Results CBC CBC Latest Ref Rng & Units 11/15/2020 11/08/2020 11/07/2020  WBC 4.0 - 10.5 K/uL 6.2 10.8(H) 13.2(H)  Hemoglobin 13.0 - 17.0 g/dL 10.5(L) 10.5(L) 10.6(L)  Hematocrit 39.0 - 52.0 % 33.4(L) 30.7(L) 31.9(L)  Platelets 150 - 400 K/uL 386 208 191   CMP Latest Ref Rng & Units 11/15/2020 11/07/2020 11/06/2020  Glucose 70 - 99 mg/dL 88 116(H) 87  BUN 8 - 23 mg/dL 18 11 17   Creatinine 0.61 - 1.24 mg/dL 0.66 0.70 0.72  Sodium 135 - 145 mmol/L 139 137 138  Potassium 3.5 - 5.1 mmol/L 3.6 4.6 3.3(L)  Chloride 98 - 111 mmol/L 104 104 103  CO2 22 - 32 mmol/L 28 27 27   Calcium 8.9 - 10.3  mg/dL 8.4(L) 8.1(L) 8.0(L)  Total Protein 6.5 - 8.1 g/dL 6.4(L) - 5.9(L)  Total Bilirubin 0.3 - 1.2 mg/dL 0.4 - 0.6  Alkaline Phos 38 - 126 U/L 56 - 56  AST 15 - 41 U/L 25 - 34  ALT 0 - 44 U/L 30 - 26      Microbiology: Recent Results (from the past 240 hour(s))  Resp Panel by RT-PCR (Flu A&B, Covid) Nasopharyngeal Swab     Status: None   Collection Time: 11/08/20 12:02 PM   Specimen: Nasopharyngeal Swab; Nasopharyngeal(NP) swabs in vial transport medium  Result Value Ref Range Status   SARS Coronavirus 2 by RT PCR NEGATIVE NEGATIVE Final    Comment: (NOTE) SARS-CoV-2 target nucleic acids are NOT DETECTED.  The SARS-CoV-2 RNA is generally detectable in upper respiratory specimens during the acute phase of infection. The lowest concentration of SARS-CoV-2 viral copies this assay can detect is 138 copies/mL. A negative result does not preclude SARS-Cov-2 infection and should not be used as the sole basis for treatment or other patient management decisions. A negative result may occur with  improper specimen collection/handling, submission of specimen other than nasopharyngeal swab, presence of viral mutation(s) within the areas targeted by this assay, and inadequate number of viral copies(<138 copies/mL). A negative result must be combined with clinical observations, patient history, and epidemiological information. The expected result is Negative.  Fact Sheet for Patients:  EntrepreneurPulse.com.au  Fact Sheet for Healthcare Providers:  IncredibleEmployment.be  This test is no t yet approved or cleared by the [place] FDA and  has been authorized for detection and/or diagnosis of SARS-CoV-2 by FDA under an Emergency Use Authorization (EUA). This EUA will remain  in effect (meaning this test can be used) for the duration of the COVID-19 declaration under Section 564(b)(1) of the Act, 21 U.S.C.section 360bbb-3(b)(1), unless the  authorization is terminated  or revoked sooner.       Influenza A by PCR NEGATIVE NEGATIVE Final   Influenza B by PCR NEGATIVE NEGATIVE Final    Comment: (NOTE) The Xpert Xpress SARS-CoV-2/FLU/RSV plus assay is intended as an  aid in the diagnosis of influenza from Nasopharyngeal swab specimens and should not be used as a sole basis for treatment. Nasal washings and aspirates are unacceptable for Xpert Xpress SARS-CoV-2/FLU/RSV testing.  Fact Sheet for Patients: EntrepreneurPulse.com.au  Fact Sheet for Healthcare Providers: IncredibleEmployment.be  This test is not yet approved or cleared by the [place] FDA and has been authorized for detection and/or diagnosis of SARS-CoV-2 by FDA under an Emergency Use Authorization (EUA). This EUA will remain in effect (meaning this test can be used) for the duration of the COVID-19 declaration under Section 564(b)(1) of the Act, 21 U.S.C. section 360bbb-3(b)(1), unless the authorization is terminated or revoked.  Performed at [facility], [address], [place], [place] [address]     ? Impression/Recommendation ?Acute pancreatitis secondary to cholecystitis status post cholecystectomy. Purulent peritonitis.  Likely secondary to inflammatory pancreatitis. Culture of the peritoneal fluid was negative. He has completed for 10 days of antibiotics.  Currently finishing Cipro and Flagyl. Discussed with patient and daughterly   Today labs were repeated- Normal WBC, Cr , LFTS Hb 10.8- will follow up with PCP . ? __Follow PRN _____________________________________________

## 2020-11-16 ENCOUNTER — Ambulatory Visit: Payer: Medicare Other | Admitting: Internal Medicine

## 2020-11-19 DIAGNOSIS — Z48815 Encounter for surgical aftercare following surgery on the digestive system: Secondary | ICD-10-CM | POA: Diagnosis not present

## 2020-11-19 DIAGNOSIS — E7849 Other hyperlipidemia: Secondary | ICD-10-CM | POA: Diagnosis not present

## 2020-11-19 DIAGNOSIS — M19012 Primary osteoarthritis, left shoulder: Secondary | ICD-10-CM | POA: Diagnosis not present

## 2020-11-19 DIAGNOSIS — K659 Peritonitis, unspecified: Secondary | ICD-10-CM | POA: Diagnosis not present

## 2020-11-19 DIAGNOSIS — Z741 Need for assistance with personal care: Secondary | ICD-10-CM | POA: Diagnosis not present

## 2020-11-19 DIAGNOSIS — R278 Other lack of coordination: Secondary | ICD-10-CM | POA: Diagnosis not present

## 2020-11-21 DIAGNOSIS — Z741 Need for assistance with personal care: Secondary | ICD-10-CM | POA: Diagnosis not present

## 2020-11-21 DIAGNOSIS — E7849 Other hyperlipidemia: Secondary | ICD-10-CM | POA: Diagnosis not present

## 2020-11-21 DIAGNOSIS — Z48815 Encounter for surgical aftercare following surgery on the digestive system: Secondary | ICD-10-CM | POA: Diagnosis not present

## 2020-11-21 DIAGNOSIS — M19012 Primary osteoarthritis, left shoulder: Secondary | ICD-10-CM | POA: Diagnosis not present

## 2020-11-21 DIAGNOSIS — R278 Other lack of coordination: Secondary | ICD-10-CM | POA: Diagnosis not present

## 2020-11-21 DIAGNOSIS — K659 Peritonitis, unspecified: Secondary | ICD-10-CM | POA: Diagnosis not present

## 2020-11-26 ENCOUNTER — Encounter: Payer: Medicare Other | Admitting: Internal Medicine

## 2020-11-26 ENCOUNTER — Ambulatory Visit: Payer: Medicare Other | Admitting: Internal Medicine

## 2020-11-28 DIAGNOSIS — R278 Other lack of coordination: Secondary | ICD-10-CM | POA: Diagnosis not present

## 2020-11-28 DIAGNOSIS — E7849 Other hyperlipidemia: Secondary | ICD-10-CM | POA: Diagnosis not present

## 2020-11-28 DIAGNOSIS — M19012 Primary osteoarthritis, left shoulder: Secondary | ICD-10-CM | POA: Diagnosis not present

## 2020-11-28 DIAGNOSIS — Z741 Need for assistance with personal care: Secondary | ICD-10-CM | POA: Diagnosis not present

## 2020-11-28 DIAGNOSIS — Z48815 Encounter for surgical aftercare following surgery on the digestive system: Secondary | ICD-10-CM | POA: Diagnosis not present

## 2020-11-28 DIAGNOSIS — K659 Peritonitis, unspecified: Secondary | ICD-10-CM | POA: Diagnosis not present

## 2020-11-30 ENCOUNTER — Telehealth: Payer: Self-pay | Admitting: Internal Medicine

## 2020-11-30 DIAGNOSIS — R278 Other lack of coordination: Secondary | ICD-10-CM | POA: Diagnosis not present

## 2020-11-30 DIAGNOSIS — Z48815 Encounter for surgical aftercare following surgery on the digestive system: Secondary | ICD-10-CM | POA: Diagnosis not present

## 2020-11-30 DIAGNOSIS — M19012 Primary osteoarthritis, left shoulder: Secondary | ICD-10-CM | POA: Diagnosis not present

## 2020-11-30 DIAGNOSIS — Z741 Need for assistance with personal care: Secondary | ICD-10-CM | POA: Diagnosis not present

## 2020-11-30 DIAGNOSIS — K659 Peritonitis, unspecified: Secondary | ICD-10-CM | POA: Diagnosis not present

## 2020-11-30 DIAGNOSIS — E7849 Other hyperlipidemia: Secondary | ICD-10-CM | POA: Diagnosis not present

## 2020-11-30 NOTE — Telephone Encounter (Signed)
Mr. Deady ci wanted to know if Dr. Silvio Pate would write the prescription for Voltern due to its cheaper as a prescription and he stated that he can write it off as a medical expense.  And its the Voltern 100g   Please advise

## 2020-12-01 NOTE — Telephone Encounter (Signed)
I can certainly write a prescription for voltaren for him---but I have never heard of a 100mg  dose. Does he mean OTC gel? Find out---and okay to send Rx

## 2020-12-03 DIAGNOSIS — Z48815 Encounter for surgical aftercare following surgery on the digestive system: Secondary | ICD-10-CM | POA: Diagnosis not present

## 2020-12-03 DIAGNOSIS — R278 Other lack of coordination: Secondary | ICD-10-CM | POA: Diagnosis not present

## 2020-12-03 DIAGNOSIS — M19012 Primary osteoarthritis, left shoulder: Secondary | ICD-10-CM | POA: Diagnosis not present

## 2020-12-03 DIAGNOSIS — K659 Peritonitis, unspecified: Secondary | ICD-10-CM | POA: Diagnosis not present

## 2020-12-03 DIAGNOSIS — Z741 Need for assistance with personal care: Secondary | ICD-10-CM | POA: Diagnosis not present

## 2020-12-03 DIAGNOSIS — E7849 Other hyperlipidemia: Secondary | ICD-10-CM | POA: Diagnosis not present

## 2020-12-03 MED ORDER — DICLOFENAC SODIUM 1 % EX GEL: CUTANEOUS | 11 refills | Status: DC

## 2020-12-03 NOTE — Telephone Encounter (Signed)
Spoke to pt. He is wanting Voltaren Gel. Generic is just fine. 100gm tubes to [facility]. Uses it on both knees. Sent rx for medication.

## 2020-12-03 NOTE — Telephone Encounter (Signed)
Okay--that 100 makes sense now.  Thanks!

## 2020-12-05 DIAGNOSIS — M19012 Primary osteoarthritis, left shoulder: Secondary | ICD-10-CM | POA: Diagnosis not present

## 2020-12-05 DIAGNOSIS — Z48815 Encounter for surgical aftercare following surgery on the digestive system: Secondary | ICD-10-CM | POA: Diagnosis not present

## 2020-12-05 DIAGNOSIS — E7849 Other hyperlipidemia: Secondary | ICD-10-CM | POA: Diagnosis not present

## 2020-12-05 DIAGNOSIS — Z741 Need for assistance with personal care: Secondary | ICD-10-CM | POA: Diagnosis not present

## 2020-12-05 DIAGNOSIS — K659 Peritonitis, unspecified: Secondary | ICD-10-CM | POA: Diagnosis not present

## 2020-12-05 DIAGNOSIS — R278 Other lack of coordination: Secondary | ICD-10-CM | POA: Diagnosis not present

## 2020-12-06 ENCOUNTER — Encounter: Payer: Medicare Other | Admitting: Dermatology

## 2020-12-06 ENCOUNTER — Other Ambulatory Visit: Payer: Self-pay

## 2020-12-06 ENCOUNTER — Ambulatory Visit (INDEPENDENT_AMBULATORY_CARE_PROVIDER_SITE_OTHER): Payer: Medicare Other | Admitting: Dermatology

## 2020-12-06 DIAGNOSIS — L814 Other melanin hyperpigmentation: Secondary | ICD-10-CM

## 2020-12-06 DIAGNOSIS — Z1283 Encounter for screening for malignant neoplasm of skin: Secondary | ICD-10-CM | POA: Diagnosis not present

## 2020-12-06 DIAGNOSIS — L821 Other seborrheic keratosis: Secondary | ICD-10-CM

## 2020-12-06 DIAGNOSIS — L570 Actinic keratosis: Secondary | ICD-10-CM | POA: Diagnosis not present

## 2020-12-06 DIAGNOSIS — L740 Miliaria rubra: Secondary | ICD-10-CM

## 2020-12-06 DIAGNOSIS — R21 Rash and other nonspecific skin eruption: Secondary | ICD-10-CM

## 2020-12-06 DIAGNOSIS — L308 Other specified dermatitis: Secondary | ICD-10-CM

## 2020-12-06 DIAGNOSIS — L578 Other skin changes due to chronic exposure to nonionizing radiation: Secondary | ICD-10-CM

## 2020-12-06 DIAGNOSIS — D229 Melanocytic nevi, unspecified: Secondary | ICD-10-CM

## 2020-12-06 DIAGNOSIS — L820 Inflamed seborrheic keratosis: Secondary | ICD-10-CM | POA: Diagnosis not present

## 2020-12-06 DIAGNOSIS — D1800 Hemangioma unspecified site: Secondary | ICD-10-CM | POA: Diagnosis not present

## 2020-12-06 MED ORDER — MUPIROCIN 2 % EX OINT: 1.0000 "application " | TOPICAL_OINTMENT | Freq: Three times a day (TID) | CUTANEOUS | 0 refills | Status: DC

## 2020-12-06 NOTE — Patient Instructions (Addendum)
Cryotherapy Aftercare  . Wash gently with soap and water everyday.   Marland Kitchen Apply Vaseline and Band-Aid daily until healed.  Prior to procedure, discussed risks of blister formation, small wound, skin dyspigmentation, or rare scar following cryotherapy.   Recommend daily broad spectrum sunscreen SPF 30+ to sun-exposed areas, reapply every 2 hours as needed. Call for new or changing lesions.  Staying in the shade or wearing long sleeves, sun glasses (UVA+UVB protection) and wide brim hats (4-inch brim around the entire circumference of the hat) are also recommended for sun protection.   Some Recommended Sunscreens Include:  Good for Daily Wear (feels like lotion but NOT sweat resistant) Cerave AM Moisturizer with SPF EltaMD UV Lotion  Body or All Over Sunscreen EltaMD UV active for body and face Blue lizard sensitive Sun bum mineral (avoid if sensitive to scent) Aveeno Positively Mineral Neutrogena sheer zinc (Slightly harder to rub in) CVS clear zinc (Slightly harder to rub in)  Clear Face Sunscreen EltaMD UV Elements CeraVe hydrating sunscreen 50 face  Tinted Face Sunscreen Alastin Hydratint (good for most skin tones, may be slightly dark if you are very fair) Colorescience Sunforgettable Total Protection Face Shield (good for most skin tones) EltaMD UV Physical La Roche Posay Mineral Tinted Cotz Flawless Complexion   Powder Sunscreen (Nice for reapplying or applying on the go) Colorescience Sunforgettable Total Protection Brush on Shield (available in different tints)  Face Sunscreen Available in Different Tints Colorescience Sunforgettable Total Protection Brush on Shield  bareMinerals Complexion Rescue Tinted Hydrating Gel Cream Broad Spectrum SPF 30 UnSun mineral tinted (comes in medium/dark and light/medium)  Kids (over 6 months) - Mineral Sunscreens Recommended eltaMD UV Pure MDsolarSciences KidStick 40 SPF Aveeno Baby Continuous Protection Sensitive Zinc Oxide Blue  Lizard Kids mineral based sunscreen lotion Mustela Mineral Sunscreen for face and body Neutrogena Sheer Zinc Kids Sunscreen Stick  Tinted to look like a tan PCAskin sheer tint body spray  Recommend taking Heliocare sun protection supplement daily in sunny weather for additional sun protection. For maximum protection on the sunniest days, you can take up to 2 capsules of regular Heliocare OR take 1 capsule of Heliocare Ultra. For prolonged exposure (such as a full day in the sun), you can repeat your dose of the supplement 4 hours after your first dose. Heliocare can be purchased at [facility] or at [email or web address].    Melanoma ABCDEs  Melanoma is the most dangerous type of skin cancer, and is the leading cause of death from skin disease.  You are more likely to develop melanoma if you:  Have light-colored skin, light-colored eyes, or red or blond hair  Spend a lot of time in the sun  Tan regularly, either outdoors or in a tanning bed  Have had blistering sunburns, especially during childhood  Have a close family member who has had a melanoma  Have atypical moles or large birthmarks  Early detection of melanoma is key since treatment is typically straightforward and cure rates are extremely high if we catch it early.   The first sign of melanoma is often a change in a mole or a new dark spot.  The ABCDE system is a way of remembering the signs of melanoma.  A for asymmetry:  The two halves do not match. B for border:  The edges of the growth are irregular. C for color:  A mixture of colors are present instead of an even brown color. D for diameter:  Melanomas are usually (but not always)  greater than 53mm - the size of a pencil eraser. E for evolution:  The spot keeps changing in size, shape, and color.  Please check your skin once per month between visits. You can use a small mirror in front and a large mirror behind you to keep an eye on the back side or your body.    If you see any new or changing lesions before your next follow-up, please call to schedule a visit.  Please continue daily skin protection including broad spectrum sunscreen SPF 30+ to sun-exposed areas, reapplying every 2 hours as needed when you're outdoors.   If you have any questions or concerns for your doctor, please call our main line at [phone] and press option 4 to reach your doctor's medical assistant. If no one answers, please leave a voicemail as directed and we will return your call as soon as possible. Messages left after 4 pm will be answered the following business day.   You may also send [place] a message via [place]. We typically respond to MyChart messages within 1-2 business days.  For prescription refills, please ask your pharmacy to contact our office. Our fax number is [phone].  If you have an urgent issue when the clinic is closed that cannot wait until the next business day, you can page your doctor at the number below.    Please note that while we do our best to be available for urgent issues outside of office hours, we are not available 24/7.   If you have an urgent issue and are unable to reach [place], you may choose to seek medical care at your doctor's office, retail clinic, urgent care center, or emergency room.  If you have a medical emergency, please immediately call 911 or go to the emergency department.  Pager Numbers  - Dr. Nehemiah Massed: [phone]  - Dr. Laurence Ferrari: [phone]  - Dr. Nicole Kindred: [phone]  In the event of inclement weather, please call our main line at [phone] for an update on the status of any delays or closures.  Dermatology Medication Tips: Please keep the boxes that topical medications come in in order to help keep track of the instructions about where and how to use these. Pharmacies typically print the medication instructions only on the boxes and not directly on the medication tubes.   If your medication is too expensive,  please contact our office at [phone] option 4 or send [place] a message through [place].   We are unable to tell what your co-pay for medications will be in advance as this is different depending on your insurance coverage. However, we may be able to find a substitute medication at lower cost or fill out paperwork to get insurance to cover a needed medication.   If a prior authorization is required to get your medication covered by your insurance company, please allow [place] 1-2 business days to complete this process.  Drug prices often vary depending on where the prescription is filled and some pharmacies may offer cheaper prices.  The website www.goodrx.com contains coupons for medications through different pharmacies. The prices here do not account for what the cost may be with help from insurance (it may be cheaper with your insurance), but the website can give you the price if you did not use any insurance.  - You can print the associated coupon and take it with your prescription to the pharmacy.  - You may also stop by our office during regular business hours and pick up a GoodRx  coupon card.  - If you need your prescription sent electronically to a different pharmacy, notify our office through [facility] or by phone at [phone] option 4.

## 2020-12-06 NOTE — Progress Notes (Signed)
Follow-Up Visit   Subjective  Kevin Terrell is a 81 y.o. male who presents for the following: FBSE (Patient here for full body skin exam and skin cancer screening. Two spots at forehead that are itchy. Patient with hx of AK's. ).  Patient was in hospital last month and had a mild break out at back he would like checked.   The following portions of the chart were reviewed this encounter and updated as appropriate:   Tobacco  Allergies  Meds  Problems  Med Hx  Surg Hx  Fam Hx      Review of Systems:  No other skin or systemic complaints except as noted in HPI or Assessment and Plan.  Objective  Well appearing patient in no apparent distress; mood and affect are within normal limits.  A full examination was performed including scalp, head, eyes, ears, nose, lips, neck, chest, axillae, abdomen, back, buttocks, bilateral upper extremities, bilateral lower extremities, hands, feet, fingers, toes, fingernails, and toenails. All findings within normal limits unless otherwise noted below.  Objective  right forehead x 1, left forehead x 1 (2): Erythematous keratotic or waxy stuck-on papule or plaque.   Objective  Right Shoulder x 1: Erythematous thin papules/macules with gritty scale.   Objective  Mid Back: Slightly crusted erythematous papules at the back  Objective  Left Shoulder: Scattered crusted erosions   Assessment & Plan  Inflamed seborrheic keratosis (2) right forehead x 1, left forehead x 1  Prior to procedure, discussed risks of blister formation, small wound, skin dyspigmentation, or rare scar following cryotherapy.    Destruction of lesion - right forehead x 1, left forehead x 1  Destruction method: cryotherapy   Informed consent: discussed and consent obtained   Lesion destroyed using liquid nitrogen: Yes   Cryotherapy cycles:  2 Outcome: patient tolerated procedure well with no complications   Post-procedure details: wound care instructions given     AK (actinic keratosis) Right Shoulder x 1  Prior to procedure, discussed risks of blister formation, small wound, skin dyspigmentation, or rare scar following cryotherapy.    Destruction of lesion - Right Shoulder x 1  Destruction method: cryotherapy   Informed consent: discussed and consent obtained   Lesion destroyed using liquid nitrogen: Yes   Cryotherapy cycles:  2 Outcome: patient tolerated procedure well with no complications   Post-procedure details: wound care instructions given    Miliaria rubra Mid Back  Resolving Patient deferred treatment at this time.   Rash Left Shoulder  Start mupirocin TID to open areas  Other Related Procedures Anaerobic and Aerobic Culture  Ordered Medications: mupirocin ointment (BACTROBAN) 2 %   Lentigines - Scattered tan macules - Due to sun exposure - Benign-appering, observe - Recommend daily broad spectrum sunscreen SPF 30+ to sun-exposed areas, reapply every 2 hours as needed. - Call for any changes  Seborrheic Keratoses - Stuck-on, waxy, tan-brown papules and/or plaques  - Benign-appearing - Discussed benign etiology and prognosis. - Observe - Call for any changes  Melanocytic Nevi - Tan-brown and/or pink-flesh-colored symmetric macules and papules - Benign appearing on exam today - Observation - Call clinic for new or changing moles - Recommend daily use of broad spectrum spf 30+ sunscreen to sun-exposed areas.   Hemangiomas - Red papules - Discussed benign nature - Observe - Call for any changes  Actinic Damage - Severe, confluent actinic changes with pre-cancerous actinic keratoses  - Severe, chronic, not at goal, secondary to cumulative UV radiation exposure over time -  diffuse scaly erythematous macules and papules with underlying dyspigmentation - Discussed Prescription "Field Treatment" for Severe, Chronic Confluent Actinic Changes with Pre-Cancerous Actinic Keratoses Field treatment involves  treatment of an entire area of skin that has confluent Actinic Changes (Sun/ Ultraviolet light damage) and PreCancerous Actinic Keratoses by method of PhotoDynamic Therapy (PDT) and/or prescription Topical Chemotherapy agents such as 5-fluorouracil, 5-fluorouracil/calcipotriene, and/or imiquimod.  The purpose is to decrease the number of clinically evident and subclinical PreCancerous lesions to prevent progression to development of skin cancer by chemically destroying early precancer changes that may or may not be visible.  It has been shown to reduce the risk of developing skin cancer in the treated area. As a result of treatment, redness, scaling, crusting, and open sores may occur during treatment course. One or more than one of these methods may be used and may have to be used several times to control, suppress and eliminate the PreCancerous changes. Discussed treatment course, expected reaction, and possible side effects. - Recommend daily broad spectrum sunscreen SPF 30+ to sun-exposed areas, reapply every 2 hours as needed.  - Staying in the shade or wearing long sleeves, sun glasses (UVA+UVB protection) and wide brim hats (4-inch brim around the entire circumference of the hat) are also recommended. - Call for new or changing lesions. 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 Currently well-controlled. - Continue triamcinolone 0.1% ointment twice a day as needed (pt needs only rarely). Avoid applying to face, groin, and axilla. Use as directed. Risk of skin atrophy with long-term use reviewed.  - Pt has deferred tacrolimus in past due to cost and since he needs triamcinolone so rarely    Skin cancer screening performed today.   Return in about 2 months (around 02/05/2021) for AK follow up, PDT to face/frontal hairline.  Kevin Terrell, RMA, am acting as scribe for Forest Gleason, MD .   Documentation: I have  reviewed the above documentation for accuracy and completeness, and I agree with the above.  Forest Gleason, MD

## 2020-12-07 DIAGNOSIS — K659 Peritonitis, unspecified: Secondary | ICD-10-CM | POA: Diagnosis not present

## 2020-12-07 DIAGNOSIS — Z48815 Encounter for surgical aftercare following surgery on the digestive system: Secondary | ICD-10-CM | POA: Diagnosis not present

## 2020-12-07 DIAGNOSIS — E7849 Other hyperlipidemia: Secondary | ICD-10-CM | POA: Diagnosis not present

## 2020-12-07 DIAGNOSIS — Z741 Need for assistance with personal care: Secondary | ICD-10-CM | POA: Diagnosis not present

## 2020-12-07 DIAGNOSIS — M19012 Primary osteoarthritis, left shoulder: Secondary | ICD-10-CM | POA: Diagnosis not present

## 2020-12-07 DIAGNOSIS — R278 Other lack of coordination: Secondary | ICD-10-CM | POA: Diagnosis not present

## 2020-12-10 DIAGNOSIS — K659 Peritonitis, unspecified: Secondary | ICD-10-CM | POA: Diagnosis not present

## 2020-12-10 DIAGNOSIS — Z48815 Encounter for surgical aftercare following surgery on the digestive system: Secondary | ICD-10-CM | POA: Diagnosis not present

## 2020-12-10 DIAGNOSIS — M19012 Primary osteoarthritis, left shoulder: Secondary | ICD-10-CM | POA: Diagnosis not present

## 2020-12-10 DIAGNOSIS — E7849 Other hyperlipidemia: Secondary | ICD-10-CM | POA: Diagnosis not present

## 2020-12-10 DIAGNOSIS — Z741 Need for assistance with personal care: Secondary | ICD-10-CM | POA: Diagnosis not present

## 2020-12-10 DIAGNOSIS — R278 Other lack of coordination: Secondary | ICD-10-CM | POA: Diagnosis not present

## 2020-12-12 ENCOUNTER — Telehealth: Payer: Self-pay

## 2020-12-12 DIAGNOSIS — Z48815 Encounter for surgical aftercare following surgery on the digestive system: Secondary | ICD-10-CM | POA: Diagnosis not present

## 2020-12-12 DIAGNOSIS — R278 Other lack of coordination: Secondary | ICD-10-CM | POA: Diagnosis not present

## 2020-12-12 DIAGNOSIS — Z741 Need for assistance with personal care: Secondary | ICD-10-CM | POA: Diagnosis not present

## 2020-12-12 DIAGNOSIS — K659 Peritonitis, unspecified: Secondary | ICD-10-CM | POA: Diagnosis not present

## 2020-12-12 DIAGNOSIS — M19012 Primary osteoarthritis, left shoulder: Secondary | ICD-10-CM | POA: Diagnosis not present

## 2020-12-12 DIAGNOSIS — E7849 Other hyperlipidemia: Secondary | ICD-10-CM | POA: Diagnosis not present

## 2020-12-12 LAB — ANAEROBIC AND AEROBIC CULTURE

## 2020-12-12 NOTE — Telephone Encounter (Signed)
-----   Message from Alfonso Patten, MD sent at 12/12/2020  2:18 PM EDT ----- Mixed skin flora "normal skin bacteria". No pathogenic bacteria identified.  Please call patient to see how those areas are healing. Would suggest continuing mupirocin until healed unless he is getting new spots or having any worsening, in which case I would want to recheck the area.  MAs please call. Thank you!

## 2020-12-12 NOTE — Telephone Encounter (Signed)
Patient advised culture results showed normal skin bacteria and to continue mupirocin until healed. Patient advises areas have improved and will RTC if worsens, JS

## 2020-12-14 DIAGNOSIS — Z741 Need for assistance with personal care: Secondary | ICD-10-CM | POA: Diagnosis not present

## 2020-12-14 DIAGNOSIS — E7849 Other hyperlipidemia: Secondary | ICD-10-CM | POA: Diagnosis not present

## 2020-12-14 DIAGNOSIS — M19012 Primary osteoarthritis, left shoulder: Secondary | ICD-10-CM | POA: Diagnosis not present

## 2020-12-14 DIAGNOSIS — R278 Other lack of coordination: Secondary | ICD-10-CM | POA: Diagnosis not present

## 2020-12-14 DIAGNOSIS — K659 Peritonitis, unspecified: Secondary | ICD-10-CM | POA: Diagnosis not present

## 2020-12-14 DIAGNOSIS — Z48815 Encounter for surgical aftercare following surgery on the digestive system: Secondary | ICD-10-CM | POA: Diagnosis not present

## 2020-12-17 ENCOUNTER — Encounter: Payer: Self-pay | Admitting: Dermatology

## 2020-12-17 ENCOUNTER — Other Ambulatory Visit: Payer: Medicare Other

## 2020-12-17 DIAGNOSIS — Z741 Need for assistance with personal care: Secondary | ICD-10-CM | POA: Diagnosis not present

## 2020-12-17 DIAGNOSIS — R278 Other lack of coordination: Secondary | ICD-10-CM | POA: Diagnosis not present

## 2020-12-17 DIAGNOSIS — K659 Peritonitis, unspecified: Secondary | ICD-10-CM | POA: Diagnosis not present

## 2020-12-17 DIAGNOSIS — Z48815 Encounter for surgical aftercare following surgery on the digestive system: Secondary | ICD-10-CM | POA: Diagnosis not present

## 2020-12-17 DIAGNOSIS — M19012 Primary osteoarthritis, left shoulder: Secondary | ICD-10-CM | POA: Diagnosis not present

## 2020-12-17 DIAGNOSIS — E7849 Other hyperlipidemia: Secondary | ICD-10-CM | POA: Diagnosis not present

## 2020-12-18 ENCOUNTER — Encounter: Payer: Self-pay | Admitting: Oncology

## 2020-12-18 ENCOUNTER — Inpatient Hospital Stay: Payer: Medicare Other | Attending: Oncology | Admitting: Oncology

## 2020-12-18 VITALS — BP 114/62 | HR 55 | Temp 97.4°F | Resp 20 | Wt 187.6 lb

## 2020-12-18 DIAGNOSIS — Z85118 Personal history of other malignant neoplasm of bronchus and lung: Secondary | ICD-10-CM | POA: Insufficient documentation

## 2020-12-18 DIAGNOSIS — Z79899 Other long term (current) drug therapy: Secondary | ICD-10-CM | POA: Insufficient documentation

## 2020-12-18 DIAGNOSIS — R935 Abnormal findings on diagnostic imaging of other abdominal regions, including retroperitoneum: Secondary | ICD-10-CM

## 2020-12-18 DIAGNOSIS — Z9221 Personal history of antineoplastic chemotherapy: Secondary | ICD-10-CM | POA: Insufficient documentation

## 2020-12-18 DIAGNOSIS — Z902 Acquired absence of lung [part of]: Secondary | ICD-10-CM | POA: Diagnosis not present

## 2020-12-19 DIAGNOSIS — R278 Other lack of coordination: Secondary | ICD-10-CM | POA: Diagnosis not present

## 2020-12-19 DIAGNOSIS — E7849 Other hyperlipidemia: Secondary | ICD-10-CM | POA: Diagnosis not present

## 2020-12-19 DIAGNOSIS — M19012 Primary osteoarthritis, left shoulder: Secondary | ICD-10-CM | POA: Diagnosis not present

## 2020-12-19 DIAGNOSIS — Z48815 Encounter for surgical aftercare following surgery on the digestive system: Secondary | ICD-10-CM | POA: Diagnosis not present

## 2020-12-19 DIAGNOSIS — Z741 Need for assistance with personal care: Secondary | ICD-10-CM | POA: Diagnosis not present

## 2020-12-19 DIAGNOSIS — K659 Peritonitis, unspecified: Secondary | ICD-10-CM | POA: Diagnosis not present

## 2020-12-19 NOTE — Progress Notes (Signed)
Hematology/Oncology Consult note [facility]  [phone] [phone]  Patient Care Team: [name], MD as PCP - General (Internal Medicine) End, Harrell Gave, MD as PCP - Cardiology (Cardiology) Telford Nab, RN as Oncology Nurse Navigator   Name of the patient: Kevin Terrell  [id number]  [birth year]   Date of visit: 12/19/20  Diagnosis-history of lung cancer now referred for abnormal CT abdomen findings  Chief complaint/ Reason for visit-discussed CT abdomen findings and further management  Heme/Onc history: patient is a 80 year old non-smoking male who was 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  He is s/p right upper lobectomy and underwent 4 cycles of adjuvant chemotherapy back then.  This is as per patient history and I do not have any oncology records from back then.  Patient states that he required chemotherapy as he was found to have a high mitotic rate in his final pathology but the size of the final tumor was a little over 2 cm with negative nodes.  Patient has been in remission since then and was getting surveillance CT scans.  Last scan was done in 2019 which did not show any evidence of recurrence.    He did not require follow-up subsequently with oncology and he has been getting yearly low-dose screening CTs   Interval history-patient was admitted for gallstone pancreatitis in March 2022 requiring emergency cholecystectomy.  At that time he was found to have abnormal CT findings which he has come here to discuss.  Overall he feels well today and feels back to his baseline.  Denies any abdominal pain or changes in his bowel habits.  Appetite and weight have remained stable  ECOG PS- 1 Pain scale- 0   Review of systems- Review of Systems  Constitutional: Negative for chills, fever, malaise/fatigue and weight loss.  HENT: Negative for congestion, ear discharge and nosebleeds.   Eyes: Negative for blurred  vision.  Respiratory: Negative for cough, hemoptysis, sputum production, shortness of breath and wheezing.   Cardiovascular: Negative for chest pain, palpitations, orthopnea and claudication.  Gastrointestinal: Negative for abdominal pain, blood in stool, constipation, diarrhea, heartburn, melena, nausea and vomiting.  Genitourinary: Negative for dysuria, flank pain, frequency, hematuria and urgency.  Musculoskeletal: Negative for back pain, joint pain and myalgias.  Skin: Negative for rash.  Neurological: Negative for dizziness, tingling, focal weakness, seizures, weakness and headaches.  Endo/Heme/Allergies: Does not bruise/bleed easily.  Psychiatric/Behavioral: Negative for depression and suicidal ideas. The patient does not have insomnia.       Allergies  Allergen Reactions  . Compazine [Prochlorperazine Edisylate]     muscle spasmus   . Other     Okra - hives     Past Medical History:  Diagnosis Date  . Actinic keratosis   . Arthritis   . 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 GERD (gastroesophageal reflux disease)    with atypical cough  . Hypothyroidism   . Lung cancer ([place]) 2009   chemo, NO radiation. treated in [place]  . Seborrheic dermatitis      Past Surgical History:  Procedure Laterality Date  . COLONOSCOPY    . COLONOSCOPY WITH PROPOFOL N/A 11/30/2019   Procedure: COLONOSCOPY WITH PROPOFOL;  Surgeon: Lin Landsman, MD;  [facility] ENDOSCOPY;  Service: Gastroenterology;  Laterality: N/A;  . EYE SURGERY     vitreous detachment---laser  . KNEE CARTILAGE SURGERY Right 1968  . KNEE SURGERY     bilateral meniscal surgeries  .  LUNG LOBECTOMY     upper right lung 12/15/2007   . 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 UPPER GI ENDOSCOPY      Social History   Socioeconomic History  . Marital status: Married    Spouse name: Not on file  . Number of children: 3  . Years of education: Not on file  . Highest education level: Not on file   Occupational History  . Occupation: Animal nutritionist    Comment: retired  Tobacco Use  . Smoking status: Never Smoker  . Smokeless tobacco: Never Used  Vaping Use  . Vaping Use: Never used  Substance and Sexual Activity  . Alcohol use: Yes    Comment: Once glass of wine per month, none last 24hrs  . Drug use: Never  . Sexual activity: Yes  Other Topics Concern  . Not on file  Social History Narrative   Married   Animal nutritionist   Wife is nurse    Has a daughter in [place]; son and daughter in [place]    6 grandchildren   [place] from [place]; spent some time in [place]; moved here 2019.    Lives at [facility]      Has advanced directives   Daughter [name] is health care POA   Would accept resuscitation attempts   No feeding tube if cognitively unaware   Social Determinants of Health   Financial Resource Strain: Not on file  Food Insecurity: Not on file  Transportation Needs: Not on file  Physical Activity: Not on file  Stress: Not on file  Social Connections: Not on file  Intimate Partner Violence: Not on file    Family History  Problem Relation Age of Onset  . Cancer Mother   . Alzheimer's disease Mother   . Stroke Maternal Grandmother   . Hypertension Father   . Alzheimer's disease Father      Current Outpatient Medications:  .  cetirizine (ZYRTEC) 10 MG tablet, Take 10 mg by mouth daily., Disp: , Rfl:  .  cholecalciferol (VITAMIN D3) 25 MCG (1000 UT) tablet, Take 5,000 Units by mouth daily., Disp: , Rfl:  .  diclofenac Sodium (VOLTAREN) 1 % GEL, Apply 4gm to both knees 4 times daily as needed., Disp: 100 g, Rfl: 11 .  fluticasone (FLONASE) 50 MCG/ACT nasal spray, Place 1 spray into both nostrils 2 (two) times daily., Disp: , Rfl:  .  gabapentin (NEURONTIN) 300 MG capsule, TAKE 1 CAPSULE BY MOUTH TWICE DAILY, Disp: 180 capsule, Rfl: 0 .  levothyroxine (SYNTHROID) 50 MCG tablet, TAKE 1 TABLET BY MOUTH ONCE A DAY BEFOREBREAKFAST., Disp: 90 tablet, Rfl: 0 .   meloxicam (MOBIC) 15 MG tablet, TAKE ONE TABLET BY MOUTH DAILY, Disp: 90 tablet, Rfl: 1 .  montelukast (SINGULAIR) 10 MG tablet, TAKE 1 TABLET BY MOUTH EVERY NIGHT AT BEDTIME, Disp: 90 tablet, Rfl: 3 .  Multiple Vitamin (MULTIVITAMIN WITH MINERALS) TABS tablet, Take 1 tablet by mouth daily., Disp: , Rfl:  .  mupirocin ointment (BACTROBAN) 2 %, Apply 1 application topically 3 (three) times daily., Disp: 22 g, Rfl: 0 .  Probiotic Product (PROBIOTIC PO), Take 1 capsule by mouth 2 (two) times daily., Disp: , Rfl:   Physical exam:  Vitals:   12/18/20 1211  BP: 114/62  Pulse: (!) 55  Resp: 20  Temp: (!) 97.4 F (36.3 C)  TempSrc: Tympanic  SpO2: 99%  Weight: 187 lb 9.6 oz (85.1 kg)   Physical Exam Constitutional:      General:  He is not in acute distress. Cardiovascular:     Rate and Rhythm: Normal rate and regular rhythm.     Heart sounds: Normal heart sounds.  Pulmonary:     Effort: Pulmonary effort is normal.     Breath sounds: Normal breath sounds.  Abdominal:     General: Bowel sounds are normal.     Palpations: Abdomen is soft.  Lymphadenopathy:     Comments: No palpable cervical, supraclavicular, axillary or inguinal adenopathy   Skin:    General: Skin is warm and dry.  Neurological:     Mental Status: He is alert and oriented to person, place, and time.      CMP Latest Ref Rng & Units 11/15/2020  Glucose 70 - 99 mg/dL 88  BUN 8 - 23 mg/dL 18  Creatinine 0.61 - 1.24 mg/dL 0.66  Sodium 135 - 145 mmol/L 139  Potassium 3.5 - 5.1 mmol/L 3.6  Chloride 98 - 111 mmol/L 104  CO2 22 - 32 mmol/L 28  Calcium 8.9 - 10.3 mg/dL 8.4(L)  Total Protein 6.5 - 8.1 g/dL 6.4(L)  Total Bilirubin 0.3 - 1.2 mg/dL 0.4  Alkaline Phos 38 - 126 U/L 56  AST 15 - 41 U/L 25  ALT 0 - 44 U/L 30   CBC Latest Ref Rng & Units 11/15/2020  WBC 4.0 - 10.5 K/uL 6.2  Hemoglobin 13.0 - 17.0 g/dL 10.5(L)  Hematocrit 39.0 - 52.0 % 33.4(L)  Platelets 150 - 400 K/uL 386      Assessment and plan-  Patient is a 80 y.o. 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  He is here to discuss recent CT abdomen findings and further management  I have reviewed CT abdomen pelvis images independently and discussed.  I have also discussed his case with Dr. Kathlene Cote from radiology today.CT abdomen was done when patient was admitted for an episode of gallstone pancreatitis.  At that time he was noted to have haziness along the retroperitoneum surrounding the aorta and inferior vena cava and per my discussion with Dr. Kathlene Cote this appears to be more like retroperitoneal edema.  There was a possible differential of retroperitoneal fibrosis versus lymphoproliferative disorder given in the report.  However patient has no evidence of intra-abdominal adenopathy.  No palpable adenopathy on today's exam.  No evidence of splenomegaly.  Likelihood of this retroperitoneal haziness being lymphoproliferative disorder is very low.  At this time I would like to repeat his CT abdomen in June 2022 and based on those findings decide what more needs to be done.  Patient and his daughter comprehend my plan well  At my next visit I will also plan to set him up with Melinda Crutch again for low-dose screening CT chest   Visit Diagnosis 1. Abnormal CT of the abdomen      Dr. Randa Evens, MD, MPH [facility] [phone] 12/19/2020 6:29 PM

## 2020-12-21 ENCOUNTER — Other Ambulatory Visit: Payer: Medicare Other

## 2020-12-21 DIAGNOSIS — K659 Peritonitis, unspecified: Secondary | ICD-10-CM | POA: Diagnosis not present

## 2020-12-21 DIAGNOSIS — M19012 Primary osteoarthritis, left shoulder: Secondary | ICD-10-CM | POA: Diagnosis not present

## 2020-12-21 DIAGNOSIS — Z48815 Encounter for surgical aftercare following surgery on the digestive system: Secondary | ICD-10-CM | POA: Diagnosis not present

## 2020-12-21 DIAGNOSIS — R278 Other lack of coordination: Secondary | ICD-10-CM | POA: Diagnosis not present

## 2020-12-21 DIAGNOSIS — E7849 Other hyperlipidemia: Secondary | ICD-10-CM | POA: Diagnosis not present

## 2020-12-21 DIAGNOSIS — Z741 Need for assistance with personal care: Secondary | ICD-10-CM | POA: Diagnosis not present

## 2020-12-24 DIAGNOSIS — H2513 Age-related nuclear cataract, bilateral: Secondary | ICD-10-CM | POA: Diagnosis not present

## 2020-12-24 DIAGNOSIS — M19012 Primary osteoarthritis, left shoulder: Secondary | ICD-10-CM | POA: Diagnosis not present

## 2020-12-24 DIAGNOSIS — M24012 Loose body in left shoulder: Secondary | ICD-10-CM | POA: Diagnosis not present

## 2020-12-24 DIAGNOSIS — G8929 Other chronic pain: Secondary | ICD-10-CM | POA: Diagnosis not present

## 2020-12-24 DIAGNOSIS — M25512 Pain in left shoulder: Secondary | ICD-10-CM | POA: Diagnosis not present

## 2020-12-24 DIAGNOSIS — M7522 Bicipital tendinitis, left shoulder: Secondary | ICD-10-CM | POA: Diagnosis not present

## 2020-12-24 DIAGNOSIS — M67922 Unspecified disorder of synovium and tendon, left upper arm: Secondary | ICD-10-CM | POA: Diagnosis not present

## 2020-12-26 DIAGNOSIS — Z20822 Contact with and (suspected) exposure to covid-19: Secondary | ICD-10-CM | POA: Diagnosis not present

## 2020-12-27 ENCOUNTER — Other Ambulatory Visit: Payer: Self-pay | Admitting: Family

## 2020-12-27 DIAGNOSIS — Z902 Acquired absence of lung [part of]: Secondary | ICD-10-CM

## 2021-01-01 DIAGNOSIS — M25512 Pain in left shoulder: Secondary | ICD-10-CM | POA: Diagnosis not present

## 2021-01-01 DIAGNOSIS — M19012 Primary osteoarthritis, left shoulder: Secondary | ICD-10-CM | POA: Diagnosis not present

## 2021-01-02 ENCOUNTER — Ambulatory Visit: Payer: Medicare Other | Admitting: Internal Medicine

## 2021-01-04 DIAGNOSIS — M25512 Pain in left shoulder: Secondary | ICD-10-CM | POA: Diagnosis not present

## 2021-01-04 DIAGNOSIS — M19012 Primary osteoarthritis, left shoulder: Secondary | ICD-10-CM | POA: Diagnosis not present

## 2021-01-07 DIAGNOSIS — M25512 Pain in left shoulder: Secondary | ICD-10-CM | POA: Diagnosis not present

## 2021-01-07 DIAGNOSIS — M19012 Primary osteoarthritis, left shoulder: Secondary | ICD-10-CM | POA: Diagnosis not present

## 2021-01-09 ENCOUNTER — Ambulatory Visit: Payer: Medicare Other

## 2021-01-10 ENCOUNTER — Ambulatory Visit (INDEPENDENT_AMBULATORY_CARE_PROVIDER_SITE_OTHER): Payer: Medicare Other | Admitting: Internal Medicine

## 2021-01-10 ENCOUNTER — Other Ambulatory Visit: Payer: Self-pay

## 2021-01-10 ENCOUNTER — Encounter: Payer: Self-pay | Admitting: Internal Medicine

## 2021-01-10 VITALS — BP 114/78 | HR 59 | Temp 97.4°F | Ht 70.5 in | Wt 180.0 lb

## 2021-01-10 DIAGNOSIS — I700 Atherosclerosis of aorta: Secondary | ICD-10-CM

## 2021-01-10 DIAGNOSIS — E039 Hypothyroidism, unspecified: Secondary | ICD-10-CM

## 2021-01-10 DIAGNOSIS — M1990 Unspecified osteoarthritis, unspecified site: Secondary | ICD-10-CM

## 2021-01-10 DIAGNOSIS — J301 Allergic rhinitis due to pollen: Secondary | ICD-10-CM | POA: Diagnosis not present

## 2021-01-10 DIAGNOSIS — T451X5A Adverse effect of antineoplastic and immunosuppressive drugs, initial encounter: Secondary | ICD-10-CM | POA: Diagnosis not present

## 2021-01-10 DIAGNOSIS — G620 Drug-induced polyneuropathy: Secondary | ICD-10-CM | POA: Diagnosis not present

## 2021-01-10 DIAGNOSIS — K219 Gastro-esophageal reflux disease without esophagitis: Secondary | ICD-10-CM | POA: Diagnosis not present

## 2021-01-10 DIAGNOSIS — Z0000 Encounter for general adult medical examination without abnormal findings: Secondary | ICD-10-CM

## 2021-01-10 MED ORDER — MELOXICAM 15 MG PO TABS
1.0000 | ORAL_TABLET | Freq: Every day | ORAL | 3 refills | Status: DC
Start: 2021-01-10 — End: 2023-01-05

## 2021-01-10 NOTE — Assessment & Plan Note (Signed)
Discussed using the famotidine if more dysphagia

## 2021-01-10 NOTE — Progress Notes (Signed)
Subjective:    Patient ID: Kevin Terrell, male    DOB: 22-Dec-1940, 80 y.o.   MRN: [id number]  HPI Here for Medicare wellness visit and follow up of chronic health conditions This visit occurred during the SARS-CoV-2 public health emergency.  Safety protocols were in place, including screening questions prior to the visit, additional usage of staff PPE, and extensive cleaning of exam room while observing appropriate contact time as indicated for disinfecting solutions.   Reviewed form and advanced directives Reviewed other doctors Occasional glass of wine No tobacco Exercises regularly Vision is okay. May need cataract surgery in a few years No hearing problems No falls No depression or anhedonia Independent with instrumental ADLs No memory problems  Feels 85-90% recovered from surgery Starting to work again Some left shoulder problems---going to PT for this Got injection in biceps tendon from Dr Candelaria Stagers. May need another in the joint as well Uses the meloxicam prn---but daily just of late  Multiple actinic keratoses Will get blue light therapy next week due to persistence Having laser to toenail fungus---helping (Dr Milinda Pointer)  Satisfied with allergy regimen  Past statin--he wanted to get off Does have aortic atherosclerosis--but total only 117  Ongoing nerve pain since chemo Gabapentin bid is helpful Current Outpatient Medications on File Prior to Visit  Medication Sig Dispense Refill  . cetirizine (ZYRTEC) 10 MG tablet Take 10 mg by mouth daily.    . cholecalciferol (VITAMIN D3) 25 MCG (1000 UT) tablet Take 1,000 Units by mouth daily.    . diclofenac Sodium (VOLTAREN) 1 % GEL Apply 4gm to both knees 4 times daily as needed. 100 g 11  . fluticasone (FLONASE) 50 MCG/ACT nasal spray Place 1 spray into both nostrils 2 (two) times daily.    [place] Kitchen gabapentin (NEURONTIN) 300 MG capsule TAKE 1 CAPSULE BY MOUTH TWICE DAILY 180 capsule 0  . levothyroxine (SYNTHROID) 50 MCG tablet  TAKE 1 TABLET BY MOUTH ONCE A DAY BEFOREBREAKFAST. 90 tablet 0  . meloxicam (MOBIC) 15 MG tablet TAKE ONE TABLET BY MOUTH DAILY 90 tablet 1  . montelukast (SINGULAIR) 10 MG tablet TAKE 1 TABLET BY MOUTH EVERY NIGHT AT BEDTIME 90 tablet 3  . Multiple Vitamin (MULTIVITAMIN WITH MINERALS) TABS tablet Take 1 tablet by mouth daily.    . mupirocin ointment (BACTROBAN) 2 % Apply 1 application topically 3 (three) times daily. 22 g 0  . Probiotic Product (PROBIOTIC PO) Take 1 capsule by mouth 2 (two) times daily.     No current facility-administered medications on file prior to visit.    Allergies  Allergen Reactions  . Compazine [Prochlorperazine Edisylate]     muscle spasmus   . Other     Okra - hives    Past Medical History:  Diagnosis Date  . Actinic keratosis   . Arthritis   . 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 GERD (gastroesophageal reflux disease)    with atypical cough  . Hypothyroidism   . Lung cancer ([place]) 2009   chemo, NO radiation. treated in [place]  . Seborrheic dermatitis     Past Surgical History:  Procedure Laterality Date  . COLONOSCOPY    . COLONOSCOPY WITH PROPOFOL N/A 11/30/2019   Procedure: COLONOSCOPY WITH PROPOFOL;  Surgeon: Lin Landsman, MD;  Location: [facility] ENDOSCOPY;  Service: Gastroenterology;  Laterality: N/A;  . EYE SURGERY     vitreous detachment---laser  . KNEE CARTILAGE SURGERY Right 1968  . KNEE SURGERY     bilateral meniscal  surgeries  . LUNG LOBECTOMY     upper right lung 12/15/2007   . 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 UPPER GI ENDOSCOPY      Family History  Problem Relation Age of Onset  . Cancer Mother   . Alzheimer's disease Mother   . Stroke Maternal Grandmother   . Hypertension Father   . Alzheimer's disease Father     Social History   Socioeconomic History  . Marital status: Married    Spouse name: Not on file  . Number of children: 3  . Years of education: Not on file  . Highest education  level: Not on file  Occupational History  . Occupation: Animal nutritionist    Comment: retired  Tobacco Use  . Smoking status: Never Smoker  . Smokeless tobacco: Never Used  Vaping Use  . Vaping Use: Never used  Substance and Sexual Activity  . Alcohol use: Yes    Comment: Once glass of wine per month, none last 24hrs  . Drug use: Never  . Sexual activity: Yes  Other Topics Concern  . Not on file  Social History Narrative   Married   Animal nutritionist   Wife is nurse    Has a daughter in [place]; son and daughter in [place]    6 grandchildren   [place] from [place]; spent some time in [place]; moved here 2019.    Lives at [facility]      Has advanced directives   Daughter [name] is health care POA   Would accept resuscitation attempts   No feeding tube if cognitively unaware   Social Determinants of Health   Financial Resource Strain: Not on file  Food Insecurity: Not on file  Transportation Needs: Not on file  Physical Activity: Not on file  Stress: Not on file  Social Connections: Not on file  Intimate Partner Violence: Not on file   Review of Systems Appetite is back to normal Has regained some of his lost weight Sleeps well Wears seat belt Teeth okay--keeps up with dentist No chest pain or SOB No dizziness or syncope Occasional heartburn---hasn't needed the famotidine lately (healthy eating). Will rarely have brief dysphagia when snacking--water resolves Bowels are fine Voids okay---stream is slow but empties okay (slight dribbling) No other skin issues     Objective:   Physical Exam Constitutional:      Appearance: Normal appearance.  HENT:     Mouth/Throat:     Comments: No lesions Eyes:     Conjunctiva/sclera: Conjunctivae normal.     Pupils: Pupils are equal, round, and reactive to light.  Cardiovascular:     Rate and Rhythm: Normal rate and regular rhythm.     Pulses: Normal pulses.     Heart sounds: No murmur heard. No gallop.   Pulmonary:      Effort: Pulmonary effort is normal.     Breath sounds: Normal breath sounds. No wheezing or rales.  Abdominal:     Palpations: Abdomen is soft.     Tenderness: There is no abdominal tenderness.  Musculoskeletal:     Cervical back: Neck supple.     Right lower leg: No edema.     Left lower leg: No edema.  Lymphadenopathy:     Cervical: No cervical adenopathy.  Skin:    General: Skin is warm.     Findings: No rash.  Neurological:     Mental Status: He is alert and oriented to person, place, and time.     Comments:  President--"Kevin Terrell, Kevin Terrell, Kevin Terrell" [id number] Recall-- 3/3  Psychiatric:        Mood and Affect: Mood normal.        Behavior: Behavior normal.            Assessment & Plan:

## 2021-01-10 NOTE — Progress Notes (Signed)
Hearing Screening   Method: Audiometry   125Hz  250Hz  500Hz  1000Hz  2000Hz  3000Hz  4000Hz  6000Hz  8000Hz   Right ear:   20 20 20  20     Left ear:   20 20 20  20     Vision Screening Comments: April 2022

## 2021-01-10 NOTE — Assessment & Plan Note (Signed)
I have personally reviewed the Medicare Annual Wellness questionnaire and have noted 1. The patient's medical and social history 2. Their use of alcohol, tobacco or illicit drugs 3. Their current medications and supplements 4. The patient's functional ability including ADL's, fall risks, home safety risks and hearing or visual             impairment. 5. Diet and physical activities 6. Evidence for depression or mood disorders  The patients weight, height, BMI and visual acuity have been recorded in the chart I have made referrals, counseling and provided education to the patient based review of the above and I have provided the pt with a written personalized care plan for preventive services.  I have provided you with a copy of your personalized plan for preventive services. Please take the time to review along with your updated medication list.  Done with colonoscopies --polyps last year No PSA due to age COVID booster  Flu vaccine in the fall Getting back to exercise

## 2021-01-10 NOTE — Assessment & Plan Note (Signed)
On imaging Prefers no statin

## 2021-01-10 NOTE — Assessment & Plan Note (Signed)
Seems to be euthyroid on levothyroxine

## 2021-01-10 NOTE — Assessment & Plan Note (Signed)
Doing well on the gabapentin

## 2021-01-10 NOTE — Assessment & Plan Note (Signed)
Doing okay with flonase and montelukast

## 2021-01-11 DIAGNOSIS — M19012 Primary osteoarthritis, left shoulder: Secondary | ICD-10-CM | POA: Diagnosis not present

## 2021-01-11 DIAGNOSIS — M25512 Pain in left shoulder: Secondary | ICD-10-CM | POA: Diagnosis not present

## 2021-01-14 DIAGNOSIS — M25512 Pain in left shoulder: Secondary | ICD-10-CM | POA: Diagnosis not present

## 2021-01-14 DIAGNOSIS — M19012 Primary osteoarthritis, left shoulder: Secondary | ICD-10-CM | POA: Diagnosis not present

## 2021-01-15 ENCOUNTER — Encounter: Payer: Self-pay | Admitting: Dermatology

## 2021-01-15 ENCOUNTER — Ambulatory Visit (INDEPENDENT_AMBULATORY_CARE_PROVIDER_SITE_OTHER): Payer: Medicare Other

## 2021-01-15 ENCOUNTER — Other Ambulatory Visit: Payer: Self-pay

## 2021-01-15 DIAGNOSIS — L570 Actinic keratosis: Secondary | ICD-10-CM | POA: Diagnosis not present

## 2021-01-15 MED ORDER — AMINOLEVULINIC ACID HCL 20 % EX SOLR
1.0000 "application " | Freq: Once | CUTANEOUS | Status: AC
  Administered 2021-01-15: 354 mg via TOPICAL

## 2021-01-15 NOTE — Progress Notes (Signed)
Patient completed PDT therapy today.  1. AK (actinic keratosis) Head - Anterior (Face)  Photodynamic therapy - Head - Anterior (Face) Procedure discussed: discussed risks, benefits, side effects. and alternatives   Prep: site scrubbed/prepped with acetone   Location:  Face and frontal hairline Number of lesions:  Multiple Type of treatment:  Blue light Aminolevulinic Acid (see MAR for details): Levulan Number of Levulan sticks used:  1 Incubation time (minutes):  60 Number of minutes under lamp:  16 Number of seconds under lamp:  40 Cooling:  Floor fan Outcome: patient tolerated procedure well with no complications   Post-procedure details: sunscreen applied

## 2021-01-15 NOTE — Patient Instructions (Signed)

## 2021-01-17 DIAGNOSIS — Z23 Encounter for immunization: Secondary | ICD-10-CM | POA: Diagnosis not present

## 2021-01-18 DIAGNOSIS — M19012 Primary osteoarthritis, left shoulder: Secondary | ICD-10-CM | POA: Diagnosis not present

## 2021-01-18 DIAGNOSIS — M25512 Pain in left shoulder: Secondary | ICD-10-CM | POA: Diagnosis not present

## 2021-01-21 DIAGNOSIS — M24012 Loose body in left shoulder: Secondary | ICD-10-CM | POA: Diagnosis not present

## 2021-01-21 DIAGNOSIS — M7522 Bicipital tendinitis, left shoulder: Secondary | ICD-10-CM | POA: Diagnosis not present

## 2021-01-21 DIAGNOSIS — M19012 Primary osteoarthritis, left shoulder: Secondary | ICD-10-CM | POA: Diagnosis not present

## 2021-01-21 DIAGNOSIS — G8929 Other chronic pain: Secondary | ICD-10-CM | POA: Diagnosis not present

## 2021-01-21 DIAGNOSIS — M67922 Unspecified disorder of synovium and tendon, left upper arm: Secondary | ICD-10-CM | POA: Diagnosis not present

## 2021-01-21 DIAGNOSIS — M25512 Pain in left shoulder: Secondary | ICD-10-CM | POA: Diagnosis not present

## 2021-01-23 ENCOUNTER — Other Ambulatory Visit: Payer: Self-pay | Admitting: Internal Medicine

## 2021-01-23 DIAGNOSIS — E039 Hypothyroidism, unspecified: Secondary | ICD-10-CM

## 2021-01-25 ENCOUNTER — Other Ambulatory Visit: Payer: Self-pay

## 2021-01-25 ENCOUNTER — Ambulatory Visit (INDEPENDENT_AMBULATORY_CARE_PROVIDER_SITE_OTHER): Payer: Self-pay

## 2021-01-25 DIAGNOSIS — M79676 Pain in unspecified toe(s): Secondary | ICD-10-CM

## 2021-01-25 DIAGNOSIS — B351 Tinea unguium: Secondary | ICD-10-CM

## 2021-01-25 NOTE — Patient Instructions (Signed)

## 2021-01-25 NOTE — Progress Notes (Signed)
Patient presents today for the 6th laser treatment. 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   Most of the nails are affected. The hallux nails have started to show some healthy nail growth.  All other systems are negative.  Nails were filed thin. Laser therapy was administered to 1-5 toenails bilateral and patient tolerated the treatment well. All safety precautions were in place.    Follow up in 3 months with Dr Milinda Pointer.   ~Take final pics next visit~

## 2021-02-02 DIAGNOSIS — Z20822 Contact with and (suspected) exposure to covid-19: Secondary | ICD-10-CM | POA: Diagnosis not present

## 2021-02-04 DIAGNOSIS — M19012 Primary osteoarthritis, left shoulder: Secondary | ICD-10-CM | POA: Diagnosis not present

## 2021-02-04 DIAGNOSIS — M25512 Pain in left shoulder: Secondary | ICD-10-CM | POA: Diagnosis not present

## 2021-02-08 DIAGNOSIS — M25512 Pain in left shoulder: Secondary | ICD-10-CM | POA: Diagnosis not present

## 2021-02-08 DIAGNOSIS — M19012 Primary osteoarthritis, left shoulder: Secondary | ICD-10-CM | POA: Diagnosis not present

## 2021-02-12 DIAGNOSIS — M25512 Pain in left shoulder: Secondary | ICD-10-CM | POA: Diagnosis not present

## 2021-02-12 DIAGNOSIS — M19012 Primary osteoarthritis, left shoulder: Secondary | ICD-10-CM | POA: Diagnosis not present

## 2021-02-13 ENCOUNTER — Ambulatory Visit (INDEPENDENT_AMBULATORY_CARE_PROVIDER_SITE_OTHER): Payer: Medicare Other | Admitting: Dermatology

## 2021-02-13 ENCOUNTER — Other Ambulatory Visit: Payer: Self-pay

## 2021-02-13 ENCOUNTER — Encounter: Payer: Self-pay | Admitting: Dermatology

## 2021-02-13 DIAGNOSIS — L821 Other seborrheic keratosis: Secondary | ICD-10-CM | POA: Diagnosis not present

## 2021-02-13 DIAGNOSIS — L820 Inflamed seborrheic keratosis: Secondary | ICD-10-CM

## 2021-02-13 DIAGNOSIS — L578 Other skin changes due to chronic exposure to nonionizing radiation: Secondary | ICD-10-CM

## 2021-02-13 DIAGNOSIS — L570 Actinic keratosis: Secondary | ICD-10-CM

## 2021-02-13 NOTE — Patient Instructions (Signed)
Cryotherapy Aftercare  Wash gently with soap and water everyday.   Apply Vaseline and Band-Aid daily until healed.  Prior to procedure, discussed risks of blister formation, small wound, skin dyspigmentation, or rare scar following cryotherapy. Recommend Vaseline ointment to treated areas while healing.    If you have any questions or concerns for your doctor, please call our main line at [phone] and press option 4 to reach your doctor's medical assistant. If no one answers, please leave a voicemail as directed and we will return your call as soon as possible. Messages left after 4 pm will be answered the following business day.   You may also send us a message via MyChart. We typically respond to MyChart messages within 1-2 business days.  For prescription refills, please ask your pharmacy to contact our office. Our fax number is [phone].  If you have an urgent issue when the clinic is closed that cannot wait until the next business day, you can page your doctor at the number below.    Please note that while we do our best to be available for urgent issues outside of office hours, we are not available 24/7.   If you have an urgent issue and are unable to reach us, you may choose to seek medical care at your doctor's office, retail clinic, urgent care center, or emergency room.  If you have a medical emergency, please immediately call 911 or go to the emergency department.  Pager Numbers  - Dr. Kowalski: [phone]  - Dr. Moye: [phone]  - Dr. Stewart: [phone]  In the event of inclement weather, please call our main line at [phone] for an update on the status of any delays or closures.  Dermatology Medication Tips: Please keep the boxes that topical medications come in in order to help keep track of the instructions about where and how to use these. Pharmacies typically print the medication instructions only on the boxes and not directly on the medication  tubes.   If your medication is too expensive, please contact our office at [phone] option 4 or send us a message through MyChart.   We are unable to tell what your co-pay for medications will be in advance as this is different depending on your insurance coverage. However, we may be able to find a substitute medication at lower cost or fill out paperwork to get insurance to cover a needed medication.   If a prior authorization is required to get your medication covered by your insurance company, please allow us 1-2 business days to complete this process.  Drug prices often vary depending on where the prescription is filled and some pharmacies may offer cheaper prices.  The website www.goodrx.com contains coupons for medications through different pharmacies. The prices here do not account for what the cost may be with help from insurance (it may be cheaper with your insurance), but the website can give you the price if you did not use any insurance.  - You can print the associated coupon and take it with your prescription to the pharmacy.  - You may also stop by our office during regular business hours and pick up a GoodRx coupon card.  - If you need your prescription sent electronically to a different pharmacy, notify our office through [place] MyChart or by phone at [phone] option 4.  

## 2021-02-13 NOTE — Progress Notes (Signed)
   Follow-Up Visit   Subjective  Kevin Terrell is a 80 y.o. male who presents for the following: Follow-up (Patient here today for AK follow up at the face. Patient advises he has a nice reaction but not too bad. Patient also has a spot at right shoulder and left eyebrow he would like checked. ).  The following portions of the chart were reviewed this encounter and updated as appropriate:   Tobacco  Allergies  Meds  Problems  Med Hx  Surg Hx  Fam Hx       Review of Systems:  No other skin or systemic complaints except as noted in HPI or Assessment and Plan.  Objective  Well appearing patient in no apparent distress; mood and affect are within normal limits.  A focused examination was performed including face, shoulders, neck. Relevant physical exam findings are noted in the Assessment and Plan.  right temple x 1, right brow x 2, left brow x 2, left temple x 1 (6) Erythematous thin papules/macules with gritty scale.   Left base of neck Erythematous keratotic or waxy stuck-on papule or plaque.    Assessment & Plan  AK (actinic keratosis) (6) right temple x 1, right brow x 2, left brow x 2, left temple x 1  Prior to procedure, discussed risks of blister formation, small wound, skin dyspigmentation, or rare scar following cryotherapy. Recommend Vaseline ointment to treated areas while healing.   Destruction of lesion - right temple x 1, right brow x 2, left brow x 2, left temple x 1  Destruction method: cryotherapy   Informed consent: discussed and consent obtained   Lesion destroyed using liquid nitrogen: Yes   Cryotherapy cycles:  2 Outcome: patient tolerated procedure well with no complications   Post-procedure details: wound care instructions given    Inflamed seborrheic keratosis Left base of neck  Irritated  Prior to procedure, discussed risks of blister formation, small wound, skin dyspigmentation, or rare scar following cryotherapy. Recommend Vaseline ointment  to treated areas while healing.   Destruction of lesion - Left base of neck  Destruction method: cryotherapy   Informed consent: discussed and consent obtained   Lesion destroyed using liquid nitrogen: Yes   Cryotherapy cycles:  2 Outcome: patient tolerated procedure well with no complications   Post-procedure details: wound care instructions given    Seborrheic Keratoses - Stuck-on, waxy, tan-brown papules and/or plaques  - Benign-appearing - Discussed benign etiology and prognosis. - Observe - Call for any changes  Actinic Damage - improved s/p 5FU/calcipotriene therapy to face - chronic, 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  - Recommend staying in the shade or wearing long sleeves, sun glasses (UVA+UVB protection) and wide brim hats (4-inch brim around the entire circumference of the hat). - Call for new or changing lesions.  Return for 3-4 month AK follow up, 1 year TBSE.  Graciella Belton, RMA, am acting as scribe for Forest Gleason, MD .  Documentation: I have reviewed the above documentation for accuracy and completeness, and I agree with the above.  Forest Gleason, MD

## 2021-02-15 DIAGNOSIS — M19012 Primary osteoarthritis, left shoulder: Secondary | ICD-10-CM | POA: Diagnosis not present

## 2021-02-15 DIAGNOSIS — M25512 Pain in left shoulder: Secondary | ICD-10-CM | POA: Diagnosis not present

## 2021-02-19 DIAGNOSIS — M25512 Pain in left shoulder: Secondary | ICD-10-CM | POA: Diagnosis not present

## 2021-02-19 DIAGNOSIS — M19012 Primary osteoarthritis, left shoulder: Secondary | ICD-10-CM | POA: Diagnosis not present

## 2021-02-22 DIAGNOSIS — M19012 Primary osteoarthritis, left shoulder: Secondary | ICD-10-CM | POA: Diagnosis not present

## 2021-02-22 DIAGNOSIS — M25512 Pain in left shoulder: Secondary | ICD-10-CM | POA: Diagnosis not present

## 2021-02-26 DIAGNOSIS — M19012 Primary osteoarthritis, left shoulder: Secondary | ICD-10-CM | POA: Diagnosis not present

## 2021-02-26 DIAGNOSIS — M25512 Pain in left shoulder: Secondary | ICD-10-CM | POA: Diagnosis not present

## 2021-02-28 ENCOUNTER — Telehealth: Payer: Self-pay | Admitting: Oncology

## 2021-02-28 ENCOUNTER — Other Ambulatory Visit: Payer: Self-pay

## 2021-02-28 DIAGNOSIS — R935 Abnormal findings on diagnostic imaging of other abdominal regions, including retroperitoneum: Secondary | ICD-10-CM

## 2021-02-28 NOTE — Telephone Encounter (Signed)
Pt called and stated that he would like for his cardiologist Dr. Saunders Revel to be notified of his CT results whenever he completes the scan.

## 2021-03-01 DIAGNOSIS — M19012 Primary osteoarthritis, left shoulder: Secondary | ICD-10-CM | POA: Diagnosis not present

## 2021-03-01 DIAGNOSIS — Z20822 Contact with and (suspected) exposure to covid-19: Secondary | ICD-10-CM | POA: Diagnosis not present

## 2021-03-01 DIAGNOSIS — M25512 Pain in left shoulder: Secondary | ICD-10-CM | POA: Diagnosis not present

## 2021-03-05 DIAGNOSIS — M25512 Pain in left shoulder: Secondary | ICD-10-CM | POA: Diagnosis not present

## 2021-03-05 DIAGNOSIS — M19012 Primary osteoarthritis, left shoulder: Secondary | ICD-10-CM | POA: Diagnosis not present

## 2021-03-08 DIAGNOSIS — M67922 Unspecified disorder of synovium and tendon, left upper arm: Secondary | ICD-10-CM | POA: Diagnosis not present

## 2021-03-08 DIAGNOSIS — M19012 Primary osteoarthritis, left shoulder: Secondary | ICD-10-CM | POA: Diagnosis not present

## 2021-03-08 DIAGNOSIS — M25512 Pain in left shoulder: Secondary | ICD-10-CM | POA: Diagnosis not present

## 2021-03-08 DIAGNOSIS — G8929 Other chronic pain: Secondary | ICD-10-CM | POA: Diagnosis not present

## 2021-03-08 DIAGNOSIS — M24012 Loose body in left shoulder: Secondary | ICD-10-CM | POA: Diagnosis not present

## 2021-03-08 DIAGNOSIS — M7522 Bicipital tendinitis, left shoulder: Secondary | ICD-10-CM | POA: Diagnosis not present

## 2021-03-12 DIAGNOSIS — M19012 Primary osteoarthritis, left shoulder: Secondary | ICD-10-CM | POA: Diagnosis not present

## 2021-03-12 DIAGNOSIS — M25512 Pain in left shoulder: Secondary | ICD-10-CM | POA: Diagnosis not present

## 2021-03-13 ENCOUNTER — Ambulatory Visit
Admission: RE | Admit: 2021-03-13 | Discharge: 2021-03-13 | Disposition: A | Discharge status: Home / Self Care | Payer: Medicare Other | Source: Ambulatory Visit | Attending: Oncology | Admitting: Oncology

## 2021-03-13 ENCOUNTER — Other Ambulatory Visit: Payer: Self-pay

## 2021-03-13 DIAGNOSIS — J9811 Atelectasis: Secondary | ICD-10-CM | POA: Diagnosis not present

## 2021-03-13 DIAGNOSIS — C3490 Malignant neoplasm of unspecified part of unspecified bronchus or lung: Secondary | ICD-10-CM | POA: Insufficient documentation

## 2021-03-13 DIAGNOSIS — I700 Atherosclerosis of aorta: Secondary | ICD-10-CM | POA: Diagnosis not present

## 2021-03-13 IMAGING — CT CT CHEST W/O CM
2 of 4 series · 15 of 36 positions shown, 18 images · non-contrast
Comparison: CT abdomen pelvis [DATE], MR abdomen [DATE] and
CT chest [DATE].

CLINICAL DATA: Lung cancer.

EXAM:
CT CHEST WITHOUT CONTRAST
TECHNIQUE: Multidetector CT imaging of the chest was performed following the
standard protocol without IV contrast.

[Series 2: chest 2.00 · axial · 0.67mm/px · z∈[-1250,-956]mm · 12 of 175 slices shown, 15 images]
[im 14/175  mediastinal]
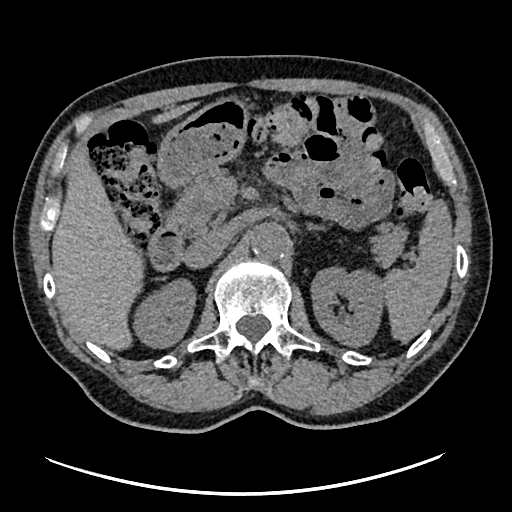
[im 14/175  lung]
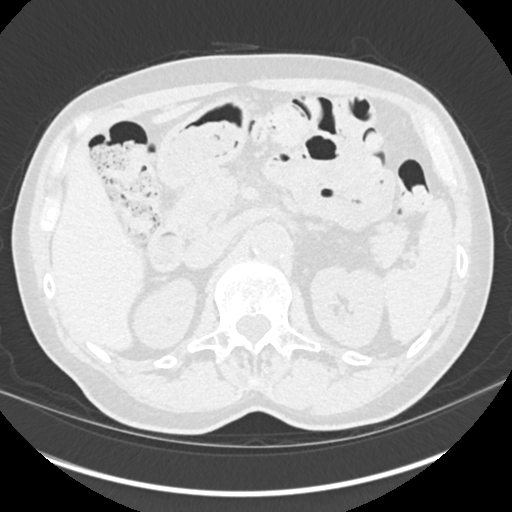
[im 27/175  lung]
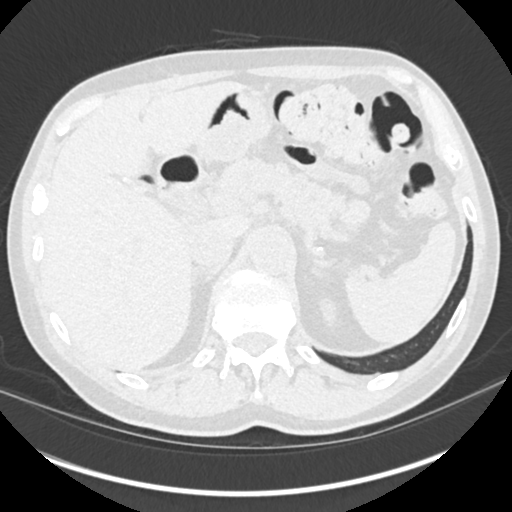
[im 41/175  lung]
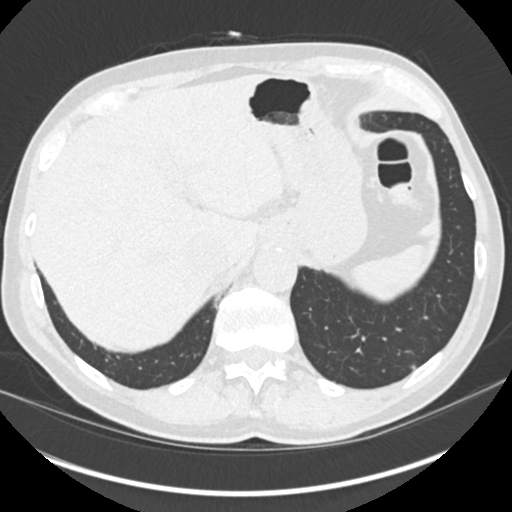
[im 54/175  lung]
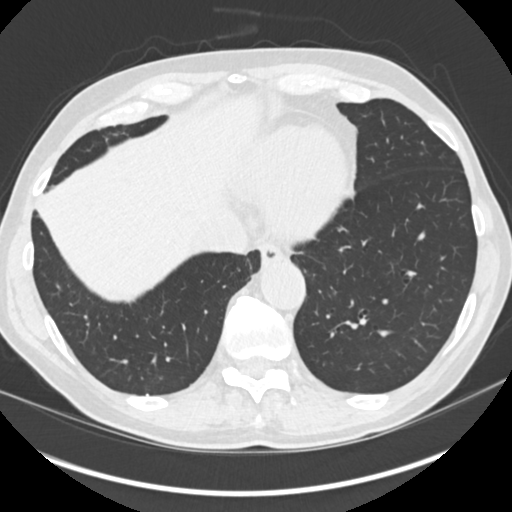
[im 67/175  mediastinal]
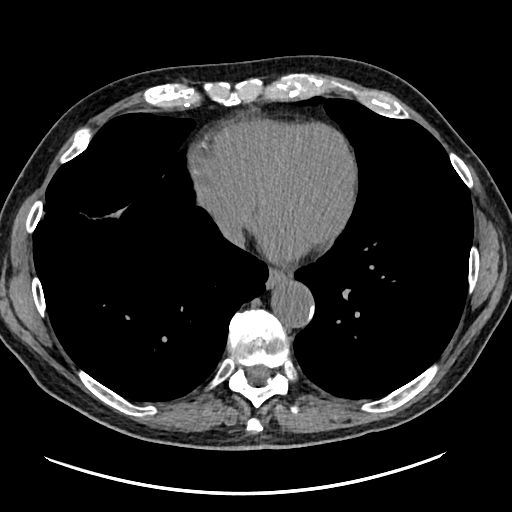
[im 67/175  lung]
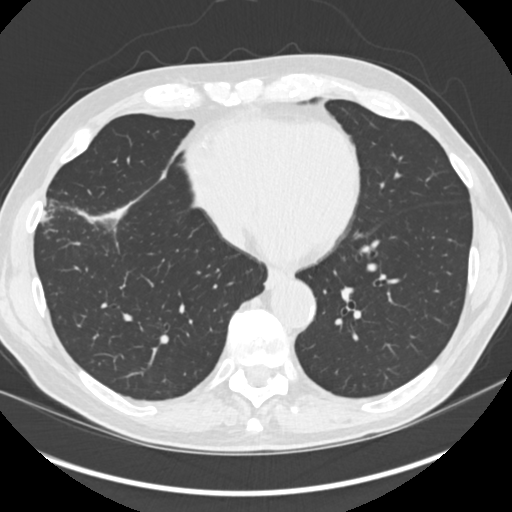
[im 81/175  lung]
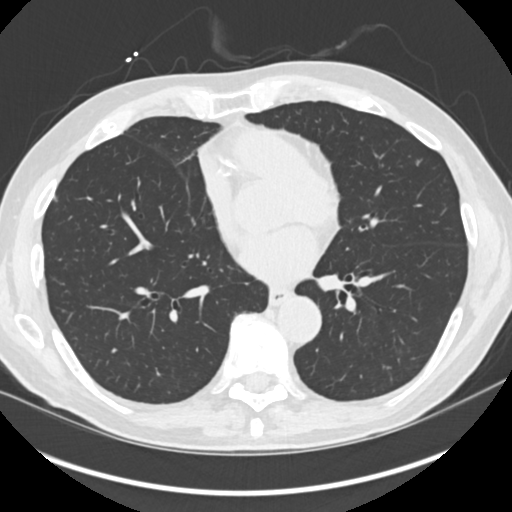
[im 94/175  lung]
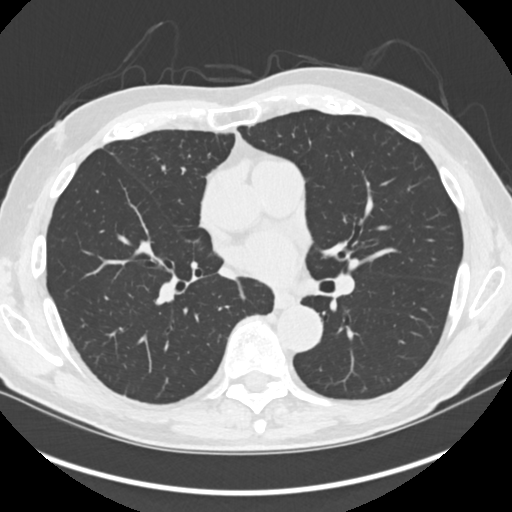
[im 108/175  lung]
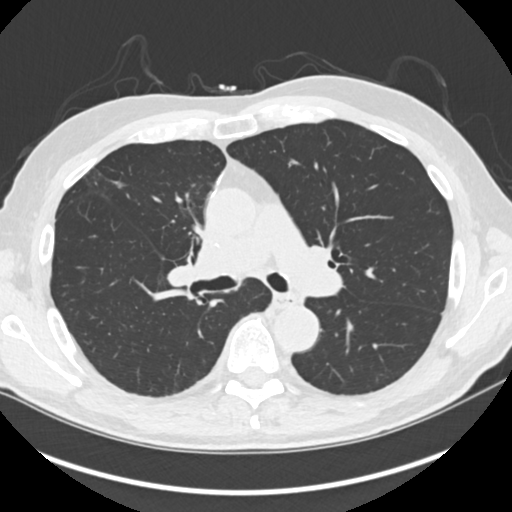
[im 121/175  mediastinal]
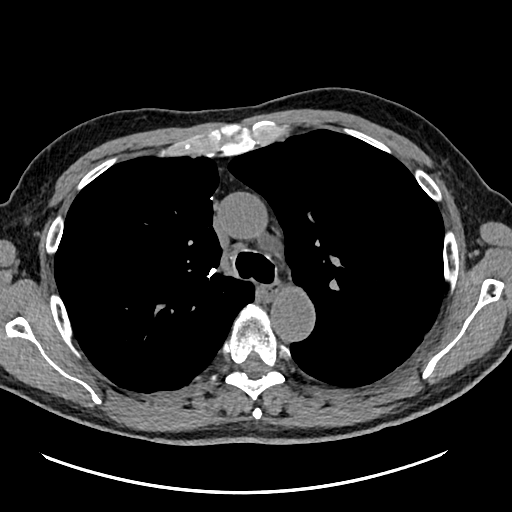
[im 121/175  lung]
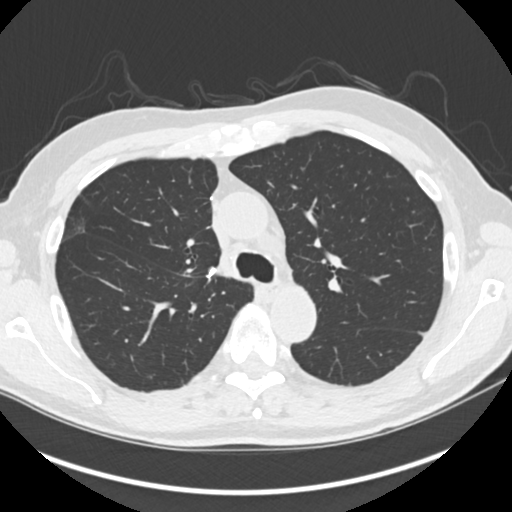
[im 134/175  lung]
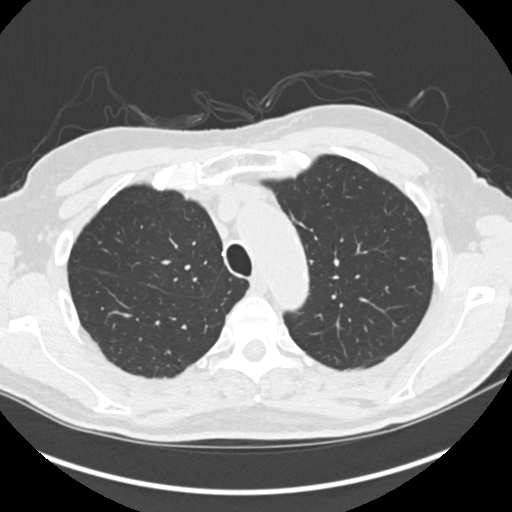
[im 148/175  lung]
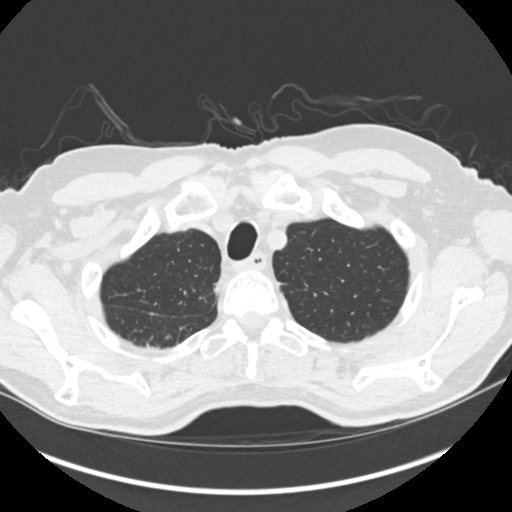
[im 161/175  lung]
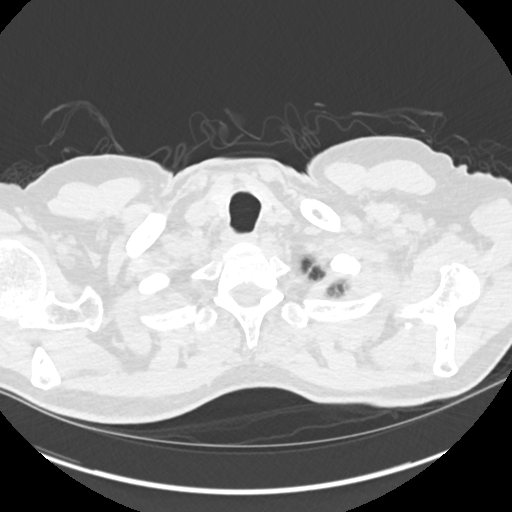

[Series 5: coronals chest 2.00 cor · coronal · 0.67mm/px · 3 of 140 slices shown]
[im 28/140  lung]
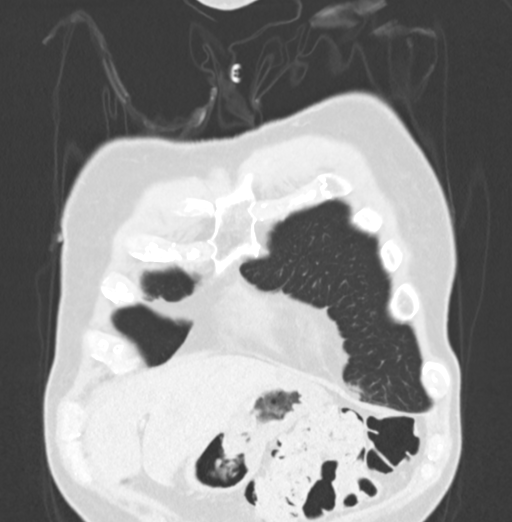
[im 56/140  lung]
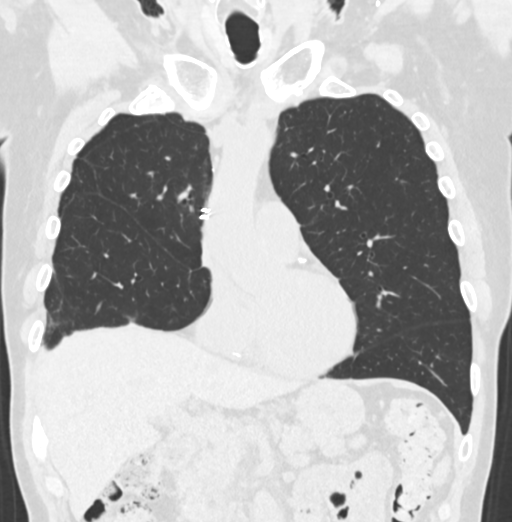
[im 84/140  lung]
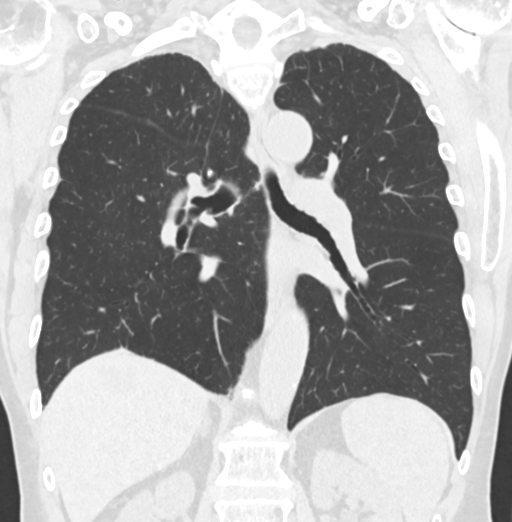

[15 of 36 positions shown; findings below may reference images not displayed]

FINDINGS: Cardiovascular: Atherosclerotic calcification of the aorta and
coronary arteries. Heart size normal. No pericardial effusion.

Mediastinum/Nodes: No pathologically enlarged mediastinal or
axillary lymph nodes. Hilar regions are difficult to definitively
evaluate without IV contrast. Esophagus is grossly unremarkable.

Lungs/Pleura: Biapical pleuroparenchymal scarring. Scattered
pulmonary parenchymal scarring. Probable focal subpleural
atelectasis in the left lower lobe (3/135). No suspicious pulmonary
nodules. No pleural fluid. Right upper lobectomy. Airway is
otherwise unremarkable.

Upper Abdomen: 1.5 cm low-attenuation lesion in the dome of the left
hepatic lobe is similar and a vague area of 2.6 cm area of decreased
attenuation in the dome of the right hepatic lobe, characterized as
a cyst and hemangioma, respectively, on [DATE]. Visualized
portions of the liver, adrenal glands, kidneys, spleen and pancreas
are otherwise unremarkable. Proximal gastric wall thickening.
Stomach is under distended. No upper abdominal adenopathy. Similar
mild retroperitoneal haziness.

Musculoskeletal: Degenerative changes in the spine. No worrisome
lytic or sclerotic lesions. Old right rib fractures.
IMPRESSION: 1. No evidence of recurrent or metastatic disease.
2. Proximal gastric wall thickening.
3. Aortic atherosclerosis ([RD]-[RD]). Coronary artery
calcification.

## 2021-03-14 ENCOUNTER — Telehealth: Payer: Self-pay | Admitting: Oncology

## 2021-03-14 ENCOUNTER — Telehealth: Payer: Self-pay | Admitting: Internal Medicine

## 2021-03-14 NOTE — Telephone Encounter (Signed)
Spoke to pt. He will ask her at his visit next week.

## 2021-03-14 NOTE — Telephone Encounter (Signed)
Dr Janese Banks did plant the repeat abdominal CT scan to make sure there was nothing to be concerned about (based on the prior study). She apparently put in an order for this to be done so he should check with her office about getting it scheduled.

## 2021-03-14 NOTE — Telephone Encounter (Signed)
Pt called and wanted to make sure that he still didn't need to have the Abdominal CT done at this time

## 2021-03-14 NOTE — Telephone Encounter (Signed)
Patient called requesting for his CT Abdomen and Pelvis ito the schedule as he got his CT Chest on 7/13.  CT AB/P scheduled and appointment with Dr. Janese Banks rescheduled.  Patient notified and confirmed.  Routing to clinical team for pre - CT lab orders requested by centralized scheduling.

## 2021-03-15 ENCOUNTER — Telehealth: Payer: Self-pay | Admitting: Internal Medicine

## 2021-03-15 DIAGNOSIS — M25512 Pain in left shoulder: Secondary | ICD-10-CM | POA: Diagnosis not present

## 2021-03-15 DIAGNOSIS — M19012 Primary osteoarthritis, left shoulder: Secondary | ICD-10-CM | POA: Diagnosis not present

## 2021-03-15 NOTE — Telephone Encounter (Signed)
Patient has an appointment on 07/20 with Dr End  Please have Dr End review most recent CT  Patient would like to know at visit the comparison of calcification in his aorta last year vs this year

## 2021-03-18 ENCOUNTER — Other Ambulatory Visit: Payer: Self-pay | Admitting: *Deleted

## 2021-03-18 ENCOUNTER — Inpatient Hospital Stay: Payer: Medicare Other | Admitting: Oncology

## 2021-03-18 DIAGNOSIS — C3490 Malignant neoplasm of unspecified part of unspecified bronchus or lung: Secondary | ICD-10-CM

## 2021-03-19 NOTE — Progress Notes (Signed)
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  He was exercising regularly with a stationary bike though he noted increasing knee pain.  No medication changes or additional testing were pursued.  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  He was hospitalized with gallbladder and pancreas issues in March, ultimately leading to cholecystectomy.  He has recovered from this.  His only complaint today is of pain in his left shoulder secondary to arthritis.  He is working with physical therapy.  He denies chest pain, shortness of breath, palpitations, lightheadedness, and edema.  He stopped rosuvastatin earlier this year after consulting with Dr. Silvio Pate, as it was felt that his lipids were well controlled after having transitioned to a vegan diet.  He underwent routine chest CT last week for follow-up of his lung cancer and inquires about any changes to his coronary artery calcification and aortic atherosclerosis.  --------------------------------------------------------------------------------------------------  Cardiovascular History & Procedures: Cardiovascular Problems: Hyperlipidemia Coronary artery calcification and aortic atherosclerosis on chest CT   Risk Factors: Hyperlipidemia, male gender, and age > 81   Cath/PCI: None   CV Surgery: None   EP Procedures and Devices: None   Non-Invasive Evaluation(s): Carotid Dopplers (12/06/2019): Minimal atherosclerotic plaquing without stenosis  involving the right carotid artery.  Normal left carotid artery.  Recent CV Pertinent Labs: Lab Results  Component Value Date   CHOL 117 10/31/2020   HDL 42 10/31/2020   LDLCALC 66 10/31/2020   TRIG 44 10/31/2020   CHOLHDL 2.8 10/31/2020   INR 1.4 (H) 11/04/2020   K 3.6 11/15/2020   MG 2.3 11/06/2020   BUN 18 11/15/2020   CREATININE 0.66 11/15/2020    Past medical and surgical history were reviewed and updated in EPIC.  Current Meds  Medication Sig   cetirizine (ZYRTEC) 10 MG tablet Take 10 mg by mouth daily.   cholecalciferol (VITAMIN D3) 25 MCG (1000 UT) tablet Take 1,000 Units by mouth daily.   diclofenac Sodium (VOLTAREN) 1 % GEL Apply 4gm to both knees 4 times daily as needed.   fluticasone (FLONASE) 50 MCG/ACT nasal spray Place 1 spray into both nostrils 2 (two) times daily.   gabapentin (NEURONTIN) 300 MG capsule TAKE 1 CAPSULE BY MOUTH TWICE DAILY   levothyroxine (SYNTHROID) 50 MCG tablet TAKE 1 TABLET BY MOUTH ONCE A DAY BEFOREBREAKFAST.   meloxicam (MOBIC) 15 MG tablet Take 1 tablet (15 mg total) by mouth daily. As needed   montelukast (SINGULAIR) 10 MG tablet TAKE 1 TABLET BY MOUTH EVERY NIGHT AT BEDTIME   Multiple Vitamin (MULTIVITAMIN WITH MINERALS) TABS tablet Take 1 tablet by mouth daily.   mupirocin ointment (BACTROBAN) 2 % Apply 1 application topically 3 (three) times daily.   Probiotic Product (PROBIOTIC PO) Take 1 capsule by mouth 2 (two) times daily.    Allergies: Compazine [prochlorperazine edisylate] and Other  Social History   Tobacco Use   Smoking status: Never   Smokeless tobacco: Never  Vaping Use   Vaping Use: Never used  Substance Use Topics   Alcohol use: Yes    Comment: Once  glass of wine per month, none last 24hrs   Drug use: Never    Family History  Problem Relation Age of Onset   Cancer Mother    Alzheimer's disease Mother    Stroke Maternal Grandmother    Hypertension Father    Alzheimer's disease Father     Review of  Systems: A 12-system review of systems was performed and was negative except as noted in the HPI.  --------------------------------------------------------------------------------------------------  Physical Exam: BP 100/74 (BP Location: Left Arm, Patient Position: Sitting, Cuff Size: Large)   Pulse 63   Ht 6\' 1"  (1.854 m)   Wt 185 lb (83.9 kg)   SpO2 98%   BMI 24.41 kg/m   General:  NAD. Neck: No JVD or HJR. Lungs: Clear to auscultation bilaterally without wheezes or crackles. Heart: Regular rate and rhythm without murmurs, rubs, or gallops. Abdomen: Soft, nontender, nondistended. Extremities: No lower extremity edema.  EKG: Normal sinus rhythm without abnormality.  Lab Results  Component Value Date   WBC 6.2 11/15/2020   HGB 10.5 (L) 11/15/2020   HCT 33.4 (L) 11/15/2020   MCV 95.7 11/15/2020   PLT 386 11/15/2020    Lab Results  Component Value Date   NA 139 11/15/2020   K 3.6 11/15/2020   CL 104 11/15/2020   CO2 28 11/15/2020   BUN 18 11/15/2020   CREATININE 0.66 11/15/2020   GLUCOSE 88 11/15/2020   ALT 30 11/15/2020    Lab Results  Component Value Date   CHOL 117 10/31/2020   HDL 42 10/31/2020   LDLCALC 66 10/31/2020   TRIG 44 10/31/2020   CHOLHDL 2.8 10/31/2020    --------------------------------------------------------------------------------------------------  ASSESSMENT AND PLAN: Coronary artery calcification, aortic atherosclerosis, and hyperlipidemia: 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  I have reviewed his unenhanced CT of the chest performed last week, which shows stable calcifications involving the coronary arteries and visualized aorta.  Most recent lipid panel in March showed excellent LDL of 66, though this was drawn shortly after Mr. Popowski discontinued rosuvastatin.  We will repeat a fasting lipid panel at his convenience now that he has been off statin therapy for at least 4 months.  If LDL has risen  above 100, I would suggest reinitiation of a statin.  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  Follow-up: Return to clinic in 1 year.  Nelva Bush, MD 03/20/2021 2:30 PM

## 2021-03-20 ENCOUNTER — Ambulatory Visit (INDEPENDENT_AMBULATORY_CARE_PROVIDER_SITE_OTHER): Payer: Medicare Other | Admitting: Internal Medicine

## 2021-03-20 ENCOUNTER — Encounter: Payer: Self-pay | Admitting: Internal Medicine

## 2021-03-20 ENCOUNTER — Other Ambulatory Visit: Payer: Self-pay

## 2021-03-20 VITALS — BP 100/74 | HR 63 | Ht 73.0 in | Wt 185.0 lb

## 2021-03-20 DIAGNOSIS — I2584 Coronary atherosclerosis due to calcified coronary lesion: Secondary | ICD-10-CM

## 2021-03-20 DIAGNOSIS — I2510 Atherosclerotic heart disease of native coronary artery without angina pectoris: Secondary | ICD-10-CM

## 2021-03-20 DIAGNOSIS — Z79899 Other long term (current) drug therapy: Secondary | ICD-10-CM

## 2021-03-20 DIAGNOSIS — M19012 Primary osteoarthritis, left shoulder: Secondary | ICD-10-CM | POA: Diagnosis not present

## 2021-03-20 DIAGNOSIS — E7800 Pure hypercholesterolemia, unspecified: Secondary | ICD-10-CM | POA: Diagnosis not present

## 2021-03-20 DIAGNOSIS — I700 Atherosclerosis of aorta: Secondary | ICD-10-CM | POA: Diagnosis not present

## 2021-03-20 DIAGNOSIS — M25512 Pain in left shoulder: Secondary | ICD-10-CM | POA: Diagnosis not present

## 2021-03-20 NOTE — Patient Instructions (Signed)
Medication Instructions:   Your physician recommends that you continue on your current medications as directed. Please refer to the Current Medication list given to you today.  *If you need a refill on your cardiac medications before your next appointment, please call your pharmacy*   Lab Work:  Your physician recommends that you return for FASTING lab work to the [facility] at [facility] for Lipid Panel.   -  Please go to the [facility]. You will check in at the front desk to the right as you walk into the atrium. Valet Parking is offered if needed. - No appointment needed. You may go any day between 7 am and 6 pm.   Testing/Procedures:  None ordered   Follow-Up: At [facility], you and your health needs are our priority.  As part of our continuing mission to provide you with exceptional heart care, we have created designated Provider Care Teams.  These Care Teams include your primary Cardiologist (physician) and Advanced Practice Providers (APPs -  Physician Assistants and Nurse Practitioners) who all work together to provide you with the care you need, when you need it.  We recommend signing up for the patient portal called "MyChart".  Sign up information is provided on this After Visit Summary.  MyChart is used to connect with patients for Virtual Visits (Telemedicine).  Patients are able to view lab/test results, encounter notes, upcoming appointments, etc.  Non-urgent messages can be sent to your provider as well.   To learn more about what you can do with MyChart, go to [email or web address].    Your next appointment:   1 year(s)  The format for your next appointment:   In Person  Provider:   You may see Nelva Bush, MD or one of the following Advanced Practice Providers on your designated Care Team:   Murray Hodgkins, NP Christell Faith, PA-C Marrianne Mood, PA-C Cadence Bellerose, [place]

## 2021-03-21 ENCOUNTER — Other Ambulatory Visit
Admission: RE | Admit: 2021-03-21 | Discharge: 2021-03-21 | Disposition: A | Discharge status: Home / Self Care | Payer: Medicare Other | Source: Ambulatory Visit | Attending: Internal Medicine | Admitting: Internal Medicine

## 2021-03-21 ENCOUNTER — Encounter: Payer: Self-pay | Admitting: Internal Medicine

## 2021-03-21 ENCOUNTER — Telehealth: Payer: Self-pay | Admitting: *Deleted

## 2021-03-21 DIAGNOSIS — E7800 Pure hypercholesterolemia, unspecified: Secondary | ICD-10-CM

## 2021-03-21 DIAGNOSIS — I2584 Coronary atherosclerosis due to calcified coronary lesion: Secondary | ICD-10-CM | POA: Insufficient documentation

## 2021-03-21 DIAGNOSIS — Z79899 Other long term (current) drug therapy: Secondary | ICD-10-CM | POA: Insufficient documentation

## 2021-03-21 DIAGNOSIS — I2510 Atherosclerotic heart disease of native coronary artery without angina pectoris: Secondary | ICD-10-CM | POA: Insufficient documentation

## 2021-03-21 LAB — LIPID PANEL
Cholesterol: 165 mg/dL (ref 0–200)
HDL: 50 mg/dL (ref >40)
LDL Cholesterol: 100 mg/dL — ABNORMAL HIGH (ref 0–99)
Total CHOL/HDL Ratio: 3.3 RATIO
Triglycerides: 74 mg/dL (ref <150)
VLDL: 15 mg/dL (ref 0–40)

## 2021-03-21 NOTE — Telephone Encounter (Signed)
Attempted to call pt to review lab results and provider's recc.  No answer at this time. Lmtcb.

## 2021-03-21 NOTE — Telephone Encounter (Signed)
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  I encourage him to continue with his diet but suggested we consider restarting a low-dose statin.  I think it would be reasonable to try rosuvastatin 5 mg daily, if Dr. Duffy Bruce is willing, with follow-up lipid panel and ALT in 3 months.

## 2021-03-21 NOTE — Telephone Encounter (Signed)
Patient returning call.

## 2021-03-22 MED ORDER — ROSUVASTATIN CALCIUM 5 MG PO TABS: 5.0000 mg | ORAL_TABLET | Freq: Every day | ORAL | 5 refills | Status: DC

## 2021-03-22 NOTE — Telephone Encounter (Signed)
Spoke to pt, notified of lab results and provider's recc.  Pt agrees to start Rosuvastatin 5mg  daily.  Rx sent to pharmacy.  Pt voiced understanding.  He will have fasting lipid panel and ALT at [facility] at [facility] in 3 months.  Mychart reminder message sent to pt per request to remind of lab draw.

## 2021-03-22 NOTE — Telephone Encounter (Signed)
Patient calling to confirm 3 months date would be October. Please assist and confirm

## 2021-03-22 NOTE — Telephone Encounter (Signed)
Spoke to pt. Clarified that he is correct - to have FASTING labs (Lipid panel and ALT) end of October 2022.  MyChart reminder updated with correction.  Pt has no further questions at this time.

## 2021-03-24 ENCOUNTER — Telehealth: Payer: Medicare Other | Admitting: Family

## 2021-03-24 DIAGNOSIS — I2510 Atherosclerotic heart disease of native coronary artery without angina pectoris: Secondary | ICD-10-CM

## 2021-03-24 DIAGNOSIS — I2584 Coronary atherosclerosis due to calcified coronary lesion: Secondary | ICD-10-CM

## 2021-03-24 DIAGNOSIS — U071 COVID-19: Secondary | ICD-10-CM | POA: Diagnosis not present

## 2021-03-24 MED ORDER — BENZONATATE 100 MG PO CAPS: 100.0000 mg | ORAL_CAPSULE | Freq: Three times a day (TID) | ORAL | 0 refills | Status: DC | PRN

## 2021-03-24 MED ORDER — MOLNUPIRAVIR EUA 200MG CAPSULE: 4.0000 | ORAL_CAPSULE | Freq: Two times a day (BID) | ORAL | 0 refills | Status: AC

## 2021-03-24 MED ORDER — ALBUTEROL SULFATE HFA 108 (90 BASE) MCG/ACT IN AERS: 2.0000 | INHALATION_SPRAY | Freq: Four times a day (QID) | RESPIRATORY_TRACT | 0 refills | Status: DC | PRN

## 2021-03-24 NOTE — Progress Notes (Signed)
Virtual Visit Consent   Kevin Terrell, you are scheduled for a virtual visit with a [facility] provider today.     Just as with appointments in the office, your consent must be obtained to participate.  Your consent will be active for this visit and any virtual visit you may have with one of our providers in the next 365 days.     If you have a MyChart account, a copy of this consent can be sent to you electronically.  All virtual visits are billed to your insurance company just like a traditional visit in the office.    As this is a virtual visit, video technology does not allow for your provider to perform a traditional examination.  This may limit your provider's ability to fully assess your condition.  If your provider identifies any concerns that need to be evaluated in person or the need to arrange testing (such as labs, EKG, etc.), we will make arrangements to do so.     Although advances in technology are sophisticated, we cannot ensure that it will always work on either your end or our end.  If the connection with a video visit is poor, the visit may have to be switched to a telephone visit.  With either a video or telephone visit, we are not always able to ensure that we have a secure connection.     I need to obtain your verbal consent now.   Are you willing to proceed with your visit today?    Kevin Terrell has provided verbal consent on 03/24/2021 for a virtual visit (video or telephone).   Evelina Dun, FNP   Date: 03/24/2021 11:39 AM   Virtual Visit via Video Note   I, Evelina Dun, connected with  Kevin Terrell  ([id number], 79-Oct-1942) on 03/24/21 at 11:30 AM EDT by a video-enabled telemedicine application and verified that I am speaking with the correct person using two identifiers.  Location: Patient: Virtual Visit Location Patient: Home Provider: Virtual Visit Location Provider: Home   I discussed the limitations of evaluation and management by telemedicine  and the availability of in person appointments. The patient expressed understanding and agreed to proceed.    History of Present Illness: Kevin Terrell is a 80 y.o. who identifies as a male who was assigned male at birth, and is being seen today for COVID positive today. He reports his symptoms started yesterday.   HPI: Cough This is a new problem. The current episode started yesterday. The problem has been waxing and waning. The problem occurs every few minutes. The cough is Non-productive. Associated symptoms include headaches, nasal congestion and postnasal drip. Pertinent negatives include no chills, ear congestion, ear pain, fever, myalgias, shortness of breath or wheezing. He has tried rest (tylenol) for the symptoms. 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   Seasonal allergies 05/18/2018   Hypothyroidism 05/18/2018   Gastroesophageal reflux disease without esophagitis 05/18/2018   History of lobectomy of lung 05/18/2018   Low serum vitamin B12 10/07/2016  Allergies:  Allergies  Allergen Reactions   Compazine [Prochlorperazine Edisylate]     muscle spasmus    Other     [place] - hives   Medications:  Current Outpatient Medications:    albuterol (VENTOLIN HFA) 108 (90 Base) MCG/ACT inhaler, Inhale 2 puffs into the lungs every 6 (six) hours as needed for wheezing or shortness of breath., Disp: 8 g, Rfl: 0   benzonatate (TESSALON PERLES) 100 MG capsule, Take 1 capsule (100  mg total) by mouth 3 (three) times daily as needed., Disp: 20 capsule, Rfl: 0   molnupiravir EUA 200 mg CAPS, Take 4 capsules (800 mg total) by mouth 2 (two) times daily for 5 days., Disp: 40 capsule, Rfl: 0   cetirizine (ZYRTEC) 10 MG tablet, Take 10 mg by mouth daily., Disp: , Rfl:    cholecalciferol (VITAMIN D3) 25 MCG (1000 UT) tablet, Take 1,000 Units by mouth daily., Disp: , Rfl:    diclofenac Sodium (VOLTAREN) 1 % GEL, Apply 4gm to both knees 4 times daily as needed., Disp: 100 g, Rfl: 11   fluticasone (FLONASE) 50 MCG/ACT nasal spray, Place 1 spray into both nostrils 2 (two) times daily., Disp: , Rfl:    gabapentin (NEURONTIN) 300 MG capsule, TAKE 1 CAPSULE BY MOUTH TWICE DAILY, Disp: 180 capsule, Rfl: 0   levothyroxine (SYNTHROID) 50 MCG tablet, TAKE 1 TABLET BY MOUTH ONCE A DAY BEFOREBREAKFAST., Disp: 90 tablet, Rfl: 3   meloxicam (MOBIC) 15 MG tablet, Take 1 tablet (15 mg total) by mouth daily. As needed, Disp: 90 tablet, Rfl: 3   montelukast (SINGULAIR) 10 MG tablet, TAKE 1 TABLET BY MOUTH EVERY NIGHT AT BEDTIME, Disp: 90 tablet, Rfl: 3   Multiple Vitamin (MULTIVITAMIN WITH MINERALS) TABS tablet, Take 1 tablet by mouth daily., Disp: , Rfl:    mupirocin ointment (BACTROBAN) 2 %, Apply 1 application topically 3 (three) times daily., Disp: 22 g, Rfl: 0   Probiotic Product (PROBIOTIC PO), Take 1 capsule by mouth 2 (two) times daily., Disp: , Rfl:    rosuvastatin (CRESTOR) 5 MG tablet, Take 1 tablet (5 mg total) by mouth daily., Disp: 30 tablet, Rfl: 5  Observations/Objective: Patient is well-developed, well-nourished in no acute distress.  Resting comfortably on his porch  at home.  Head is normocephalic, atraumatic.  No labored breathing. Intermittent cough Speech is clear and coherent with logical content.  Patient is alert and oriented at baseline.    Assessment and Plan: 1. COVID-19 virus detected - molnupiravir EUA 200 mg CAPS; Take 4 capsules (800 mg total) by mouth 2 (two)  times daily for 5 days.  Dispense: 40 capsule; Refill: 0 - benzonatate (TESSALON PERLES) 100 MG capsule; Take 1 capsule (100 mg total) by mouth 3 (three) times daily as needed.  Dispense: 20 capsule; Refill: 0 - albuterol (VENTOLIN HFA) 108 (90 Base) MCG/ACT inhaler; Inhale 2 puffs into the lungs every 6 (six) hours as needed for wheezing or shortness of breath.  Dispense: 8 g; Refill: 0 COVID positive, rest, force fluids, tylenol as needed, Quarantine for at least 5 days and fever free, report any worsening symptoms such as increased shortness of breath, swelling, or continued high fevers.  Possible adverse effects discussed   Follow Up Instructions: I discussed the assessment and treatment plan with the patient. The patient was provided an opportunity to ask questions and all were answered. The patient agreed with the plan and demonstrated an understanding of the instructions.  A copy of instructions were sent to the  patient via [place].  The patient was advised to call back or seek an in-person evaluation if the symptoms worsen or if the condition fails to improve as anticipated.  Time:  I spent 7 minutes with the patient via telehealth technology discussing the above problems/concerns.    Evelina Dun, FNP

## 2021-03-25 ENCOUNTER — Telehealth: Payer: Self-pay

## 2021-03-25 ENCOUNTER — Telehealth: Payer: Self-pay | Admitting: *Deleted

## 2021-03-25 NOTE — Telephone Encounter (Signed)
Pt had VV this weekend and was prescribed molnupiravir, Tessalon pearls and albuterol inhaler.

## 2021-03-25 NOTE — Telephone Encounter (Signed)
Scheduler got the cell that pt was positive for covid. Spoke to [employer] and she said will need to move out appts for scan and md for 2  weeks. Scheduler will call pt back after appts rescheduled

## 2021-03-25 NOTE — Telephone Encounter (Signed)
[facility] Night - Client TELEPHONE ADVICE RECORD [facility] Patient Name: Kevin Terrell DR. Leotis Pain Gender: Male DOB: 04/27/1941 Age: 80 Y 9 M 10 D Return Phone Number: [phone] (Primary), [phone] (Secondary) Address: City/ State/ Zip: [place]  [address] Client [facility] Night - Client Client Site [name] Physician Viviana Simpler- MD Contact Type Call Who Is Calling Patient / Member / Family / Caregiver Call Type Triage / Clinical Relationship To Patient Self Return Phone Number [phone] (Primary) Chief Complaint Cough Reason for Call Symptomatic / Request for [place] states he tested positive for Covid this morning and is requesting Paxlovid. He is experiencing sore throat and cough. Translation No Nurse Assessment Nurse: Windle Guard, RN, Lesa Date/Time (Eastern Time): 03/24/2021 9:33:51 AM Confirm and document reason for call. If symptomatic, describe symptoms. ---Caller states he has a sore throat and cough. He has tested positive for Covid Does the patient have any new or worsening symptoms? ---Yes Will a triage be completed? ---Yes Related visit to physician within the last 2 weeks? ---No Does the PT have any chronic conditions? (i.e. diabetes, asthma, this includes High risk factors for pregnancy, etc.) ---Yes List chronic conditions. ---hx of lung CA Is this a behavioral health or substance abuse call? ---No Guidelines Guideline Title Affirmed Question Affirmed Notes Nurse Date/Time (Eastern Time) COVID-19 - Diagnosed or Suspected [1] HIGH RISK for severe COVID complications (e.g., weak immune system, age > 17 years, obesity with BMI > 82, pregnant, chronic lung disease or other chronic medical condition) AND [2] Conner, RN, Lesa 03/24/2021 9:35:07 AM PLEASE NOTE: All timestamps contained within this report are  represented as [place] Standard Time. CONFIDENTIALTY NOTICE: This fax transmission is intended only for the addressee. It contains information that is legally privileged, confidential or otherwise protected from use or disclosure. If you are not the intended recipient, you are strictly prohibited from reviewing, disclosing, copying using or disseminating any of this information or taking any action in reliance on or regarding this information. If you have received this fax in error, please notify us immediately by telephone so that we can arrange for its return to [place]. Phone: [phone], Toll-Free: [phone], Fax: [phone] Page: 2 of 3 Call Id: [id number] Guidelines Guideline Title Affirmed Question Affirmed Notes Nurse Date/Time Eilene Ghazi Time) COVID symptoms (e.g., cough, fever) (Exceptions: Already seen by PCP and no new or worsening symptoms.) Disp. Time Eilene Ghazi Time) Disposition Final User 03/24/2021 9:40:53 AM See HCP within 4 Hours (or PCP triage) Yes Conner, RN, Lesa Disposition Overriden: Call PCP Now Override Reason: Patient's symptoms need a higher level of care Caller Disagree/Comply Comply Caller Understands Yes PreDisposition Did not know what to do Care Advice Given Per Guideline SEE HCP (OR PCP TRIAGE) WITHIN 4 HOURS: * IF OFFICE WILL BE CLOSED AND NO PCP (PRIMARY CARE PROVIDER) SECOND-LEVEL TRIAGE: You need to be seen within the next 3 or 4 hours. A nearby Urgent Care Center [facility]) is often a good source of care. Another choice is to go to the ED. Go sooner if you become worse. GENERAL CARE ADVICE FOR COVID-19 SYMPTOMS: * The symptoms are generally treated the same whether you have COVID-19, influenza or some other respiratory virus. * Cough: Use cough drops. * Feeling dehydrated: Drink extra liquids. If the air in your home is dry, use a humidifier. * Sore throat: Try throat lozenges, hard candy or warm chicken broth. CALL BACK IF: *  You become worse CARE ADVICE  given per COVID-19 - DIAGNOSED OR SUSPECTED (Adult) guideline. COVID-19 - HOW TO PROTECT OTHERS - WHEN YOU ARE SICK WITH COVID-19: * STAY HOME A MINIMUM OF 5 DAYS: Home isolation is needed for at least 5 days after the symptoms started. Stay home from school or work if you are sick. Do NOT go to religious services, child care centers, shopping, or other public places. Do NOT use public transportation (e.g., bus, taxis, ride-sharing). Do NOT allow any visitors to your home. Leave the house only if you need to seek urgent medical care. * WEAR A MASK FOR 10 DAYS: Wear a well-fitted mask for 10 full days any time you are around others inside your home or in public. Do not go to places where you are unable to wear a mask. * [place] HANDS OFTEN: Wash hands often with soap and water. After coughing or sneezing are important times. If soap and water are not available, use an alcohol-based hand sanitizer with at least 60% alcohol, covering all surfaces of your hands and rubbing them together until they feel dry. Avoid touching your eyes, nose, and mouth with unwashed hands. FEVER MEDICINES - EXTRA NOTES AND WARNINGS: * Use the lowest amount of medicine that makes your fever better. * Before taking any medicine, read all the instructions on the package. COUGH SYRUP WITH DEXTROMETHORPHAN - EXTRA NOTES AND WARNINGS: * Do not try to completely stop coughs that produce mucus and phlegm. * Coughing is helpful. It brings up the mucus from the lungs and helps prevent pneumonia. * ACETAMINOPHEN REGULAR STRENGTH TYLENOL: Take 650 mg (two 325 mg pills) by mouth every 4-6 hours as needed. Each Regular Strength Tylenol pill has 325 mg of acetaminophen. The most you should take each day is 3,250 mg (10 pills a day). * IBUPROFEN (E.G., MOTRIN, ADVIL): Take 400 mg (two 200 mg pills) by mouth every 6 hours. The most you should take each day is 1,200 mg (six 200 mg pills), unless your doctor has told you to take more. * For  fevers above 101 F (38.3 C) take either acetaminophen or ibuprofen. FEVER MEDICINES:

## 2021-03-26 ENCOUNTER — Telehealth: Payer: Self-pay | Admitting: Internal Medicine

## 2021-03-26 NOTE — Telephone Encounter (Signed)
Pt c/o medication issue:  1. Name of Medication: rosuvastatin   2. How are you currently taking this medication (dosage and times per day)? 5 mg po q d   3. Are you having a reaction (difficulty breathing--STAT)? no  4. What is your medication issue? Wants to know best or recommended time of day or night to take this

## 2021-03-26 NOTE — Telephone Encounter (Signed)
Spoke to pt. Confirmed that he may take Rosuvastatin with or without food at any time of day.  Advised he does take approximately the same time each day.  Pt appreciative for call and has no further questions.

## 2021-03-28 ENCOUNTER — Inpatient Hospital Stay: Payer: Medicare Other

## 2021-03-29 ENCOUNTER — Ambulatory Visit: Payer: Medicare Other

## 2021-04-01 DIAGNOSIS — Z20822 Contact with and (suspected) exposure to covid-19: Secondary | ICD-10-CM | POA: Diagnosis not present

## 2021-04-02 ENCOUNTER — Ambulatory Visit: Payer: Medicare Other | Admitting: Oncology

## 2021-04-02 ENCOUNTER — Telehealth: Payer: Self-pay | Admitting: Internal Medicine

## 2021-04-02 MED ORDER — ROSUVASTATIN CALCIUM 5 MG PO TABS: 5.0000 mg | ORAL_TABLET | Freq: Every day | ORAL | 1 refills | Status: DC

## 2021-04-02 NOTE — Telephone Encounter (Signed)
Requested Prescriptions   Signed Prescriptions Disp Refills   rosuvastatin (CRESTOR) 5 MG tablet 90 tablet 1    Sig: Take 1 tablet (5 mg total) by mouth daily.    Authorizing Provider: END, CHRISTOPHER    Ordering User: Raelene Bott, Colon Rueth L

## 2021-04-02 NOTE — Telephone Encounter (Signed)
*  STAT* If patient is at the pharmacy, call can be transferred to refill team.   1. Which medications need to be refilled? (please list name of each medication and dose if known)   Rosuvastatin 5 mg po q d  2. Which pharmacy/location (including street and city if local pharmacy) is medication to be sent to?   [place]   3. Do they need a 30 day or 90 day supply? Please change recent refill to 90 day for pill packaging

## 2021-04-03 DIAGNOSIS — M19012 Primary osteoarthritis, left shoulder: Secondary | ICD-10-CM | POA: Diagnosis not present

## 2021-04-03 DIAGNOSIS — M25512 Pain in left shoulder: Secondary | ICD-10-CM | POA: Diagnosis not present

## 2021-04-05 DIAGNOSIS — M7522 Bicipital tendinitis, left shoulder: Secondary | ICD-10-CM | POA: Diagnosis not present

## 2021-04-05 DIAGNOSIS — M67922 Unspecified disorder of synovium and tendon, left upper arm: Secondary | ICD-10-CM | POA: Diagnosis not present

## 2021-04-05 DIAGNOSIS — M25512 Pain in left shoulder: Secondary | ICD-10-CM | POA: Diagnosis not present

## 2021-04-05 DIAGNOSIS — G8929 Other chronic pain: Secondary | ICD-10-CM | POA: Diagnosis not present

## 2021-04-09 ENCOUNTER — Other Ambulatory Visit: Payer: Medicare Other

## 2021-04-10 ENCOUNTER — Ambulatory Visit: Payer: Medicare Other

## 2021-04-12 ENCOUNTER — Ambulatory Visit: Payer: Medicare Other | Admitting: Oncology

## 2021-04-15 ENCOUNTER — Inpatient Hospital Stay: Payer: Medicare Other | Attending: Oncology

## 2021-04-15 DIAGNOSIS — C3490 Malignant neoplasm of unspecified part of unspecified bronchus or lung: Secondary | ICD-10-CM

## 2021-04-15 DIAGNOSIS — Z85118 Personal history of other malignant neoplasm of bronchus and lung: Secondary | ICD-10-CM | POA: Insufficient documentation

## 2021-04-15 DIAGNOSIS — Z9221 Personal history of antineoplastic chemotherapy: Secondary | ICD-10-CM | POA: Diagnosis not present

## 2021-04-15 DIAGNOSIS — Z902 Acquired absence of lung [part of]: Secondary | ICD-10-CM | POA: Insufficient documentation

## 2021-04-15 DIAGNOSIS — Z79899 Other long term (current) drug therapy: Secondary | ICD-10-CM | POA: Diagnosis not present

## 2021-04-15 LAB — CBC WITH DIFFERENTIAL/PLATELET
Abs Immature Granulocytes: 0.01 10*3/uL (ref 0.00–0.07)
Basophils Absolute: 0 10*3/uL (ref 0.0–0.1)
Basophils Relative: 0 %
Eosinophils Absolute: 0.2 10*3/uL (ref 0.0–0.5)
Eosinophils Relative: 5 %
HCT: 39.9 % (ref 39.0–52.0)
Hemoglobin: 12.8 g/dL — ABNORMAL LOW (ref 13.0–17.0)
Immature Granulocytes: 0 %
Lymphocytes Relative: 34 %
Lymphs Abs: 1.8 10*3/uL (ref 0.7–4.0)
MCH: 30 pg (ref 26.0–34.0)
MCHC: 32.1 g/dL (ref 30.0–36.0)
MCV: 93.7 fL (ref 80.0–100.0)
Monocytes Absolute: 0.7 10*3/uL (ref 0.1–1.0)
Monocytes Relative: 14 %
Neutro Abs: 2.5 10*3/uL (ref 1.7–7.7)
Neutrophils Relative %: 47 %
Platelets: 175 10*3/uL (ref 150–400)
RBC: 4.26 MIL/uL (ref 4.22–5.81)
RDW: 13.2 % (ref 11.5–15.5)
WBC: 5.3 10*3/uL (ref 4.0–10.5)
nRBC: 0 % (ref 0.0–0.2)

## 2021-04-15 LAB — COMPREHENSIVE METABOLIC PANEL
ALT: 18 U/L (ref 0–44)
AST: 21 U/L (ref 15–41)
Albumin: 4.1 g/dL (ref 3.5–5.0)
Alkaline Phosphatase: 55 U/L (ref 38–126)
Anion gap: 6 (ref 5–15)
BUN: 16 mg/dL (ref 8–23)
CO2: 30 mmol/L (ref 22–32)
Calcium: 8.8 mg/dL — ABNORMAL LOW (ref 8.9–10.3)
Chloride: 101 mmol/L (ref 98–111)
Creatinine, Ser: 0.68 mg/dL (ref 0.61–1.24)
GFR, Estimated: >60 mL/min (ref >60)
Glucose, Bld: 85 mg/dL (ref 70–99)
Potassium: 4 mmol/L (ref 3.5–5.1)
Sodium: 137 mmol/L (ref 135–145)
Total Bilirubin: 0.8 mg/dL (ref 0.3–1.2)
Total Protein: 6.8 g/dL (ref 6.5–8.1)

## 2021-04-17 ENCOUNTER — Ambulatory Visit
Admission: RE | Admit: 2021-04-17 | Discharge: 2021-04-17 | Disposition: A | Discharge status: Home / Self Care | Payer: Medicare Other | Source: Ambulatory Visit | Attending: Oncology | Admitting: Oncology

## 2021-04-17 ENCOUNTER — Other Ambulatory Visit: Payer: Self-pay

## 2021-04-17 DIAGNOSIS — K7689 Other specified diseases of liver: Secondary | ICD-10-CM | POA: Diagnosis not present

## 2021-04-17 DIAGNOSIS — R935 Abnormal findings on diagnostic imaging of other abdominal regions, including retroperitoneum: Secondary | ICD-10-CM | POA: Diagnosis not present

## 2021-04-17 DIAGNOSIS — R188 Other ascites: Secondary | ICD-10-CM | POA: Diagnosis not present

## 2021-04-17 DIAGNOSIS — I700 Atherosclerosis of aorta: Secondary | ICD-10-CM | POA: Diagnosis not present

## 2021-04-17 DIAGNOSIS — D1803 Hemangioma of intra-abdominal structures: Secondary | ICD-10-CM | POA: Diagnosis not present

## 2021-04-17 IMAGING — CT CT ABD-PELV W/ CM
2 of 5 series · 14 of 46 positions shown, 16 images · IV contrast (omnipaque)
Comparison: Multiple exams, including MRI abdomen [DATE] and CT
abdomen [DATE]

CLINICAL DATA: Nodal prominence in stranding in the retroperitoneum
on prior CT, for further characterization.

EXAM:
CT ABDOMEN AND PELVIS WITH CONTRAST
TECHNIQUE: Multidetector CT imaging of the abdomen and pelvis was performed
using the standard protocol following bolus administration of
intravenous contrast.
CONTRAST:  85mL OMNIPAQUE IOHEXOL 350 MG/ML SOLN

[Series 2: abd pelvis 5.00 · axial · 0.72mm/px · z∈[-1580,-1135]mm · 11 of 101 slices shown, 13 images]
[im 6/101  soft-tissue]
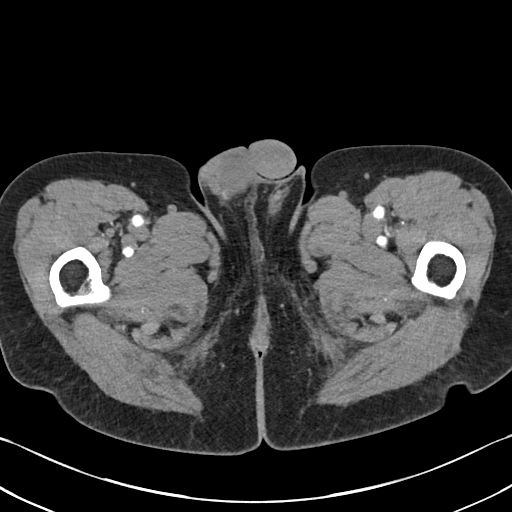
[im 6/101  bone]
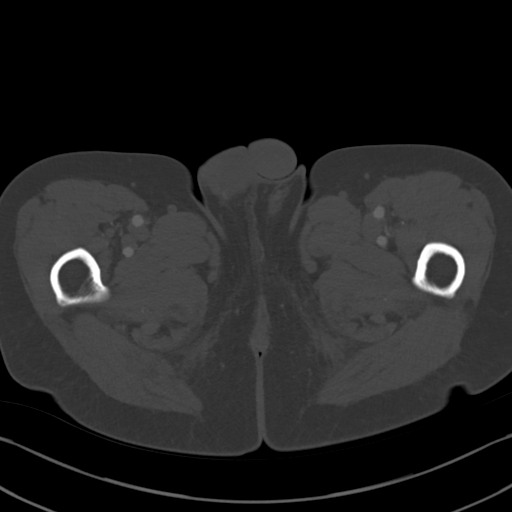
[im 17/101  soft-tissue]
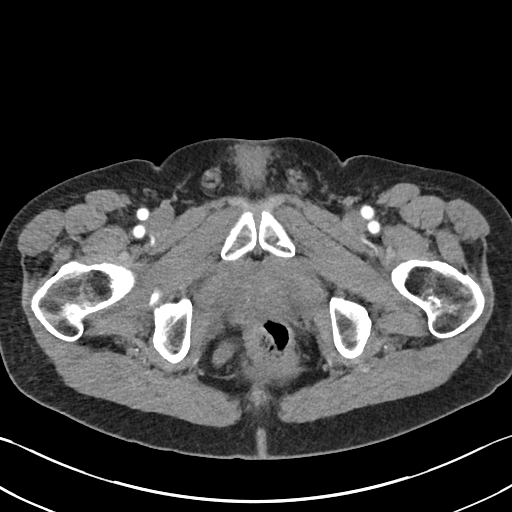
[im 23/101  soft-tissue]
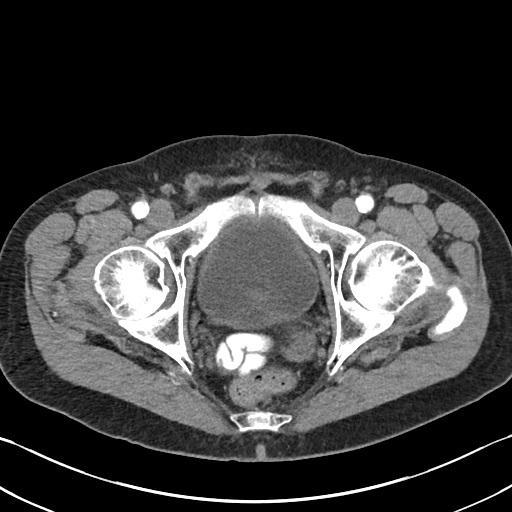
[im 34/101  soft-tissue]
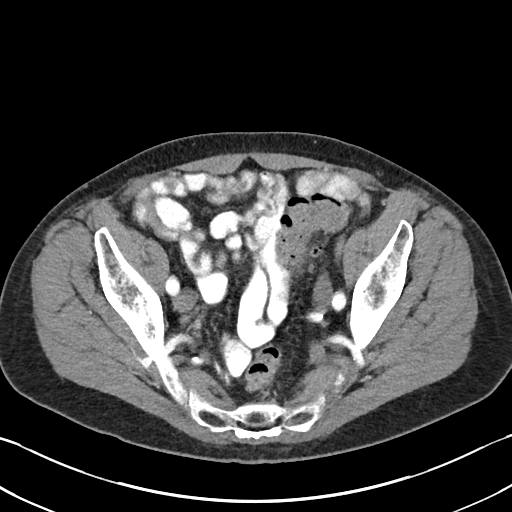
[im 39/101  soft-tissue]
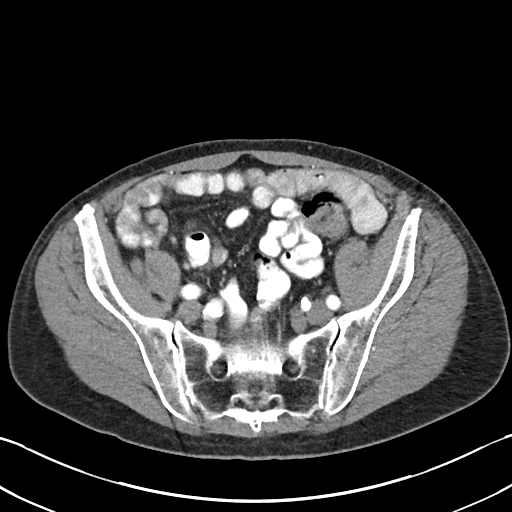
[im 51/101  soft-tissue]
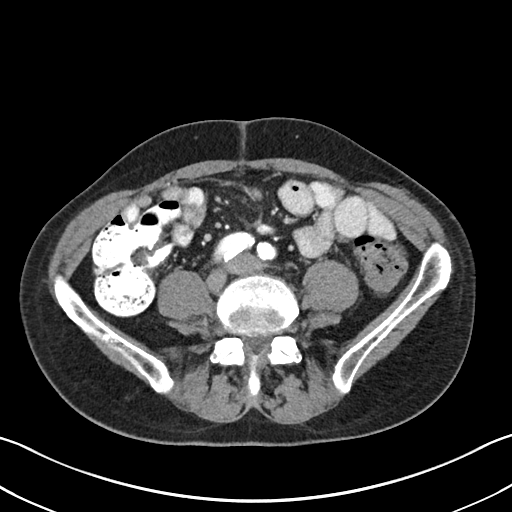
[im 62/101  soft-tissue]
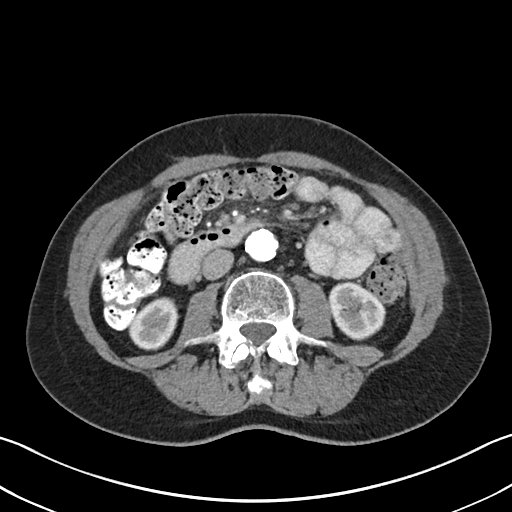
[im 67/101  soft-tissue]
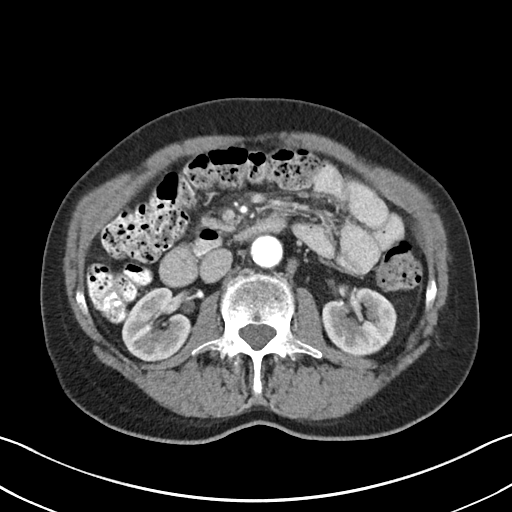
[im 78/101  soft-tissue]
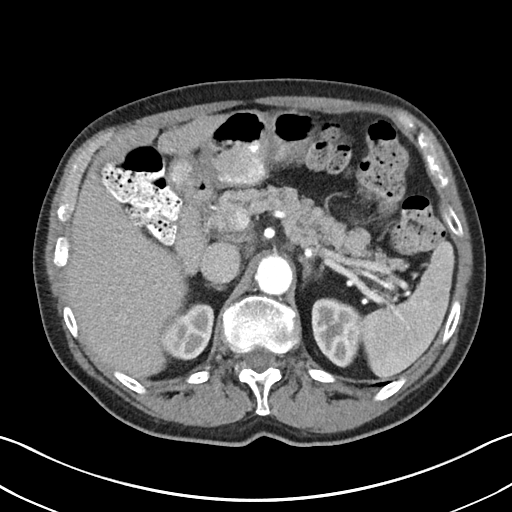
[im 78/101  bone]
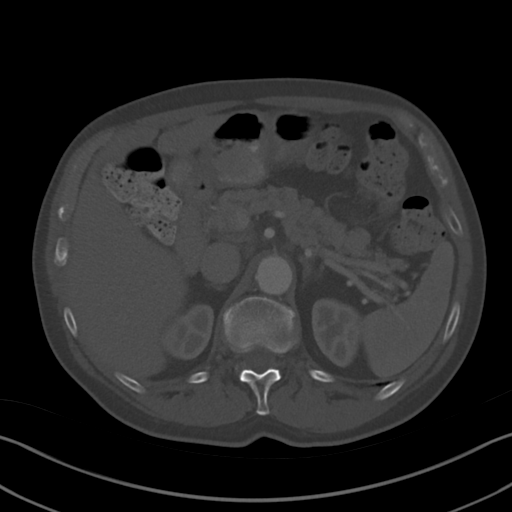
[im 84/101  soft-tissue]
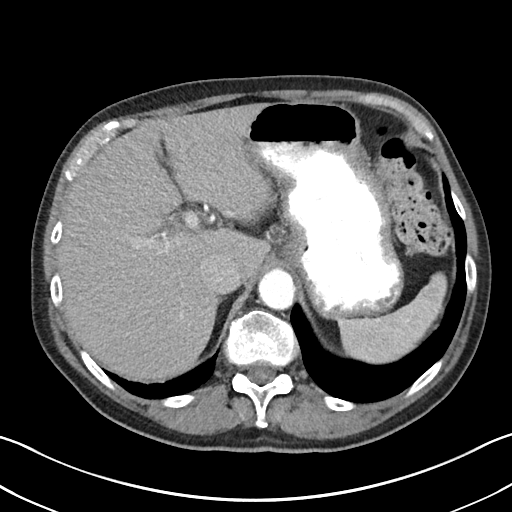
[im 95/101  soft-tissue]
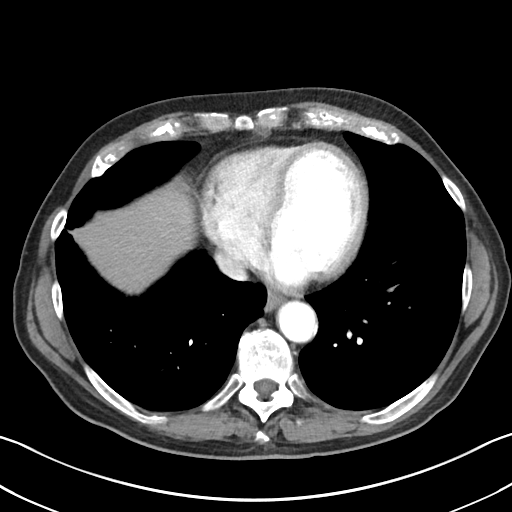

[Series 4: coronals abd pelvis 2.00 cor · coronal · 0.73mm/px · 3 of 142 slices shown]
[im 48/142  soft-tissue]
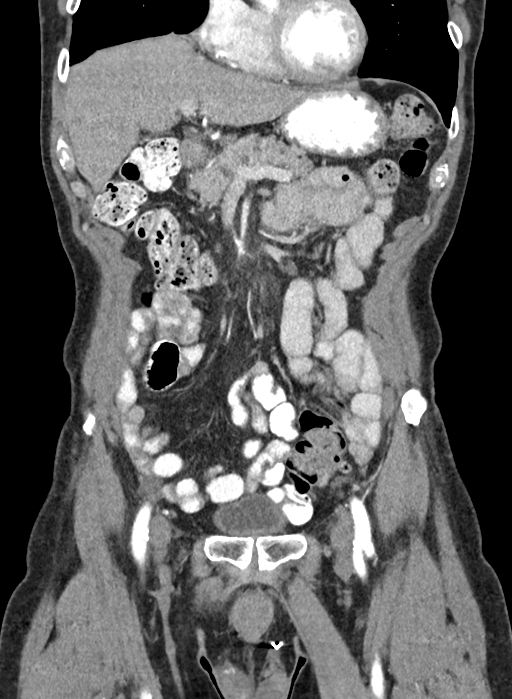
[im 63/142  soft-tissue]
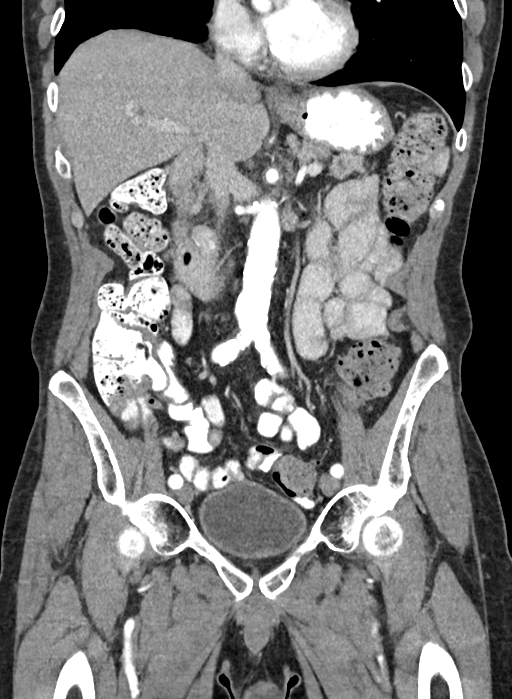
[im 79/142  soft-tissue]
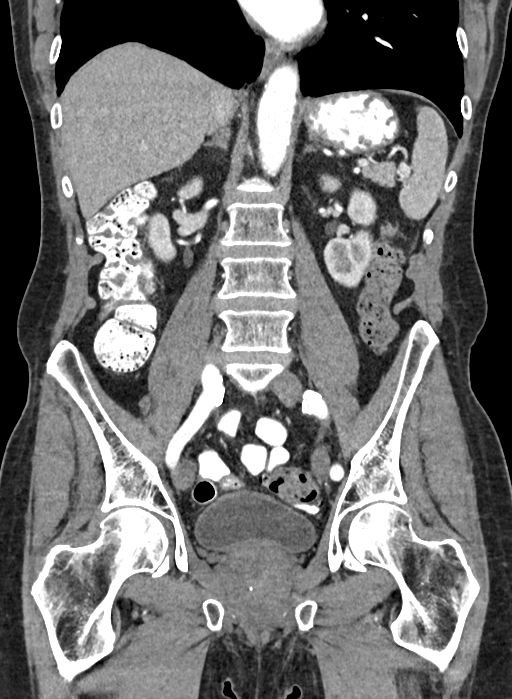

[14 of 46 positions shown; findings below may reference images not displayed]

FINDINGS: Lower chest: Right coronary artery and descending thoracic aortic
atherosclerotic calcification. Suspected mild scarring anteriorly in
the right lower lobe laterally on image 5 series 3.

Hepatobiliary: 2.5 by 1.8 cm hypodense lesion previously shown to be
a cavernous hemangioma in the dome of the right hepatic lobe, image
12 series 2, stable.

Lateral segment left hepatic lobe cyst, 1.6 by 1.0 cm on image 13
series 2, stable. 0.4 cm cyst in segment 3 of the liver, image 20
series 2, stable.

The gallbladder is absent. No biliary dilatation. No significant new
lesion is identified.

Pancreas: The previous extensive peripancreatic fluid has cleared,
with substantially improved and fairly faint stranding tracking
around the pancreatic head, uncinate process, and along the central
mesentery. However, this represents an extensive improvement
compared to [DATE]. No current findings of pancreatic mass,
abscess, or pseudocyst.

Spleen: Unremarkable

Adrenals/Urinary Tract: Unremarkable

Stomach/Bowel: Scattered sigmoid colon diverticula. Prominent stool
throughout the colon favors constipation. Normal appendix.

Vascular/Lymphatic: Aortoiliac atherosclerotic vascular disease.
Interval resolution of the periaortic/retroperitoneal stranding. No
current retroperitoneal adenopathy, prior lymph nodes have
substantially reduced in size and currently appear normal.

Reproductive: Vasectomy clips noted.  Otherwise unremarkable.

Other: Previous ascites has resolved.

Musculoskeletal: Healing fractures of the right anterior sixth,
seventh, eighth, and ninth ribs.

The patient relates a history of bilateral groin pain and prior
inguinal hernia repairs in [PV]. Expected low-density postoperative
findings in the inguinal region characteristic of prior
herniorrhaphy, without specific abnormality identified to explain
the groin pain.
IMPRESSION: 1. The prior retroperitoneal stranding and scattered lymph nodes
have cleared, and more likely from congestion or third spacing of
fluid. Similarly the ascites, pleural effusions, and the vast
majority of the mesenteric and pelvic edema has cleared.
2. There is still some trace peripancreatic stranding around the
pancreatic head and tracking in the central mesentery. This is
substantially improved from [DATE] and may reside are present
residua from prior pancreatitis. Strictly speaking, sclerosing
mesenteritis is not completely excluded although seems a less likely
explanation in light of the recent pancreatitis. I would suggest
correlation with lipase levels.
3. Currently no findings of pancreatic abscess, necrosis, or
pseudocyst.
4. Old hemangioma in the dome of the right hepatic lobe. Small
hepatic cysts. These appear stable.
5. Characteristic findings of bilateral inguinal herniorrhaphy,
without specific abnormality in the groin region to explain the
patient's groin pain.
6.  Prominent stool throughout the colon favors constipation.
7.  Aortic Atherosclerosis ([PV]-[PV]).
8. Healing fractures of the right anterior sixth, seventh, eighth,
and ninth ribs. These fractures were present on [DATE] but not
on [DATE].

## 2021-04-17 MED ORDER — IOHEXOL 350 MG/ML SOLN
85.0000 mL | Freq: Once | INTRAVENOUS | Status: AC | PRN
  Administered 2021-04-17: 85 mL via INTRAVENOUS

## 2021-04-19 ENCOUNTER — Encounter: Payer: Self-pay | Admitting: Oncology

## 2021-04-19 ENCOUNTER — Inpatient Hospital Stay (HOSPITAL_BASED_OUTPATIENT_CLINIC_OR_DEPARTMENT_OTHER): Payer: Medicare Other | Admitting: Oncology

## 2021-04-19 VITALS — BP 120/72 | HR 61 | Temp 97.1°F | Resp 18 | Wt 187.1 lb

## 2021-04-19 DIAGNOSIS — C3490 Malignant neoplasm of unspecified part of unspecified bronchus or lung: Secondary | ICD-10-CM

## 2021-04-19 DIAGNOSIS — R935 Abnormal findings on diagnostic imaging of other abdominal regions, including retroperitoneum: Secondary | ICD-10-CM

## 2021-04-20 NOTE — Progress Notes (Signed)
I connected with Kevin Terrell on 04/20/21 at 10:15 AM EDT by video enabled telemedicine visit and verified that I am speaking with the correct person using two identifiers.   I discussed the limitations, risks, security and privacy concerns of performing an evaluation and management service by telemedicine and the availability of in-person appointments. I also discussed with the patient that there may be a patient responsible charge related to this service. The patient expressed understanding and agreed to proceed.  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  He is s/p right upper lobectomy and underwent 4 cycles of adjuvant chemotherapy back then.  This is as per patient history and I do not have any oncology records from back then.  Patient states that he required chemotherapy as he was found to have a high mitotic rate in his final pathology but the size of the final tumor was a little over 2 cm with negative nodes.  Patient has been in remission since then and was getting surveillance CT scans.  Last scan was done in 2019 which did not show any evidence of recurrence.    He did not require follow-up subsequently with oncology and he has been getting yearly low-dose screening Cts  patient was admitted for gallstone pancreatitis in March 2022 requiring emergency cholecystectomy.  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  Interval history patient currently reports doing well and denies any specific complaints at this  time.  Appetite and weight have remained stable.  Denies any abdominal pain   ROS  Allergies  Allergen Reactions   Compazine [Prochlorperazine Edisylate]     muscle spasmus    Other     Okra - hives    Past Medical History:  Diagnosis Date   Actinic keratosis    Arthritis    Chronic allergic rhinitis due to pollen    GERD (gastroesophageal reflux disease)    with atypical cough   Hypothyroidism    Lung cancer ([place]) 2009   chemo, NO radiation. 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   UPPER GI ENDOSCOPY      Social History   Socioeconomic History   Marital status: Married    Spouse name: Not on file   Number of children: 3   Years of education: Not on file   Highest education level: Not on file  Occupational History   Occupation: Veterinarian    Comment: retired  Tobacco Use   Smoking status: Never  Smokeless tobacco: Never  Vaping Use   Vaping Use: Never used  Substance and Sexual Activity   Alcohol use: Yes    Comment: Once glass of wine per month, none last 24hrs   Drug use: Never   Sexual activity: Yes  Other Topics Concern   Not on file  Social History Narrative   Married   Animal nutritionist   Wife is nurse    Has a daughter in [place]; son and daughter in [place]    6 grandchildren   [place] from [place]; spent some time in [place]; moved here 2019.    Lives at [facility]      Has advanced directives   Daughter [name] is health care POA    Would accept resuscitation attempts   No feeding tube if cognitively unaware   Social Determinants of Health   Financial Resource Strain: Not on file  Food Insecurity: Not on file  Transportation Needs: Not on file  Physical Activity: Not on file  Stress: Not on file  Social Connections: Not on file  Intimate Partner Violence: Not on file    Family History  Problem Relation Age of Onset   Cancer Mother    Alzheimer's disease Mother    Stroke Maternal Grandmother    Hypertension Father    Alzheimer's disease Father      Current Outpatient Medications:    albuterol (VENTOLIN HFA) 108 (90 Base) MCG/ACT inhaler, Inhale 2 puffs into the lungs every 6 (six) hours as needed for wheezing or shortness of breath., Disp: 8 g, Rfl: 0   benzonatate (TESSALON PERLES) 100 MG capsule, Take 1 capsule (100 mg total) by mouth 3 (three) times daily as needed., Disp: 20 capsule, Rfl: 0   cetirizine (ZYRTEC) 10 MG tablet, Take 10 mg by mouth daily., Disp: , Rfl:    cholecalciferol (VITAMIN D3) 25 MCG (1000 UT) tablet, Take 1,000 Units by mouth daily., Disp: , Rfl:    diclofenac Sodium (VOLTAREN) 1 % GEL, Apply 4gm to both knees 4 times daily as needed., Disp: 100 g, Rfl: 11   fluticasone (FLONASE) 50 MCG/ACT nasal spray, Place 1 spray into both nostrils 2 (two) times daily., Disp: , Rfl:    gabapentin (NEURONTIN) 300 MG capsule, TAKE 1 CAPSULE BY MOUTH TWICE DAILY, Disp: 180 capsule, Rfl: 0   levothyroxine (SYNTHROID) 50 MCG tablet, TAKE 1 TABLET BY MOUTH ONCE A DAY BEFOREBREAKFAST., Disp: 90 tablet, Rfl: 3   meloxicam (MOBIC) 15 MG tablet, Take 1 tablet (15 mg total) by mouth daily. As needed, Disp: 90 tablet, Rfl: 3   montelukast (SINGULAIR) 10 MG tablet, TAKE 1 TABLET BY MOUTH EVERY NIGHT AT BEDTIME, Disp: 90 tablet, Rfl: 3   Multiple Vitamin (MULTIVITAMIN WITH MINERALS) TABS tablet, Take 1 tablet by mouth daily., Disp: , Rfl:    mupirocin ointment (BACTROBAN) 2 %, Apply 1 application topically 3  (three) times daily., Disp: 22 g, Rfl: 0   Probiotic Product (PROBIOTIC PO), Take 1 capsule by mouth 2 (two) times daily., Disp: , Rfl:    rosuvastatin (CRESTOR) 5 MG tablet, Take 1 tablet (5 mg total) by mouth daily., Disp: 90 tablet, Rfl: 1  CT Abdomen Pelvis W Contrast  Result Date: 04/17/2021 CLINICAL DATA:  Nodal prominence in stranding in the retroperitoneum on prior CT, for further characterization. EXAM: CT ABDOMEN AND PELVIS WITH CONTRAST TECHNIQUE: Multidetector CT imaging of the abdomen and pelvis was performed using the standard protocol following bolus administration of intravenous contrast. CONTRAST:  52mL OMNIPAQUE IOHEXOL 350  MG/ML SOLN COMPARISON:  Multiple exams, including MRI abdomen 11/01/2020 and CT abdomen 11/02/2020 FINDINGS: Lower chest: Right coronary artery and descending thoracic aortic atherosclerotic calcification. Suspected mild scarring anteriorly in the right lower lobe laterally on image 5 series 3. 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 Lateral segment left hepatic lobe cyst, 1.6 by 1.0 cm on image 13 series 2, stable. 0.4 cm cyst in segment 3 of the liver, image 20 series 2, stable. The gallbladder is absent. No biliary dilatation. No significant new lesion is identified. Pancreas: The previous extensive peripancreatic fluid has cleared, with substantially improved and fairly faint stranding tracking around the pancreatic head, uncinate process, and along the central mesentery. However, this represents an extensive improvement compared to 11/02/2020. No current findings of pancreatic mass, abscess, or pseudocyst. Spleen: Unremarkable Adrenals/Urinary Tract: Unremarkable Stomach/Bowel: Scattered sigmoid colon diverticula. Prominent stool throughout the colon favors constipation. Normal appendix. Vascular/Lymphatic: Aortoiliac atherosclerotic vascular disease.  Interval resolution of the periaortic/retroperitoneal stranding. No current retroperitoneal adenopathy, prior lymph nodes have substantially reduced in size and currently appear normal. Reproductive: Vasectomy clips noted.  Otherwise unremarkable. Other: Previous ascites has resolved. Musculoskeletal: Healing fractures of the right anterior sixth, seventh, eighth, and ninth ribs. The patient relates a history of bilateral groin pain and prior inguinal hernia repairs in 2015. Expected low-density postoperative findings in the inguinal region characteristic of prior herniorrhaphy, without specific abnormality identified to explain the groin pain. IMPRESSION: 1. The prior retroperitoneal stranding and scattered lymph nodes have cleared, and more likely from congestion or third spacing of fluid. Similarly the ascites, pleural effusions, and the vast majority of the mesenteric and pelvic edema has cleared. 2. There is still some trace peripancreatic stranding around the pancreatic head and tracking in the central mesentery. This is substantially improved from 11/02/2020 and may reside are present residua from prior pancreatitis. Strictly speaking, sclerosing mesenteritis is not completely excluded although seems a less likely explanation in light of the recent pancreatitis. I would suggest correlation with lipase levels. 3. Currently no findings of pancreatic abscess, necrosis, or pseudocyst. 4. 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 Small hepatic cysts. These appear stable. 5. Characteristic findings of bilateral inguinal herniorrhaphy, without specific abnormality in the groin region to explain the patient's groin pain. 6.  Prominent stool throughout the colon favors constipation. 7.  Aortic Atherosclerosis (ICD10-I70.0). 8. Healing fractures of the right anterior sixth, seventh, eighth, and ninth ribs. These fractures were present on 03/13/2021 but not on 11/02/2020. Electronically Signed   By: Van Clines M.D.   On: 04/17/2021 12:37    No images are attached to the encounter.   CMP Latest Ref Rng & Units 04/15/2021  Glucose 70 - 99 mg/dL 85  BUN 8 - 23 mg/dL 16  Creatinine 0.61 - 1.24 mg/dL 0.68  Sodium 135 - 145 mmol/L 137  Potassium 3.5 - 5.1 mmol/L 4.0  Chloride 98 - 111 mmol/L 101  CO2 22 - 32 mmol/L 30  Calcium 8.9 - 10.3 mg/dL 8.8(L)  Total Protein 6.5 - 8.1 g/dL 6.8  Total Bilirubin 0.3 - 1.2 mg/dL 0.8  Alkaline Phos 38 - 126 U/L 55  AST 15 - 41 U/L 21  ALT 0 - 44 U/L 18   CBC Latest Ref Rng & Units 04/15/2021  WBC 4.0 - 10.5 K/uL 5.3  Hemoglobin 13.0 - 17.0 g/dL 12.8(L)  Hematocrit 39.0 - 52.0 % 39.9  Platelets 150 - 400 K/uL 175     Observation/objective: Appears in no acute distress over video visit today.  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  This was discussed with radiology subsequently and findings were also considered to be inflammatory.  And therefore a repeat CT abdomen was done in August 2022 which shows stranding in the retroperitoneum and scattered lymph nodes have cleared.  Ascites pleural effusion along with mesenteric and pelvic uremia is also cleared.  Some trace peripancreatic stranding was still noted around the pancreatic head and central mesentery but overall improved.  Sclerosing mesenteritis not excluded. I explained to the patient that diagnosis of sclerosing mesenteritis is difficult to make an overall given the improving findings and CT abdomen and lack of any abdominal pain or other symptoms I would recommend monitoring this with a repeat  CT abdomen 1 more time in 6 months and if overall findings are improving there would be no need for repeat CT scans in the future.  2.  Patient does have a prior history of stage I lung cancer back in 2009 and will need yearly screening CT chest with the next 1 being sometime in July 2023 and I will refer him to lung clinic for this when I see him next time  Follow-up instructions:  I discussed the assessment and treatment plan with the patient. The patient was provided an opportunity to ask questions and all were answered. The patient agreed with the plan and demonstrated an understanding of the instructions.   The patient was advised to call back or seek an in-person evaluation if the symptoms worsen or if the condition fails to improve as anticipated.  Visit Diagnosis: 1. Malignant neoplasm of lung, unspecified laterality, unspecified part of lung ([place])   2. Abnormal CT of the abdomen     Dr. Randa Evens, MD, MPH [facility] Tel- [phone] 04/20/2021 8:38 AM

## 2021-04-22 ENCOUNTER — Encounter: Payer: Self-pay | Admitting: *Deleted

## 2021-04-29 ENCOUNTER — Other Ambulatory Visit: Payer: Self-pay

## 2021-04-29 ENCOUNTER — Encounter: Payer: Self-pay | Admitting: Podiatry

## 2021-04-29 ENCOUNTER — Ambulatory Visit (INDEPENDENT_AMBULATORY_CARE_PROVIDER_SITE_OTHER): Payer: Medicare Other | Admitting: Podiatry

## 2021-04-29 DIAGNOSIS — B351 Tinea unguium: Secondary | ICD-10-CM | POA: Diagnosis not present

## 2021-04-29 DIAGNOSIS — M79676 Pain in unspecified toe(s): Secondary | ICD-10-CM

## 2021-04-29 DIAGNOSIS — I2510 Atherosclerotic heart disease of native coronary artery without angina pectoris: Secondary | ICD-10-CM | POA: Diagnosis not present

## 2021-04-29 DIAGNOSIS — I2584 Coronary atherosclerosis due to calcified coronary lesion: Secondary | ICD-10-CM | POA: Diagnosis not present

## 2021-04-29 NOTE — Progress Notes (Signed)
He presents today for follow-up of his nail fungus he is completed laser treatments.  States that they are doing very well he is very happy with the outcome thus far.  Objective: Vital signs stable alert oriented x3 toenails appear to be greater than 75% grown out.  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  Plan: I encouraged him to have a couple more treatments so that he can completely rid himself of this fungus.  He is looking so much better.  I did trim his nails for him today.

## 2021-04-30 ENCOUNTER — Ambulatory Visit (INDEPENDENT_AMBULATORY_CARE_PROVIDER_SITE_OTHER): Payer: Medicare Other | Admitting: Dermatology

## 2021-04-30 ENCOUNTER — Encounter: Payer: Self-pay | Admitting: Dermatology

## 2021-04-30 DIAGNOSIS — L570 Actinic keratosis: Secondary | ICD-10-CM | POA: Diagnosis not present

## 2021-04-30 DIAGNOSIS — L820 Inflamed seborrheic keratosis: Secondary | ICD-10-CM

## 2021-04-30 NOTE — Progress Notes (Signed)
   Follow-Up Visit   Subjective  Kevin Terrell is a 80 y.o. male who presents for the following: Skin Problem (Patient here today to have spots checked at right lower leg, above right eyebrow that won't go away, right hand and some that are irritated at chest and shoulder area. Spot at right lower leg may be ISK that was treated previously with LN2. ).   The following portions of the chart were reviewed this encounter and updated as appropriate:   Tobacco  Allergies  Meds  Problems  Med Hx  Surg Hx  Fam Hx      Review of Systems:  No other skin or systemic complaints except as noted in HPI or Assessment and Plan.  Objective  Well appearing patient in no apparent distress; mood and affect are within normal limits.  A focused examination was performed including face, chest, shoulders, right leg. Relevant physical exam findings are noted in the Assessment and Plan.  Right dorsal hand x 1, left superior shoulder x 3, right anterior shoulder x 1, mid chest x 1 (6) Erythematous keratotic or waxy stuck-on papule or plaque.   Right medial pretibial x 1, right forehead above brow x 1 (2) Erythematous thin papules/macules with gritty scale.    Assessment & Plan  Inflamed seborrheic keratosis Right dorsal hand x 1, left superior shoulder x 3, right anterior shoulder x 1, mid chest x 1  Prior to procedure, discussed risks of blister formation, small wound, skin dyspigmentation, or rare scar following cryotherapy. Recommend Vaseline ointment to treated areas while healing.   Destruction of lesion - Right dorsal hand x 1, left superior shoulder x 3, right anterior shoulder x 1, mid chest x 1  Destruction method: cryotherapy   Informed consent: discussed and consent obtained   Lesion destroyed using liquid nitrogen: Yes   Cryotherapy cycles:  2 Outcome: patient tolerated procedure well with no complications   Post-procedure details: wound care instructions given    AK (actinic  keratosis) (2) Right medial pretibial x 1, right forehead above brow x 1  Hypertrophic at right medial pretibial, recheck at follow up  Destruction of lesion - Right medial pretibial x 1, right forehead above brow x 1  Destruction method: cryotherapy   Informed consent: discussed and consent obtained   Lesion destroyed using liquid nitrogen: Yes   Cryotherapy cycles:  2 Outcome: patient tolerated procedure well with no complications   Post-procedure details: wound care instructions given    Return in about 2 months (around 06/30/2021) for AK follow up. Including Aks treated this visit and Aks treated at previous visit.  Graciella Belton, RMA, am acting as scribe for Forest Gleason, MD .  Documentation: I have reviewed the above documentation for accuracy and completeness, and I agree with the above.  Forest Gleason, MD

## 2021-04-30 NOTE — Patient Instructions (Addendum)
Cryotherapy Aftercare  Wash gently with soap and water everyday.   Apply Vaseline and Band-Aid daily until healed.   Prior to procedure, discussed risks of blister formation, small wound, skin dyspigmentation, or rare scar following cryotherapy. Recommend Vaseline ointment to treated areas while healing.  Recommend daily broad spectrum sunscreen SPF 30+ to sun-exposed areas, reapply every 2 hours as needed. Call for new or changing lesions.  Staying in the shade or wearing long sleeves, sun glasses (UVA+UVB protection) and wide brim hats (4-inch brim around the entire circumference of the hat) are also recommended for sun protection.   If you have any questions or concerns for your doctor, please call our main line at [phone] and press option 4 to reach your doctor's medical assistant. If no one answers, please leave a voicemail as directed and we will return your call as soon as possible. Messages left after 4 pm will be answered the following business day.   You may also send us a message via MyChart. We typically respond to MyChart messages within 1-2 business days.  For prescription refills, please ask your pharmacy to contact our office. Our fax number is [phone].  If you have an urgent issue when the clinic is closed that cannot wait until the next business day, you can page your doctor at the number below.    Please note that while we do our best to be available for urgent issues outside of office hours, we are not available 24/7.   If you have an urgent issue and are unable to reach us, you may choose to seek medical care at your doctor's office, retail clinic, urgent care center, or emergency room.  If you have a medical emergency, please immediately call 911 or go to the emergency department.  Pager Numbers  - Dr. Kowalski: [phone]  - Dr. Moye: [phone]  - Dr. Stewart: [phone]  In the event of inclement weather, please call our main line at  [phone] for an update on the status of any delays or closures.  Dermatology Medication Tips: Please keep the boxes that topical medications come in in order to help keep track of the instructions about where and how to use these. Pharmacies typically print the medication instructions only on the boxes and not directly on the medication tubes.   If your medication is too expensive, please contact our office at [phone] option 4 or send us a message through MyChart.   We are unable to tell what your co-pay for medications will be in advance as this is different depending on your insurance coverage. However, we may be able to find a substitute medication at lower cost or fill out paperwork to get insurance to cover a needed medication.   If a prior authorization is required to get your medication covered by your insurance company, please allow us 1-2 business days to complete this process.  Drug prices often vary depending on where the prescription is filled and some pharmacies may offer cheaper prices.  The website www.goodrx.com contains coupons for medications through different pharmacies. The prices here do not account for what the cost may be with help from insurance (it may be cheaper with your insurance), but the website can give you the price if you did not use any insurance.  - You can print the associated coupon and take it with your prescription to the pharmacy.  - You may also stop by our office during regular business hours and pick up a GoodRx coupon card.  -   If you need your prescription sent electronically to a different pharmacy, notify our office through [facility] MyChart or by phone at [phone] option 4.  

## 2021-05-02 DIAGNOSIS — Z20822 Contact with and (suspected) exposure to covid-19: Secondary | ICD-10-CM | POA: Diagnosis not present

## 2021-05-03 DIAGNOSIS — M67922 Unspecified disorder of synovium and tendon, left upper arm: Secondary | ICD-10-CM | POA: Diagnosis not present

## 2021-05-03 DIAGNOSIS — G8929 Other chronic pain: Secondary | ICD-10-CM | POA: Diagnosis not present

## 2021-05-03 DIAGNOSIS — M24012 Loose body in left shoulder: Secondary | ICD-10-CM | POA: Diagnosis not present

## 2021-05-03 DIAGNOSIS — M19012 Primary osteoarthritis, left shoulder: Secondary | ICD-10-CM | POA: Diagnosis not present

## 2021-05-03 DIAGNOSIS — M7522 Bicipital tendinitis, left shoulder: Secondary | ICD-10-CM | POA: Diagnosis not present

## 2021-05-03 DIAGNOSIS — M25512 Pain in left shoulder: Secondary | ICD-10-CM | POA: Diagnosis not present

## 2021-05-10 ENCOUNTER — Ambulatory Visit (INDEPENDENT_AMBULATORY_CARE_PROVIDER_SITE_OTHER): Payer: Self-pay | Admitting: *Deleted

## 2021-05-10 ENCOUNTER — Other Ambulatory Visit: Payer: Self-pay

## 2021-05-10 DIAGNOSIS — M19012 Primary osteoarthritis, left shoulder: Secondary | ICD-10-CM | POA: Diagnosis not present

## 2021-05-10 DIAGNOSIS — B351 Tinea unguium: Secondary | ICD-10-CM

## 2021-05-10 DIAGNOSIS — M79676 Pain in unspecified toe(s): Secondary | ICD-10-CM

## 2021-05-10 NOTE — Progress Notes (Signed)
Patient presents today for the 7th (additional treatments) laser treatment. 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   Dr. Milinda Pointer recommended patient have 2 more treatments.   All other systems are negative.  Nails were filed thin. Laser therapy was administered to 1-5 toenails bilateral and patient tolerated the treatment well. All safety precautions were in place.    Follow up for laser #8 in 8 weeks  ~Next treatment will be his last and then follow up with Dr. Milinda Pointer in 2 months for re-check~

## 2021-05-15 ENCOUNTER — Other Ambulatory Visit: Payer: Self-pay | Admitting: Internal Medicine

## 2021-05-15 DIAGNOSIS — Z23 Encounter for immunization: Secondary | ICD-10-CM | POA: Diagnosis not present

## 2021-05-17 DIAGNOSIS — M19012 Primary osteoarthritis, left shoulder: Secondary | ICD-10-CM | POA: Diagnosis not present

## 2021-05-21 ENCOUNTER — Telehealth: Payer: Self-pay | Admitting: Internal Medicine

## 2021-05-21 NOTE — Telephone Encounter (Signed)
Thank you. I have updated his record.

## 2021-05-21 NOTE — Telephone Encounter (Signed)
Pt called wanting to update his chart. He states he received his Quadrivalent high dose flu shot on 9/14.

## 2021-05-23 DIAGNOSIS — Z23 Encounter for immunization: Secondary | ICD-10-CM | POA: Diagnosis not present

## 2021-05-28 ENCOUNTER — Other Ambulatory Visit: Payer: Self-pay

## 2021-05-28 ENCOUNTER — Ambulatory Visit (INDEPENDENT_AMBULATORY_CARE_PROVIDER_SITE_OTHER): Payer: Medicare Other | Admitting: Dermatology

## 2021-05-28 ENCOUNTER — Encounter: Payer: Self-pay | Admitting: Dermatology

## 2021-05-28 DIAGNOSIS — L570 Actinic keratosis: Secondary | ICD-10-CM | POA: Diagnosis not present

## 2021-05-28 DIAGNOSIS — L820 Inflamed seborrheic keratosis: Secondary | ICD-10-CM | POA: Diagnosis not present

## 2021-05-28 NOTE — Progress Notes (Signed)
   Follow-Up Visit   Subjective  Kevin Terrell is a 80 y.o. male who presents for the following: Actinic Keratosis (4 weeks f/u on precancers treated on the right pretibial and right forehead with LN2 ). Pt c/o irritated crusty area on the right leg that will not go away.    The following portions of the chart were reviewed this encounter and updated as appropriate:   Tobacco  Allergies  Meds  Problems  Med Hx  Surg Hx  Fam Hx      Review of Systems:  No other skin or systemic complaints except as noted in HPI or Assessment and Plan.  Objective  Well appearing patient in no apparent distress; mood and affect are within normal limits.  A focused examination was performed including right pretibial, face . Relevant physical exam findings are noted in the Assessment and Plan.  Right pretibal, right preauricular, right cheek,right superior brow, left temple (5) Erythematous thin papules/macules with gritty scale.   right medial ankle  (1) Erythematous keratotic or waxy stuck-on papule or plaque.    Assessment & Plan  AK (actinic keratosis) (5) Right pretibal, right preauricular, right cheek,right superior brow, left temple  Actinic keratoses are precancerous spots that appear secondary to cumulative UV radiation exposure/sun exposure over time. They are chronic with expected duration over 1 year. A portion of actinic keratoses will progress to squamous cell carcinoma of the skin. It is not possible to reliably predict which spots will progress to skin cancer and so treatment is recommended to prevent development of skin cancer.  Recommend daily broad spectrum sunscreen SPF 30+ to sun-exposed areas, reapply every 2 hours as needed.  Recommend staying in the shade or wearing long sleeves, sun glasses (UVA+UVB protection) and wide brim hats (4-inch brim around the entire circumference of the hat). Call for new or changing lesions.   Prior to procedure, discussed risks of blister  formation, small wound, skin dyspigmentation, or rare scar following cryotherapy. Recommend Vaseline ointment to treated areas while healing.   Destruction of lesion - Right pretibal, right preauricular, right cheek,right superior brow, left temple Complexity: simple   Destruction method: cryotherapy   Informed consent: discussed and consent obtained   Timeout:  patient name, date of birth, surgical site, and procedure verified Lesion destroyed using liquid nitrogen: Yes   Region frozen until ice ball extended beyond lesion: Yes   Outcome: patient tolerated procedure well with no complications   Post-procedure details: wound care instructions given    Inflamed seborrheic keratosis right medial ankle  (1)  Prior to procedure, discussed risks of blister formation, small wound, skin dyspigmentation, or rare scar following cryotherapy. Recommend Vaseline ointment to treated areas while healing.   Destruction of lesion - right medial ankle  (1) Complexity: simple   Destruction method: cryotherapy   Informed consent: discussed and consent obtained   Timeout:  patient name, date of birth, surgical site, and procedure verified Lesion destroyed using liquid nitrogen: Yes   Region frozen until ice ball extended beyond lesion: Yes   Outcome: patient tolerated procedure well with no complications   Post-procedure details: wound care instructions given    Return in about 4 months (around 09/27/2021) for Aks.  I, Marye Round, CMA, am acting as scribe for Forest Gleason, MD .   Documentation: I have reviewed the above documentation for accuracy and completeness, and I agree with the above.  Forest Gleason, MD

## 2021-05-28 NOTE — Patient Instructions (Addendum)

## 2021-05-29 ENCOUNTER — Ambulatory Visit: Payer: Medicare Other | Admitting: Dermatology

## 2021-05-30 ENCOUNTER — Ambulatory Visit: Payer: Medicare Other | Admitting: Dermatology

## 2021-06-19 DIAGNOSIS — Z20822 Contact with and (suspected) exposure to covid-19: Secondary | ICD-10-CM | POA: Diagnosis not present

## 2021-06-25 NOTE — Telephone Encounter (Signed)
Spoke to pt to verify which bivalent vaccine he received. I have updated his record. Told him where to find his immunizations.

## 2021-06-25 NOTE — Telephone Encounter (Signed)
Pt called in to update his chart stated he received his Covid Booster on 05/23/21 . Wants to know will the  updated be on Mychart as well

## 2021-06-26 ENCOUNTER — Other Ambulatory Visit: Payer: Self-pay | Admitting: Internal Medicine

## 2021-06-27 NOTE — Telephone Encounter (Signed)
This is not on his med list. Message from pharmacy:PT [place]. HE HAS BEEN TAKING OTC PEPCID. ANY QUESTIONS PLEASE CALL PATIENT. [place]

## 2021-07-04 ENCOUNTER — Ambulatory Visit: Payer: Medicare Other | Admitting: Dermatology

## 2021-07-05 ENCOUNTER — Other Ambulatory Visit: Payer: Self-pay | Admitting: Internal Medicine

## 2021-07-12 ENCOUNTER — Ambulatory Visit (INDEPENDENT_AMBULATORY_CARE_PROVIDER_SITE_OTHER): Payer: Medicare Other | Admitting: *Deleted

## 2021-07-12 ENCOUNTER — Other Ambulatory Visit: Payer: Self-pay

## 2021-07-12 DIAGNOSIS — B351 Tinea unguium: Secondary | ICD-10-CM

## 2021-07-12 DIAGNOSIS — M79676 Pain in unspecified toe(s): Secondary | ICD-10-CM

## 2021-07-12 NOTE — Progress Notes (Signed)
Patient presents today for the 8th (additional treatments) laser treatment. 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   Dr. Milinda Pointer recommended patient have 2 more treatments.   All other systems are negative.  Nails were filed thin. Laser therapy was administered to 1-5 toenails bilateral and patient tolerated the treatment well. All safety precautions were in place.    Patient would like one more laser treatment before following up with Dr. Milinda Pointer, so we will schedule him for one more in 8 weeks, laser #9.   He is also interested in Laser maintenance.

## 2021-07-16 DIAGNOSIS — M5412 Radiculopathy, cervical region: Secondary | ICD-10-CM | POA: Diagnosis not present

## 2021-07-16 DIAGNOSIS — M5033 Other cervical disc degeneration, cervicothoracic region: Secondary | ICD-10-CM | POA: Diagnosis not present

## 2021-07-16 DIAGNOSIS — M9902 Segmental and somatic dysfunction of thoracic region: Secondary | ICD-10-CM | POA: Diagnosis not present

## 2021-07-16 DIAGNOSIS — M9901 Segmental and somatic dysfunction of cervical region: Secondary | ICD-10-CM | POA: Diagnosis not present

## 2021-07-17 ENCOUNTER — Other Ambulatory Visit: Payer: Self-pay

## 2021-07-17 ENCOUNTER — Other Ambulatory Visit (INDEPENDENT_AMBULATORY_CARE_PROVIDER_SITE_OTHER): Payer: Medicare Other

## 2021-07-17 DIAGNOSIS — Z79899 Other long term (current) drug therapy: Secondary | ICD-10-CM

## 2021-07-17 DIAGNOSIS — M24012 Loose body in left shoulder: Secondary | ICD-10-CM | POA: Diagnosis not present

## 2021-07-17 DIAGNOSIS — E7800 Pure hypercholesterolemia, unspecified: Secondary | ICD-10-CM | POA: Diagnosis not present

## 2021-07-17 DIAGNOSIS — M7522 Bicipital tendinitis, left shoulder: Secondary | ICD-10-CM | POA: Diagnosis not present

## 2021-07-17 DIAGNOSIS — M25512 Pain in left shoulder: Secondary | ICD-10-CM | POA: Diagnosis not present

## 2021-07-17 DIAGNOSIS — M9902 Segmental and somatic dysfunction of thoracic region: Secondary | ICD-10-CM | POA: Diagnosis not present

## 2021-07-17 DIAGNOSIS — M9901 Segmental and somatic dysfunction of cervical region: Secondary | ICD-10-CM | POA: Diagnosis not present

## 2021-07-17 DIAGNOSIS — M5412 Radiculopathy, cervical region: Secondary | ICD-10-CM | POA: Diagnosis not present

## 2021-07-17 DIAGNOSIS — M67922 Unspecified disorder of synovium and tendon, left upper arm: Secondary | ICD-10-CM | POA: Diagnosis not present

## 2021-07-17 DIAGNOSIS — M19012 Primary osteoarthritis, left shoulder: Secondary | ICD-10-CM | POA: Diagnosis not present

## 2021-07-17 DIAGNOSIS — G8929 Other chronic pain: Secondary | ICD-10-CM | POA: Diagnosis not present

## 2021-07-17 DIAGNOSIS — M5033 Other cervical disc degeneration, cervicothoracic region: Secondary | ICD-10-CM | POA: Diagnosis not present

## 2021-07-17 NOTE — Telephone Encounter (Signed)
Pt has arrived today in office to have fasting lipid and alt lab.  Orders changed for our office to draw labs.

## 2021-07-17 NOTE — Addendum Note (Signed)
Addended by: Darlyne Russian on: 07/17/2021 09:22 AM   Modules accepted: Orders

## 2021-07-18 ENCOUNTER — Encounter: Payer: Self-pay | Admitting: Internal Medicine

## 2021-07-18 DIAGNOSIS — M9901 Segmental and somatic dysfunction of cervical region: Secondary | ICD-10-CM | POA: Diagnosis not present

## 2021-07-18 DIAGNOSIS — M5033 Other cervical disc degeneration, cervicothoracic region: Secondary | ICD-10-CM | POA: Diagnosis not present

## 2021-07-18 DIAGNOSIS — M9902 Segmental and somatic dysfunction of thoracic region: Secondary | ICD-10-CM | POA: Diagnosis not present

## 2021-07-18 DIAGNOSIS — M5412 Radiculopathy, cervical region: Secondary | ICD-10-CM | POA: Diagnosis not present

## 2021-07-18 LAB — ALT: ALT: 19 IU/L (ref 0–44)

## 2021-07-18 LAB — LIPID PANEL
Chol/HDL Ratio: 2.8 ratio (ref 0.0–5.0)
Cholesterol, Total: 149 mg/dL (ref 100–199)
HDL: 53 mg/dL (ref >39)
LDL Chol Calc (NIH): 83 mg/dL (ref 0–99)
Triglycerides: 65 mg/dL (ref 0–149)
VLDL Cholesterol Cal: 13 mg/dL (ref 5–40)

## 2021-07-22 DIAGNOSIS — M5412 Radiculopathy, cervical region: Secondary | ICD-10-CM | POA: Diagnosis not present

## 2021-07-22 DIAGNOSIS — M9902 Segmental and somatic dysfunction of thoracic region: Secondary | ICD-10-CM | POA: Diagnosis not present

## 2021-07-22 DIAGNOSIS — M9901 Segmental and somatic dysfunction of cervical region: Secondary | ICD-10-CM | POA: Diagnosis not present

## 2021-07-22 DIAGNOSIS — Z20822 Contact with and (suspected) exposure to covid-19: Secondary | ICD-10-CM | POA: Diagnosis not present

## 2021-07-22 DIAGNOSIS — M5033 Other cervical disc degeneration, cervicothoracic region: Secondary | ICD-10-CM | POA: Diagnosis not present

## 2021-07-23 DIAGNOSIS — M5412 Radiculopathy, cervical region: Secondary | ICD-10-CM | POA: Diagnosis not present

## 2021-07-23 DIAGNOSIS — M5033 Other cervical disc degeneration, cervicothoracic region: Secondary | ICD-10-CM | POA: Diagnosis not present

## 2021-07-23 DIAGNOSIS — M9901 Segmental and somatic dysfunction of cervical region: Secondary | ICD-10-CM | POA: Diagnosis not present

## 2021-07-23 DIAGNOSIS — M9902 Segmental and somatic dysfunction of thoracic region: Secondary | ICD-10-CM | POA: Diagnosis not present

## 2021-07-24 DIAGNOSIS — M1712 Unilateral primary osteoarthritis, left knee: Secondary | ICD-10-CM | POA: Diagnosis not present

## 2021-07-24 DIAGNOSIS — M9901 Segmental and somatic dysfunction of cervical region: Secondary | ICD-10-CM | POA: Diagnosis not present

## 2021-07-24 DIAGNOSIS — M5412 Radiculopathy, cervical region: Secondary | ICD-10-CM | POA: Diagnosis not present

## 2021-07-24 DIAGNOSIS — M25562 Pain in left knee: Secondary | ICD-10-CM | POA: Diagnosis not present

## 2021-07-24 DIAGNOSIS — M25561 Pain in right knee: Secondary | ICD-10-CM | POA: Diagnosis not present

## 2021-07-24 DIAGNOSIS — G8929 Other chronic pain: Secondary | ICD-10-CM | POA: Diagnosis not present

## 2021-07-24 DIAGNOSIS — M9902 Segmental and somatic dysfunction of thoracic region: Secondary | ICD-10-CM | POA: Diagnosis not present

## 2021-07-24 DIAGNOSIS — M5033 Other cervical disc degeneration, cervicothoracic region: Secondary | ICD-10-CM | POA: Diagnosis not present

## 2021-07-24 DIAGNOSIS — M1711 Unilateral primary osteoarthritis, right knee: Secondary | ICD-10-CM | POA: Diagnosis not present

## 2021-07-29 DIAGNOSIS — M9902 Segmental and somatic dysfunction of thoracic region: Secondary | ICD-10-CM | POA: Diagnosis not present

## 2021-07-29 DIAGNOSIS — M5033 Other cervical disc degeneration, cervicothoracic region: Secondary | ICD-10-CM | POA: Diagnosis not present

## 2021-07-29 DIAGNOSIS — M9901 Segmental and somatic dysfunction of cervical region: Secondary | ICD-10-CM | POA: Diagnosis not present

## 2021-07-29 DIAGNOSIS — M5412 Radiculopathy, cervical region: Secondary | ICD-10-CM | POA: Diagnosis not present

## 2021-08-01 ENCOUNTER — Ambulatory Visit: Payer: Medicare Other | Admitting: Dermatology

## 2021-08-01 DIAGNOSIS — M5412 Radiculopathy, cervical region: Secondary | ICD-10-CM | POA: Diagnosis not present

## 2021-08-01 DIAGNOSIS — M5033 Other cervical disc degeneration, cervicothoracic region: Secondary | ICD-10-CM | POA: Diagnosis not present

## 2021-08-01 DIAGNOSIS — M9901 Segmental and somatic dysfunction of cervical region: Secondary | ICD-10-CM | POA: Diagnosis not present

## 2021-08-01 DIAGNOSIS — M9902 Segmental and somatic dysfunction of thoracic region: Secondary | ICD-10-CM | POA: Diagnosis not present

## 2021-08-06 DIAGNOSIS — G8929 Other chronic pain: Secondary | ICD-10-CM | POA: Diagnosis not present

## 2021-08-06 DIAGNOSIS — M9902 Segmental and somatic dysfunction of thoracic region: Secondary | ICD-10-CM | POA: Diagnosis not present

## 2021-08-06 DIAGNOSIS — M1711 Unilateral primary osteoarthritis, right knee: Secondary | ICD-10-CM | POA: Diagnosis not present

## 2021-08-06 DIAGNOSIS — M25562 Pain in left knee: Secondary | ICD-10-CM | POA: Diagnosis not present

## 2021-08-06 DIAGNOSIS — M5033 Other cervical disc degeneration, cervicothoracic region: Secondary | ICD-10-CM | POA: Diagnosis not present

## 2021-08-06 DIAGNOSIS — M1712 Unilateral primary osteoarthritis, left knee: Secondary | ICD-10-CM | POA: Diagnosis not present

## 2021-08-06 DIAGNOSIS — M25561 Pain in right knee: Secondary | ICD-10-CM | POA: Diagnosis not present

## 2021-08-06 DIAGNOSIS — M9901 Segmental and somatic dysfunction of cervical region: Secondary | ICD-10-CM | POA: Diagnosis not present

## 2021-08-06 DIAGNOSIS — M5412 Radiculopathy, cervical region: Secondary | ICD-10-CM | POA: Diagnosis not present

## 2021-08-08 DIAGNOSIS — M9901 Segmental and somatic dysfunction of cervical region: Secondary | ICD-10-CM | POA: Diagnosis not present

## 2021-08-08 DIAGNOSIS — M5033 Other cervical disc degeneration, cervicothoracic region: Secondary | ICD-10-CM | POA: Diagnosis not present

## 2021-08-08 DIAGNOSIS — M5412 Radiculopathy, cervical region: Secondary | ICD-10-CM | POA: Diagnosis not present

## 2021-08-08 DIAGNOSIS — M9902 Segmental and somatic dysfunction of thoracic region: Secondary | ICD-10-CM | POA: Diagnosis not present

## 2021-08-13 DIAGNOSIS — M25561 Pain in right knee: Secondary | ICD-10-CM | POA: Diagnosis not present

## 2021-08-13 DIAGNOSIS — M1711 Unilateral primary osteoarthritis, right knee: Secondary | ICD-10-CM | POA: Diagnosis not present

## 2021-08-13 DIAGNOSIS — G8929 Other chronic pain: Secondary | ICD-10-CM | POA: Diagnosis not present

## 2021-08-13 DIAGNOSIS — M25562 Pain in left knee: Secondary | ICD-10-CM | POA: Diagnosis not present

## 2021-08-13 DIAGNOSIS — M1712 Unilateral primary osteoarthritis, left knee: Secondary | ICD-10-CM | POA: Diagnosis not present

## 2021-08-14 DIAGNOSIS — M5412 Radiculopathy, cervical region: Secondary | ICD-10-CM | POA: Diagnosis not present

## 2021-08-14 DIAGNOSIS — M9901 Segmental and somatic dysfunction of cervical region: Secondary | ICD-10-CM | POA: Diagnosis not present

## 2021-08-14 DIAGNOSIS — M9902 Segmental and somatic dysfunction of thoracic region: Secondary | ICD-10-CM | POA: Diagnosis not present

## 2021-08-14 DIAGNOSIS — M5033 Other cervical disc degeneration, cervicothoracic region: Secondary | ICD-10-CM | POA: Diagnosis not present

## 2021-08-17 ENCOUNTER — Other Ambulatory Visit: Payer: Self-pay | Admitting: Dermatology

## 2021-08-18 ENCOUNTER — Encounter: Payer: Self-pay | Admitting: Internal Medicine

## 2021-08-19 ENCOUNTER — Encounter: Payer: Self-pay | Admitting: Internal Medicine

## 2021-08-20 ENCOUNTER — Other Ambulatory Visit: Payer: Self-pay

## 2021-08-20 ENCOUNTER — Other Ambulatory Visit: Payer: Self-pay | Admitting: Dermatology

## 2021-08-20 DIAGNOSIS — G8929 Other chronic pain: Secondary | ICD-10-CM | POA: Diagnosis not present

## 2021-08-20 DIAGNOSIS — M25562 Pain in left knee: Secondary | ICD-10-CM | POA: Diagnosis not present

## 2021-08-20 DIAGNOSIS — M25561 Pain in right knee: Secondary | ICD-10-CM | POA: Diagnosis not present

## 2021-08-20 DIAGNOSIS — M1712 Unilateral primary osteoarthritis, left knee: Secondary | ICD-10-CM | POA: Diagnosis not present

## 2021-08-20 DIAGNOSIS — M1711 Unilateral primary osteoarthritis, right knee: Secondary | ICD-10-CM | POA: Diagnosis not present

## 2021-08-20 DIAGNOSIS — M25462 Effusion, left knee: Secondary | ICD-10-CM | POA: Diagnosis not present

## 2021-08-20 MED ORDER — TRIAMCINOLONE ACETONIDE 0.1 % EX CREA: TOPICAL_CREAM | CUTANEOUS | 1 refills | Status: DC

## 2021-08-21 DIAGNOSIS — M5033 Other cervical disc degeneration, cervicothoracic region: Secondary | ICD-10-CM | POA: Diagnosis not present

## 2021-08-21 DIAGNOSIS — M5412 Radiculopathy, cervical region: Secondary | ICD-10-CM | POA: Diagnosis not present

## 2021-08-21 DIAGNOSIS — M9901 Segmental and somatic dysfunction of cervical region: Secondary | ICD-10-CM | POA: Diagnosis not present

## 2021-08-21 DIAGNOSIS — Z20822 Contact with and (suspected) exposure to covid-19: Secondary | ICD-10-CM | POA: Diagnosis not present

## 2021-08-21 DIAGNOSIS — M9902 Segmental and somatic dysfunction of thoracic region: Secondary | ICD-10-CM | POA: Diagnosis not present

## 2021-08-28 DIAGNOSIS — M9902 Segmental and somatic dysfunction of thoracic region: Secondary | ICD-10-CM | POA: Diagnosis not present

## 2021-08-28 DIAGNOSIS — M5033 Other cervical disc degeneration, cervicothoracic region: Secondary | ICD-10-CM | POA: Diagnosis not present

## 2021-08-28 DIAGNOSIS — M5412 Radiculopathy, cervical region: Secondary | ICD-10-CM | POA: Diagnosis not present

## 2021-08-28 DIAGNOSIS — M9901 Segmental and somatic dysfunction of cervical region: Secondary | ICD-10-CM | POA: Diagnosis not present

## 2021-09-04 ENCOUNTER — Other Ambulatory Visit: Payer: Self-pay | Admitting: Internal Medicine

## 2021-09-04 DIAGNOSIS — M9902 Segmental and somatic dysfunction of thoracic region: Secondary | ICD-10-CM | POA: Diagnosis not present

## 2021-09-04 DIAGNOSIS — M9901 Segmental and somatic dysfunction of cervical region: Secondary | ICD-10-CM | POA: Diagnosis not present

## 2021-09-04 DIAGNOSIS — M5033 Other cervical disc degeneration, cervicothoracic region: Secondary | ICD-10-CM | POA: Diagnosis not present

## 2021-09-04 DIAGNOSIS — M62830 Muscle spasm of back: Secondary | ICD-10-CM | POA: Diagnosis not present

## 2021-09-11 DIAGNOSIS — M9902 Segmental and somatic dysfunction of thoracic region: Secondary | ICD-10-CM | POA: Diagnosis not present

## 2021-09-11 DIAGNOSIS — M5033 Other cervical disc degeneration, cervicothoracic region: Secondary | ICD-10-CM | POA: Diagnosis not present

## 2021-09-11 DIAGNOSIS — M62830 Muscle spasm of back: Secondary | ICD-10-CM | POA: Diagnosis not present

## 2021-09-11 DIAGNOSIS — M9901 Segmental and somatic dysfunction of cervical region: Secondary | ICD-10-CM | POA: Diagnosis not present

## 2021-09-13 ENCOUNTER — Other Ambulatory Visit: Payer: Self-pay

## 2021-09-13 ENCOUNTER — Ambulatory Visit (INDEPENDENT_AMBULATORY_CARE_PROVIDER_SITE_OTHER): Payer: Medicare Other

## 2021-09-13 DIAGNOSIS — B351 Tinea unguium: Secondary | ICD-10-CM

## 2021-09-13 DIAGNOSIS — M79676 Pain in unspecified toe(s): Secondary | ICD-10-CM

## 2021-09-13 NOTE — Progress Notes (Signed)
Patient presents today for the 9th (additional treatments) laser treatment. 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   Dr. Milinda Pointer recommended patient have 2 more treatments.   All other systems are negative.  Nails were filed thin. Laser therapy was administered to 1-5 toenails bilateral and patient tolerated the treatment well. All safety precautions were in place.    Patient would like one more laser treatment before following up with Dr. Milinda Pointer, so we will schedule him for one more in 8 weeks, final laser .   He is also interested in Laser maintenance.

## 2021-09-18 DIAGNOSIS — M5033 Other cervical disc degeneration, cervicothoracic region: Secondary | ICD-10-CM | POA: Diagnosis not present

## 2021-09-18 DIAGNOSIS — M9901 Segmental and somatic dysfunction of cervical region: Secondary | ICD-10-CM | POA: Diagnosis not present

## 2021-09-18 DIAGNOSIS — M9902 Segmental and somatic dysfunction of thoracic region: Secondary | ICD-10-CM | POA: Diagnosis not present

## 2021-09-18 DIAGNOSIS — M62830 Muscle spasm of back: Secondary | ICD-10-CM | POA: Diagnosis not present

## 2021-09-20 ENCOUNTER — Telehealth: Payer: Self-pay | Admitting: Oncology

## 2021-09-20 NOTE — Telephone Encounter (Signed)
Pt called to reschedule his appt for 2-20. Call back at [phone]

## 2021-09-22 DIAGNOSIS — Z20822 Contact with and (suspected) exposure to covid-19: Secondary | ICD-10-CM | POA: Diagnosis not present

## 2021-09-25 DIAGNOSIS — M62830 Muscle spasm of back: Secondary | ICD-10-CM | POA: Diagnosis not present

## 2021-09-25 DIAGNOSIS — M9902 Segmental and somatic dysfunction of thoracic region: Secondary | ICD-10-CM | POA: Diagnosis not present

## 2021-09-25 DIAGNOSIS — M9901 Segmental and somatic dysfunction of cervical region: Secondary | ICD-10-CM | POA: Diagnosis not present

## 2021-09-25 DIAGNOSIS — M5033 Other cervical disc degeneration, cervicothoracic region: Secondary | ICD-10-CM | POA: Diagnosis not present

## 2021-09-30 DIAGNOSIS — R1032 Left lower quadrant pain: Secondary | ICD-10-CM | POA: Diagnosis not present

## 2021-09-30 DIAGNOSIS — K811 Chronic cholecystitis: Secondary | ICD-10-CM | POA: Diagnosis not present

## 2021-10-02 DIAGNOSIS — M62830 Muscle spasm of back: Secondary | ICD-10-CM | POA: Diagnosis not present

## 2021-10-02 DIAGNOSIS — M67922 Unspecified disorder of synovium and tendon, left upper arm: Secondary | ICD-10-CM | POA: Diagnosis not present

## 2021-10-02 DIAGNOSIS — M25512 Pain in left shoulder: Secondary | ICD-10-CM | POA: Diagnosis not present

## 2021-10-02 DIAGNOSIS — M9902 Segmental and somatic dysfunction of thoracic region: Secondary | ICD-10-CM | POA: Diagnosis not present

## 2021-10-02 DIAGNOSIS — M7522 Bicipital tendinitis, left shoulder: Secondary | ICD-10-CM | POA: Diagnosis not present

## 2021-10-02 DIAGNOSIS — M19012 Primary osteoarthritis, left shoulder: Secondary | ICD-10-CM | POA: Diagnosis not present

## 2021-10-02 DIAGNOSIS — M24012 Loose body in left shoulder: Secondary | ICD-10-CM | POA: Diagnosis not present

## 2021-10-02 DIAGNOSIS — M5033 Other cervical disc degeneration, cervicothoracic region: Secondary | ICD-10-CM | POA: Diagnosis not present

## 2021-10-02 DIAGNOSIS — M9901 Segmental and somatic dysfunction of cervical region: Secondary | ICD-10-CM | POA: Diagnosis not present

## 2021-10-02 DIAGNOSIS — G8929 Other chronic pain: Secondary | ICD-10-CM | POA: Diagnosis not present

## 2021-10-03 ENCOUNTER — Telehealth: Payer: Self-pay

## 2021-10-03 ENCOUNTER — Ambulatory Visit (INDEPENDENT_AMBULATORY_CARE_PROVIDER_SITE_OTHER): Payer: Medicare Other | Admitting: Dermatology

## 2021-10-03 ENCOUNTER — Encounter: Payer: Self-pay | Admitting: Dermatology

## 2021-10-03 ENCOUNTER — Other Ambulatory Visit: Payer: Self-pay

## 2021-10-03 ENCOUNTER — Telehealth: Payer: Self-pay | Admitting: Internal Medicine

## 2021-10-03 DIAGNOSIS — L821 Other seborrheic keratosis: Secondary | ICD-10-CM | POA: Diagnosis not present

## 2021-10-03 DIAGNOSIS — C44622 Squamous cell carcinoma of skin of right upper limb, including shoulder: Secondary | ICD-10-CM | POA: Diagnosis not present

## 2021-10-03 DIAGNOSIS — L820 Inflamed seborrheic keratosis: Secondary | ICD-10-CM

## 2021-10-03 DIAGNOSIS — L570 Actinic keratosis: Secondary | ICD-10-CM

## 2021-10-03 DIAGNOSIS — D492 Neoplasm of unspecified behavior of bone, soft tissue, and skin: Secondary | ICD-10-CM

## 2021-10-03 MED ORDER — DICLOFENAC SODIUM 1 % EX GEL: CUTANEOUS | 11 refills | Status: DC

## 2021-10-03 NOTE — Telephone Encounter (Signed)
Pt called asking if Dr Silvio Pate would rewrite prescription for medication  diclofenac Sodium (VOLTAREN) 1 % GEL for 2 tubes a month instead of 1. Please advise.

## 2021-10-03 NOTE — Telephone Encounter (Signed)
Patient left message following appt today advising that the spot he was concerned about is not the spot that was treated. AK at right lower lip treated with LN2 today but patient advises the spot he noticed was at left lip. I checked the schedule and we could see him 2/8 at 12 noon. I did not see anything in the note about a spot at left lip. Caryl Pina is here tomorrow if you want her to call and schedule him.  Lurlean Horns, RMA

## 2021-10-03 NOTE — Telephone Encounter (Signed)
Done

## 2021-10-03 NOTE — Telephone Encounter (Signed)
Patient did point out the spot at the right lip today. I did not take a look at his left lip as we were only rechecking precancers previously treated and checking spots he pointed out today. Agree with scheduling him for that f/u. Thank you!

## 2021-10-03 NOTE — Patient Instructions (Addendum)
Cryotherapy Aftercare  Wash gently with soap and water everyday.   Apply Vaseline and Band-Aid daily until healed.   Prior to procedure, discussed risks of blister formation, small wound, skin dyspigmentation, or rare scar following cryotherapy. Recommend Vaseline ointment to treated areas while healing.   Recommend taking Heliocare sun protection supplement daily in sunny weather for additional sun protection. For maximum protection on the sunniest days, you can take up to 2 capsules of regular Heliocare OR take 1 capsule of Heliocare Ultra. For prolonged exposure (such as a full day in the sun), you can repeat your dose of the supplement 4 hours after your first dose. Heliocare can be purchased at [employer], at some [facility] or at [email or web address].     Recommend daily broad spectrum sunscreen SPF 30+ to sun-exposed areas, reapply every 2 hours as needed. Call for new or changing lesions.  Staying in the shade or wearing long sleeves, sun glasses (UVA+UVB protection) and wide brim hats (4-inch brim around the entire circumference of the hat) are also recommended for sun protection.    Wound Care Instructions  Cleanse wound gently with soap and water once a day then pat dry with clean gauze. Apply a thing coat of Petrolatum (petroleum jelly, "Vaseline") over the wound (unless you have an allergy to this). We recommend that you use a new, sterile tube of Vaseline. Do not pick or remove scabs. Do not remove the yellow or white "healing tissue" from the base of the wound.  Cover the wound with fresh, clean, nonstick gauze and secure with paper tape. You may use Band-Aids in place of gauze and tape if the would is small enough, but would recommend trimming much of the tape off as there is often too much. Sometimes Band-Aids can irritate the skin.  You should call the office for your biopsy report after 1 week if you have not already been contacted.  If you experience any problems, such  as abnormal amounts of bleeding, swelling, significant bruising, significant pain, or evidence of infection, please call the office immediately.  FOR ADULT SURGERY PATIENTS: If you need something for pain relief you may take 1 extra strength Tylenol (acetaminophen) AND 2 Ibuprofen (200mg  each) together every 4 hours as needed for pain. (do not take these if you are allergic to them or if you have a reason you should not take them.) Typically, you may only need pain medication for 1 to 3 days.   If You Need Anything After Your Visit  If you have any questions or concerns for your doctor, please call our main line at [phone] and press option 4 to reach your doctor's medical assistant. If no one answers, please leave a voicemail as directed and we will return your call as soon as possible. Messages left after 4 pm will be answered the following business day.   You may also send [place] a message via [place]. We typically respond to MyChart messages within 1-2 business days.  For prescription refills, please ask your pharmacy to contact our office. Our fax number is [phone].  If you have an urgent issue when the clinic is closed that cannot wait until the next business day, you can page your doctor at the number below.    Please note that while we do our best to be available for urgent issues outside of office hours, we are not available 24/7.   If you have an urgent issue and are unable to reach [place], you may choose  to seek medical care at your doctor's office, retail clinic, urgent care center, or emergency room.  If you have a medical emergency, please immediately call 911 or go to the emergency department.  Pager Numbers  - Dr. Nehemiah Massed: [phone]  - Dr. Laurence Ferrari: [phone]  - Dr. Nicole Kindred: [phone]  In the event of inclement weather, please call our main line at [phone] for an update on the status of any delays or closures.  Dermatology Medication Tips: Please keep the  boxes that topical medications come in in order to help keep track of the instructions about where and how to use these. Pharmacies typically print the medication instructions only on the boxes and not directly on the medication tubes.   If your medication is too expensive, please contact our office at [phone] option 4 or send [place] a message through [place].   We are unable to tell what your co-pay for medications will be in advance as this is different depending on your insurance coverage. However, we may be able to find a substitute medication at lower cost or fill out paperwork to get insurance to cover a needed medication.   If a prior authorization is required to get your medication covered by your insurance company, please allow [place] 1-2 business days to complete this process.  Drug prices often vary depending on where the prescription is filled and some pharmacies may offer cheaper prices.  The website www.goodrx.com contains coupons for medications through different pharmacies. The prices here do not account for what the cost may be with help from insurance (it may be cheaper with your insurance), but the website can give you the price if you did not use any insurance.  - You can print the associated coupon and take it with your prescription to the pharmacy.  - You may also stop by our office during regular business hours and pick up a GoodRx coupon card.  - If you need your prescription sent electronically to a different pharmacy, notify our office through [facility] or by phone at [phone] option 4.     Si Usted Necesita Algo Despus de Su Visita  Tambin puede enviarnos un mensaje a travs de Pharmacist, community. Por lo general respondemos a los mensajes de MyChart en el transcurso de 1 a 2 das hbiles.  Para renovar recetas, por favor pida a su farmacia que se ponga en contacto con nuestra oficina. Harland Dingwall de fax es [place] [phone].  Si tiene un asunto urgente cuando la  clnica est cerrada y que no puede esperar hasta el siguiente da hbil, puede llamar/localizar a su doctor(a) al nmero que aparece a continuacin.   Por favor, tenga en cuenta que aunque hacemos todo lo posible para estar disponibles para asuntos urgentes fuera del horario de [place], no estamos disponibles las 24 horas del da, los 7 das de la [place].   Si tiene un problema urgente y no puede comunicarse con nosotros, puede optar por buscar atencin mdica  en el consultorio de su doctor(a), en una clnica privada, en un centro de atencin urgente o en una sala de emergencias.  Si tiene Engineering geologist, por favor llame inmediatamente al 911 o vaya a la sala de emergencias.  Nmeros de bper  - Dr. Nehemiah Massed: [phone]  - Dra. Moye: [phone]  - Dra. Nicole Kindred: [phone]  En caso de inclemencias del [place], por favor llame a Johnsie Kindred principal al [phone] para una actualizacin sobre el [place] de cualquier retraso o cierre.  Consejos  para Conservation officer, nature en dermatologa: Por favor, guarde las cajas en las que vienen los medicamentos de uso tpico para ayudarle a seguir las instrucciones sobre dnde y cmo usarlos. Las farmacias generalmente imprimen las instrucciones del medicamento slo en las cajas y no directamente en los tubos del [place].   Si su medicamento es muy caro, por favor, pngase en contacto con Zigmund Daniel llamando al [phone] y presione la opcin 4 o envenos un mensaje a travs de Pharmacist, community.   No podemos decirle cul ser su copago por los medicamentos por adelantado ya que esto es diferente dependiendo de la cobertura de su seguro. Sin embargo, es posible que podamos encontrar un medicamento sustituto a Electrical engineer un formulario para que el seguro cubra el medicamento que se considera necesario.   Si se requiere una autorizacin previa para que su compaa de seguros [place] su medicamento, por favor permtanos de 1 a 2 das hbiles  para completar este proceso.  Los precios de los medicamentos varan con frecuencia dependiendo del Environmental consultant de dnde se surte la receta y alguna farmacias pueden ofrecer precios ms baratos.  El sitio web www.goodrx.com tiene cupones para medicamentos de Airline pilot. Los precios aqu no tienen en cuenta lo que podra costar con la ayuda del seguro (puede ser ms barato con su seguro), pero el sitio web puede darle el precio si no utiliz Research scientist (physical sciences).  - Puede imprimir el cupn correspondiente y llevarlo con su receta a la farmacia.  - Tambin puede pasar por nuestra oficina durante el horario de atencin regular y Charity fundraiser una tarjeta de cupones de GoodRx.  - Si necesita que su receta se enve electrnicamente a una farmacia diferente, informe a nuestra oficina a travs de MyChart de [place] o por telfono llamando al [phone] y presione la opcin 4.

## 2021-10-03 NOTE — Progress Notes (Signed)
Follow-Up Visit   Subjective  Kevin Terrell is a 81 y.o. male who presents for the following: Actinic Keratosis (4 month recheck. Face, right leg. Patient states lesion on right leg may need to be redone. Also has new lesions on chest to address today).  The patient has spots, moles and lesions to be evaluated, some may be new or changing and the patient has concerns that these could be cancer.  The following portions of the chart were reviewed this encounter and updated as appropriate:  Tobacco   Allergies   Meds   Problems   Med Hx   Surg Hx   Fam Hx       Review of Systems: No other skin or systemic complaints except as noted in HPI or Assessment and Plan.   Objective  Well appearing patient in no apparent distress; mood and affect are within normal limits.  A focused examination was performed including scalp, face, neck, chest, hands, legs. Relevant physical exam findings are noted in the Assessment and Plan.  right medial ankle x1, chest x8, abdomen x1 (10) Erythematous keratotic or waxy stuck-on papule or plaque.  right medial pretibia x1 Erythematous keratotic plaque with gritty scale.   Right Upper Arm 0.5cm pink papule with central erosion      right preauricular x1, right forehead x1, right lower lip x1 (3) Erythematous thin papules/macules with gritty scale.    Assessment & Plan  Inflamed seborrheic keratosis (10) right medial ankle x1, chest x8, abdomen x1  Symptomatic  Prior to procedure, discussed risks of blister formation, small wound, skin dyspigmentation, or rare scar following cryotherapy. Recommend Vaseline ointment to treated areas while healing.   Destruction of lesion - right medial ankle x1, chest x8, abdomen x1  Destruction method: cryotherapy   Informed consent: discussed and consent obtained   Lesion destroyed using liquid nitrogen: Yes   Outcome: patient tolerated procedure well with no complications   Post-procedure details: wound care  instructions given    Hypertrophic actinic keratosis right medial pretibia x1  Prior to procedure, discussed risks of blister formation, small wound, skin dyspigmentation, or rare scar following cryotherapy. Recommend Vaseline ointment to treated areas while healing.  Actinic keratoses are precancerous spots that appear secondary to cumulative UV radiation exposure/sun exposure over time. They are chronic with expected duration over 1 year. A portion of actinic keratoses will progress to squamous cell carcinoma of the skin. It is not possible to reliably predict which spots will progress to skin cancer and so treatment is recommended to prevent development of skin cancer.  Recommend daily broad spectrum sunscreen SPF 30+ to sun-exposed areas, reapply every 2 hours as needed.  Recommend staying in the shade or wearing long sleeves, sun glasses (UVA+UVB protection) and wide brim hats (4-inch brim around the entire circumference of the hat). Call for new or changing lesions.   Destruction of lesion - right medial pretibia x1  Destruction method: cryotherapy   Informed consent: discussed and consent obtained   Lesion destroyed using liquid nitrogen: Yes   Outcome: patient tolerated procedure well with no complications   Post-procedure details: wound care instructions given    Neoplasm of skin Right Upper Arm  Skin / nail biopsy Type of biopsy: tangential   Informed consent: discussed and consent obtained   Anesthesia: the lesion was anesthetized in a standard fashion   Anesthesia comment:  Area prepped with alcohol Anesthetic:  1% lidocaine w/ epinephrine 1-100,000 buffered w/ 8.4% NaHCO3 Instrument used: flexible razor  blade   Hemostasis achieved with: pressure, aluminum chloride and electrodesiccation   Outcome: patient tolerated procedure well   Post-procedure details: wound care instructions given   Post-procedure details comment:  Ointment and small bandage applied  Specimen 1 -  Surgical pathology Differential Diagnosis: R/O early SCC  Check Margins: No  AK (actinic keratosis) (3) right preauricular x1, right forehead x1, right lower lip x1  Actinic keratoses are precancerous spots that appear secondary to cumulative UV radiation exposure/sun exposure over time. They are chronic with expected duration over 1 year. A portion of actinic keratoses will progress to squamous cell carcinoma of the skin. It is not possible to reliably predict which spots will progress to skin cancer and so treatment is recommended to prevent development of skin cancer.  Recommend daily broad spectrum sunscreen SPF 30+ to sun-exposed areas, reapply every 2 hours as needed.  Recommend staying in the shade or wearing long sleeves, sun glasses (UVA+UVB protection) and wide brim hats (4-inch brim around the entire circumference of the hat). Call for new or changing lesions.  Prior to procedure, discussed risks of blister formation, small wound, skin dyspigmentation, or rare scar following cryotherapy. Recommend Vaseline ointment to treated areas while healing.   Destruction of lesion - right preauricular x1, right forehead x1, right lower lip x1  Destruction method: cryotherapy   Informed consent: discussed and consent obtained   Lesion destroyed using liquid nitrogen: Yes   Outcome: patient tolerated procedure well with no complications   Post-procedure details: wound care instructions given     Seborrheic Keratoses - Stuck-on, waxy, tan-brown papules and/or plaques  - Benign-appearing - Discussed benign etiology and prognosis. - Observe - Call for any changes  Return for TBSE As Scheduled.  I, Emelia Salisbury, CMA, am acting as scribe for Forest Gleason, MD.  Documentation: I have reviewed the above documentation for accuracy and completeness, and I agree with the above.  Forest Gleason, MD

## 2021-10-04 NOTE — Telephone Encounter (Signed)
I called and spoke with patient and scheduled him for 10/09/21 at 12pm.

## 2021-10-07 ENCOUNTER — Other Ambulatory Visit: Payer: Self-pay | Admitting: *Deleted

## 2021-10-07 DIAGNOSIS — C3490 Malignant neoplasm of unspecified part of unspecified bronchus or lung: Secondary | ICD-10-CM

## 2021-10-08 ENCOUNTER — Telehealth: Payer: Self-pay

## 2021-10-08 DIAGNOSIS — C4492 Squamous cell carcinoma of skin, unspecified: Secondary | ICD-10-CM

## 2021-10-08 HISTORY — DX: Squamous cell carcinoma of skin, unspecified: C44.92

## 2021-10-08 NOTE — Telephone Encounter (Addendum)
°  Patient has been called and notified of biopsy results. Patient also notified we will schedule appointment tomorrow at appointment.     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 Pt prefers excision.   Discussed results and options with patient.   MAs please call and schedule for excision. Thank you!

## 2021-10-09 ENCOUNTER — Other Ambulatory Visit: Payer: Self-pay

## 2021-10-09 ENCOUNTER — Telehealth: Payer: Self-pay | Admitting: Internal Medicine

## 2021-10-09 ENCOUNTER — Ambulatory Visit (INDEPENDENT_AMBULATORY_CARE_PROVIDER_SITE_OTHER): Payer: Medicare Other | Admitting: Dermatology

## 2021-10-09 ENCOUNTER — Telehealth: Payer: Self-pay

## 2021-10-09 DIAGNOSIS — M5033 Other cervical disc degeneration, cervicothoracic region: Secondary | ICD-10-CM | POA: Diagnosis not present

## 2021-10-09 DIAGNOSIS — M9902 Segmental and somatic dysfunction of thoracic region: Secondary | ICD-10-CM | POA: Diagnosis not present

## 2021-10-09 DIAGNOSIS — L570 Actinic keratosis: Secondary | ICD-10-CM | POA: Diagnosis not present

## 2021-10-09 DIAGNOSIS — M9901 Segmental and somatic dysfunction of cervical region: Secondary | ICD-10-CM | POA: Diagnosis not present

## 2021-10-09 DIAGNOSIS — M62830 Muscle spasm of back: Secondary | ICD-10-CM | POA: Diagnosis not present

## 2021-10-09 NOTE — Telephone Encounter (Addendum)
Patient scheduled for surgery today at follow up appointment.   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 Pt prefers excision.   Discussed results and options with patient.   MAs please call and schedule for excision. Thank you!

## 2021-10-09 NOTE — Progress Notes (Signed)
° °  Follow-Up Visit   Subjective  Kevin Terrell is a 81 y.o. male who presents for the following: Follow-up (Patient here today for a possible ak left lower lip. Patient has history of aks. ).  The following portions of the chart were reviewed this encounter and updated as appropriate:  Tobacco   Allergies   Meds   Problems   Med Hx   Surg Hx   Fam Hx       Review of Systems: No other skin or systemic complaints except as noted in HPI or Assessment and Plan.   Objective  Well appearing patient in no apparent distress; mood and affect are within normal limits.  A focused examination was performed including right leg, lip, right upper arm. Relevant physical exam findings are noted in the Assessment and Plan.  right medial pretibia x 1, left lower vermillion border x 1 (2) Erythematous thin papules/macules with gritty scale.    Assessment & Plan  Actinic keratosis (2) right medial pretibia x 1, left lower vermillion border x 1  Will recheck at surgery right medial pretibial. If not clear, consider biopsy  Actinic keratoses are precancerous spots that appear secondary to cumulative UV radiation exposure/sun exposure over time. They are chronic with expected duration over 1 year. A portion of actinic keratoses will progress to squamous cell carcinoma of the skin. It is not possible to reliably predict which spots will progress to skin cancer and so treatment is recommended to prevent development of skin cancer.  Recommend daily broad spectrum sunscreen SPF 30+ to sun-exposed areas, reapply every 2 hours as needed.  Recommend staying in the shade or wearing long sleeves, sun glasses (UVA+UVB protection) and wide brim hats (4-inch brim around the entire circumference of the hat). Call for new or changing lesions.  Destruction of lesion - right medial pretibia x 1, left lower vermillion border x 1  Destruction method: cryotherapy   Informed consent: discussed and consent obtained   Lesion  destroyed using liquid nitrogen: Yes   Cryotherapy cycles:  2 Outcome: patient tolerated procedure well with no complications   Post-procedure details: wound care instructions given   Additional details:  Prior to procedure, discussed risks of blister formation, small wound, skin dyspigmentation, or rare scar following cryotherapy. Recommend Vaseline ointment to treated areas while healing.    Return for schedule surgery for scc at right upper arm . I, Ruthell Rummage, CMA, am acting as scribe for Forest Gleason, MD.  Documentation: I have reviewed the above documentation for accuracy and completeness, and I agree with the above.  Forest Gleason, MD

## 2021-10-09 NOTE — Chronic Care Management (AMB) (Signed)
°  Chronic Care Management   Note  10/09/2021 Name: Ramesh Moan MRN: [id number] DOB: 1941/05/03  Mohamed Portlock is a 81 y.o. year old male who is a primary care patient of [place], Theophilus Kinds, MD. I reached out to [name] by phone today in response to a referral sent by Mr. Zebulin Colocho's PCP, [name], MD.   Mr. Witherington was given information about Chronic Care Management services today including:  CCM service includes personalized support from designated clinical staff supervised by his physician, including individualized plan of care and coordination with other care providers 24/7 contact phone numbers for assistance for urgent and routine care needs. Service will only be billed when office clinical staff spend 20 minutes or more in a month to coordinate care. Only one practitioner may furnish and bill the service in a calendar month. The patient may stop CCM services at any time (effective at the end of the month) by phone call to the office staff.   Patient agreed to services and verbal consent obtained.   Follow up plan:   Tatjana Secretary/administrator

## 2021-10-09 NOTE — Patient Instructions (Addendum)
Call if spot at right pretibial is not resolved in the next few weeks and will recheck at next surgery appointment     Actinic keratoses are precancerous spots that appear secondary to cumulative UV radiation exposure/sun exposure over time. They are chronic with expected duration over 1 year. A portion of actinic keratoses will progress to squamous cell carcinoma of the skin. It is not possible to reliably predict which spots will progress to skin cancer and so treatment is recommended to prevent development of skin cancer.  Recommend daily broad spectrum sunscreen SPF 30+ to sun-exposed areas, reapply every 2 hours as needed.  Recommend staying in the shade or wearing long sleeves, sun glasses (UVA+UVB protection) and wide brim hats (4-inch brim around the entire circumference of the hat). Call for new or changing lesions.   Cryotherapy Aftercare  Wash gently with soap and water everyday.   Apply Vaseline and Band-Aid daily until healed.      If You Need Anything After Your Visit  If you have any questions or concerns for your doctor, please call our main line at [phone] and press option 4 to reach your doctor's medical assistant. If no one answers, please leave a voicemail as directed and we will return your call as soon as possible. Messages left after 4 pm will be answered the following business day.   You may also send [place] a message via [place]. We typically respond to MyChart messages within 1-2 business days.  For prescription refills, please ask your pharmacy to contact our office. Our fax number is [phone].  If you have an urgent issue when the clinic is closed that cannot wait until the next business day, you can page your doctor at the number below.    Please note that while we do our best to be available for urgent issues outside of office hours, we are not available 24/7.   If you have an urgent issue and are unable to reach [place], you may choose to seek medical care  at your doctor's office, retail clinic, urgent care center, or emergency room.  If you have a medical emergency, please immediately call 911 or go to the emergency department.  Pager Numbers  - Dr. Nehemiah Massed: [phone]  - Dr. Laurence Ferrari: [phone]  - Dr. Nicole Kindred: [phone]  In the event of inclement weather, please call our main line at [phone] for an update on the status of any delays or closures.  Dermatology Medication Tips: Please keep the boxes that topical medications come in in order to help keep track of the instructions about where and how to use these. Pharmacies typically print the medication instructions only on the boxes and not directly on the medication tubes.   If your medication is too expensive, please contact our office at [phone] option 4 or send [place] a message through [place].   We are unable to tell what your co-pay for medications will be in advance as this is different depending on your insurance coverage. However, we may be able to find a substitute medication at lower cost or fill out paperwork to get insurance to cover a needed medication.   If a prior authorization is required to get your medication covered by your insurance company, please allow [place] 1-2 business days to complete this process.  Drug prices often vary depending on where the prescription is filled and some pharmacies may offer cheaper prices.  The website www.goodrx.com contains coupons for medications through different pharmacies. The prices here do not  account for what the cost may be with help from insurance (it may be cheaper with your insurance), but the website can give you the price if you did not use any insurance.  - You can print the associated coupon and take it with your prescription to the pharmacy.  - You may also stop by our office during regular business hours and pick up a GoodRx coupon card.  - If you need your prescription sent electronically to a different pharmacy,  notify our office through [facility] or by phone at [phone] option 4.     Si Usted Necesita Algo Despus de Su Visita  Tambin puede enviarnos un mensaje a travs de Pharmacist, community. Por lo general respondemos a los mensajes de MyChart en el transcurso de 1 a 2 das hbiles.  Para renovar recetas, por favor pida a su farmacia que se ponga en contacto con nuestra oficina. Harland Dingwall de fax es [place] [phone].  Si tiene un asunto urgente cuando la clnica est cerrada y que no puede esperar hasta el siguiente da hbil, puede llamar/localizar a su doctor(a) al nmero que aparece a continuacin.   Por favor, tenga en cuenta que aunque hacemos todo lo posible para estar disponibles para asuntos urgentes fuera del horario de [place], no estamos disponibles las 24 horas del da, los 7 das de la [place].   Si tiene un problema urgente y no puede comunicarse con nosotros, puede optar por buscar atencin mdica  en el consultorio de su doctor(a), en una clnica privada, en un centro de atencin urgente o en una sala de emergencias.  Si tiene Engineering geologist, por favor llame inmediatamente al 911 o vaya a la sala de emergencias.  Nmeros de bper  - Dr. Nehemiah Massed: [phone]  - Dra. Moye: [phone]  - Dra. Nicole Kindred: [phone]  En caso de inclemencias del [place], por favor llame a Johnsie Kindred principal al [phone] para una actualizacin sobre el [place] de cualquier retraso o cierre.  Consejos para la medicacin en dermatologa: Por favor, guarde las cajas en las que vienen los medicamentos de uso tpico para ayudarle a seguir las instrucciones sobre dnde y cmo usarlos. Las farmacias generalmente imprimen las instrucciones del medicamento slo en las cajas y no directamente en los tubos del [place].   Si su medicamento es muy caro, por favor, pngase en contacto con Zigmund Daniel llamando al [phone] y presione la opcin 4 o envenos un mensaje a travs de  Pharmacist, community.   No podemos decirle cul ser su copago por los medicamentos por adelantado ya que esto es diferente dependiendo de la cobertura de su seguro. Sin embargo, es posible que podamos encontrar un medicamento sustituto a Electrical engineer un formulario para que el seguro cubra el medicamento que se considera necesario.   Si se requiere una autorizacin previa para que su compaa de seguros [place] su medicamento, por favor permtanos de 1 a 2 das hbiles para completar este proceso.  Los precios de los medicamentos varan con frecuencia dependiendo del Environmental consultant de dnde se surte la receta y alguna farmacias pueden ofrecer precios ms baratos.  El sitio web www.goodrx.com tiene cupones para medicamentos de Airline pilot. Los precios aqu no tienen en cuenta lo que podra costar con la ayuda del seguro (puede ser ms barato con su seguro), pero el sitio web puede darle el precio si no utiliz Research scientist (physical sciences).  - Puede imprimir el cupn correspondiente y llevarlo con su receta a la farmacia.  -  Tambin puede pasar por nuestra oficina durante el horario de atencin regular y Charity fundraiser una tarjeta de cupones de GoodRx.  - Si necesita que su receta se enve electrnicamente a una farmacia diferente, informe a nuestra oficina a travs de MyChart de [place] o por telfono llamando al [phone] y presione la opcin 4.

## 2021-10-10 ENCOUNTER — Telehealth: Payer: Self-pay | Admitting: *Deleted

## 2021-10-10 ENCOUNTER — Encounter: Payer: Self-pay | Admitting: Dermatology

## 2021-10-11 NOTE — Telephone Encounter (Signed)
done

## 2021-10-15 DIAGNOSIS — M62830 Muscle spasm of back: Secondary | ICD-10-CM | POA: Diagnosis not present

## 2021-10-15 DIAGNOSIS — M9902 Segmental and somatic dysfunction of thoracic region: Secondary | ICD-10-CM | POA: Diagnosis not present

## 2021-10-15 DIAGNOSIS — M5033 Other cervical disc degeneration, cervicothoracic region: Secondary | ICD-10-CM | POA: Diagnosis not present

## 2021-10-15 DIAGNOSIS — M9901 Segmental and somatic dysfunction of cervical region: Secondary | ICD-10-CM | POA: Diagnosis not present

## 2021-10-16 ENCOUNTER — Encounter: Payer: Self-pay | Admitting: Dermatology

## 2021-10-18 ENCOUNTER — Other Ambulatory Visit: Payer: Self-pay

## 2021-10-18 ENCOUNTER — Inpatient Hospital Stay: Payer: Medicare Other | Attending: Oncology

## 2021-10-18 DIAGNOSIS — Z8249 Family history of ischemic heart disease and other diseases of the circulatory system: Secondary | ICD-10-CM | POA: Diagnosis not present

## 2021-10-18 DIAGNOSIS — K7689 Other specified diseases of liver: Secondary | ICD-10-CM | POA: Diagnosis not present

## 2021-10-18 DIAGNOSIS — Z818 Family history of other mental and behavioral disorders: Secondary | ICD-10-CM | POA: Diagnosis not present

## 2021-10-18 DIAGNOSIS — Z8551 Personal history of malignant neoplasm of bladder: Secondary | ICD-10-CM | POA: Insufficient documentation

## 2021-10-18 DIAGNOSIS — M5033 Other cervical disc degeneration, cervicothoracic region: Secondary | ICD-10-CM | POA: Diagnosis not present

## 2021-10-18 DIAGNOSIS — K219 Gastro-esophageal reflux disease without esophagitis: Secondary | ICD-10-CM | POA: Diagnosis not present

## 2021-10-18 DIAGNOSIS — Z85828 Personal history of other malignant neoplasm of skin: Secondary | ICD-10-CM | POA: Diagnosis not present

## 2021-10-18 DIAGNOSIS — D1803 Hemangioma of intra-abdominal structures: Secondary | ICD-10-CM | POA: Diagnosis not present

## 2021-10-18 DIAGNOSIS — C3411 Malignant neoplasm of upper lobe, right bronchus or lung: Secondary | ICD-10-CM | POA: Diagnosis not present

## 2021-10-18 DIAGNOSIS — Z809 Family history of malignant neoplasm, unspecified: Secondary | ICD-10-CM | POA: Diagnosis not present

## 2021-10-18 DIAGNOSIS — M9901 Segmental and somatic dysfunction of cervical region: Secondary | ICD-10-CM | POA: Diagnosis not present

## 2021-10-18 DIAGNOSIS — Z853 Personal history of malignant neoplasm of breast: Secondary | ICD-10-CM | POA: Diagnosis not present

## 2021-10-18 DIAGNOSIS — C3490 Malignant neoplasm of unspecified part of unspecified bronchus or lung: Secondary | ICD-10-CM

## 2021-10-18 DIAGNOSIS — Z902 Acquired absence of lung [part of]: Secondary | ICD-10-CM | POA: Insufficient documentation

## 2021-10-18 DIAGNOSIS — M9902 Segmental and somatic dysfunction of thoracic region: Secondary | ICD-10-CM | POA: Diagnosis not present

## 2021-10-18 DIAGNOSIS — E039 Hypothyroidism, unspecified: Secondary | ICD-10-CM | POA: Insufficient documentation

## 2021-10-18 DIAGNOSIS — Z9049 Acquired absence of other specified parts of digestive tract: Secondary | ICD-10-CM | POA: Diagnosis not present

## 2021-10-18 DIAGNOSIS — Z888 Allergy status to other drugs, medicaments and biological substances status: Secondary | ICD-10-CM | POA: Insufficient documentation

## 2021-10-18 DIAGNOSIS — M62830 Muscle spasm of back: Secondary | ICD-10-CM | POA: Diagnosis not present

## 2021-10-18 DIAGNOSIS — Z823 Family history of stroke: Secondary | ICD-10-CM | POA: Diagnosis not present

## 2021-10-18 DIAGNOSIS — Z79899 Other long term (current) drug therapy: Secondary | ICD-10-CM | POA: Diagnosis not present

## 2021-10-18 DIAGNOSIS — K5730 Diverticulosis of large intestine without perforation or abscess without bleeding: Secondary | ICD-10-CM | POA: Insufficient documentation

## 2021-10-18 DIAGNOSIS — K4020 Bilateral inguinal hernia, without obstruction or gangrene, not specified as recurrent: Secondary | ICD-10-CM | POA: Insufficient documentation

## 2021-10-18 LAB — COMPREHENSIVE METABOLIC PANEL
ALT: 16 U/L (ref 0–44)
AST: 18 U/L (ref 15–41)
Albumin: 3.7 g/dL (ref 3.5–5.0)
Alkaline Phosphatase: 61 U/L (ref 38–126)
Anion gap: 6 (ref 5–15)
BUN: 24 mg/dL — ABNORMAL HIGH (ref 8–23)
CO2: 30 mmol/L (ref 22–32)
Calcium: 9 mg/dL (ref 8.9–10.3)
Chloride: 101 mmol/L (ref 98–111)
Creatinine, Ser: 0.74 mg/dL (ref 0.61–1.24)
GFR, Estimated: >60 mL/min (ref >60)
Glucose, Bld: 101 mg/dL — ABNORMAL HIGH (ref 70–99)
Potassium: 4.6 mmol/L (ref 3.5–5.1)
Sodium: 137 mmol/L (ref 135–145)
Total Bilirubin: <0.1 mg/dL — ABNORMAL LOW (ref 0.3–1.2)
Total Protein: 6.8 g/dL (ref 6.5–8.1)

## 2021-10-18 LAB — CBC WITH DIFFERENTIAL/PLATELET
Abs Immature Granulocytes: 0.01 10*3/uL (ref 0.00–0.07)
Basophils Absolute: 0 10*3/uL (ref 0.0–0.1)
Basophils Relative: 0 %
Eosinophils Absolute: 0.2 10*3/uL (ref 0.0–0.5)
Eosinophils Relative: 3 %
HCT: 40.1 % (ref 39.0–52.0)
Hemoglobin: 12.9 g/dL — ABNORMAL LOW (ref 13.0–17.0)
Immature Granulocytes: 0 %
Lymphocytes Relative: 19 %
Lymphs Abs: 1.5 10*3/uL (ref 0.7–4.0)
MCH: 30.5 pg (ref 26.0–34.0)
MCHC: 32.2 g/dL (ref 30.0–36.0)
MCV: 94.8 fL (ref 80.0–100.0)
Monocytes Absolute: 0.9 10*3/uL (ref 0.1–1.0)
Monocytes Relative: 12 %
Neutro Abs: 5.1 10*3/uL (ref 1.7–7.7)
Neutrophils Relative %: 66 %
Platelets: 168 10*3/uL (ref 150–400)
RBC: 4.23 MIL/uL (ref 4.22–5.81)
RDW: 12.7 % (ref 11.5–15.5)
WBC: 7.8 10*3/uL (ref 4.0–10.5)
nRBC: 0 % (ref 0.0–0.2)

## 2021-10-21 ENCOUNTER — Other Ambulatory Visit: Payer: Medicare Other

## 2021-10-22 ENCOUNTER — Other Ambulatory Visit: Payer: Self-pay

## 2021-10-22 ENCOUNTER — Ambulatory Visit: Payer: Medicare Other

## 2021-10-22 ENCOUNTER — Other Ambulatory Visit: Payer: Self-pay | Admitting: *Deleted

## 2021-10-22 ENCOUNTER — Ambulatory Visit
Admission: RE | Admit: 2021-10-22 | Discharge: 2021-10-22 | Disposition: A | Discharge status: Home / Self Care | Payer: Medicare Other | Source: Ambulatory Visit | Attending: Oncology | Admitting: Oncology

## 2021-10-22 DIAGNOSIS — K5730 Diverticulosis of large intestine without perforation or abscess without bleeding: Secondary | ICD-10-CM | POA: Diagnosis not present

## 2021-10-22 DIAGNOSIS — C3490 Malignant neoplasm of unspecified part of unspecified bronchus or lung: Secondary | ICD-10-CM | POA: Diagnosis not present

## 2021-10-22 DIAGNOSIS — I700 Atherosclerosis of aorta: Secondary | ICD-10-CM | POA: Diagnosis not present

## 2021-10-22 DIAGNOSIS — R935 Abnormal findings on diagnostic imaging of other abdominal regions, including retroperitoneum: Secondary | ICD-10-CM | POA: Diagnosis not present

## 2021-10-22 DIAGNOSIS — K4020 Bilateral inguinal hernia, without obstruction or gangrene, not specified as recurrent: Secondary | ICD-10-CM | POA: Diagnosis not present

## 2021-10-22 LAB — POCT I-STAT CREATININE: Creatinine, Ser: 0.8 mg/dL (ref 0.61–1.24)

## 2021-10-22 IMAGING — CT CT ABD-PELV W/ CM
2 of 5 series · 15 of 46 positions shown, 17 images · IV contrast (agent unspecified)
Comparison: CT abdomen pelvis [DATE], MRI abdomen [DATE]

CLINICAL DATA: Pain near umbilicus x a month or so; iguinal area
pain; hx of lung ca [XE]; lobectomy and chemo; cholecystectomy and
iguinal hernia repair x 2 in [XE]; f/u to CT approx 6 months

EXAM:
CT ABDOMEN AND PELVIS WITH CONTRAST
TECHNIQUE: Multidetector CT imaging of the abdomen and pelvis was performed
using the standard protocol following bolus administration of
intravenous contrast.

[Series 2: abd pelvis 5.00 · axial · 0.69mm/px · z∈[-1601,-1161]mm · 12 of 100 slices shown, 14 images]
[im 6/100  soft-tissue]
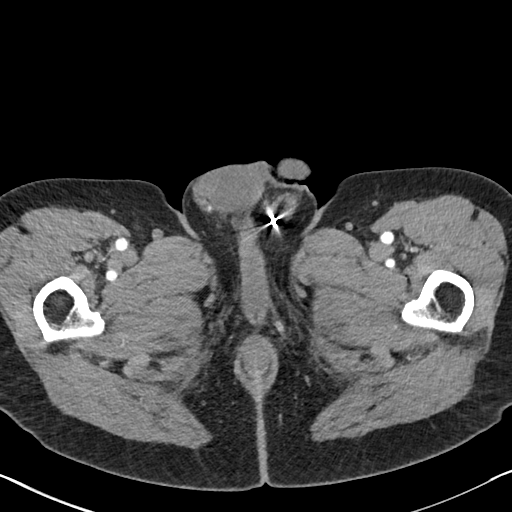
[im 6/100  bone]
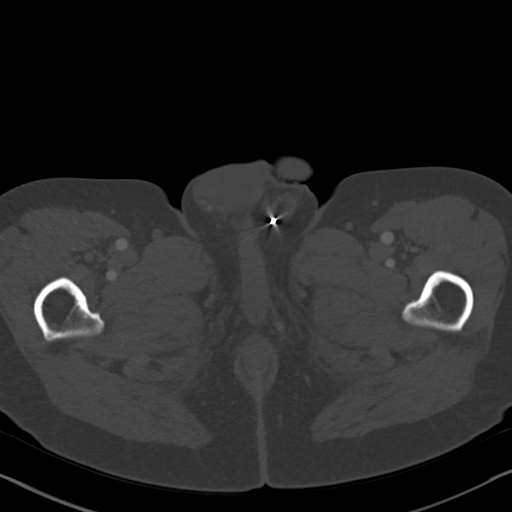
[im 17/100  soft-tissue]
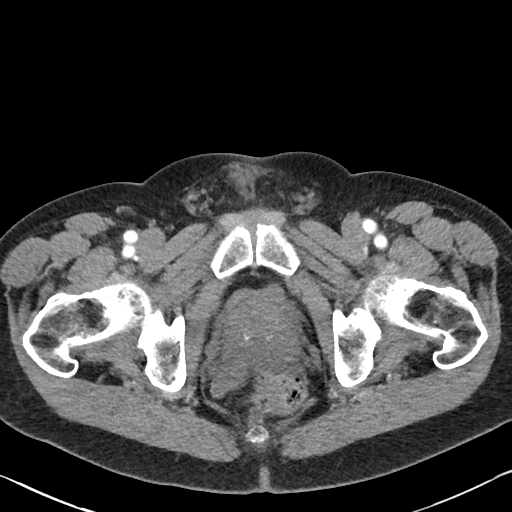
[im 23/100  soft-tissue]
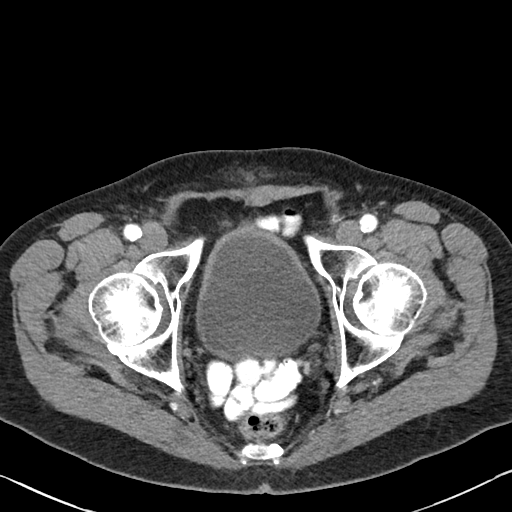
[im 28/100  soft-tissue]
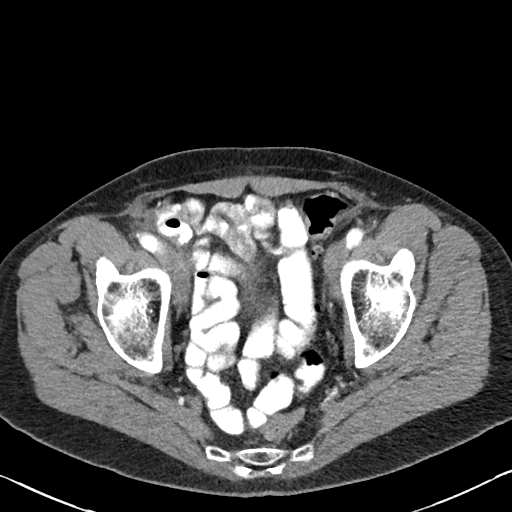
[im 39/100  soft-tissue]
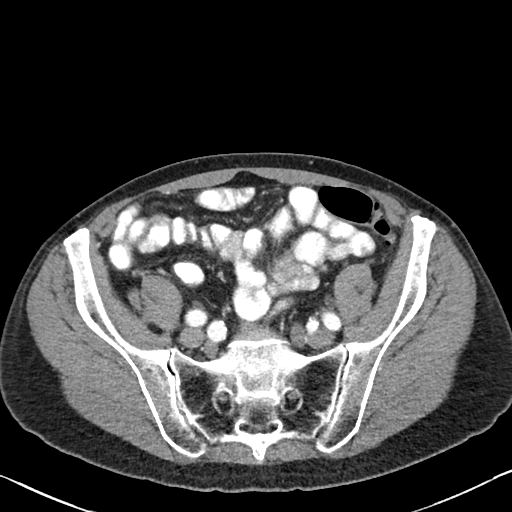
[im 45/100  soft-tissue]
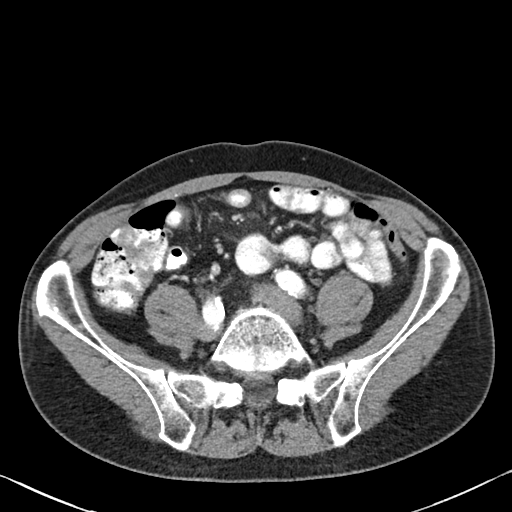
[im 56/100  soft-tissue]
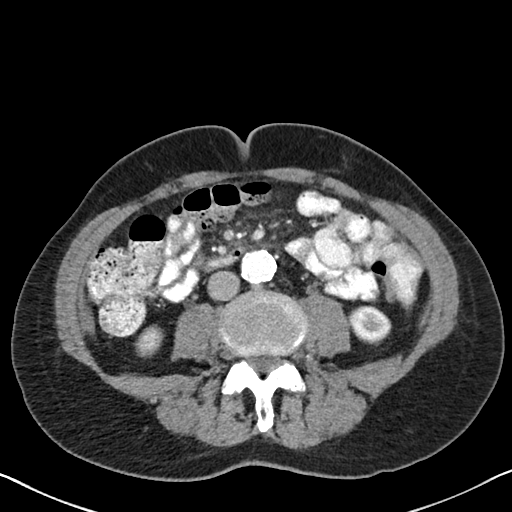
[im 61/100  soft-tissue]
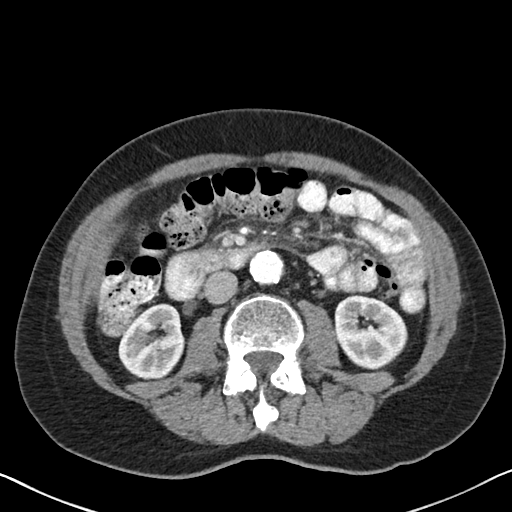
[im 72/100  soft-tissue]
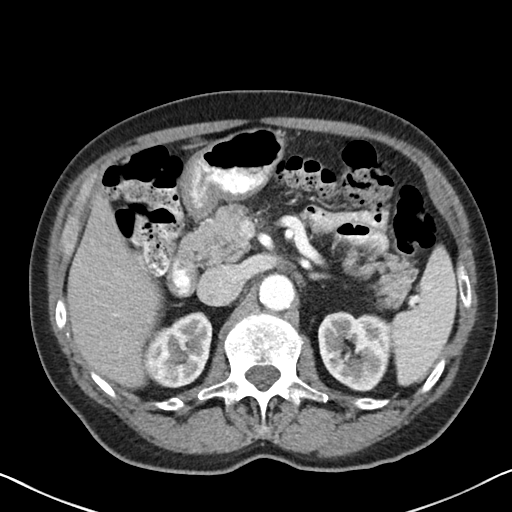
[im 72/100  bone]
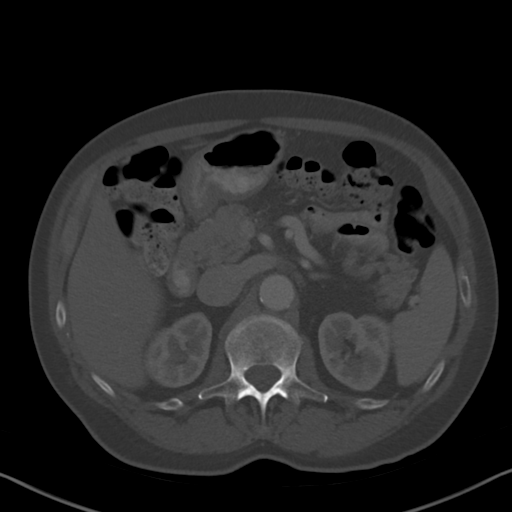
[im 78/100  soft-tissue]
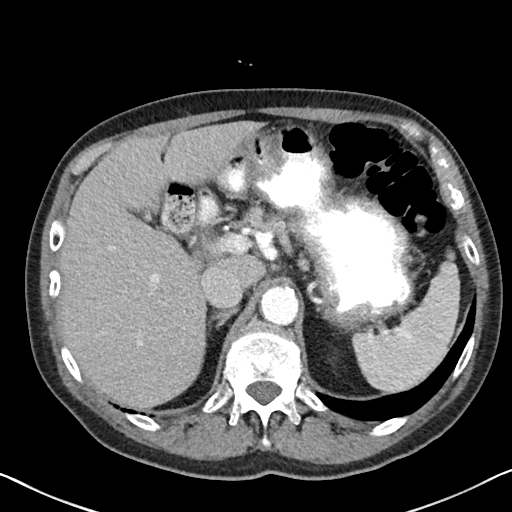
[im 83/100  soft-tissue]
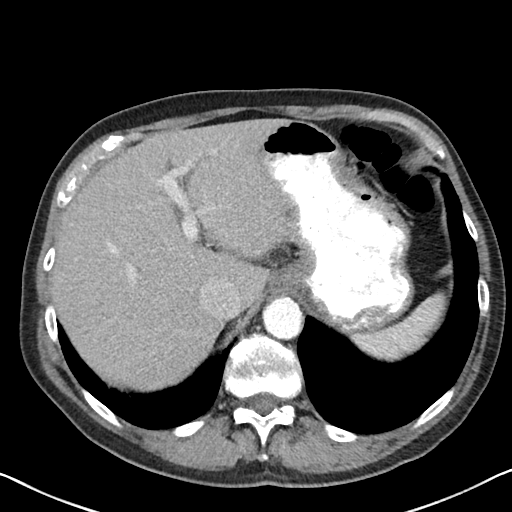
[im 94/100  soft-tissue]
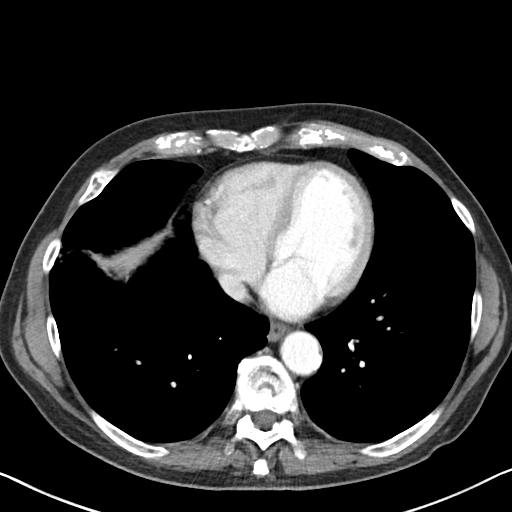

[Series 4: coronals abd pelvis 2.00 cor · coronal · 0.69mm/px · 3 of 144 slices shown]
[im 48/144  soft-tissue]
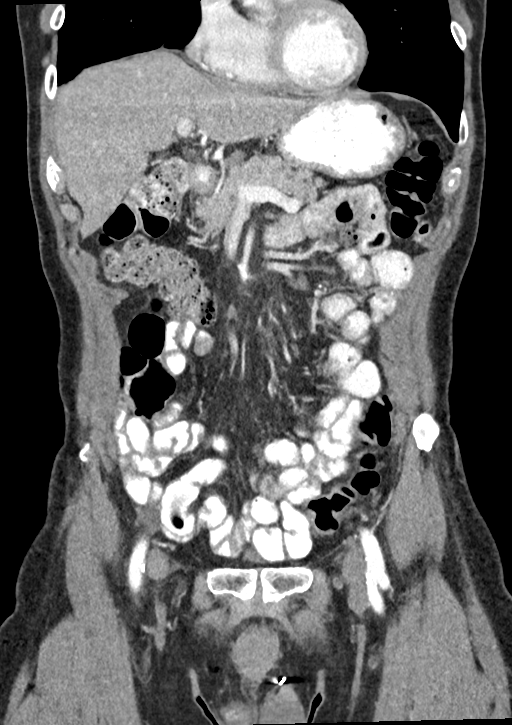
[im 64/144  soft-tissue]
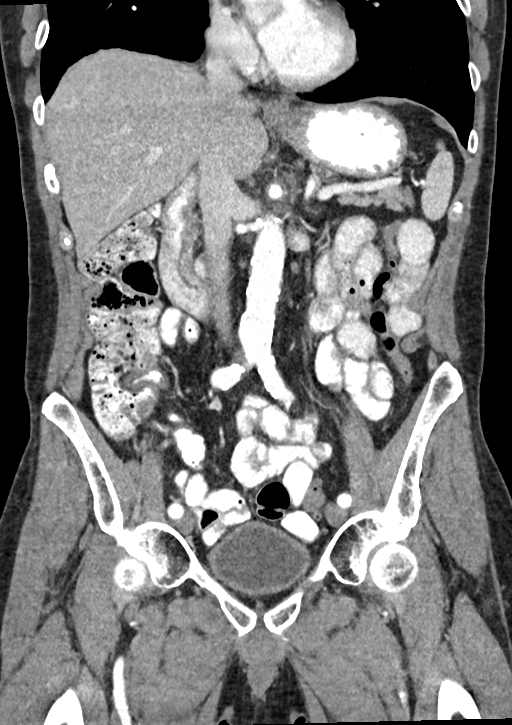
[im 80/144  soft-tissue]
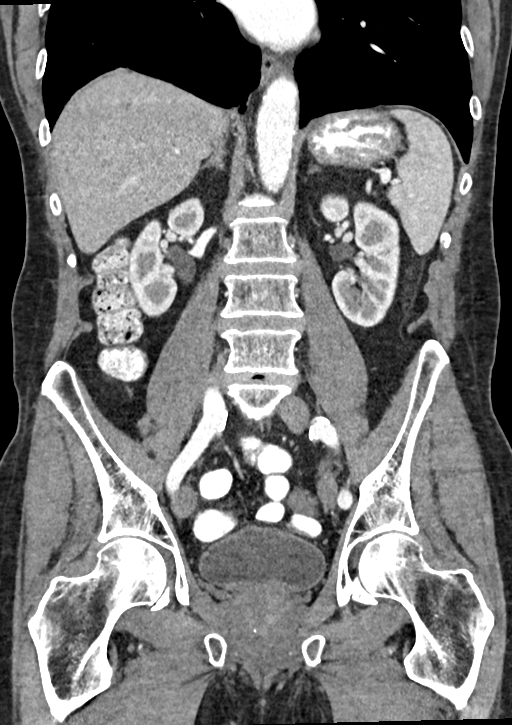

[15 of 46 positions shown; findings below may reference images not displayed]

RADIATION DOSE REDUCTION: This exam was performed according to the
departmental dose-optimization program which includes automated
exposure control, adjustment of the mA and/or kV according to
patient size and/or use of iterative reconstruction technique.

CONTRAST:  100mL OMNIPAQUE IOHEXOL 300 MG/ML  SOLN
FINDINGS: Lower chest: Right base scarring versus atelectasis no acute
abnormality.

Hepatobiliary: Grossly stable to slightly decreased in size right
posterior hepatic lobe 2.5 x 1 cm hypodense lesion (from 2.5 x
cm) consistent with known hepatic hemangioma. Redemonstration of a
grossly stable 1.4 x 0.8 cm fluid density lesion within left hepatic
lobe that likely represents a simple hepatic cyst. Redemonstration
of several other scattered subcentimeter hypodensities. No new focal
hepatic lesion. No gallstones, gallbladder wall thickening, or
pericholecystic fluid. No biliary dilatation.

Pancreas: No focal lesion. Normal pancreatic contour. No surrounding
inflammatory changes. No main pancreatic ductal dilatation.

Spleen: Normal in size without focal abnormality.

Adrenals/Urinary Tract:

No adrenal nodule bilaterally.

Bilateral kidneys enhance symmetrically.

No hydronephrosis. No hydroureter.

The urinary bladder is unremarkable.

Stomach/Bowel: Stomach is within normal limits. No evidence of bowel
wall thickening or dilatation. Stool within the ascending and
transverse colon. Scattered sigmoid diverticulosis. Appendix appears
normal.

Vascular/Lymphatic: No abdominal aorta or iliac aneurysm. The common
iliac arteries measure at the upper limits of normal. At least
moderate atherosclerotic plaque of the aorta and its branches. No
abdominal, pelvic, or inguinal lymphadenopathy.

Reproductive: Prostate is unremarkable. Likely vasectomy changes
within bilateral testes.

Other: No intraperitoneal free fluid. No intraperitoneal free gas.
No organized fluid collection.

Musculoskeletal:

Bilateral inguinal hernia surgical changes again noted ([DATE]). No
abdominal wall hernia or abnormality.

No suspicious lytic or blastic osseous lesions. No acute displaced
fracture. Multilevel degenerative changes of the spine. Chronic
vertebral body height loss of the L5 level.
IMPRESSION: 1. Scattered colonic diverticulosis with no acute diverticulitis.
2.  Aortic Atherosclerosis ([XE]-[XE]).

## 2021-10-22 MED ORDER — ROSUVASTATIN CALCIUM 5 MG PO TABS: 5.0000 mg | ORAL_TABLET | Freq: Every day | ORAL | 2 refills | Status: DC

## 2021-10-22 MED ORDER — IOHEXOL 300 MG/ML  SOLN
100.0000 mL | Freq: Once | INTRAMUSCULAR | Status: AC | PRN
  Administered 2021-10-22: 100 mL via INTRAVENOUS

## 2021-10-23 DIAGNOSIS — M9901 Segmental and somatic dysfunction of cervical region: Secondary | ICD-10-CM | POA: Diagnosis not present

## 2021-10-23 DIAGNOSIS — M5033 Other cervical disc degeneration, cervicothoracic region: Secondary | ICD-10-CM | POA: Diagnosis not present

## 2021-10-23 DIAGNOSIS — Z20822 Contact with and (suspected) exposure to covid-19: Secondary | ICD-10-CM | POA: Diagnosis not present

## 2021-10-23 DIAGNOSIS — M9902 Segmental and somatic dysfunction of thoracic region: Secondary | ICD-10-CM | POA: Diagnosis not present

## 2021-10-23 DIAGNOSIS — M62830 Muscle spasm of back: Secondary | ICD-10-CM | POA: Diagnosis not present

## 2021-10-25 ENCOUNTER — Encounter: Payer: Self-pay | Admitting: Oncology

## 2021-10-25 ENCOUNTER — Inpatient Hospital Stay (HOSPITAL_BASED_OUTPATIENT_CLINIC_OR_DEPARTMENT_OTHER): Payer: Medicare Other | Admitting: Oncology

## 2021-10-25 ENCOUNTER — Other Ambulatory Visit: Payer: Self-pay

## 2021-10-25 DIAGNOSIS — R935 Abnormal findings on diagnostic imaging of other abdominal regions, including retroperitoneum: Secondary | ICD-10-CM | POA: Diagnosis not present

## 2021-10-25 DIAGNOSIS — C3490 Malignant neoplasm of unspecified part of unspecified bronchus or lung: Secondary | ICD-10-CM

## 2021-10-25 MED ORDER — ROSUVASTATIN CALCIUM 5 MG PO TABS: 5.0000 mg | ORAL_TABLET | Freq: Every day | ORAL | 0 refills | Status: DC

## 2021-10-25 NOTE — Progress Notes (Deleted)
Patient states

## 2021-10-25 NOTE — Telephone Encounter (Signed)
*  STAT* If patient is at the pharmacy, call can be transferred to refill team.   1. Which medications need to be refilled? (please list name of each medication and dose if known) Crestor  2. Which pharmacy/location (including street and city if local pharmacy) is medication to be sent to?[place] [facility]  3. Do they need a 30 day or 90 day supply? [place]

## 2021-10-27 NOTE — Progress Notes (Signed)
I connected with Kevin Terrell on 10/27/21 at  2:45 PM EST by video enabled telemedicine visit and verified that I am speaking with the correct person using two identifiers.   I discussed the limitations, risks, security and privacy concerns of performing an evaluation and management service by telemedicine and the availability of in-person appointments. I also discussed with the patient that there may be a patient responsible charge related to this service. The patient expressed understanding and agreed to proceed.  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  He is s/p right upper lobectomy and underwent 4 cycles of adjuvant chemotherapy back then.  This is as per patient history and I do not have any oncology records from back then.  Patient states that he required chemotherapy as he was found to have a high mitotic rate in his final pathology but the size of the final tumor was a little over 2 cm with negative nodes.  Patient has been in remission since then and was getting surveillance CT scans.  Last scan was done in 2019 which did not show any evidence of recurrence.    He did not require follow-up subsequently with oncology and he has been getting yearly low-dose screening Cts   patient was admitted for gallstone pancreatitis in March 2022 requiring emergency cholecystectomy.  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  Interval history patient is doing well and denies any specific complaints at this time.  Denies any  abdominal pain nausea or vomiting.  Appetite and weight have been stable.   Review of Systems  Constitutional:  Negative for chills, fever, malaise/fatigue and weight loss.  HENT:  Negative for congestion, ear discharge and nosebleeds.   Eyes:  Negative for blurred vision.  Respiratory:  Negative for cough, hemoptysis, sputum production, shortness of breath and wheezing.   Cardiovascular:  Negative for chest pain, palpitations, orthopnea and claudication.  Gastrointestinal:  Negative for abdominal pain, blood in stool, constipation, diarrhea, heartburn, melena, nausea and vomiting.  Genitourinary:  Negative for dysuria, flank pain, frequency, hematuria and urgency.  Musculoskeletal:  Negative for back pain, joint pain and myalgias.  Skin:  Negative for rash.  Neurological:  Negative for dizziness, tingling, focal weakness, seizures, weakness and headaches.  Endo/Heme/Allergies:  Does not bruise/bleed easily.  Psychiatric/Behavioral:  Negative for depression and suicidal ideas. The patient does not have insomnia.    Allergies  Allergen Reactions   Compazine [Prochlorperazine Edisylate]     muscle spasmus    Other     Okra - hives    Past Medical History:  Diagnosis Date   Actinic keratosis    Arthritis    Chronic allergic rhinitis due to pollen    GERD (gastroesophageal reflux disease)    with atypical cough   Hypothyroidism    Lung cancer ([place]) 2009   chemo, NO radiation. 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   UPPER GI ENDOSCOPY      Social History   Socioeconomic History   Marital status: Married    Spouse name: Not on file   Number of children: 3   Years of education: Not on file   Highest education level: Not on file  Occupational History   Occupation: Animal nutritionist    Comment: retired  Tobacco Use   Smoking status: Never   Smokeless tobacco: Never  Vaping Use   Vaping Use: Never used  Substance and Sexual Activity   Alcohol use: Yes    Comment: Once glass of wine per month, none last 24hrs   Drug use: Never   Sexual activity: Yes  Other Topics Concern   Not on file  Social History Narrative   Married   Animal nutritionist   Wife is nurse    Has a daughter in [place]; son and daughter in [place]    6 grandchildren   [place] from [place]; spent some time in [place]; moved here 2019.    Lives at [facility]      Has advanced directives   Daughter [name] is health care POA   Would accept resuscitation attempts   No feeding tube if cognitively unaware   Social Determinants of Health   Financial Resource Strain: Not on file  Food Insecurity: Not on file  Transportation Needs: Not on file  Physical Activity: Not on file  Stress: Not on file  Social Connections: Not on file  Intimate Partner Violence: Not on file    Family History  Problem Relation Age of Onset   Cancer Mother    Alzheimer's disease Mother    Stroke Maternal Grandmother    Hypertension Father    Alzheimer's disease Father      Current Outpatient Medications:    albuterol (VENTOLIN HFA) 108 (90 Base) MCG/ACT inhaler, Inhale 2 puffs into the lungs every 6 (six) hours as needed for wheezing or shortness of breath., Disp: 8 g, Rfl: 0   cetirizine (ZYRTEC) 10 MG tablet, Take 10 mg by mouth daily., Disp: , Rfl:    cholecalciferol (VITAMIN D3) 25 MCG (1000 UT) tablet, Take 1,000 Units by  mouth daily., Disp: , Rfl:    diclofenac Sodium (VOLTAREN) 1 % GEL, APPLY 4GM TO BOTH KNEES 4 TIMES DAILY ASNEEDED, Disp: 200 g, Rfl: 11   famotidine (PEPCID) 20 MG tablet, TAKE 1 TABLET BY MOUTH EVERY NIGHT AT BEDTIME, Disp: 90 tablet, Rfl: 3   fluticasone (FLONASE) 50 MCG/ACT nasal spray, Place 1 spray into both nostrils 2 (two) times daily., Disp: , Rfl:    gabapentin (NEURONTIN) 300 MG capsule, TAKE 1 CAPSULE BY MOUTH TWICE DAILY, Disp: 180 capsule, Rfl: 3   levothyroxine (SYNTHROID) 50 MCG tablet, TAKE 1 TABLET BY MOUTH ONCE A DAY BEFOREBREAKFAST., Disp: 90 tablet, Rfl: 3   meloxicam (MOBIC) 15 MG tablet, Take 1 tablet (15 mg total) by mouth daily. As needed, Disp: 90 tablet, Rfl: 3   montelukast (SINGULAIR) 10 MG tablet, TAKE 1 TABLET BY MOUTH  EVERY NIGHT AT BEDTIME, Disp: 90 tablet, Rfl: 3   Multiple Vitamin (MULTIVITAMIN WITH MINERALS) TABS tablet, Take 1 tablet by mouth daily., Disp: , Rfl:    mupirocin ointment (BACTROBAN) 2 %, Apply 1 application topically 3 (three) times daily., Disp: 22 g, Rfl: 0   Probiotic Product (PROBIOTIC PO), Take 1 capsule by mouth 2 (two) times daily., Disp: , Rfl:    rosuvastatin (CRESTOR) 5 MG tablet, Take 1 tablet (5 mg total) by mouth daily., Disp: 30 tablet, Rfl: 0   triamcinolone ointment (KENALOG) 0.1 %, APPLY TO HANDS AT BEDTIME AND COVER WITHGLOVES AS NEEDED FOR FLARES. AVOID FACE/GROIN/AXILLA., Disp: 80 g, Rfl: 0   benzonatate (TESSALON PERLES) 100 MG capsule, Take 1 capsule (100 mg total) by mouth 3 (three) times daily as needed. (Patient not taking: Reported on 10/25/2021), Disp: 20 capsule, Rfl: 0  CT Abdomen Pelvis W Contrast  Result Date: 10/24/2021 CLINICAL DATA:  Pain near umbilicus x a month or so; iguinal area pain; hx of lung ca 2009; lobectomy and chemo; cholecystectomy and iguinal hernia repair x 2 in 2015; f/u to CT approx 6 months EXAM: CT ABDOMEN AND PELVIS WITH CONTRAST TECHNIQUE: Multidetector CT imaging of the abdomen and pelvis was  performed using the standard protocol following bolus administration of intravenous contrast. RADIATION DOSE REDUCTION: This exam was performed according to the departmental dose-optimization program which includes automated exposure control, adjustment of the mA and/or kV according to patient size and/or use of iterative reconstruction technique. CONTRAST:  162mL OMNIPAQUE IOHEXOL 300 MG/ML  SOLN COMPARISON:  CT abdomen pelvis 04/17/2021, MRI abdomen 11/01/2020 FINDINGS: Lower chest: Right base scarring versus atelectasis no acute abnormality. Hepatobiliary: Grossly stable to slightly decreased in size right posterior hepatic lobe 2.5 x 1 cm hypodense lesion (from 2.5 x 1.8 cm) consistent with known hepatic hemangioma. Redemonstration of a grossly stable 1.4 x 0.8 cm fluid density lesion within left hepatic lobe that likely represents a simple hepatic cyst. Redemonstration of several other scattered subcentimeter hypodensities. No new focal hepatic lesion. No gallstones, gallbladder wall thickening, or pericholecystic fluid. No biliary dilatation. Pancreas: No focal lesion. Normal pancreatic contour. No surrounding inflammatory changes. No main pancreatic ductal dilatation. Spleen: Normal in size without focal abnormality. Adrenals/Urinary Tract: No adrenal nodule bilaterally. Bilateral kidneys enhance symmetrically. No hydronephrosis. No hydroureter. The urinary bladder is unremarkable. Stomach/Bowel: Stomach is within normal limits. No evidence of bowel wall thickening or dilatation. Stool within the ascending and transverse colon. Scattered sigmoid diverticulosis. Appendix appears normal. Vascular/Lymphatic: No abdominal aorta or iliac aneurysm. The common iliac arteries measure at the upper limits of normal. At least moderate atherosclerotic plaque of the aorta and its branches. No abdominal, pelvic, or inguinal lymphadenopathy. Reproductive: Prostate is unremarkable. Likely vasectomy changes within bilateral  testes. Other: No intraperitoneal free fluid. No intraperitoneal free gas. No organized fluid collection. Musculoskeletal: Bilateral inguinal hernia surgical changes again noted (2:73). No abdominal wall hernia or abnormality. No suspicious lytic or blastic osseous lesions. No acute displaced fracture. Multilevel degenerative changes of the spine. Chronic vertebral body height loss of the L5 level. IMPRESSION: 1. Scattered colonic diverticulosis with no acute diverticulitis. 2.  Aortic Atherosclerosis (ICD10-I70.0). Electronically Signed   By: Iven Finn M.D.   On: 10/24/2021 00:19    No images are attached to the encounter.   CMP Latest Ref Rng & Units 10/22/2021  Glucose 70 - 99 mg/dL -  BUN 8 - 23 mg/dL -  Creatinine 0.61 - 1.24 mg/dL 0.80  Sodium 135 -  145 mmol/L -  Potassium 3.5 - 5.1 mmol/L -  Chloride 98 - 111 mmol/L -  CO2 22 - 32 mmol/L -  Calcium 8.9 - 10.3 mg/dL -  Total Protein 6.5 - 8.1 g/dL -  Total Bilirubin 0.3 - 1.2 mg/dL -  Alkaline Phos 38 - 126 U/L -  AST 15 - 41 U/L -  ALT 0 - 44 U/L -   CBC Latest Ref Rng & Units 10/18/2021  WBC 4.0 - 10.5 K/uL 7.8  Hemoglobin 13.0 - 17.0 g/dL 12.9(L)  Hematocrit 39.0 - 52.0 % 40.1  Platelets 150 - 400 K/uL 168     Observation/objective: Appears in no acute distress over video visit today.  Breathing is nonlabored  Assessment and plan: Patient is a 81 year old male with a prior history of breast cancer and this is a routine follow-up visit for following issues:  Abnormal CT abdomen findings in March 2022 which showed retroperitoneal haziness concerning for lymphoproliferative disorder.  This was subsequently followed up on a CT in August 2022 which showed improvement in these findings overall feel it favoring an inflammatory process.CT abdomen and pelvis from February 2023 shows resolution of retroperitoneal haziness.  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  No other acute findings.  He does not require any further  surveillance CT abdomens moving forward. 2.  History of bladder cancer in 2009: Continue yearly CT scans will refer to lung cancer screening program in the past   Follow-up instructions: No follow-up needed with me  I discussed the assessment and treatment plan with the patient. The patient was provided an opportunity to ask questions and all were answered. The patient agreed with the plan and demonstrated an understanding of the instructions.   The patient was advised to call back or seek an in-person evaluation if the symptoms worsen or if the condition fails to improve as anticipated.  Visit Diagnosis: 1. Malignant neoplasm of lung, unspecified laterality, unspecified part of lung ([facility])     Dr. Randa Evens, MD, MPH [facility] at [facility] Tel- [phone] 10/27/2021 9:27 AM

## 2021-10-29 ENCOUNTER — Ambulatory Visit: Payer: Medicare Other | Admitting: Dermatology

## 2021-10-29 DIAGNOSIS — M5033 Other cervical disc degeneration, cervicothoracic region: Secondary | ICD-10-CM | POA: Diagnosis not present

## 2021-10-29 DIAGNOSIS — M9901 Segmental and somatic dysfunction of cervical region: Secondary | ICD-10-CM | POA: Diagnosis not present

## 2021-10-29 DIAGNOSIS — M9902 Segmental and somatic dysfunction of thoracic region: Secondary | ICD-10-CM | POA: Diagnosis not present

## 2021-10-29 DIAGNOSIS — M62830 Muscle spasm of back: Secondary | ICD-10-CM | POA: Diagnosis not present

## 2021-10-30 ENCOUNTER — Ambulatory Visit (INDEPENDENT_AMBULATORY_CARE_PROVIDER_SITE_OTHER): Payer: Medicare Other | Admitting: Dermatology

## 2021-10-30 ENCOUNTER — Encounter: Payer: Self-pay | Admitting: Dermatology

## 2021-10-30 ENCOUNTER — Other Ambulatory Visit: Payer: Self-pay

## 2021-10-30 DIAGNOSIS — R21 Rash and other nonspecific skin eruption: Secondary | ICD-10-CM

## 2021-10-30 DIAGNOSIS — C44622 Squamous cell carcinoma of skin of right upper limb, including shoulder: Secondary | ICD-10-CM

## 2021-10-30 DIAGNOSIS — L988 Other specified disorders of the skin and subcutaneous tissue: Secondary | ICD-10-CM | POA: Diagnosis not present

## 2021-10-30 DIAGNOSIS — D485 Neoplasm of uncertain behavior of skin: Secondary | ICD-10-CM

## 2021-10-30 MED ORDER — MUPIROCIN 2 % EX OINT: 1.0000 "application " | TOPICAL_OINTMENT | Freq: Every day | CUTANEOUS | 0 refills | Status: DC

## 2021-10-30 NOTE — Progress Notes (Signed)
? ?Follow-Up Visit ?  ?Subjective  ?Kevin Terrell is a 81 y.o. 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 ). ? ?The following portions of the chart were reviewed this encounter and updated as appropriate:  ? Tobacco  Allergies  Meds  Problems  Med Hx  Surg Hx  Fam Hx   ?  ? ?Review of Systems:  No other skin or systemic complaints except as noted in HPI or Assessment and Plan. ? ?Objective  ?Well appearing patient in no apparent distress; mood and affect are within normal limits. ? ?A focused examination was performed including right arm. Relevant physical exam findings are noted in the Assessment and Plan. ? ?right upper arm ?Healing biopsy site ?[id number] ? ? ? ?Assessment & Plan  ?Neoplasm of uncertain behavior of skin ?right upper arm ? ?Skin excision ? ?Lesion length (cm):  1 ?Margin per side (cm):  0.5 ?Total excision diameter (cm):  2 ?Informed consent: discussed and consent obtained   ?Timeout: patient name, date of birth, surgical site, and procedure verified   ?Procedure prep:  Patient was prepped and draped in usual sterile fashion ?Prep type:  Chlorhexidine ?Anesthesia: the lesion was anesthetized in a standard fashion   ?Anesthetic:  1% lidocaine w/ epinephrine 1-100,000 buffered w/ 8.4% NaHCO3 (3cc lido w/epi, 9cc 0.25% bupivicaine) ?Instrument used comment:  15c ?Hemostasis achieved with: suture, pressure and electrodesiccation   ?Outcome: patient tolerated procedure well with no complications   ?Post-procedure details: wound care instructions given   ?Additional details:  Mupirocin and a pressure dressing applied ? ?Skin repair ?Complexity:  Complex ?Final length (cm):  5.1 ?Informed consent: discussed and consent obtained   ?Timeout: patient name, date of birth, surgical site, and procedure verified   ?Procedure prep:  Patient was prepped and draped in usual sterile fashion ?Prep type:  Chlorhexidine ?Anesthesia: the  lesion was anesthetized in a standard fashion   ?Anesthetic:  1% lidocaine w/ epinephrine 1-100,000 local infiltration ?Reason for type of repair: reduce tension to allow closure, reduce the risk of dehiscence, infection, and necrosis, reduce subcutaneous dead space and avoid a hematoma, allow closure of the large defect, allow side-to-side closure without requiring a flap or graft and enhance both functionality and cosmetic results   ?Undermining: area extensively undermined   ?Subcutaneous layers (deep stitches):  ?Suture size:  4-0 and 3-0 ?Suture type: Vicryl (polyglactin 910)   ?Stitches:  Buried vertical mattress ?Fine/surface layer approximation (top stitches):  ?Suture size:  4-0 ?Suture type: Prolene (polypropylene)   ?Suture removal (days):  7 ?Hemostasis achieved with: pressure and electrodesiccation ?Outcome: patient tolerated procedure well with no complications   ?Post-procedure details: wound care instructions given   ?Additional details:  Extensive undermining greater than the maximum width of the defect along at least one entire edge of the defect was performed ?Maximum width of defect perpendicular to the line of the closure 1.8 cm ?Width of undermining done 2.0 cm ? ?Mupirocin and a pressure bandage applied ? ? ?mupirocin ointment (BACTROBAN) 2 % ?Apply 1 application topically daily. With dressing changes ? ?Specimen 1 - Surgical pathology ?Differential Diagnosis: BX proven SCC ? ?Check Margins: yes ?Healing biopsy site ?Tagged at distal tip ?[id number] ? ?Rash ? ?Related Medications ?mupirocin ointment (BACTROBAN) 2 % ?Apply 1 application topically 3 (three) times daily. ? ? ?Return in about 1 week (around 11/06/2021) for Suture Removal, recheck face, right lower leg. ? ?Graciella Belton, RMA, am  acting as scribe for Forest Gleason, MD . ? ?Documentation: I have reviewed the above documentation for accuracy and completeness, and I agree with the above. ? ?Forest Gleason, MD ? ?

## 2021-10-30 NOTE — Patient Instructions (Signed)

## 2021-10-31 ENCOUNTER — Telehealth: Payer: Self-pay

## 2021-10-31 NOTE — Progress Notes (Signed)
? ? ?Chronic Care Management ?Pharmacy Assistant  ? ?Name: Kevin Terrell  MRN: [id number] DOB: 1941/06/06 ? ?Reason for Encounter: CCM (Reschedule Initial Appointment) ?  ?Recent office visits:  ?08/18/2021 - Kevin Simpler, MD - Message - Change due to balance issues. Gabapentin 300 mg only at bedtime vs. twice a day.   ? ?Recent consult visits:  ?10/30/2021 - Kevin Patten, MD - Dermatology - Patient presented for neoplasm of uncertain behavior of skin. Procedure of skin excision of right upper arm. ?10/25/2021 - Kevin Evens, MD - Oncology - Patient presented for malignant neoplasm of lung. Referral to pulmonology.  ?10/22/2021 - Radiology - CT Abdomen pelvis w contrast  ?10/09/2021 - Kevin Patten, MD - Dermatology - Patient presented for actinic keratosis. Procedure: patient had lesions removed by cryotherapy. Change from ointment to cream: triamcinolone ointment (KENALOG) 0.1 %. 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  ?10/03/2021 - Kevin Patten, MD - Dermatology - Patient presented for actinic keratosis. Procedure: patient had lesions removed by cryotherapy.  ?09/13/2021 - Kevin Terrell, [place] - Patient presented for pain due to onychomycosis of toenail. Laser therapy was administered to 1-5 toenails bilateral and patient tolerated the treatment well.  ?07/12/2021 - Kevin Terrell, [place] - Patient presented for pain due to onychomycosis of toenail. Laser therapy was administered to 1-5 toenails bilateral and patient tolerated the treatment well.  ?05/28/2021 - Kevin Patten, MD - Dermatology - Patient presented for actinic keratosis. Procedure: patient had lesions removed by cryotherapy.  ?05/23/2021 - Message - Covid Booster received ?05/10/2021 - Kevin Terrell, [place] - Patient presented for pain due to onychomycosis of toenail. Laser therapy was administered to 1-5 toenails bilateral and patient tolerated the  treatment well.  ? 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  ?10/02/2021 ?08/20/2021 ?08/13/2021 ?08/06/2021 ?07/24/2021 ?07/17/2021 ?05/17/2021 ?05/10/2021 ? ?Chiropractic Medicine Visits with Kevin Terrell ?Patient presented for segmental and somatic dysfunction of cervical regional.  ?Procedure: Manipulation spinal 3-4 regions.  ?08/28/2021 ?08/21/2021 ?08/28/2021 ?08/21/2021 ?08/14/2021 ?08/08/2021 ?08/06/2021 ?08/01/2021 ?07/29/2021 ?07/24/2021 ?07/23/2021 ?07/22/2021 ?07/18/2021 ?07/17/2021 ?07/16/2021 ? ?Hospital visits:  ?None in previous 6 months ? ?Medications: ?Outpatient Encounter Medications as of 10/31/2021  ?Medication Sig  ? albuterol (VENTOLIN HFA) 108 (90 Base) MCG/ACT inhaler Inhale 2 puffs into the lungs every 6 (six) hours as needed for wheezing or shortness of breath.  ? benzonatate (TESSALON PERLES) 100 MG capsule Take 1 capsule (100 mg total) by mouth 3 (three) times daily as needed. (Patient not taking: Reported on 10/25/2021)  ? cetirizine (ZYRTEC) 10 MG tablet Take 10 mg by mouth daily.  ? cholecalciferol (VITAMIN D3) 25 MCG (1000 UT) tablet Take 1,000 Units by mouth daily.  ? diclofenac Sodium (VOLTAREN) 1 % GEL APPLY 4GM TO BOTH KNEES 4 TIMES DAILY ASNEEDED  ? famotidine (PEPCID) 20 MG tablet TAKE 1 TABLET BY MOUTH EVERY NIGHT AT BEDTIME  ? fluticasone (FLONASE) 50 MCG/ACT nasal spray Place 1 spray into both nostrils 2 (two) times daily.  ? gabapentin (NEURONTIN) 300 MG capsule TAKE 1 CAPSULE BY MOUTH TWICE DAILY  ? levothyroxine (SYNTHROID) 50 MCG tablet TAKE 1 TABLET BY MOUTH ONCE A DAY BEFOREBREAKFAST.  ? meloxicam (MOBIC) 15 MG tablet Take 1 tablet (15 mg total) by mouth daily. As needed  ? montelukast (SINGULAIR) 10 MG tablet TAKE 1 TABLET BY MOUTH EVERY NIGHT  AT BEDTIME  ? Multiple Vitamin (MULTIVITAMIN WITH  MINERALS) TABS tablet Take 1 tablet by mouth daily.  ? mupirocin ointment (BACTROBAN) 2 % Apply 1 application topically 3 (three) times daily.  ? mupirocin ointment (BACTROBAN) 2 % Apply 1 application topically daily. With dressing changes  ? Probiotic Product (PROBIOTIC PO) Take 1 capsule by mouth 2 (two) times daily.  ? Kevin Terrell (CRESTOR) 5 MG tablet Take 1 tablet (5 mg total) by mouth daily.  ? triamcinolone ointment (KENALOG) 0.1 % APPLY TO HANDS AT BEDTIME AND COVER WITHGLOVES AS NEEDED FOR FLARES. AVOID FACE/GROIN/AXILLA.  ? ?No facility-administered encounter medications on file as of 10/31/2021.  ? ?No results found for: HGBA1C, MICROALBUR  ? ?BP Readings from Last 3 Encounters:  ?04/19/21 120/72  ?03/20/21 100/74  ?01/10/21 114/78  ? ?Patient contacted to review initial questions prior to visit with Charlene Brooke. Patient called today and made an appointment with Dr. Silvio Pate on 11/01/2021 to address a concern he is having. Patient is looking at making some medication changes. Patient has changed his initial appointment with Charlene Brooke from 11/04/2021 to 12/03/2021 so if medication changes are made patient will have a chance to know if changes are effective and can discuss them with [name]. I gave patient my direct number in case he should need anything in the interim.  ? ?Star Rating Drugs:  ?Medication:  Last Fill: Day Supply ?Kevin Terrell 5 mg 10/25/2021 30 ? ?Care Gaps: ?Annual wellness visit in last year? Yes 01/10/2021 ?Most Recent BP reading: 120/72 on 04/19/2021 ? ?Charlene Brooke, CPP notified ? ?[name], RMA ?Clinical Pharmacy Assistant ?[phone] ? ?Time Spent: 60 Minutes ? ? ?

## 2021-10-31 NOTE — Telephone Encounter (Signed)
Called patient to see how he is doing after yesterday's surgery. Left message to call the office if he has any concerns or can page Dr. Laurence Ferrari if it's over the weekend. ?Lurlean Horns., RMA ?

## 2021-10-31 NOTE — Progress Notes (Signed)
Error

## 2021-11-01 ENCOUNTER — Ambulatory Visit (INDEPENDENT_AMBULATORY_CARE_PROVIDER_SITE_OTHER): Payer: Medicare Other | Admitting: Internal Medicine

## 2021-11-01 ENCOUNTER — Encounter: Payer: Self-pay | Admitting: Internal Medicine

## 2021-11-01 ENCOUNTER — Other Ambulatory Visit: Payer: Self-pay

## 2021-11-01 DIAGNOSIS — G620 Drug-induced polyneuropathy: Secondary | ICD-10-CM

## 2021-11-01 DIAGNOSIS — T451X5A Adverse effect of antineoplastic and immunosuppressive drugs, initial encounter: Secondary | ICD-10-CM | POA: Diagnosis not present

## 2021-11-01 NOTE — Assessment & Plan Note (Signed)
Ongoing balance issues ?Does okay on concrete ?Discussed using walking stick on uneven ground ?Try bare feet at home ? ?Uses the gabapentin 300 bid for pain ?

## 2021-11-01 NOTE — Progress Notes (Signed)
? ?Subjective:  ? ? Patient ID: Kevin Terrell, male    DOB: 1941-01-18, 81 y.o.   MRN: [id number] ? ?HPI ?Here due to ongoing balance problems ? 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 ?Does okay walking on concrete sidewalk ?In his house--carpet and tile---in confined places with stop and go and lots of turning, he will fall off to the side when turning (and has had to catch himself) ?He will come close to bumping into corners when walking  ?Reminds him of the sensation of having too much to drink ? ?Current Outpatient Medications on File Prior to Visit  ?Medication Sig Dispense Refill  ? cetirizine (ZYRTEC) 10 MG tablet Take 10 mg by mouth daily.    ? cholecalciferol (VITAMIN D3) 25 MCG (1000 UT) tablet Take 1,000 Units by mouth daily.    ? diclofenac Sodium (VOLTAREN) 1 % GEL APPLY 4GM TO BOTH KNEES 4 TIMES DAILY ASNEEDED 200 g 11  ? famotidine (PEPCID) 20 MG tablet TAKE 1 TABLET BY MOUTH EVERY NIGHT AT BEDTIME 90 tablet 3  ? fluticasone (FLONASE) 50 MCG/ACT nasal spray Place 1 spray into both nostrils 2 (two) times daily.    ? gabapentin (NEURONTIN) 300 MG capsule TAKE 1 CAPSULE BY MOUTH TWICE DAILY 180 capsule 3  ? levothyroxine (SYNTHROID) 50 MCG tablet TAKE 1 TABLET BY MOUTH ONCE A DAY BEFOREBREAKFAST. 90 tablet 3  ? meloxicam (MOBIC) 15 MG tablet Take 1 tablet (15 mg total) by mouth daily. As needed 90 tablet 3  ? montelukast (SINGULAIR) 10 MG tablet TAKE 1 TABLET BY MOUTH EVERY NIGHT AT BEDTIME 90 tablet 3  ? Multiple Vitamin (MULTIVITAMIN WITH MINERALS) TABS tablet Take 1 tablet by mouth daily.    ? mupirocin ointment (BACTROBAN) 2 % Apply 1 application topically 3 (three) times daily. 22 g 0  ? mupirocin ointment (BACTROBAN) 2 % Apply 1 application topically daily. With dressing changes  22 g 0  ? Probiotic Product (PROBIOTIC PO) Take 1 capsule by mouth 2 (two) times daily.    ? rosuvastatin (CRESTOR) 5 MG tablet Take 1 tablet (5 mg total) by mouth daily. 30 tablet 0  ? triamcinolone ointment (KENALOG) 0.1 % APPLY TO HANDS AT BEDTIME AND COVER WITHGLOVES AS NEEDED FOR FLARES. AVOID FACE/GROIN/AXILLA. 80 g 0  ? ?No current facility-administered medications on file prior to visit.  ? ? ?Allergies  ?Allergen Reactions  ? Compazine [Prochlorperazine Edisylate]   ?  muscle spasmus ?  ? Other   ?  Okra - hives  ? ? ?Past Medical History:  ?Diagnosis Date  ? Actinic keratosis   ? Arthritis   ? 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 GERD (gastroesophageal reflux disease)   ? with atypical cough  ? Hypothyroidism   ? Lung cancer [facility]) 2009  ? chemo, NO radiation. treated in [place]  ? Seborrheic dermatitis   ? 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 right upper arm  ? ? ?Past Surgical History:  ?Procedure Laterality Date  ? CHOLECYSTECTOMY    ? COLONOSCOPY    ? COLONOSCOPY WITH PROPOFOL N/A 11/30/2019  ? Procedure: COLONOSCOPY WITH PROPOFOL;  Surgeon: Lin Landsman, MD;  Location: [facility] ENDOSCOPY;  Service: Gastroenterology;  Laterality: N/A;  ? EYE SURGERY    ? vitreous detachment---laser  ?  KNEE CARTILAGE SURGERY Right 1968  ? KNEE SURGERY    ? bilateral meniscal surgeries  ? LUNG LOBECTOMY    ? upper right lung 12/15/2007   ? 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 UPPER GI ENDOSCOPY    ? ? ?Family History  ?Problem Relation Age of Onset  ? Cancer Mother   ? Alzheimer's disease Mother   ? Stroke Maternal Grandmother   ? Hypertension Father   ? Alzheimer's disease Father   ? ? ?Social History  ? ?Socioeconomic History  ? Marital status: Married  ?  Spouse name: Not on file  ? Number of children: 3  ? Years of education: Not on file  ? Highest education level: Not on file  ?Occupational History  ? Occupation: Animal nutritionist  ?  Comment: retired  ?Tobacco Use  ? Smoking status: Never  ?  Smokeless tobacco: Never  ?Vaping Use  ? Vaping Use: Never used  ?Substance and Sexual Activity  ? Alcohol use: Yes  ?  Comment: Once glass of wine per month, none last 24hrs  ? Drug use: Never  ? Sexual activity: Yes  ?Other Topics Concern  ? Not on file  ?Social History Narrative  ? Married  ? Veterinarian  ? Wife is nurse   ? Has a daughter in [place]; son and daughter in [place]   ? 6 grandchildren  ? Orginially from [facility]; spent some time in [place]; moved here 2019.   ? Lives at [place]  ?   ? Has advanced directives  ? Daughter [name] is health care POA  ? Would accept resuscitation attempts  ? No feeding tube if cognitively unaware  ? ?Social Determinants of Health  ? ?Financial Resource Strain: Not on file  ?Food Insecurity: Not on file  ?Transportation Needs: Not on file  ?Physical Activity: Not on file  ?Stress: Not on file  ?Social Connections: Not on file  ?Intimate Partner Violence: Not on file  ? ?Review of Systems ?No falls ?No headaches ?No focal weakness ? ?   ?Objective:  ? Physical Exam ?Constitutional:   ?   Appearance: Normal appearance.  ?Neurological:  ?   Mental Status: He is alert.  ?   Comments: Normal gait ?Normal leg strength ?Romberg negative  ?  ? ? ? ? ?   ?Assessment & Plan:  ? ?

## 2021-11-04 ENCOUNTER — Telehealth: Payer: Medicare Other

## 2021-11-05 ENCOUNTER — Other Ambulatory Visit: Payer: Self-pay | Admitting: Surgery

## 2021-11-05 DIAGNOSIS — R1032 Left lower quadrant pain: Secondary | ICD-10-CM

## 2021-11-07 ENCOUNTER — Ambulatory Visit (INDEPENDENT_AMBULATORY_CARE_PROVIDER_SITE_OTHER): Payer: Medicare Other | Admitting: Dermatology

## 2021-11-07 ENCOUNTER — Encounter: Payer: Self-pay | Admitting: Dermatology

## 2021-11-07 ENCOUNTER — Other Ambulatory Visit: Payer: Self-pay

## 2021-11-07 DIAGNOSIS — Z4802 Encounter for removal of sutures: Secondary | ICD-10-CM

## 2021-11-07 DIAGNOSIS — M5033 Other cervical disc degeneration, cervicothoracic region: Secondary | ICD-10-CM | POA: Diagnosis not present

## 2021-11-07 DIAGNOSIS — M62830 Muscle spasm of back: Secondary | ICD-10-CM | POA: Diagnosis not present

## 2021-11-07 DIAGNOSIS — M9901 Segmental and somatic dysfunction of cervical region: Secondary | ICD-10-CM | POA: Diagnosis not present

## 2021-11-07 DIAGNOSIS — L570 Actinic keratosis: Secondary | ICD-10-CM | POA: Diagnosis not present

## 2021-11-07 DIAGNOSIS — M9902 Segmental and somatic dysfunction of thoracic region: Secondary | ICD-10-CM | POA: Diagnosis not present

## 2021-11-07 NOTE — Patient Instructions (Signed)

## 2021-11-07 NOTE — Progress Notes (Signed)
? ?  Follow-Up Visit ?  ?Subjective  ?Kevin Terrell is a 81 y.o. male who presents for the following: Follow-up (Patient here today for suture removal R upper arm SCC, margins free.  Also here to recheck AK at right ankle that was treated with LN2 multiple times and AK's at face. ). ? ? ?The following portions of the chart were reviewed this encounter and updated as appropriate:  ?  ?  ? ?Review of Systems:  No other skin or systemic complaints except as noted in HPI or Assessment and Plan. ? ?Objective  ?Well appearing patient in no apparent distress; mood and affect are within normal limits. ? ?A focused examination was performed including right leg, right arm, face. Relevant physical exam findings are noted in the Assessment and Plan. ? ?right medial ankle ?Small pink healing 5 mm ulceration with surrounding mild erythema R ankle ?Ak on L lower lip vermillion border is clear ? ? ? ?Assessment & Plan  ?AK (actinic keratosis) ?right medial ankle ? ?Wound on ankle is still healing from cryotherapy 10/09/20. Aks on lip is clear. ? ?Continue wound care with ointment/bandaid. ? ?Discussed lower leg takes longer to heal.  Recommend rechecking area once completely healed ? ? ?Encounter for Removal of Sutures ?- Incision site at the right upper arm is clean, dry and intact ?- Wound cleansed, sutures removed, wound cleansed and steri strips applied.  ?- Discussed pathology results showing no residual SCC, margins free  ?- Patient advised to keep steri-strips dry until they fall off. ?- Scars remodel for a full year. ?- Once steri-strips fall off, patient can apply over-the-counter silicone scar cream each night to help with scar remodeling if desired. ?- Patient advised to call with any concerns or if they notice any new or changing lesions. ? ? ?Return for TBSE, as scheduled with Dr. Laurence Ferrari. ? ?Graciella Belton, RMA, am acting as scribe for Brendolyn Patty, MD . ? ?Documentation: I have reviewed the above documentation for  accuracy and completeness, and I agree with the above. ? ?Brendolyn Patty MD  ? ?

## 2021-11-13 ENCOUNTER — Ambulatory Visit
Admission: RE | Admit: 2021-11-13 | Discharge: 2021-11-13 | Disposition: A | Discharge status: Home / Self Care | Payer: Medicare Other | Source: Ambulatory Visit | Attending: Surgery | Admitting: Surgery

## 2021-11-13 ENCOUNTER — Other Ambulatory Visit: Payer: Self-pay

## 2021-11-13 ENCOUNTER — Telehealth: Payer: Self-pay

## 2021-11-13 DIAGNOSIS — M5033 Other cervical disc degeneration, cervicothoracic region: Secondary | ICD-10-CM | POA: Diagnosis not present

## 2021-11-13 DIAGNOSIS — M9901 Segmental and somatic dysfunction of cervical region: Secondary | ICD-10-CM | POA: Diagnosis not present

## 2021-11-13 DIAGNOSIS — M62830 Muscle spasm of back: Secondary | ICD-10-CM | POA: Diagnosis not present

## 2021-11-13 DIAGNOSIS — R109 Unspecified abdominal pain: Secondary | ICD-10-CM | POA: Diagnosis not present

## 2021-11-13 DIAGNOSIS — R1032 Left lower quadrant pain: Secondary | ICD-10-CM | POA: Diagnosis not present

## 2021-11-13 DIAGNOSIS — M9902 Segmental and somatic dysfunction of thoracic region: Secondary | ICD-10-CM | POA: Diagnosis not present

## 2021-11-13 IMAGING — US US ABDOMEN LIMITED
1 series · 13 of 13 positions shown · non-contrast
Comparison: CT abdomen pelvis dated [DATE].

CLINICAL DATA: Abdominal wall pain in the region of the prior
laparoscopy site.

EXAM:
ULTRASOUND ABDOMEN LIMITED

[Series 1: us abdomen limited · 0.11mm/px · 13 acquisitions, 13 frames shown]
[im 1/13]
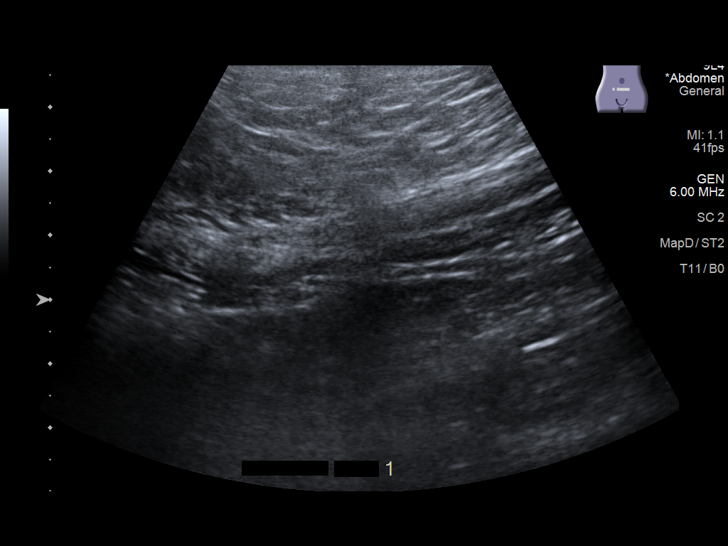
[im 2/13]
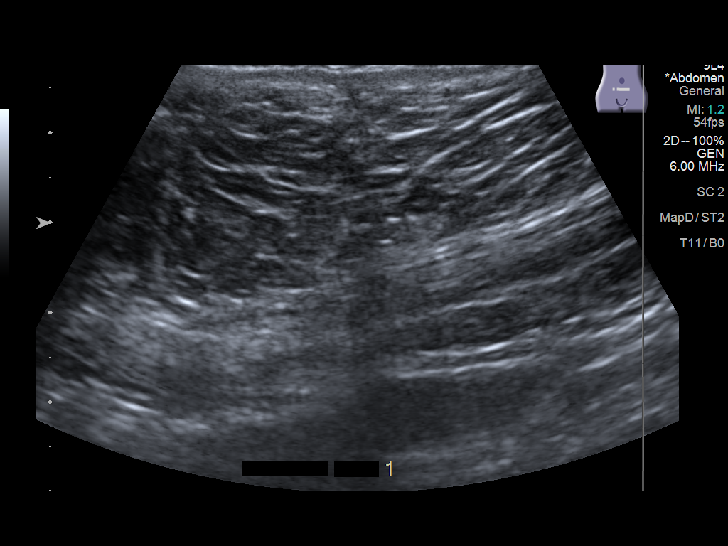
[im 3/13]
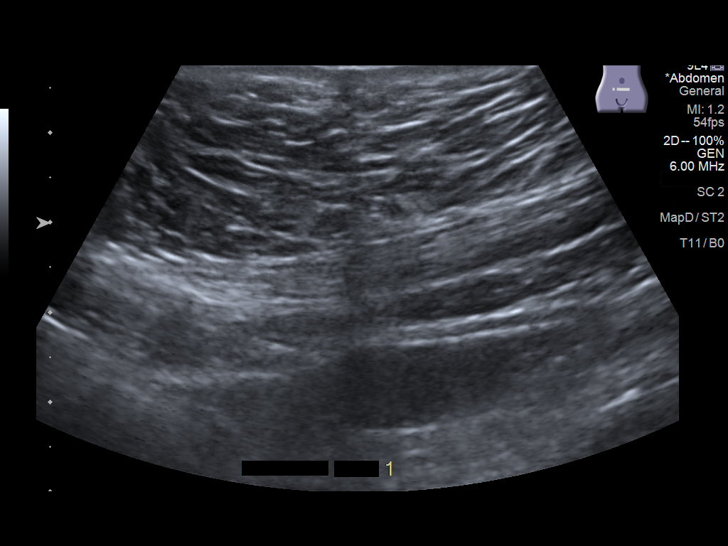
[im 4/13]
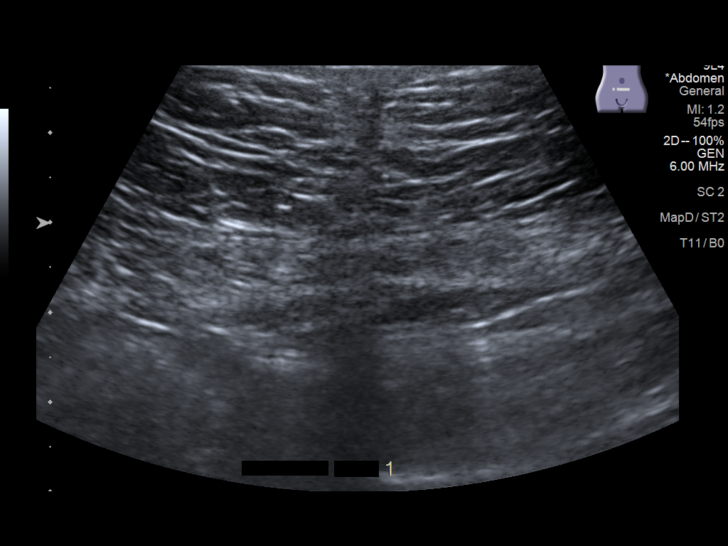
[im 5/13]
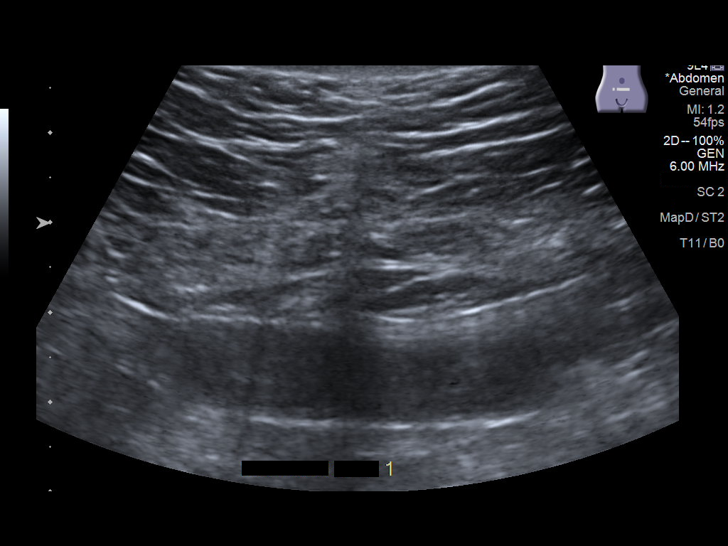
[im 6/13]
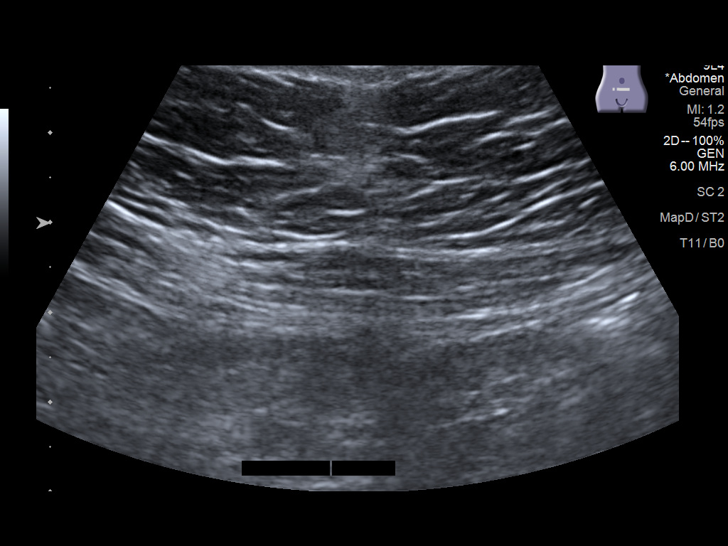
[im 7/13]
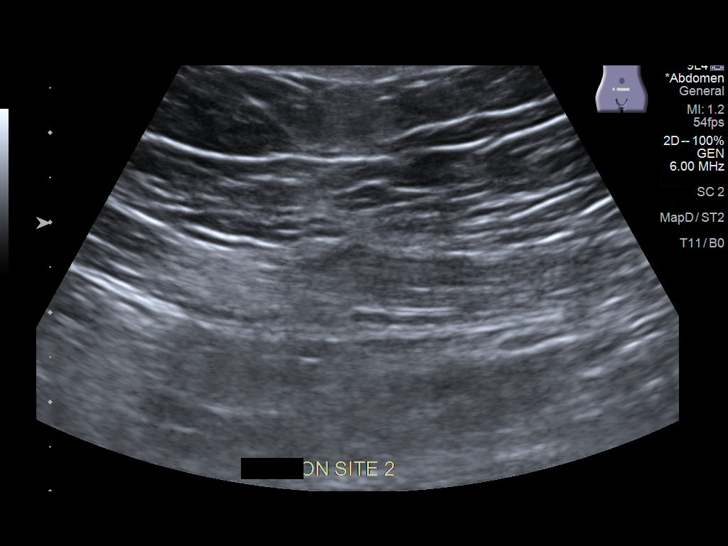
[im 8/13]
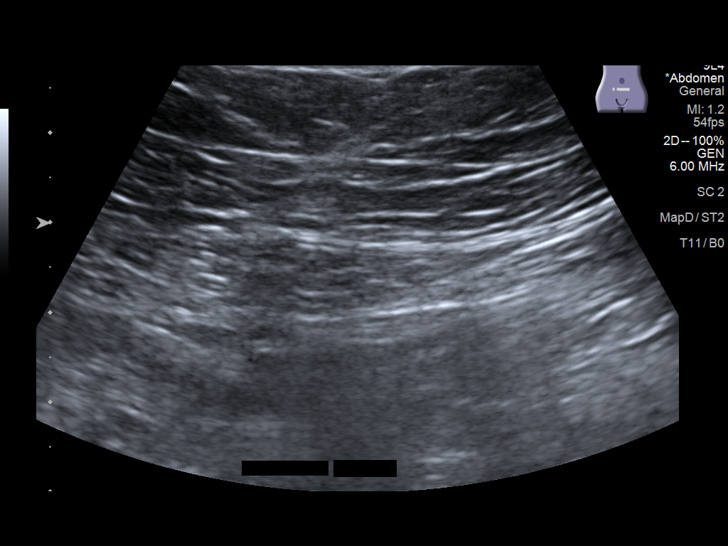
[im 9/13]
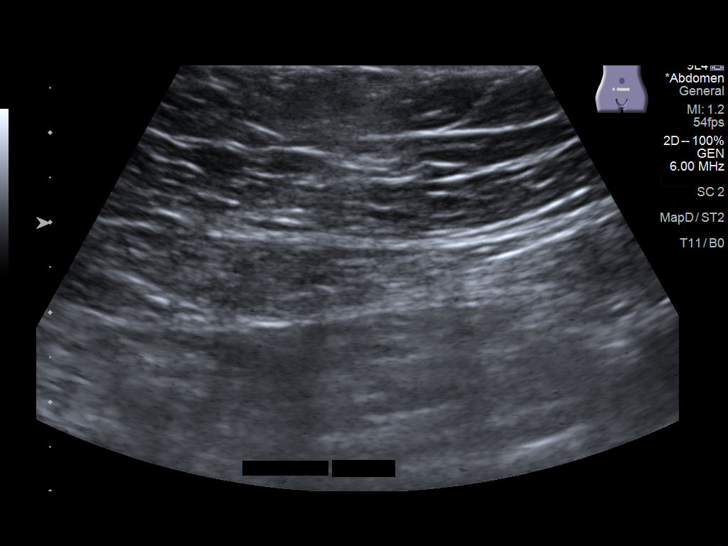
[im 10/13]
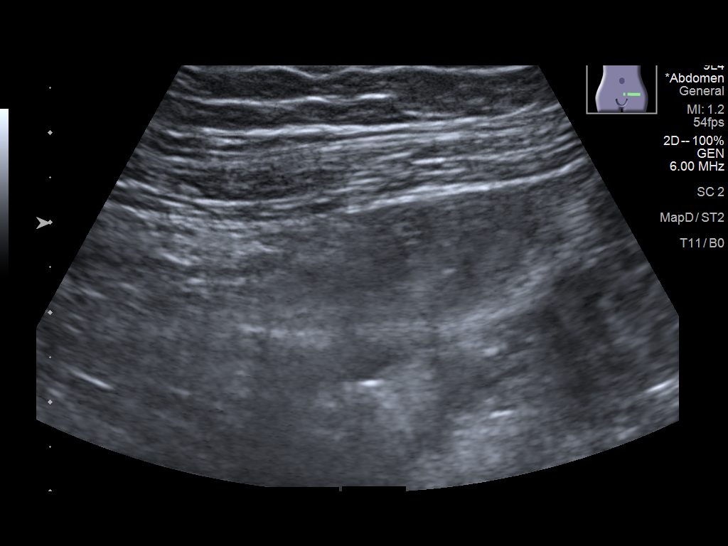
[im 11/13]
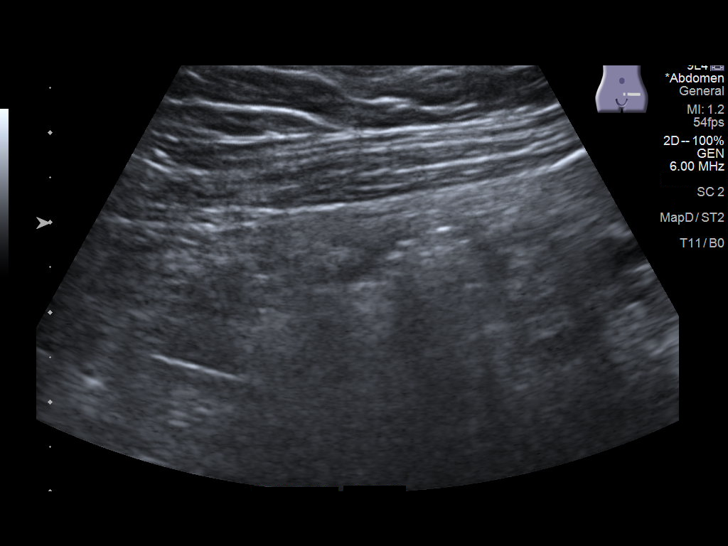
[im 12/13]
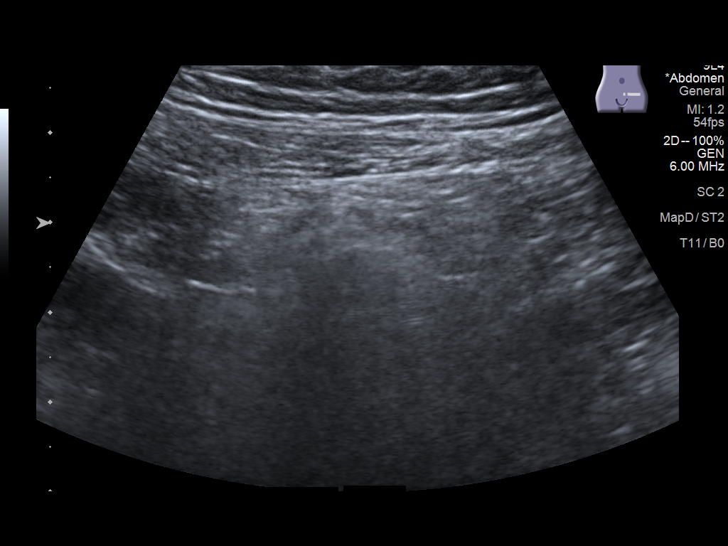
[im 13/13]
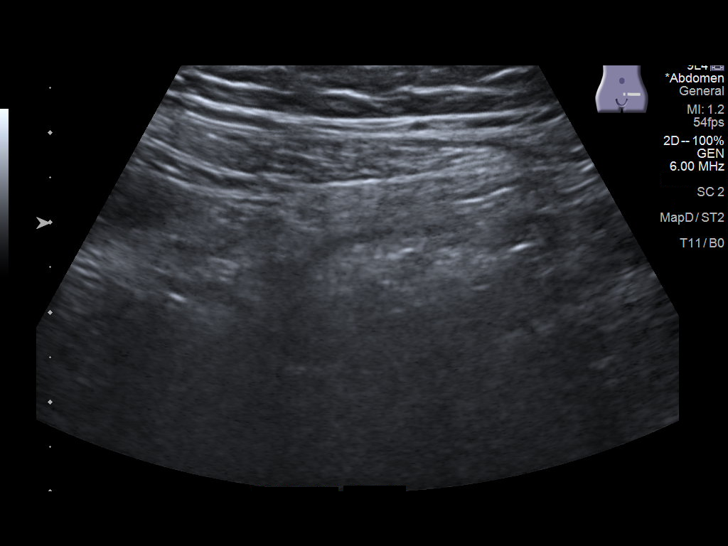

[13 of 13 positions shown; findings below may reference images not displayed]

FINDINGS: Targeted sonographic images of the abdominal wall in the region of
the clinical concern was performed.

A surgical scar is noted. No mass, fluid collection, hernia, or
abnormal vascularity.
IMPRESSION: Negative.

## 2021-11-13 NOTE — Telephone Encounter (Signed)
Released 03/01 BX to patient on My Chart as patient wanted copies of these results. aw ?

## 2021-11-15 ENCOUNTER — Other Ambulatory Visit: Payer: Medicare Other

## 2021-11-19 ENCOUNTER — Other Ambulatory Visit: Payer: Self-pay

## 2021-11-19 MED ORDER — ROSUVASTATIN CALCIUM 5 MG PO TABS
5.0000 mg | ORAL_TABLET | Freq: Every day | ORAL | 0 refills | Status: DC
Start: 2021-11-19 — End: 2022-04-04

## 2021-11-20 DIAGNOSIS — M5033 Other cervical disc degeneration, cervicothoracic region: Secondary | ICD-10-CM | POA: Diagnosis not present

## 2021-11-20 DIAGNOSIS — Z20822 Contact with and (suspected) exposure to covid-19: Secondary | ICD-10-CM | POA: Diagnosis not present

## 2021-11-20 DIAGNOSIS — M62830 Muscle spasm of back: Secondary | ICD-10-CM | POA: Diagnosis not present

## 2021-11-20 DIAGNOSIS — M9901 Segmental and somatic dysfunction of cervical region: Secondary | ICD-10-CM | POA: Diagnosis not present

## 2021-11-20 DIAGNOSIS — M9902 Segmental and somatic dysfunction of thoracic region: Secondary | ICD-10-CM | POA: Diagnosis not present

## 2021-11-21 ENCOUNTER — Telehealth: Payer: Self-pay

## 2021-11-21 NOTE — Telephone Encounter (Signed)
Spoke with patient by phone to discuss lung cancer screening referral. Patient states he 'never really smoked cigarettes' and describes smoking history as using pipe tobacco - not cigarettes.  Had no pattern for smoking cigarettes - only socially on occasion in college.  Patient would not qualify for lung cancer screening LDCT.  He has a history of lung cancer and would like to continue lung scanning.  Advised we would notify referring provider, Dr. Janese Banks of this information and further orders for CT chest wo contrast could be scheduled by her office, pending her recommendation.  The patient has his las CT chest in July 2022.  Patient is in agreement and this note routed to Dr. Janese Banks.  Referral for lung cancer screening will be closed due to patient does not meet pack year requirements for LCS LDCT.   ?

## 2021-11-27 DIAGNOSIS — M9902 Segmental and somatic dysfunction of thoracic region: Secondary | ICD-10-CM | POA: Diagnosis not present

## 2021-11-27 DIAGNOSIS — M5033 Other cervical disc degeneration, cervicothoracic region: Secondary | ICD-10-CM | POA: Diagnosis not present

## 2021-11-27 DIAGNOSIS — M9901 Segmental and somatic dysfunction of cervical region: Secondary | ICD-10-CM | POA: Diagnosis not present

## 2021-11-27 DIAGNOSIS — M62830 Muscle spasm of back: Secondary | ICD-10-CM | POA: Diagnosis not present

## 2021-11-28 ENCOUNTER — Telehealth: Payer: Self-pay

## 2021-11-28 ENCOUNTER — Encounter: Payer: Self-pay | Admitting: Internal Medicine

## 2021-11-28 NOTE — Chronic Care Management (AMB) (Signed)
? ? ?  Chronic Care Management ?Pharmacy Assistant  ? ?Name: [name]  MRN: [id number] DOB: 15-Aug-1941 ? ?Kevin Terrell is an 81 y.o. year old male who presents for his initial CCM visit with the clinical pharmacist. ? ?Reason for Encounter: Initial Questions ?  ?Conditions to be addressed/monitored: ?HLD ? ? ?Recent office visits:  ?11/01/21-PCP-Richard Letvak,MD-Unsteady Gait-taking gabapentin 300mg  BID.Discussed walking stick for balance- no medication changes ? ?Recent consult visits:  ?11/07/21-Dermatology-Tara Stewart,MD- follow up AK.Healing from cryotherapy.No medication changes ?10/30/21-Dermatology-Virginia Moye,MD-Skin Excision-start mupirocin 2% apply 1 application topically daily with dressing changes ?10/09/21-Dermatology-Virginia Moye,MD-follow up AK.No medication changes ?10/03/21-Dermatology-cryotherapy ?10/02/21-Orthopedics-Andrew Kubinski,DO- Left shoulder corticosteroid injection  ?09/30/21-General Surgery- no data found ?08/28/21-Chiropractor- Iris Pert- multiple visits ?08/20/21-Orthopedics-Andrew Kubinski,DO- Bilateral knee viscosupplementation injection- multiple visits  ? ?Hospital visits:  ?None in previous 6 months ? ?Medications: ?Outpatient Encounter Medications as of 11/28/2021  ?Medication Sig  ? cetirizine (ZYRTEC) 10 MG tablet Take 10 mg by mouth daily.  ? cholecalciferol (VITAMIN D3) 25 MCG (1000 UT) tablet Take 1,000 Units by mouth daily.  ? diclofenac Sodium (VOLTAREN) 1 % GEL APPLY 4GM TO BOTH KNEES 4 TIMES DAILY ASNEEDED  ? famotidine (PEPCID) 20 MG tablet TAKE 1 TABLET BY MOUTH EVERY NIGHT AT BEDTIME  ? fluticasone (FLONASE) 50 MCG/ACT nasal spray Place 1 spray into both nostrils 2 (two) times daily.  ? gabapentin (NEURONTIN) 300 MG capsule TAKE 1 CAPSULE BY MOUTH TWICE DAILY  ? levothyroxine (SYNTHROID) 50 MCG tablet TAKE 1 TABLET BY MOUTH ONCE A DAY BEFOREBREAKFAST.  ? meloxicam (MOBIC) 15 MG tablet Take 1 tablet (15 mg total) by mouth daily. As needed  ? montelukast (SINGULAIR)  10 MG tablet TAKE 1 TABLET BY MOUTH EVERY NIGHT AT BEDTIME  ? Multiple Vitamin (MULTIVITAMIN WITH MINERALS) TABS tablet Take 1 tablet by mouth daily.  ? mupirocin ointment (BACTROBAN) 2 % Apply 1 application topically 3 (three) times daily.  ? mupirocin ointment (BACTROBAN) 2 % Apply 1 application topically daily. With dressing changes  ? Probiotic Product (PROBIOTIC PO) Take 1 capsule by mouth 2 (two) times daily.  ? rosuvastatin (CRESTOR) 5 MG tablet Take 1 tablet (5 mg total) by mouth daily.  ? triamcinolone ointment (KENALOG) 0.1 % APPLY TO HANDS AT BEDTIME AND COVER WITHGLOVES AS NEEDED FOR FLARES. AVOID FACE/GROIN/AXILLA.  ? ?No facility-administered encounter medications on file as of 11/28/2021.  ? ? ?No results found for: HGBA1C, MICROALBUR  ? ?BP Readings from Last 3 Encounters:  ?11/01/21 110/78  ?04/19/21 120/72  ?03/20/21 100/74  ? ? ?Unsuccessful attempt to reach patient. Left patient message reminding patient of appointment. ? ?Patient contacted to confirm telephone appointment with Charlene Brooke, Pharm D, on 12/03/21 at 1:30pm. ? ? ? ?Star Rating Drugs:  ?Medication:  Last Fill: Day Supply ?Rosuvastatin 5mg  10/25/21 30 ? ?Care Gaps: ?Annual wellness visit in last year? Yes ?Most Recent BP reading:110/78  78-P  11/01/21 ? ? ?Charlene Brooke, CPP notified ? ?Sarah Baez, CCMA ?Health concierge  ?[phone]  ?

## 2021-11-29 ENCOUNTER — Other Ambulatory Visit: Payer: Medicare Other

## 2021-12-03 ENCOUNTER — Telehealth: Payer: Medicare Other

## 2021-12-04 DIAGNOSIS — M62830 Muscle spasm of back: Secondary | ICD-10-CM | POA: Diagnosis not present

## 2021-12-04 DIAGNOSIS — M5033 Other cervical disc degeneration, cervicothoracic region: Secondary | ICD-10-CM | POA: Diagnosis not present

## 2021-12-04 DIAGNOSIS — M9901 Segmental and somatic dysfunction of cervical region: Secondary | ICD-10-CM | POA: Diagnosis not present

## 2021-12-04 DIAGNOSIS — M9902 Segmental and somatic dysfunction of thoracic region: Secondary | ICD-10-CM | POA: Diagnosis not present

## 2021-12-11 ENCOUNTER — Encounter: Payer: Self-pay | Admitting: Internal Medicine

## 2021-12-11 ENCOUNTER — Ambulatory Visit (INDEPENDENT_AMBULATORY_CARE_PROVIDER_SITE_OTHER): Payer: Medicare Other | Admitting: Internal Medicine

## 2021-12-11 ENCOUNTER — Ambulatory Visit (INDEPENDENT_AMBULATORY_CARE_PROVIDER_SITE_OTHER)
Admission: RE | Admit: 2021-12-11 | Discharge: 2021-12-11 | Disposition: A | Discharge status: Home / Self Care | Payer: Medicare Other | Source: Ambulatory Visit | Attending: Internal Medicine | Admitting: Internal Medicine

## 2021-12-11 VITALS — BP 108/64 | HR 48 | Temp 97.9°F | Ht 73.0 in | Wt 186.7 lb

## 2021-12-11 DIAGNOSIS — R059 Cough, unspecified: Secondary | ICD-10-CM | POA: Diagnosis not present

## 2021-12-11 DIAGNOSIS — M9902 Segmental and somatic dysfunction of thoracic region: Secondary | ICD-10-CM | POA: Diagnosis not present

## 2021-12-11 DIAGNOSIS — M5033 Other cervical disc degeneration, cervicothoracic region: Secondary | ICD-10-CM | POA: Diagnosis not present

## 2021-12-11 DIAGNOSIS — R053 Chronic cough: Secondary | ICD-10-CM | POA: Diagnosis not present

## 2021-12-11 DIAGNOSIS — M62830 Muscle spasm of back: Secondary | ICD-10-CM | POA: Diagnosis not present

## 2021-12-11 DIAGNOSIS — R911 Solitary pulmonary nodule: Secondary | ICD-10-CM

## 2021-12-11 DIAGNOSIS — R918 Other nonspecific abnormal finding of lung field: Secondary | ICD-10-CM

## 2021-12-11 DIAGNOSIS — M9901 Segmental and somatic dysfunction of cervical region: Secondary | ICD-10-CM | POA: Diagnosis not present

## 2021-12-11 IMAGING — DX DG CHEST 2V
2 series · 2 of 2 positions shown · non-contrast
Comparison: [DATE]

CLINICAL DATA: persistent cough x 4 weeks

EXAM:
CHEST - 2 VIEW

[chest pa]
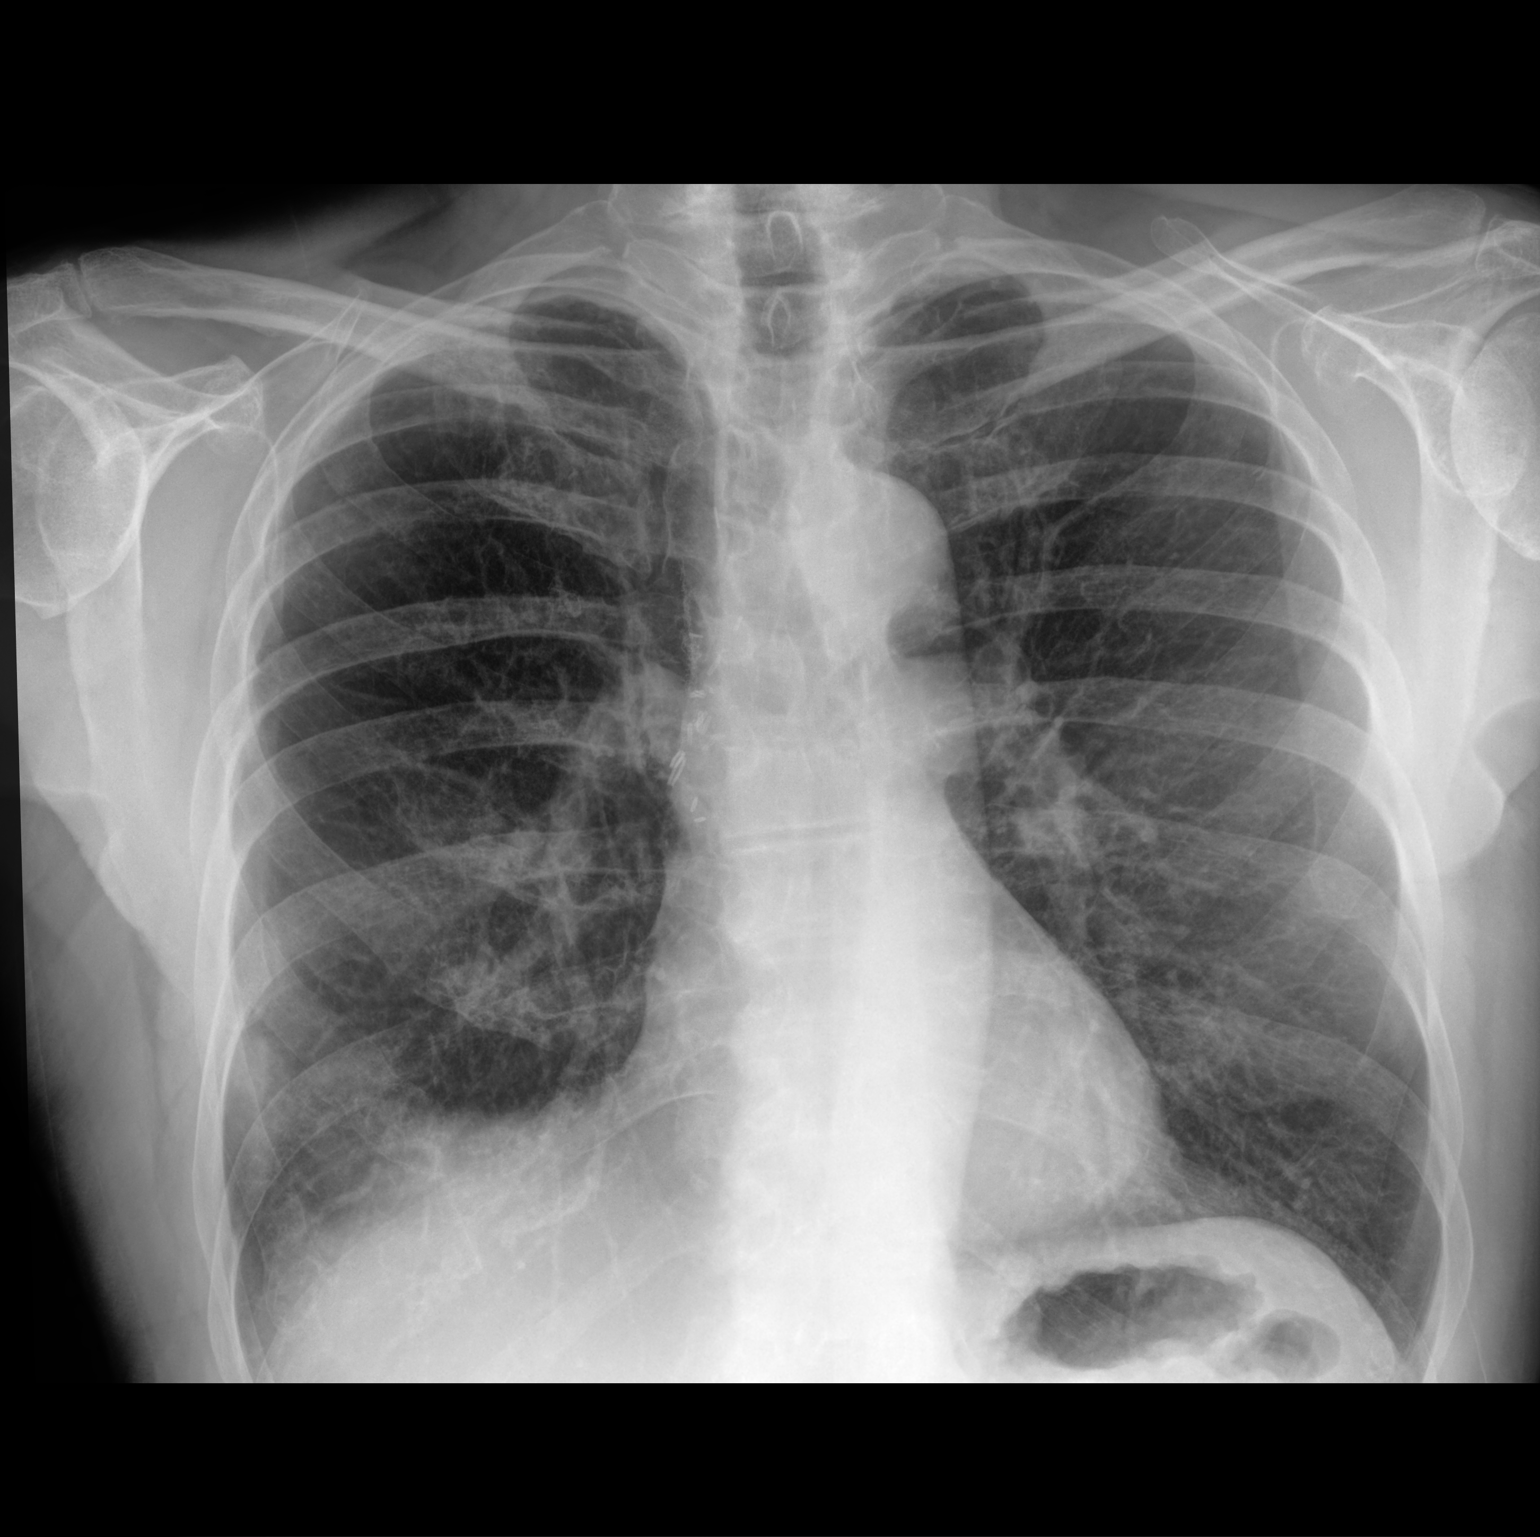

[chest lat]
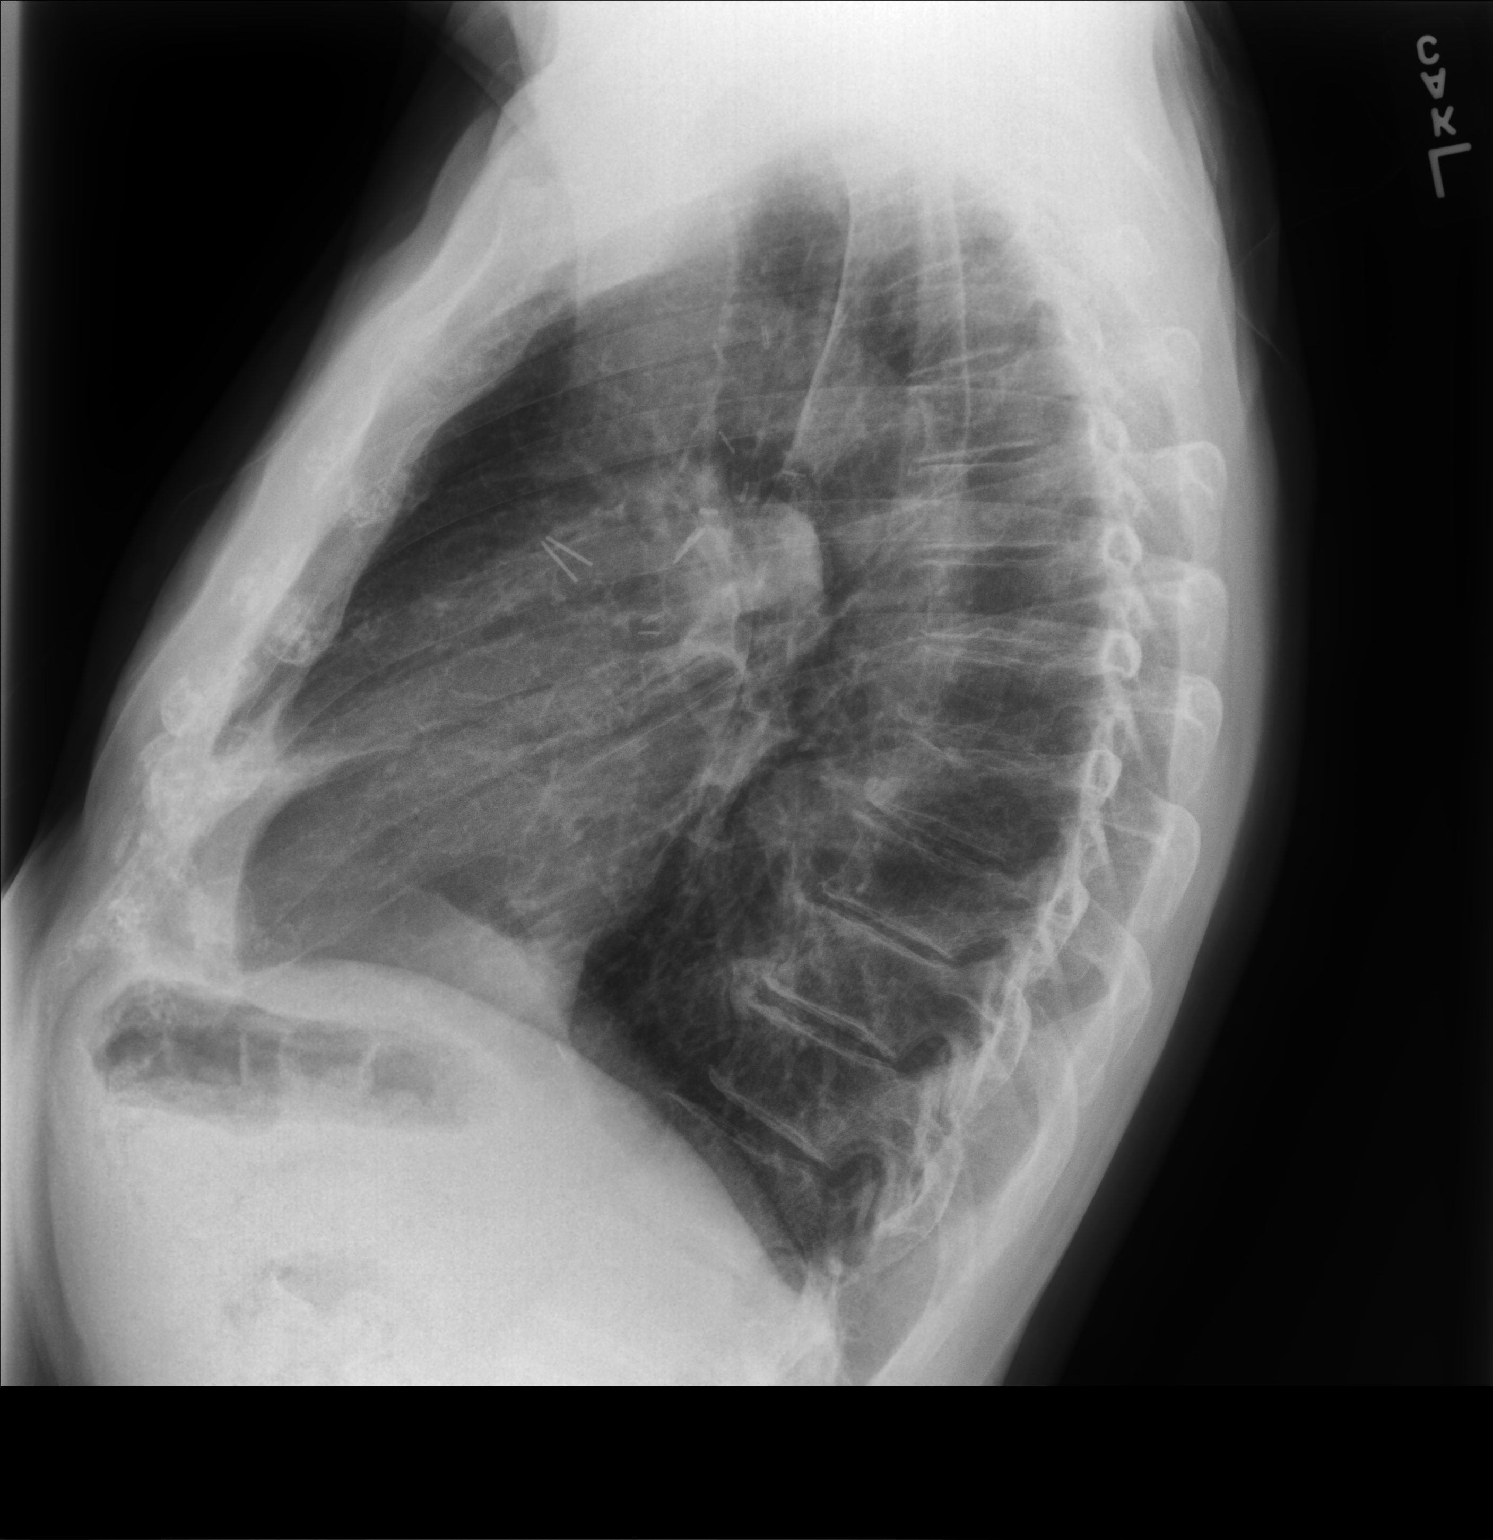

[2 of 2 positions shown; findings below may reference images not displayed]

FINDINGS: Question nodular opacity at the right lung apex. No visible pleural
effusions or pneumothorax. Cardiomediastinal silhouette is within
normal limits. Postsurgical changes in the right lung.
IMPRESSION: Question nodular opacity the right lung apex. This could potentially
be artifactual; however, recommend chest CT to exclude recurrent
malignancy.

## 2021-12-11 MED ORDER — PREDNISONE 20 MG PO TABS: 40.0000 mg | ORAL_TABLET | Freq: Every day | ORAL | 1 refills | Status: DC

## 2021-12-11 NOTE — Progress Notes (Signed)
? ?Subjective:  ? ? Patient ID: Kevin Terrell, male    DOB: 03-31-1941, 81 y.o.   MRN: [id number] ? ?HPI ?Here due to ongoing cough ? ?Lung cancer survivor --- 4/09 had right upper lobectomy for cancer ?Recent CT was negative for recurrence ?Started coughing about a month ago ?Does cough with allergies--and does have post nasal drip ?Sometimes has chest pain--due the severity of cough ?Cough is dry ? ?Doesn't feel sick ?No fever ?No SOB ? ?Uses zyrtec, flonase and montelukast for allergies ? ?Careful with his eating ?Gets reflux if he eats the wrong thing ?Uses the pepcid at night---and uses pepcid plus prn ?May have brief lower esophageal blocked feeling---like with carrots (or other highly fibrous food) ? ?Current Outpatient Medications on File Prior to Visit  ?Medication Sig Dispense Refill  ? cetirizine (ZYRTEC) 10 MG tablet Take 10 mg by mouth daily.    ? cholecalciferol (VITAMIN D3) 25 MCG (1000 UT) tablet Take 1,000 Units by mouth daily.    ? diclofenac Sodium (VOLTAREN) 1 % GEL APPLY 4GM TO BOTH KNEES 4 TIMES DAILY ASNEEDED 200 g 11  ? famotidine (PEPCID) 20 MG tablet TAKE 1 TABLET BY MOUTH EVERY NIGHT AT BEDTIME 90 tablet 3  ? fluticasone (FLONASE) 50 MCG/ACT nasal spray Place 1 spray into both nostrils 2 (two) times daily.    ? gabapentin (NEURONTIN) 300 MG capsule TAKE 1 CAPSULE BY MOUTH TWICE DAILY 180 capsule 3  ? levothyroxine (SYNTHROID) 50 MCG tablet TAKE 1 TABLET BY MOUTH ONCE A DAY BEFOREBREAKFAST. 90 tablet 3  ? meloxicam (MOBIC) 15 MG tablet Take 1 tablet (15 mg total) by mouth daily. As needed 90 tablet 3  ? montelukast (SINGULAIR) 10 MG tablet TAKE 1 TABLET BY MOUTH EVERY NIGHT AT BEDTIME 90 tablet 3  ? Multiple Vitamin (MULTIVITAMIN WITH MINERALS) TABS tablet Take 1 tablet by mouth daily.    ? mupirocin ointment (BACTROBAN) 2 % Apply 1 application topically 3 (three) times daily. 22 g 0  ? Probiotic Product (PROBIOTIC PO) Take 1 capsule by mouth 2 (two) times daily.    ? rosuvastatin  (CRESTOR) 5 MG tablet Take 1 tablet (5 mg total) by mouth daily. 90 tablet 0  ? triamcinolone ointment (KENALOG) 0.1 % APPLY TO HANDS AT BEDTIME AND COVER WITHGLOVES AS NEEDED FOR FLARES. AVOID FACE/GROIN/AXILLA. 80 g 0  ? ?No current facility-administered medications on file prior to visit.  ? ? ?Allergies  ?Allergen Reactions  ? Compazine [Prochlorperazine Edisylate]   ?  muscle spasmus ?  ? Other   ?  Okra - hives  ? ? ?Past Medical History:  ?Diagnosis Date  ? Actinic keratosis   ? Arthritis   ? 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 GERD (gastroesophageal reflux disease)   ? with atypical cough  ? Hypothyroidism   ? Lung cancer [facility]) 2009  ? chemo, NO radiation. treated in [place]  ? Seborrheic dermatitis   ? 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 right upper arm, excised 10/31/21  ? ? ?Past Surgical History:  ?Procedure Laterality Date  ? CHOLECYSTECTOMY    ? COLONOSCOPY    ? COLONOSCOPY WITH PROPOFOL N/A 11/30/2019  ? Procedure: COLONOSCOPY WITH PROPOFOL;  Surgeon: Lin Landsman, MD;  Location: [facility] ENDOSCOPY;  Service: Gastroenterology;  Laterality: N/A;  ? EYE SURGERY    ? vitreous detachment---laser  ? KNEE CARTILAGE SURGERY Right 1968  ? KNEE SURGERY    ? bilateral meniscal surgeries  ? LUNG  LOBECTOMY    ? upper right lung 12/15/2007   ? 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 UPPER GI ENDOSCOPY    ? ? ?Family History  ?Problem Relation Age of Onset  ? Cancer Mother   ? Alzheimer's disease Mother   ? Stroke Maternal Grandmother   ? Hypertension Father   ? Alzheimer's disease Father   ? ? ?Social History  ? ?Socioeconomic History  ? Marital status: Married  ?  Spouse name: Not on file  ? Number of children: 3  ? Years of education: Not on file  ? Highest education level: Not on file  ?Occupational History  ? Occupation: Animal nutritionist  ?  Comment: retired  ?Tobacco Use  ? Smoking status: Never  ? Smokeless tobacco: Never  ?Vaping Use  ? Vaping Use: Never used  ?Substance and Sexual  Activity  ? Alcohol use: Yes  ?  Comment: Once glass of wine per month, none last 24hrs  ? Drug use: Never  ? Sexual activity: Yes  ?Other Topics Concern  ? Not on file  ?Social History Narrative  ? Married  ? Veterinarian  ? Wife is nurse   ? Has a daughter in [place]; son and daughter in [place]   ? 6 grandchildren  ? Orginially from [facility]; spent some time in [place]; moved here 2019.   ? Lives at [place]  ?   ? Has advanced directives  ? Daughter [name] is health care POA  ? Would accept resuscitation attempts  ? No feeding tube if cognitively unaware  ? ?Social Determinants of Health  ? ?Financial Resource Strain: Not on file  ?Food Insecurity: Not on file  ?Transportation Needs: Not on file  ?Physical Activity: Not on file  ?Stress: Not on file  ?Social Connections: Not on file  ?Intimate Partner Violence: Not on file  ? ?Review of Systems ?Appetite is good ?Weight is stable ? ?   ?Objective:  ? Physical Exam ?Constitutional:   ?   Appearance: Normal appearance.  ?HENT:  ?   Right Ear: Tympanic membrane and ear canal normal.  ?   Left Ear: Tympanic membrane and ear canal normal.  ?   Nose:  ?   Comments: Mild nasal swelling ?   Mouth/Throat:  ?   Pharynx: No oropharyngeal exudate or posterior oropharyngeal erythema.  ?Pulmonary:  ?   Effort: Pulmonary effort is normal.  ?   Breath sounds: Normal breath sounds. No wheezing or rales.  ?Musculoskeletal:  ?   Cervical back: Neck supple.  ?Lymphadenopathy:  ?   Cervical: No cervical adenopathy.  ?Neurological:  ?   Mental Status: He is alert.  ?  ? ? ? ? ?   ?Assessment & Plan:  ? ?

## 2021-12-11 NOTE — Assessment & Plan Note (Signed)
Questionable on the CT scan ?His biggest concern with the cough is possible cancer---will go ahead with the CT to be sure nothing there ?

## 2021-12-11 NOTE — Assessment & Plan Note (Addendum)
His main concern is recurrence of lung cancer----this is unlikely. ?Will check CXR ? ?CXR does show some RLL findings---?infiltrate ?Will ask for immediate read by the radiologist ?Possible right nodule---will check CT ?Didn't call any infiltrate ? ?Will treat with 3 days of prednisone (seemed like there was slight tightness to the cough) ?Add fexofenadine for allergies ?

## 2021-12-16 DIAGNOSIS — M6281 Muscle weakness (generalized): Secondary | ICD-10-CM | POA: Diagnosis not present

## 2021-12-16 DIAGNOSIS — R2689 Other abnormalities of gait and mobility: Secondary | ICD-10-CM | POA: Diagnosis not present

## 2021-12-17 ENCOUNTER — Ambulatory Visit
Admission: RE | Admit: 2021-12-17 | Discharge: 2021-12-17 | Disposition: A | Discharge status: Home / Self Care | Payer: Medicare Other | Source: Ambulatory Visit | Attending: Internal Medicine | Admitting: Internal Medicine

## 2021-12-17 ENCOUNTER — Other Ambulatory Visit: Payer: Self-pay

## 2021-12-17 ENCOUNTER — Encounter: Payer: Self-pay | Admitting: Internal Medicine

## 2021-12-17 DIAGNOSIS — I700 Atherosclerosis of aorta: Secondary | ICD-10-CM | POA: Diagnosis not present

## 2021-12-17 DIAGNOSIS — R918 Other nonspecific abnormal finding of lung field: Secondary | ICD-10-CM | POA: Insufficient documentation

## 2021-12-17 DIAGNOSIS — R911 Solitary pulmonary nodule: Secondary | ICD-10-CM | POA: Diagnosis not present

## 2021-12-18 ENCOUNTER — Encounter: Payer: Self-pay | Admitting: Internal Medicine

## 2021-12-18 DIAGNOSIS — Z20822 Contact with and (suspected) exposure to covid-19: Secondary | ICD-10-CM | POA: Diagnosis not present

## 2021-12-18 MED ORDER — AMOXICILLIN-POT CLAVULANATE 875-125 MG PO TABS: 1.0000 | ORAL_TABLET | Freq: Two times a day (BID) | ORAL | 0 refills | Status: DC

## 2021-12-19 ENCOUNTER — Encounter: Payer: Self-pay | Admitting: Internal Medicine

## 2021-12-19 ENCOUNTER — Telehealth: Payer: Self-pay

## 2021-12-19 ENCOUNTER — Encounter: Payer: Self-pay | Admitting: Oncology

## 2021-12-19 MED ORDER — HYDROCODONE BIT-HOMATROP MBR 5-1.5 MG/5ML PO SOLN: 5.0000 mL | Freq: Every evening | ORAL | 0 refills | Status: DC | PRN

## 2021-12-19 NOTE — Telephone Encounter (Signed)
I called [facility] and spoke with Opal Sidles who said report was ordered as routine but Opal Sidles will move to top of the list for reading for CT of chest. Opal Sidles said to look out for reading in pts chart. Sending note to Dr Silvio Pate and Louretta Shorten CMA and will teams Louretta Shorten also. ?

## 2021-12-19 NOTE — Telephone Encounter (Signed)
Yes--we had some MyChart conversations as well. ?Glad he is feeling more comfortable about that ?

## 2021-12-19 NOTE — Telephone Encounter (Signed)
[place] Night - Client ?Nonclinical Telephone Record  ?[facility]? ?Client [name] - Client ?Client Site [place] ?Provider Viviana Simpler- MD ?Contact Type Call ?Who Is Calling Patient / Member / Family / Caregiver ?Caller Name Caster Fayette ?Caller Phone Number [phone] ?Patient Name Kevin Terrell ?Patient DOB Jul 24, 1941 ?Call Type Message Only Information Provided ?Reason for Call Medication Question / Request ?Initial Comment Caller states that in the past week he has been in for really bad bronchitis and sinusitis. He ?was put on an antibiotic but the cough is really bad in his throat and chest. He has used OTC ?medication but it is not helping. He states that his head hurts very bad when he is coughing. ?Confirmed he does have pain in his chest while coughing. ?Additional Comment He declined triage and states that he is certain the chest pain is strictly related to the cough. He ?states that he is a 36 year survivor of lung cancer. He did have a CT scan already to confirm ?that this wasn't related to his cancer history but hasn't heard anything about it. That test was ?done on Tuesday. He is trying to be patient but he said that he is concerned because there was ?an abnormality on the x ray which prompted the CT. He is hopeful the results have been sent ?to Dr. Silvio Pate to review. He uses [facility] if Dr. Silvio Pate wants him to have cough ?medicine. ?Disp. Time Disposition Final User ?12/19/2021 8:00:30 AM General Information Provided Yes Wynema Birch ?Call Closed By: Wynema Birch ?Transaction Date/Time: 12/19/2021 7:53:24 AM (ET ?

## 2021-12-19 NOTE — Telephone Encounter (Signed)
I spoke with pt;pt saw note in my chart about CT chest and pt has already spoken with oncologist and pt said he was advised by oncologist that thinks nodule is so small and nodules come and go. Pt said he was advised to do FU as instructed. Pt said he would appreciate me going over Dr Alla German comments with him and I did and pt said he really appreciated my calling to FU and he really appreciates and thanks Dr Silvio Pate. Pt said he is going to pick up cough med Dr Silvio Pate sent in this afternoon and he appreciates that also. Pt will cb if needed. Sendng note to Dr Silvio Pate as Juluis Rainier. ?

## 2021-12-20 ENCOUNTER — Ambulatory Visit: Payer: Medicare Other

## 2021-12-20 ENCOUNTER — Other Ambulatory Visit: Payer: Medicare Other

## 2021-12-26 ENCOUNTER — Encounter: Payer: Medicare Other | Admitting: Dermatology

## 2021-12-26 ENCOUNTER — Telehealth: Payer: Self-pay

## 2021-12-26 NOTE — Progress Notes (Signed)
? ? ?  Chronic Care Management ?Pharmacy Assistant  ? ?Name: Kevin Terrell  MRN: [id number] DOB: 11-17-1940 ? ?Reason for Encounter: CCM (Initial Questions) ?  ?Recent office visits:  ?12/19/21 Start: Hydocan 5-1.5 mg for cough.  ?12/17/21 Start: Amoxicillin 875-125 mg - Findings: Indistinct Nodule 6mm - Repeat in 3 months ?12/11/21 Viviana Simpler, MD Cough: Start: Prednisone 40 mg. Ordered: CT chest and DG chest.  ?11/01/21-PCP-Richard Letvak,MD-Unsteady Gait-taking gabapentin 300mg  BID.Discussed walking stick for balance- no medication changes ? ?Recent consult visits:  ?11/07/21-Dermatology-Tara Stewart,MD- follow up AK.Healing from cryotherapy.No medication changes ?10/30/21-Dermatology-Virginia Moye,MD-Skin Excision-start mupirocin 2% apply 1 application topically daily with dressing changes ?10/09/21-Dermatology-Virginia Moye,MD-follow up AK.No medication changes ?10/03/21-Dermatology-cryotherapy ?10/02/21-Orthopedics-Andrew Kubinski,DO- Left shoulder corticosteroid injection  ?09/30/21-General Surgery- no data found ?08/28/21-Chiropractor- Iris Pert- multiple visits ?08/20/21-Orthopedics-Andrew Kubinski,DO- Bilateral knee viscosupplementation injection- multiple visits  ? ?Hospital visits:  ?None in previous 6 months ? ?Medications: ?Outpatient Encounter Medications as of 12/26/2021  ?Medication Sig  ? amoxicillin-clavulanate (AUGMENTIN) 875-125 MG tablet Take 1 tablet by mouth 2 (two) times daily.  ? cetirizine (ZYRTEC) 10 MG tablet Take 10 mg by mouth daily.  ? cholecalciferol (VITAMIN D3) 25 MCG (1000 UT) tablet Take 1,000 Units by mouth daily.  ? diclofenac Sodium (VOLTAREN) 1 % GEL APPLY 4GM TO BOTH KNEES 4 TIMES DAILY ASNEEDED  ? famotidine (PEPCID) 20 MG tablet TAKE 1 TABLET BY MOUTH EVERY NIGHT AT BEDTIME  ? fluticasone (FLONASE) 50 MCG/ACT nasal spray Place 1 spray into both nostrils 2 (two) times daily.  ? gabapentin (NEURONTIN) 300 MG capsule TAKE 1 CAPSULE BY MOUTH TWICE DAILY  ? HYDROcodone bit-homatropine  (HYCODAN) 5-1.5 MG/5ML syrup Take 5 mLs by mouth at bedtime as needed for cough.  ? levothyroxine (SYNTHROID) 50 MCG tablet TAKE 1 TABLET BY MOUTH ONCE A DAY BEFOREBREAKFAST.  ? meloxicam (MOBIC) 15 MG tablet Take 1 tablet (15 mg total) by mouth daily. As needed  ? montelukast (SINGULAIR) 10 MG tablet TAKE 1 TABLET BY MOUTH EVERY NIGHT AT BEDTIME  ? Multiple Vitamin (MULTIVITAMIN WITH MINERALS) TABS tablet Take 1 tablet by mouth daily.  ? mupirocin ointment (BACTROBAN) 2 % Apply 1 application topically 3 (three) times daily.  ? predniSONE (DELTASONE) 20 MG tablet Take 2 tablets (40 mg total) by mouth daily.  ? Probiotic Product (PROBIOTIC PO) Take 1 capsule by mouth 2 (two) times daily.  ? rosuvastatin (CRESTOR) 5 MG tablet Take 1 tablet (5 mg total) by mouth daily.  ? triamcinolone ointment (KENALOG) 0.1 % APPLY TO HANDS AT BEDTIME AND COVER WITHGLOVES AS NEEDED FOR FLARES. AVOID FACE/GROIN/AXILLA.  ? ?No facility-administered encounter medications on file as of 12/26/2021.  ? ?No results found for: HGBA1C, MICROALBUR  ? ?BP Readings from Last 3 Encounters:  ?12/11/21 108/64  ?11/01/21 110/78  ?04/19/21 120/72  ? ? ? ?Patient contacted to confirm telephone appointment with Charlene Brooke, Pharm D, on 12/31/2021 at 1:00. ? ?Do you have any problems getting your medications? No ? ?What is your top health concern you would like to discuss at your upcoming visit? Patient does not have any at this time.  ? ?Have you seen any other providers since your last visit with PCP? No ? ?Star Rating Drugs:  ?Medication:  Last Fill: Day Supply ?Rosuvastatin 5 mg 12/05/2021 30 ? ?Care Gaps: ?Annual wellness visit in last year? Yes 01/10/2021 ?Most Recent BP reading: 108/64 on 12/11/2021 ? ?Charlene Brooke, CPP notified ? ?[name], RMA ?Clinical Pharmacy Assistant ?[phone] ? ? ?

## 2021-12-30 DIAGNOSIS — H2513 Age-related nuclear cataract, bilateral: Secondary | ICD-10-CM | POA: Diagnosis not present

## 2021-12-31 ENCOUNTER — Ambulatory Visit (INDEPENDENT_AMBULATORY_CARE_PROVIDER_SITE_OTHER): Payer: Medicare Other | Admitting: Pharmacist

## 2021-12-31 DIAGNOSIS — E039 Hypothyroidism, unspecified: Secondary | ICD-10-CM

## 2021-12-31 DIAGNOSIS — E7800 Pure hypercholesterolemia, unspecified: Secondary | ICD-10-CM

## 2021-12-31 DIAGNOSIS — M1990 Unspecified osteoarthritis, unspecified site: Secondary | ICD-10-CM

## 2021-12-31 DIAGNOSIS — I700 Atherosclerosis of aorta: Secondary | ICD-10-CM

## 2021-12-31 DIAGNOSIS — G620 Drug-induced polyneuropathy: Secondary | ICD-10-CM

## 2021-12-31 DIAGNOSIS — K219 Gastro-esophageal reflux disease without esophagitis: Secondary | ICD-10-CM

## 2021-12-31 DIAGNOSIS — J301 Allergic rhinitis due to pollen: Secondary | ICD-10-CM

## 2021-12-31 NOTE — Progress Notes (Signed)
? ?Chronic Care Management ?Pharmacy Note ? ?01/03/2022 ?Name:  Kevin Terrell MRN:  [id number] DOB:  1941-08-21 ? ?Summary: CCM Initial Visit ?-Pt endorses compliance with medications as prescribed ?-Pt is taking meloxicam 15 mg daily for joint pain; discussed risks of long term NSAID use including kidney impairment and CV risks ? ?Recommendations/Changes made from today's visit: ?-Trial Meloxicam 15 mg QOD - use Tylenol 1000 mg BID to compensate; optimally stop meloxicam altogether and only use PRN ? ?Plan: ?-[place] will call patient 6 months for general update ?-Pharmacist follow up televisit scheduled for 1 year ?-CPE 01/15/22 ? ? ? ?Subjective: ?Kevin Terrell is an 81 y.o. year old male who is a primary patient of Letvak, Theophilus Kinds, MD.  The CCM team was consulted for assistance with disease management and care coordination needs.   ? ?Engaged with patient by telephone for initial visit in response to provider referral for pharmacy case management and/or care coordination services.  ? ?Consent to Services:  ?The patient was given the following information about Chronic Care Management services today, agreed to services, and gave verbal consent: 1. CCM service includes personalized support from designated clinical staff supervised by the primary care provider, including individualized plan of care and coordination with other care providers 2. 24/7 contact phone numbers for assistance for urgent and routine care needs. 3. Service will only be billed when office clinical staff spend 20 minutes or more in a month to coordinate care. 4. Only one practitioner may furnish and bill the service in a calendar month. 5.The patient may stop CCM services at any time (effective at the end of the month) by phone call to the office staff. 6. The patient will be responsible for cost sharing (co-pay) of up to 20% of the service fee (after annual deductible is met). Patient agreed to services and consent  obtained. ? ?Patient Care Team: ?[name], MD as PCP - General (Internal Medicine) ?Nelva Bush, MD as PCP - Cardiology (Cardiology) ?Telford Nab, RN as Sales executive ?Jovan Colligan, Cleaster Corin, [facility] as Pharmacist (Pharmacist) ? ? ?Patient lives at home with his wife. 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 ? ?Recent office visits: ?12/19/21 Start: Hydocan 5-1.5 mg for cough.  ?12/17/21 Start: Amoxicillin 875-125 mg - Findings: Indistinct Nodule 50m - Repeat in 3 months ?12/11/21 RViviana Simpler MD OV: Cough: Start: Prednisone 40 mg x 3 days. Ordered: CT chest and DG chest. ?11/01/21-PCP-Richard Letvak,MD OV: Unsteady Gait-taking gabapentin 3057mBID. ?Discussed walking stick for balance- no medication changes ?  ?Recent consult visits: ?11/07/21-Dermatology-Tara Stewart,MD- follow up AK.Healing from cryotherapy.No medication changes ?10/30/21-Dermatology-Virginia Moye,MD-Skin Excision-start mupirocin 2% apply 1 application topically daily with dressing changes ?10/09/21-Dermatology-Virginia Moye,MD-follow up AK.No medication changes ?10/03/21-Dermatology-cryotherapy ?10/02/21-Orthopedics-Andrew Kubinski,DO- Left shoulder corticosteroid injection  ?09/30/21 Dr SaLysle PearlGeneral Surgery): f/u chronic cholecystitis. New onset abd pain- CT pending w/ oncology. ? ?08/28/21-Chiropractor- JoIris Pertmultiple visits ?08/20/21-Orthopedics-Andrew Kubinski,DO- Bilateral knee viscosupplementation injection- multiple visits  ? ?04/19/21 Dr RaJanese Banksoncology): f/u lung cancer. Yearly LDCT recommended. ? ?03/20/21 Dr End (Cardiology): hospital f/u. Stable calcificatons. LDL 66 (on statin), LDL 100 off statin. Rec'd rosuvastatin 5 mg daily. ? ?Hospital visits: ?None in previous 6 months ? ? ?Objective: ? ?Lab Results  ?Component Value Date  ? CREATININE 0.80 10/22/2021  ? BUN 24 (H) 10/18/2021  ? GFR 69.93 12/13/2019  ? GFRNONAA >60 10/18/2021  ? NA 137 10/18/2021  ? K 4.6 10/18/2021  ? CALCIUM 9.0 10/18/2021  ? CO2  30 10/18/2021  ? GLUCOSE 101 (H) 10/18/2021  ? ? ?Lab Results  ?Component Value Date/Time  ? GFR 69.93 12/13/2019 02:01 PM  ? GFR 82.81 10/21/2019 10:13 AM  ?  ?Last diabetic Eye exam: No results found for: HMDIABEYEEXA  ?Last diabetic Foot exam: No results found for: HMDIABFOOTEX  ? ?Lab Results  ?Component Value Date  ? CHOL 149 07/17/2021  ? HDL 53 07/17/2021  ? Haslet 83 07/17/2021  ? TRIG 65 07/17/2021  ? CHOLHDL 2.8 07/17/2021  ? ? ? ?  Latest Ref Rng & Units 10/18/2021  ?  9:01 AM 07/17/2021  ?  9:28 AM 04/15/2021  ?  7:54 AM  ?Hepatic Function  ?Total Protein 6.5 - 8.1 g/dL 6.8    6.8    ?Albumin 3.5 - 5.0 g/dL 3.7    4.1    ?AST 15 - 41 U/L 18    21    ?ALT 0 - 44 U/L _0 ?Alk Phosphatase 38 - 126 U/L 61    55    ?Total Bilirubin 0.3 - 1.2 mg/dL <0.1    0.8    ? ? ?Lab Results  ?Component Value Date/Time  ? TSH 1.55 10/21/2019 10:13 AM  ? TSH 1.25 10/04/2018 09:45 AM  ? ? ? ?  Latest Ref Rng & Units 10/18/2021  ?  9:01 AM 04/15/2021  ?  7:54 AM 11/15/2020  ? 10:03 AM  ?CBC  ?WBC 4.0 - 10.5 K/uL 7.8   5.3   6.2    ?Hemoglobin 13.0 - 17.0 g/dL 12.9   12.8   10.5    ?Hematocrit 39.0 - 52.0 % 40.1   39.9   33.4    ?Platelets 150 - 400 K/uL 168   175   386    ? ? ?No results found for: VD25OH ? ?Clinical ASCVD: Yes  ?The 10-year ASCVD risk score (Arnett DK, et al., 2019) is: 22.5% ?  Values used to calculate the score: ?    Age: 73 years ?    Sex: Male ?    Is Non-Hispanic African American: No ?    Diabetic: No ?    Tobacco smoker: No ?    Systolic Blood Pressure: 545 mmHg ?    Is BP treated: No ?    HDL Cholesterol: 53 mg/dL ?    Total Cholesterol: 149 mg/dL   ? ? ?  01/10/2021  ?  2:35 PM 10/28/2019  ?  2:03 PM 10/26/2018  ?  9:51 AM  ?Depression screen PHQ 2/9  ?Decreased Interest 0 0 0  ?Down, Depressed, Hopeless 0 0 0  ?PHQ - 2 Score 0 0 0  ?  ? ?Social History  ? ?Tobacco Use  ?Smoking Status Never  ?Smokeless Tobacco Never  ? ?BP Readings from Last 3 Encounters:  ?12/11/21 108/64  ?11/01/21 110/78   ?04/19/21 120/72  ? ?Pulse Readings from Last 3 Encounters:  ?12/11/21 (!) 48  ?11/01/21 78  ?04/19/21 61  ? ?Wt Readings from Last 3 Encounters:  ?12/11/21 186 lb 11.2 oz (84.7 kg)  ?11/01/21 194 lb (88 kg)  ?04/19/21 187 lb 0.9 oz (84.8 kg)  ? ?BMI Readings from Last 3 Encounters:  ?12/11/21 24.63 kg/m?  ?11/01/21 25.60 kg/m?  ?04/19/21 24.68 kg/m?  ? ? ?Assessment/Interventions: Review of patient past medical history, allergies, medications, health status, including review of consultants reports, laboratory and other test data, was performed as part of comprehensive evaluation  and provision of chronic care management services.  ? ?SDOH:  (Social Determinants of Health) assessments and interventions performed: Yes ?SDOH Interventions   ? ?Flowsheet Row Most Recent Value  ?SDOH Interventions   ?Food Insecurity Interventions Intervention Not Indicated  ?Financial Strain Interventions Intervention Not Indicated  ? ?  ? ?SDOH Screenings  ? ?Alcohol Screen: Not on file  ?Depression (PHQ2-9): Low Risk   ? PHQ-2 Score: 0  ?Financial Resource Strain: Low Risk   ? Difficulty of Paying Living Expenses: Not hard at all  ?Food Insecurity: No Food Insecurity  ? Worried About Charity fundraiser in the Last Year: Never true  ? Ran Out of Food in the Last Year: Never true  ?Housing: Not on file  ?Physical Activity: Not on file  ?Social Connections: Not on file  ?Stress: Not on file  ?Tobacco Use: Low Risk   ? Smoking Tobacco Use: Never  ? Smokeless Tobacco Use: Never  ? Passive Exposure: Not on file  ?Transportation Needs: Not on file  ? ? ?[place] ? ?Allergies  ?Allergen Reactions  ? Compazine [Prochlorperazine Edisylate]   ?  muscle spasmus ?  ? Other   ?  [place] - hives  ? Augmentin [Amoxicillin-Pot Clavulanate] Diarrhea  ?  Tolerates Amoxicillin well  ? ? ?Medications Reviewed Today   ? ? Reviewed by Charlton Haws, [facility] (Pharmacist) on 12/31/21 at 1423  Med List Status: <None>  ? ?Medication Order Taking? Sig  Documenting Provider Last Dose Status Informant  ?cetirizine (ZYRTEC) 10 MG tablet [id number] Yes Take 10 mg by mouth daily. [provider] Taking Active EMS/Transport Team  ?cholecalciferol (VITAMIN D3) 25 MC

## 2022-01-01 DIAGNOSIS — M6281 Muscle weakness (generalized): Secondary | ICD-10-CM | POA: Diagnosis not present

## 2022-01-01 DIAGNOSIS — R2689 Other abnormalities of gait and mobility: Secondary | ICD-10-CM | POA: Diagnosis not present

## 2022-01-02 DIAGNOSIS — M9901 Segmental and somatic dysfunction of cervical region: Secondary | ICD-10-CM | POA: Diagnosis not present

## 2022-01-02 DIAGNOSIS — M62830 Muscle spasm of back: Secondary | ICD-10-CM | POA: Diagnosis not present

## 2022-01-02 DIAGNOSIS — M5033 Other cervical disc degeneration, cervicothoracic region: Secondary | ICD-10-CM | POA: Diagnosis not present

## 2022-01-02 DIAGNOSIS — M9902 Segmental and somatic dysfunction of thoracic region: Secondary | ICD-10-CM | POA: Diagnosis not present

## 2022-01-03 DIAGNOSIS — R2689 Other abnormalities of gait and mobility: Secondary | ICD-10-CM | POA: Diagnosis not present

## 2022-01-03 DIAGNOSIS — M6281 Muscle weakness (generalized): Secondary | ICD-10-CM | POA: Diagnosis not present

## 2022-01-03 NOTE — Patient Instructions (Signed)
Visit Information ? ?Phone number for Pharmacist: [phone] ? ?Thank you for meeting with me to discuss your medications! I look forward to working with you to achieve your health care goals. Below is a summary of what we talked about during the visit: ? ? Goals Addressed   ?None ?  ? ? ?Care Plan : [place]  ?Updates made by Charlton Haws, RPH since 01/03/2022 12:00 AM  ?  ? ?Problem: Hyperlipidemia, Coronary Artery Disease, GERD, Hypothyroidism, and Osteoarthritis   ?Priority: High  ?  ? ?Long-Range Goal: Disease mgmt   ?Start Date: 01/03/2022  ?Expected End Date: 01/04/2023  ?This Visit's Progress: On track  ?Priority: High  ?Note:   ?Current Barriers:  ?Suboptimal therapeutic regimen for arthritis/pain ? ?Pharmacist Clinical Goal(s):  ?Patient will adhere to plan to optimize therapeutic regimen for arthritis as evidenced by report of adherence to recommended medication management changes through collaboration with PharmD and provider.  ? ?Interventions: ?1:1 collaboration with [name], MD regarding development and update of comprehensive plan of care as evidenced by provider attestation and co-signature ?Inter-disciplinary care team collaboration (see longitudinal plan of care) ?Comprehensive medication review performed; medication list updated in electronic medical record ? ?Hyperlipidemia: (LDL goal < 100 - per cardiology) ?-Controlled - LDL 83 (07/2021) at goal, improved from 100 after statin ?-Aortic atherosclerosis, coronary artery calcification; ASCVD 22.5% ?-Current treatment: ?Rosuvastatin 5 mg daily - Appropriate, Effective, Safe, Accessible ?Omega-3 - Appropriate, Effective, Safe, Accessible ?-Medications previously tried: rosuvastatin 10 mg  ?-Educated on Cholesterol goals; Benefits of statin for ASCVD risk reduction; ?-Recommended to continue current medication ? ?Hypothyroidism (Goal: maintain TSH in goal range) ?-Controlled - TSH 1.55 (10/2019) at goal ?-Current treatment   ?Levothyroxine 50 mcg daily - Appropriate, Effective, Safe, Accessible ?-Medications previously tried: n/a  ?-Discussed long half life of LT4 ~7 days;  ?-Recommended to continue current medication ? ?Pain (Goal: manage symptoms) ?-Not ideally controlled - gabapentin originally for cough/GERD, developed neuropathy 10+ years after chemotherapy ?-Hx peripheral neuropathy (d/t chemo); osteoarthritis ?-Current treatment  ?Gabapentin 300 mg BID - Appropriate, Effective, Safe, Accessible ?Voltaren gel once daily (knees) - Appropriate, Effective, Safe, Accessible ?Meloxicam 15 mg daily - Appropriate, Effective, Query Safe ?-Medications previously tried: n/a  ?-Discussed risks of chronic NSAID use; it would be beneficial to reduce meloxicam use to lower risk for kidney, heart, GI complications ?-Advised to try meloxicam every other day; may supplement pain relief with Tylenol 1000 mg up to TID ? ?GERD (Goal: manage symptoms) ?-Controlled - pt sometimes uses Pepcid Plus PRN ?-Current treatment  ?Famotidine 20 mg daily HS - Appropriate, Effective, Safe, Accessible ?-Medications previously tried: n/a  ?-Recommended to continue current medication ? ?Allergic rhinitis (Goal: manage symptoms) ?-Controlled - pt wants to add Allegra on top of cetirizine seasonally - has discussed with PCP and likely OK ?-Current treatment  ?Cetirizine 10 mg daily PM - Appropriate, Effective, Safe, Accessible ?Montelukast 10 mg daily - Appropriate, Effective, Safe, Accessible ?Flonase (seasonally) - Appropriate, Effective, Safe, Accessible ?-Medications previously tried: n/a  ?-Discussed it is ok to try Allegra in addition to cetirizine; monitor for drowsiness/sedation ? ?Health Maintenance ?-Vaccine gaps: None ?-Hx lung cancer - continue yearly LDCT. CT 11/2021 w/ small nodule, repeat 3 months. ?-Augmentin causes diarrhea (even with BID probiotic)- Does OK with plain amoxicillin. ?-Current therapy:  ?Probiotic + Prebiotic ?Vitamin D 1000  IU ?Multivitamin ?Mupirocin 2% ointment ?Triamincolone 0.1% ointment ?-Patient is satisfied with current therapy and denies issues ?-Recommended to continue current medication ? ?  Patient Goals/Self-Care Activities ?Patient will:  ?- take medications as prescribed as evidenced by patient report and record review ?focus on medication adherence by pill packs ? ?  ?  ? ?Mr. Farias was given information about Chronic Care Management services today including:  ?CCM service includes personalized support from designated clinical staff supervised by his physician, including individualized plan of care and coordination with other care providers ?24/7 contact phone numbers for assistance for urgent and routine care needs. ?Standard insurance, coinsurance, copays and deductibles apply for chronic care management only during months in which we provide at least 20 minutes of these services. Most insurances cover these services at 100%, however patients may be responsible for any copay, coinsurance and/or deductible if applicable. This service may help you avoid the need for more expensive face-to-face services. ?Only one practitioner may furnish and bill the service in a calendar month. ?The patient may stop CCM services at any time (effective at the end of the month) by phone call to the office staff. ? ?Patient agreed to services and verbal consent obtained.  ? ?Patient verbalizes understanding of instructions and care plan provided today and agrees to view in [place]. Active MyChart status confirmed with patient.   ?Telephone follow up appointment with pharmacy team member scheduled for: 1 year ? ?Charlene Brooke, PharmD, BCACP ?Clinical Pharmacist ?[facility] ?[phone]  ?

## 2022-01-07 DIAGNOSIS — M9902 Segmental and somatic dysfunction of thoracic region: Secondary | ICD-10-CM | POA: Diagnosis not present

## 2022-01-07 DIAGNOSIS — M5033 Other cervical disc degeneration, cervicothoracic region: Secondary | ICD-10-CM | POA: Diagnosis not present

## 2022-01-07 DIAGNOSIS — M9901 Segmental and somatic dysfunction of cervical region: Secondary | ICD-10-CM | POA: Diagnosis not present

## 2022-01-07 DIAGNOSIS — M62830 Muscle spasm of back: Secondary | ICD-10-CM | POA: Diagnosis not present

## 2022-01-08 DIAGNOSIS — M6281 Muscle weakness (generalized): Secondary | ICD-10-CM | POA: Diagnosis not present

## 2022-01-08 DIAGNOSIS — R2689 Other abnormalities of gait and mobility: Secondary | ICD-10-CM | POA: Diagnosis not present

## 2022-01-10 ENCOUNTER — Encounter: Payer: Self-pay | Admitting: Internal Medicine

## 2022-01-14 DIAGNOSIS — M62830 Muscle spasm of back: Secondary | ICD-10-CM | POA: Diagnosis not present

## 2022-01-14 DIAGNOSIS — M9902 Segmental and somatic dysfunction of thoracic region: Secondary | ICD-10-CM | POA: Diagnosis not present

## 2022-01-14 DIAGNOSIS — M5033 Other cervical disc degeneration, cervicothoracic region: Secondary | ICD-10-CM | POA: Diagnosis not present

## 2022-01-14 DIAGNOSIS — M9901 Segmental and somatic dysfunction of cervical region: Secondary | ICD-10-CM | POA: Diagnosis not present

## 2022-01-15 ENCOUNTER — Encounter: Payer: Self-pay | Admitting: Internal Medicine

## 2022-01-15 ENCOUNTER — Ambulatory Visit (INDEPENDENT_AMBULATORY_CARE_PROVIDER_SITE_OTHER): Payer: Medicare Other | Admitting: Internal Medicine

## 2022-01-15 VITALS — BP 110/80 | HR 56 | Ht 72.0 in | Wt 186.4 lb

## 2022-01-15 DIAGNOSIS — Z0000 Encounter for general adult medical examination without abnormal findings: Secondary | ICD-10-CM | POA: Diagnosis not present

## 2022-01-15 DIAGNOSIS — K219 Gastro-esophageal reflux disease without esophagitis: Secondary | ICD-10-CM

## 2022-01-15 DIAGNOSIS — M160 Bilateral primary osteoarthritis of hip: Secondary | ICD-10-CM | POA: Diagnosis not present

## 2022-01-15 DIAGNOSIS — G620 Drug-induced polyneuropathy: Secondary | ICD-10-CM

## 2022-01-15 DIAGNOSIS — E538 Deficiency of other specified B group vitamins: Secondary | ICD-10-CM

## 2022-01-15 DIAGNOSIS — T451X5A Adverse effect of antineoplastic and immunosuppressive drugs, initial encounter: Secondary | ICD-10-CM | POA: Diagnosis not present

## 2022-01-15 DIAGNOSIS — I700 Atherosclerosis of aorta: Secondary | ICD-10-CM | POA: Diagnosis not present

## 2022-01-15 DIAGNOSIS — Z1159 Encounter for screening for other viral diseases: Secondary | ICD-10-CM

## 2022-01-15 DIAGNOSIS — R911 Solitary pulmonary nodule: Secondary | ICD-10-CM | POA: Diagnosis not present

## 2022-01-15 DIAGNOSIS — E559 Vitamin D deficiency, unspecified: Secondary | ICD-10-CM

## 2022-01-15 LAB — COMPREHENSIVE METABOLIC PANEL
ALT: 16 U/L (ref 0–53)
AST: 18 U/L (ref 0–37)
Albumin: 4.3 g/dL (ref 3.5–5.2)
Alkaline Phosphatase: 59 U/L (ref 39–117)
BUN: 15 mg/dL (ref 6–23)
CO2: 31 mEq/L (ref 19–32)
Calcium: 9.4 mg/dL (ref 8.4–10.5)
Chloride: 103 mEq/L (ref 96–112)
Creatinine, Ser: 0.87 mg/dL (ref 0.40–1.50)
GFR: 82.02 mL/min (ref >60.00)
Glucose, Bld: 98 mg/dL (ref 70–99)
Potassium: 4.5 mEq/L (ref 3.5–5.1)
Sodium: 139 mEq/L (ref 135–145)
Total Bilirubin: 0.4 mg/dL (ref 0.2–1.2)
Total Protein: 7.3 g/dL (ref 6.0–8.3)

## 2022-01-15 LAB — LIPID PANEL
Cholesterol: 123 mg/dL (ref 0–200)
HDL: 46.6 mg/dL (ref >39.00)
LDL Cholesterol: 61 mg/dL (ref 0–99)
NonHDL: 76.1
Total CHOL/HDL Ratio: 3
Triglycerides: 78 mg/dL (ref 0.0–149.0)
VLDL: 15.6 mg/dL (ref 0.0–40.0)

## 2022-01-15 LAB — VITAMIN D 25 HYDROXY (VIT D DEFICIENCY, FRACTURES): VITD: 47.41 ng/mL (ref 30.00–100.00)

## 2022-01-15 LAB — CBC
HCT: 39.8 % (ref 39.0–52.0)
Hemoglobin: 13.1 g/dL (ref 13.0–17.0)
MCHC: 33 g/dL (ref 30.0–36.0)
MCV: 90.7 fl (ref 78.0–100.0)
Platelets: 174 10*3/uL (ref 150.0–400.0)
RBC: 4.39 Mil/uL (ref 4.22–5.81)
RDW: 13.2 % (ref 11.5–15.5)
WBC: 4.8 10*3/uL (ref 4.0–10.5)

## 2022-01-15 LAB — VITAMIN B12: Vitamin B-12: 893 pg/mL (ref 211–911)

## 2022-01-15 NOTE — Assessment & Plan Note (Signed)
I have personally reviewed the Medicare Annual Wellness questionnaire and have noted ?1. The patient's medical and social history ?2. Their use of alcohol, tobacco or illicit drugs ?3. Their current medications and supplements ?4. The patient's functional ability including ADL's, fall risks, home safety risks and hearing or visual ?            impairment. ?5. Diet and physical activities ?6. Evidence for depression or mood disorders ? ?The patients weight, height, BMI and visual acuity have been recorded in the chart ?I have made referrals, counseling and provided education to the patient based review of the above and I have provided the pt with a written personalized care plan for preventive services. ? ?I have provided you with a copy of your personalized plan for preventive services. Please take the time to review along with your updated medication list. ? ?Done with cancer screening ?Getting COVID booster soon ?Flu vaccine in the fall ?Exercises regularly ?

## 2022-01-15 NOTE — Assessment & Plan Note (Signed)
Will check level ?

## 2022-01-15 NOTE — Assessment & Plan Note (Signed)
And coronary calcium ?Does take rosuvastatin 5mg  daily ?

## 2022-01-15 NOTE — Assessment & Plan Note (Signed)
Will recheck CT in July ?

## 2022-01-15 NOTE — Assessment & Plan Note (Signed)
Uses the gabapentin 300 bid ?

## 2022-01-15 NOTE — Assessment & Plan Note (Signed)
Has now cut meloxicam 15 mg to every other day along with tylenol 1000 bid ?

## 2022-01-15 NOTE — Progress Notes (Signed)
? ?Subjective:  ? ? Patient ID: Kevin Terrell, male    DOB: May 26, 1941, 81 y.o.   MRN: [id number] ? ?HPI ?Here for Medicare wellness visit and follow up of chronic health conditions ?Reviewed advanced directives ?Reviewed other doctors---Dr Rao--oncology, Dr End--cardiology, Dr Dolphus Jenny, Dr Beshel--chiropractor, Dr Matthew Folks, Dr Hyatt--podiatrist, Dr Myrtice Lauth, Dr Venetia Constable, Dr Quintella Reichert ?No surgery or hospitalizations this year ?Occasional glass of wine ?No tobacco products ?Exercises regularly ?Vision is fading--may be ready for cataracts ?Hearing is fine ?No falls ?No depression or anhedonia ?Independent with instrumental ADLs ?No sig memory issues ? ?Having ongoing knee and shoulder pains ?Keeps up with orthopedic surgeon for steroid/gel injections ?Satisfied with rx for now ?Has cut back the meloxicam for safety--and increased tylenol ? ?Known aortic atherosclerosis ?Continues on the rosuvastatin ? ?Ongoing neuropathy from chemo ?Working with PT for leg strength ?Uses gabapentin 300 bid ? ?Pepcid nightly ?Rare spell if eats the wrong thing---will use pepcid plus prn ?No dysphagia ? ?Right lung nodule ?Needs follow up in July ? ?Current Outpatient Medications on File Prior to Visit  ?Medication Sig Dispense Refill  ? cetirizine (ZYRTEC) 10 MG tablet Take 10 mg by mouth daily.    ? cholecalciferol (VITAMIN D3) 25 MCG (1000 UT) tablet Take 1,000 Units by mouth daily.    ? diclofenac Sodium (VOLTAREN) 1 % GEL APPLY 4GM TO BOTH KNEES 4 TIMES DAILY ASNEEDED 200 g 11  ? famotidine (PEPCID) 20 MG tablet TAKE 1 TABLET BY MOUTH EVERY NIGHT AT BEDTIME 90 tablet 3  ? fexofenadine (ALLEGRA) 180 MG tablet Take 180 mg by mouth daily.    ? fluticasone (FLONASE) 50 MCG/ACT nasal spray Place 1 spray into both nostrils 2 (two) times daily.    ? gabapentin (NEURONTIN) 300 MG capsule TAKE 1 CAPSULE BY MOUTH TWICE DAILY 180 capsule 3  ? levothyroxine (SYNTHROID) 50 MCG tablet TAKE 1 TABLET BY MOUTH  ONCE A DAY BEFOREBREAKFAST. 90 tablet 3  ? meloxicam (MOBIC) 15 MG tablet Take 1 tablet (15 mg total) by mouth daily. As needed 90 tablet 3  ? montelukast (SINGULAIR) 10 MG tablet TAKE 1 TABLET BY MOUTH EVERY NIGHT AT BEDTIME 90 tablet 3  ? Multiple Vitamin (MULTIVITAMIN WITH MINERALS) TABS tablet Take 1 tablet by mouth daily.    ? mupirocin ointment (BACTROBAN) 2 % Apply 1 application topically 3 (three) times daily. 22 g 0  ? Probiotic Product (PROBIOTIC PO) Take 1 capsule by mouth 2 (two) times daily.    ? rosuvastatin (CRESTOR) 5 MG tablet Take 1 tablet (5 mg total) by mouth daily. 90 tablet 0  ? triamcinolone ointment (KENALOG) 0.1 % APPLY TO HANDS AT BEDTIME AND COVER WITHGLOVES AS NEEDED FOR FLARES. AVOID FACE/GROIN/AXILLA. 80 g 0  ? ?No current facility-administered medications on file prior to visit.  ? ? ?Allergies  ?Allergen Reactions  ? Compazine [Prochlorperazine Edisylate]   ?  muscle spasmus ?  ? Other   ?  [place] - hives  ? Augmentin [Amoxicillin-Pot Clavulanate] Diarrhea  ?  Tolerates Amoxicillin well  ? ? ?Past Medical History:  ?Diagnosis Date  ? Actinic keratosis   ? Arthritis   ? 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 GERD (gastroesophageal reflux disease)   ? with atypical cough  ? Hypothyroidism   ? Lung cancer [facility]) 2009  ? chemo, NO radiation. treated in [place]  ? Seborrheic dermatitis   ? 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 right upper arm, excised 10/31/21  ? ? ?  Past Surgical History:  ?Procedure Laterality Date  ? CHOLECYSTECTOMY    ? COLONOSCOPY    ? COLONOSCOPY WITH PROPOFOL N/A 11/30/2019  ? Procedure: COLONOSCOPY WITH PROPOFOL;  Surgeon: Lin Landsman, MD;  Location: [facility] ENDOSCOPY;  Service: Gastroenterology;  Laterality: N/A;  ? EYE SURGERY    ? vitreous detachment---laser  ? KNEE CARTILAGE SURGERY Right 1968  ? KNEE SURGERY    ? bilateral meniscal surgeries  ? LUNG LOBECTOMY    ? upper right lung 12/15/2007   ? 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 UPPER GI  ENDOSCOPY    ? ? ?Family History  ?Problem Relation Age of Onset  ? Cancer Mother   ? Alzheimer's disease Mother   ? Stroke Maternal Grandmother   ? Hypertension Father   ? Alzheimer's disease Father   ? ? ?Social History  ? ?Socioeconomic History  ? Marital status: Married  ?  Spouse name: Not on file  ? Number of children: 3  ? Years of education: Not on file  ? Highest education level: Not on file  ?Occupational History  ? Occupation: Animal nutritionist  ?  Comment: retired  ?Tobacco Use  ? Smoking status: Never  ? Smokeless tobacco: Never  ?Vaping Use  ? Vaping Use: Never used  ?Substance and Sexual Activity  ? Alcohol use: Yes  ?  Comment: Once glass of wine per month, none last 24hrs  ? Drug use: Never  ? Sexual activity: Yes  ?Other Topics Concern  ? Not on file  ?Social History Narrative  ? Married  ? Veterinarian  ? Wife is nurse   ? Has a daughter in [place]; son and daughter in [place]   ? 6 grandchildren  ? Orginially from [facility]; spent some time in [place]; moved here 2019.   ? Lives at [place] [address]  ?   ? Has advanced directives  ? Daughter [name] is health care POA  ? Would accept resuscitation attempts  ? No feeding tube if cognitively unaware  ? ?Social Determinants of Health  ? ?Financial Resource Strain: Low Risk   ? Difficulty of Paying Living Expenses: Not hard at all  ?Food Insecurity: No Food Insecurity  ? Worried About Charity fundraiser in the Last Year: Never true  ? Ran Out of Food in the Last Year: Never true  ?Transportation Needs: Not on file  ?Physical Activity: Not on file  ?Stress: Not on file  ?Social Connections: Not on file  ?Intimate Partner Violence: Not on file  ? ?Review of Systems ?Appetite is good ?Weight is down a few pounds ?Wears seat belt ?Sleeps well ?Teeth fine--keeps up with dentist ?Bowels are fine--no blood. Some urgency since gallbladder surgery ?No suspicious skin lesions now---did have SCC removed ?   ?Objective:  ? Physical Exam ?Constitutional:   ?   Appearance: Normal  appearance.  ?HENT:  ?   Mouth/Throat:  ?   Comments: No lesions ?Eyes:  ?   Conjunctiva/sclera: Conjunctivae normal.  ?   Pupils: Pupils are equal, round, and reactive to light.  ?Cardiovascular:  ?   Rate and Rhythm: Normal rate and regular rhythm.  ?   Pulses: Normal pulses.  ?   Heart sounds: No murmur heard. ?  No gallop.  ?Pulmonary:  ?   Effort: Pulmonary effort is normal.  ?   Breath sounds: Normal breath sounds. No wheezing or rales.  ?Abdominal:  ?   Palpations: Abdomen is soft.  ?   Tenderness: There  is no abdominal tenderness.  ?Musculoskeletal:  ?   Cervical back: Neck supple.  ?   Right lower leg: No edema.  ?   Left lower leg: No edema.  ?Lymphadenopathy:  ?   Cervical: No cervical adenopathy.  ?Skin: ?   Findings: No lesion or rash.  ?Neurological:  ?   General: No focal deficit present.  ?   Mental Status: He is alert and oriented to person, place, and time.  ?   Comments: Mini-cog normal  ?Psychiatric:     ?   Mood and Affect: Mood normal.     ?   Behavior: Behavior normal.  ?  ? ? ? ? ?   ?Assessment & Plan:  ? ?

## 2022-01-15 NOTE — Assessment & Plan Note (Signed)
On supplements ?Will recheck level ?

## 2022-01-15 NOTE — Assessment & Plan Note (Signed)
Controlled with pepcid 20 at bedtime ?

## 2022-01-16 DIAGNOSIS — M6281 Muscle weakness (generalized): Secondary | ICD-10-CM | POA: Diagnosis not present

## 2022-01-16 DIAGNOSIS — R2689 Other abnormalities of gait and mobility: Secondary | ICD-10-CM | POA: Diagnosis not present

## 2022-01-16 LAB — HEPATITIS C ANTIBODY
Hepatitis C Ab: NONREACTIVE
SIGNAL TO CUT-OFF: 0.14 (ref <1.00)

## 2022-01-17 ENCOUNTER — Other Ambulatory Visit: Payer: Medicare Other

## 2022-01-28 DIAGNOSIS — M6281 Muscle weakness (generalized): Secondary | ICD-10-CM | POA: Diagnosis not present

## 2022-01-28 DIAGNOSIS — R2689 Other abnormalities of gait and mobility: Secondary | ICD-10-CM | POA: Diagnosis not present

## 2022-01-28 DIAGNOSIS — Z23 Encounter for immunization: Secondary | ICD-10-CM | POA: Diagnosis not present

## 2022-01-29 DIAGNOSIS — M5033 Other cervical disc degeneration, cervicothoracic region: Secondary | ICD-10-CM | POA: Diagnosis not present

## 2022-01-29 DIAGNOSIS — E785 Hyperlipidemia, unspecified: Secondary | ICD-10-CM | POA: Diagnosis not present

## 2022-01-29 DIAGNOSIS — M9901 Segmental and somatic dysfunction of cervical region: Secondary | ICD-10-CM | POA: Diagnosis not present

## 2022-01-29 DIAGNOSIS — E039 Hypothyroidism, unspecified: Secondary | ICD-10-CM | POA: Diagnosis not present

## 2022-01-29 DIAGNOSIS — M62830 Muscle spasm of back: Secondary | ICD-10-CM | POA: Diagnosis not present

## 2022-01-29 DIAGNOSIS — M9902 Segmental and somatic dysfunction of thoracic region: Secondary | ICD-10-CM | POA: Diagnosis not present

## 2022-01-30 DIAGNOSIS — R2689 Other abnormalities of gait and mobility: Secondary | ICD-10-CM | POA: Diagnosis not present

## 2022-01-30 DIAGNOSIS — M6281 Muscle weakness (generalized): Secondary | ICD-10-CM | POA: Diagnosis not present

## 2022-01-31 ENCOUNTER — Ambulatory Visit (INDEPENDENT_AMBULATORY_CARE_PROVIDER_SITE_OTHER): Payer: Medicare Other | Admitting: *Deleted

## 2022-01-31 DIAGNOSIS — H35373 Puckering of macula, bilateral: Secondary | ICD-10-CM | POA: Diagnosis not present

## 2022-01-31 DIAGNOSIS — B351 Tinea unguium: Secondary | ICD-10-CM

## 2022-01-31 DIAGNOSIS — M79676 Pain in unspecified toe(s): Secondary | ICD-10-CM

## 2022-01-31 DIAGNOSIS — H52223 Regular astigmatism, bilateral: Secondary | ICD-10-CM | POA: Diagnosis not present

## 2022-01-31 NOTE — Progress Notes (Signed)
Patient presents today for the 10th (additional treatments) laser treatment. 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   All the nails are looking a lot better. He is pleased with the progress.  All other systems are negative.  Nails were filed thin. Laser therapy was administered to 1-5 toenails bilateral and patient tolerated the treatment well. All safety precautions were in place.   Since patient has had multiple rounds of laser, I recommended laser maintenance every 3 months for preventative. He will follow up in 3 months.

## 2022-02-03 ENCOUNTER — Other Ambulatory Visit: Payer: Self-pay | Admitting: Internal Medicine

## 2022-02-03 ENCOUNTER — Other Ambulatory Visit: Payer: Self-pay | Admitting: Family

## 2022-02-03 DIAGNOSIS — E039 Hypothyroidism, unspecified: Secondary | ICD-10-CM

## 2022-02-03 DIAGNOSIS — Z902 Acquired absence of lung [part of]: Secondary | ICD-10-CM

## 2022-02-03 NOTE — Telephone Encounter (Signed)
Request sent to wrong office

## 2022-02-04 DIAGNOSIS — M6281 Muscle weakness (generalized): Secondary | ICD-10-CM | POA: Diagnosis not present

## 2022-02-04 DIAGNOSIS — R2689 Other abnormalities of gait and mobility: Secondary | ICD-10-CM | POA: Diagnosis not present

## 2022-02-05 DIAGNOSIS — M9902 Segmental and somatic dysfunction of thoracic region: Secondary | ICD-10-CM | POA: Diagnosis not present

## 2022-02-05 DIAGNOSIS — M9901 Segmental and somatic dysfunction of cervical region: Secondary | ICD-10-CM | POA: Diagnosis not present

## 2022-02-05 DIAGNOSIS — M5033 Other cervical disc degeneration, cervicothoracic region: Secondary | ICD-10-CM | POA: Diagnosis not present

## 2022-02-05 DIAGNOSIS — M62830 Muscle spasm of back: Secondary | ICD-10-CM | POA: Diagnosis not present

## 2022-02-06 DIAGNOSIS — M6281 Muscle weakness (generalized): Secondary | ICD-10-CM | POA: Diagnosis not present

## 2022-02-06 DIAGNOSIS — R2689 Other abnormalities of gait and mobility: Secondary | ICD-10-CM | POA: Diagnosis not present

## 2022-02-06 NOTE — Telephone Encounter (Signed)
Please schedule lab only appointment to check thyroid levels. Needs appointment before we can refill his medication.  Send back to me once scheduled.

## 2022-02-06 NOTE — Telephone Encounter (Signed)
Last office visit 01/15/22 for CPE.  Last refilled 01/24/21 for #90 with 3 refills.  Last thyroid labs 10/21/19. Ok to refill?

## 2022-02-10 ENCOUNTER — Other Ambulatory Visit: Payer: Self-pay | Admitting: Internal Medicine

## 2022-02-10 DIAGNOSIS — E039 Hypothyroidism, unspecified: Secondary | ICD-10-CM

## 2022-02-12 ENCOUNTER — Encounter: Payer: Self-pay | Admitting: *Deleted

## 2022-02-18 ENCOUNTER — Other Ambulatory Visit: Payer: Medicare Other

## 2022-02-21 ENCOUNTER — Other Ambulatory Visit (INDEPENDENT_AMBULATORY_CARE_PROVIDER_SITE_OTHER): Payer: Medicare Other

## 2022-02-21 DIAGNOSIS — M6281 Muscle weakness (generalized): Secondary | ICD-10-CM | POA: Diagnosis not present

## 2022-02-21 DIAGNOSIS — R2689 Other abnormalities of gait and mobility: Secondary | ICD-10-CM | POA: Diagnosis not present

## 2022-02-21 DIAGNOSIS — E039 Hypothyroidism, unspecified: Secondary | ICD-10-CM

## 2022-02-22 LAB — T4, FREE: Free T4: 1 ng/dL (ref 0.8–1.8)

## 2022-02-22 LAB — TSH: TSH: 1.74 mIU/L (ref 0.40–4.50)

## 2022-02-23 ENCOUNTER — Encounter: Payer: Self-pay | Admitting: Internal Medicine

## 2022-02-24 ENCOUNTER — Telehealth: Payer: Self-pay

## 2022-02-24 DIAGNOSIS — R2689 Other abnormalities of gait and mobility: Secondary | ICD-10-CM | POA: Diagnosis not present

## 2022-02-24 DIAGNOSIS — M6281 Muscle weakness (generalized): Secondary | ICD-10-CM | POA: Diagnosis not present

## 2022-02-24 NOTE — Telephone Encounter (Signed)
Patient is interested in joint pain supplement "Flexoplex" and wants to know if this would be safe to take with his other medications. He wants to try to get off of meloxicam completely.   Reviewed ingredients in FlexoPlex: glucosamine, chondroitin, MSM, cat's claw, boswellia. These products should be overall safe, most of these ingredients to have some degree of antiplatelet action and should be used with caution with NSAIDs. Discussed this with patient and agreed overall would consider him to be low risk for bleeding events, he will not take meloxicam and Flexoplex on the same day.  Pt voiced understanding of above and denies further questions.

## 2022-02-26 ENCOUNTER — Other Ambulatory Visit: Payer: Self-pay | Admitting: Dermatology

## 2022-02-26 DIAGNOSIS — M9902 Segmental and somatic dysfunction of thoracic region: Secondary | ICD-10-CM | POA: Diagnosis not present

## 2022-02-26 DIAGNOSIS — M62830 Muscle spasm of back: Secondary | ICD-10-CM | POA: Diagnosis not present

## 2022-02-26 DIAGNOSIS — M9901 Segmental and somatic dysfunction of cervical region: Secondary | ICD-10-CM | POA: Diagnosis not present

## 2022-02-26 DIAGNOSIS — M5033 Other cervical disc degeneration, cervicothoracic region: Secondary | ICD-10-CM | POA: Diagnosis not present

## 2022-02-27 ENCOUNTER — Ambulatory Visit (INDEPENDENT_AMBULATORY_CARE_PROVIDER_SITE_OTHER): Payer: Medicare Other | Admitting: Dermatology

## 2022-02-27 DIAGNOSIS — L814 Other melanin hyperpigmentation: Secondary | ICD-10-CM | POA: Diagnosis not present

## 2022-02-27 DIAGNOSIS — Z85828 Personal history of other malignant neoplasm of skin: Secondary | ICD-10-CM | POA: Diagnosis not present

## 2022-02-27 DIAGNOSIS — L570 Actinic keratosis: Secondary | ICD-10-CM | POA: Diagnosis not present

## 2022-02-27 DIAGNOSIS — L821 Other seborrheic keratosis: Secondary | ICD-10-CM

## 2022-02-27 DIAGNOSIS — M9901 Segmental and somatic dysfunction of cervical region: Secondary | ICD-10-CM | POA: Diagnosis not present

## 2022-02-27 DIAGNOSIS — M62830 Muscle spasm of back: Secondary | ICD-10-CM | POA: Diagnosis not present

## 2022-02-27 DIAGNOSIS — M9902 Segmental and somatic dysfunction of thoracic region: Secondary | ICD-10-CM | POA: Diagnosis not present

## 2022-02-27 DIAGNOSIS — Z1283 Encounter for screening for malignant neoplasm of skin: Secondary | ICD-10-CM

## 2022-02-27 DIAGNOSIS — M6281 Muscle weakness (generalized): Secondary | ICD-10-CM | POA: Diagnosis not present

## 2022-02-27 DIAGNOSIS — D229 Melanocytic nevi, unspecified: Secondary | ICD-10-CM | POA: Diagnosis not present

## 2022-02-27 DIAGNOSIS — D1800 Hemangioma unspecified site: Secondary | ICD-10-CM

## 2022-02-27 DIAGNOSIS — R2689 Other abnormalities of gait and mobility: Secondary | ICD-10-CM | POA: Diagnosis not present

## 2022-02-27 DIAGNOSIS — M5033 Other cervical disc degeneration, cervicothoracic region: Secondary | ICD-10-CM | POA: Diagnosis not present

## 2022-02-27 DIAGNOSIS — L578 Other skin changes due to chronic exposure to nonionizing radiation: Secondary | ICD-10-CM

## 2022-02-27 NOTE — Patient Instructions (Addendum)
Cryotherapy Aftercare  Wash gently with soap and water everyday.   Apply Vaseline and Band-Aid daily until healed.   Recommend taking Heliocare sun protection supplement daily in sunny weather for additional sun protection. For maximum protection on the sunniest days, you can take up to 2 capsules of regular Heliocare OR take 1 capsule of Heliocare Ultra. For prolonged exposure (such as a full day in the sun), you can repeat your dose of the supplement 4 hours after your first dose. Heliocare can be purchased at [employer], at some [facility] or at [email or web address].    Melanoma ABCDEs  Melanoma is the most dangerous type of skin cancer, and is the leading cause of death from skin disease.  You are more likely to develop melanoma if you: Have light-colored skin, light-colored eyes, or red or blond hair Spend a lot of time in the sun Tan regularly, either outdoors or in a tanning bed Have had blistering sunburns, especially during childhood Have a close family member who has had a melanoma Have atypical moles or large birthmarks  Early detection of melanoma is key since treatment is typically straightforward and cure rates are extremely high if we catch it early.   The first sign of melanoma is often a change in a mole or a new dark spot.  The ABCDE system is a way of remembering the signs of melanoma.  A for asymmetry:  The two halves do not match. B for border:  The edges of the growth are irregular. C for color:  A mixture of colors are present instead of an even brown color. D for diameter:  Melanomas are usually (but not always) greater than 81mm - the size of a pencil eraser. E for evolution:  The spot keeps changing in size, shape, and color.  Please check your skin once per month between visits. You can use a small mirror in front and a large mirror behind you to keep an eye on the back side or your body.   If you see any new or changing lesions before your next follow-up,  please call to schedule a visit.  Please continue daily skin protection including broad spectrum sunscreen SPF 30+ to sun-exposed areas, reapplying every 2 hours as needed when you're outdoors.    Due to recent changes in healthcare laws, you may see results of your pathology and/or laboratory studies on MyChart before the doctors have had a chance to review them. We understand that in some cases there may be results that are confusing or concerning to you. Please understand that not all results are received at the same time and often the doctors may need to interpret multiple results in order to provide you with the best plan of care or course of treatment. Therefore, we ask that you please give [place] 2 business days to thoroughly review all your results before contacting the office for clarification. Should we see a critical lab result, you will be contacted sooner.   If You Need Anything After Your Visit  If you have any questions or concerns for your doctor, please call our main line at [phone] and press option 4 to reach your doctor's medical assistant. If no one answers, please leave a voicemail as directed and we will return your call as soon as possible. Messages left after 4 pm will be answered the following business day.   You may also send [place] a message via [place]. We typically respond to MyChart messages within 1-2 business days.  For prescription refills, please ask your pharmacy to contact our office. Our fax number is [phone].  If you have an urgent issue when the clinic is closed that cannot wait until the next business day, you can page your doctor at the number below.    Please note that while we do our best to be available for urgent issues outside of office hours, we are not available 24/7.   If you have an urgent issue and are unable to reach [place], you may choose to seek medical care at your doctor's office, retail clinic, urgent care center, or emergency room.  If you  have a medical emergency, please immediately call 911 or go to the emergency department.  Pager Numbers  - Dr. Nehemiah Massed: [phone]  - Dr. Laurence Ferrari: [phone]  - Dr. Nicole Kindred: [phone]  In the event of inclement weather, please call our main line at [phone] for an update on the status of any delays or closures.  Dermatology Medication Tips: Please keep the boxes that topical medications come in in order to help keep track of the instructions about where and how to use these. Pharmacies typically print the medication instructions only on the boxes and not directly on the medication tubes.   If your medication is too expensive, please contact our office at [phone] option 4 or send [place] a message through [place].   We are unable to tell what your co-pay for medications will be in advance as this is different depending on your insurance coverage. However, we may be able to find a substitute medication at lower cost or fill out paperwork to get insurance to cover a needed medication.   If a prior authorization is required to get your medication covered by your insurance company, please allow [place] 1-2 business days to complete this process.  Drug prices often vary depending on where the prescription is filled and some pharmacies may offer cheaper prices.  The website www.goodrx.com contains coupons for medications through different pharmacies. The prices here do not account for what the cost may be with help from insurance (it may be cheaper with your insurance), but the website can give you the price if you did not use any insurance.  - You can print the associated coupon and take it with your prescription to the pharmacy.  - You may also stop by our office during regular business hours and pick up a GoodRx coupon card.  - If you need your prescription sent electronically to a different pharmacy, notify our office through [facility] or by phone at [phone] option  4.     Si Usted Necesita Algo Despus de Su Visita  Tambin puede enviarnos un mensaje a travs de Pharmacist, community. Por lo general respondemos a los mensajes de MyChart en el transcurso de 1 a 2 das hbiles.  Para renovar recetas, por favor pida a su farmacia que se ponga en contacto con nuestra oficina. Harland Dingwall de fax es [place] [phone].  Si tiene un asunto urgente cuando la clnica est cerrada y que no puede esperar hasta el siguiente da hbil, puede llamar/localizar a su doctor(a) al nmero que aparece a continuacin.   Por favor, tenga en cuenta que aunque hacemos todo lo posible para estar disponibles para asuntos urgentes fuera del horario de [place], no estamos disponibles las 24 horas del da, los 7 das de la [place].   Si tiene un problema urgente y no puede comunicarse con nosotros, puede optar por buscar atencin mdica  en  el consultorio de su doctor(a), en una clnica privada, en un centro de atencin urgente o en una sala de emergencias.  Si tiene Engineering geologist, por favor llame inmediatamente al 911 o vaya a la sala de emergencias.  Nmeros de bper  - Dr. Nehemiah Massed: [phone]  - Dra. Moye: [phone]  - Dra. Nicole Kindred: [phone]  En caso de inclemencias del [place], por favor llame a Johnsie Kindred principal al [phone] para una actualizacin sobre el [place] de cualquier retraso o cierre.  Consejos para la medicacin en dermatologa: Por favor, guarde las cajas en las que vienen los medicamentos de uso tpico para ayudarle a seguir las instrucciones sobre dnde y cmo usarlos. Las farmacias generalmente imprimen las instrucciones del medicamento slo en las cajas y no directamente en los tubos del [place].   Si su medicamento es muy caro, por favor, pngase en contacto con Zigmund Daniel llamando al [phone] y presione la opcin 4 o envenos un mensaje a travs de Pharmacist, community.   No podemos decirle cul ser su copago por los medicamentos por  adelantado ya que esto es diferente dependiendo de la cobertura de su seguro. Sin embargo, es posible que podamos encontrar un medicamento sustituto a Electrical engineer un formulario para que el seguro cubra el medicamento que se considera necesario.   Si se requiere una autorizacin previa para que su compaa de seguros [place] su medicamento, por favor permtanos de 1 a 2 das hbiles para completar este proceso.  Los precios de los medicamentos varan con frecuencia dependiendo del Environmental consultant de dnde se surte la receta y alguna farmacias pueden ofrecer precios ms baratos.  El sitio web www.goodrx.com tiene cupones para medicamentos de Airline pilot. Los precios aqu no tienen en cuenta lo que podra costar con la ayuda del seguro (puede ser ms barato con su seguro), pero el sitio web puede darle el precio si no utiliz Research scientist (physical sciences).  - Puede imprimir el cupn correspondiente y llevarlo con su receta a la farmacia.  - Tambin puede pasar por nuestra oficina durante el horario de atencin regular y Charity fundraiser una tarjeta de cupones de GoodRx.  - Si necesita que su receta se enve electrnicamente a una farmacia diferente, informe a nuestra oficina a travs de MyChart de [place] o por telfono llamando al [phone] y presione la opcin 4.

## 2022-02-27 NOTE — Progress Notes (Signed)
Follow-Up Visit   Subjective  Kevin Terrell is a 81 y.o. male who presents for the following: FBSE (The patient presents for Total-Body Skin Exam (TBSE) for skin cancer screening and mole check.  The patient has spots, moles and lesions to be evaluated, some may be new or changing and the patient has concerns that these could be cancer. Patient with hx of SCC and AK's. He has noticed a few rough spots at temples.).   The following portions of the chart were reviewed this encounter and updated as appropriate:   Tobacco  Allergies  Meds  Problems  Med Hx  Surg Hx  Fam Hx      Review of Systems:  No other skin or systemic complaints except as noted in HPI or Assessment and Plan.  Objective  Well appearing patient in no apparent distress; mood and affect are within normal limits.  A full examination was performed including scalp, head, eyes, ears, nose, lips, neck, chest, axillae, abdomen, back, buttocks, bilateral upper extremities, bilateral lower extremities, hands, feet, fingers, toes, fingernails, and toenails. All findings within normal limits unless otherwise noted below.  R forearm x 1, R neck x 1, R lateral pretibia x 2, L forearm x 1, L shoulder x 1, L chest x 1, L shoulder x 1, L post shoulder x 1 (9) Erythematous thin papules/macules with gritty scale.     Assessment & Plan  AK (actinic keratosis) (9) R forearm x 1, R neck x 1, R lateral pretibia x 2, L forearm x 1, L shoulder x 1, L chest x 1, L shoulder x 1, L post shoulder x 1  Actinic keratoses are precancerous spots that appear secondary to cumulative UV radiation exposure/sun exposure over time. They are chronic with expected duration over 1 year. A portion of actinic keratoses will progress to squamous cell carcinoma of the skin. It is not possible to reliably predict which spots will progress to skin cancer and so treatment is recommended to prevent development of skin cancer.  Recommend daily broad spectrum  sunscreen SPF 30+ to sun-exposed areas, reapply every 2 hours as needed.  Recommend staying in the shade or wearing long sleeves, sun glasses (UVA+UVB protection) and wide brim hats (4-inch brim around the entire circumference of the hat). Call for new or changing lesions.  Prior to procedure, discussed risks of blister formation, small wound, skin dyspigmentation, or rare scar following cryotherapy. Recommend Vaseline ointment to treated areas while healing.    Destruction of lesion - R forearm x 1, R neck x 1, R lateral pretibia x 2, L forearm x 1, L shoulder x 1, L chest x 1, L shoulder x 1, L post shoulder x 1  Destruction method: cryotherapy   Informed consent: discussed and consent obtained   Lesion destroyed using liquid nitrogen: Yes   Cryotherapy cycles:  2 Outcome: patient tolerated procedure well with no complications   Post-procedure details: wound care instructions given   Additional details:  Prior to procedure, discussed risks of blister formation, small wound, skin dyspigmentation, or rare scar following cryotherapy. Recommend Vaseline ointment to treated areas while healing.    History of Squamous Cell Carcinoma of the Skin - No evidence of recurrence today - No lymphadenopathy - Recommend regular full body skin exams - Recommend daily broad spectrum sunscreen SPF 30+ to sun-exposed areas, reapply every 2 hours as needed.  - Call if any new or changing lesions are noted between office visits  Lentigines - Scattered tan  macules - Due to sun exposure - Benign-appearing, observe - Recommend daily broad spectrum sunscreen SPF 30+ to sun-exposed areas, reapply every 2 hours as needed. - Call for any changes  Seborrheic Keratoses - Stuck-on, waxy, tan-brown papules and/or plaques  - Benign-appearing - Discussed benign etiology and prognosis. - Observe - Call for any changes  Melanocytic Nevi - Tan-brown and/or pink-flesh-colored symmetric macules and papules -  Benign appearing on exam today - Observation - Call clinic for new or changing moles - Recommend daily use of broad spectrum spf 30+ sunscreen to sun-exposed areas.   Hemangiomas - Red papules - Discussed benign nature - Observe - Call for any changes  Actinic Damage - Severe, confluent actinic changes with pre-cancerous actinic keratoses  - Severe, chronic, not at goal, secondary to cumulative UV radiation exposure over time - diffuse scaly erythematous macules and papules with underlying dyspigmentation - Discussed Prescription "Field Treatment" for Severe, Chronic Confluent Actinic Changes with Pre-Cancerous Actinic Keratoses Field treatment involves treatment of an entire area of skin that has confluent Actinic Changes (Sun/ Ultraviolet light damage) and PreCancerous Actinic Keratoses by method of PhotoDynamic Therapy (PDT) and/or prescription Topical Chemotherapy agents such as 5-fluorouracil, 5-fluorouracil/calcipotriene, and/or imiquimod.  The purpose is to decrease the number of clinically evident and subclinical PreCancerous lesions to prevent progression to development of skin cancer by chemically destroying early precancer changes that may or may not be visible.  It has been shown to reduce the risk of developing skin cancer in the treated area. As a result of treatment, redness, scaling, crusting, and open sores may occur during treatment course. One or more than one of these methods may be used and may have to be used several times to control, suppress and eliminate the PreCancerous changes. Discussed treatment course, expected reaction, and possible side effects. - Recommend daily broad spectrum sunscreen SPF 30+ to sun-exposed areas, reapply every 2 hours as needed.  - Staying in the shade or wearing long sleeves, sun glasses (UVA+UVB protection) and wide brim hats (4-inch brim around the entire circumference of the hat) are also recommended. - Call for new or changing lesions. -  Will plan PDT x 2 to temples in the fall.   Skin cancer screening performed today.  Return in about 6 months (around 08/29/2022) for TBSE, PDT x 2 to face in the fall.  Graciella Belton, RMA, am acting as scribe for Forest Gleason, MD .  Documentation: I have reviewed the above documentation for accuracy and completeness, and I agree with the above.  Forest Gleason, MD

## 2022-03-04 ENCOUNTER — Encounter: Payer: Self-pay | Admitting: Dermatology

## 2022-03-06 DIAGNOSIS — M9902 Segmental and somatic dysfunction of thoracic region: Secondary | ICD-10-CM | POA: Diagnosis not present

## 2022-03-06 DIAGNOSIS — M5033 Other cervical disc degeneration, cervicothoracic region: Secondary | ICD-10-CM | POA: Diagnosis not present

## 2022-03-06 DIAGNOSIS — M9901 Segmental and somatic dysfunction of cervical region: Secondary | ICD-10-CM | POA: Diagnosis not present

## 2022-03-06 DIAGNOSIS — M62830 Muscle spasm of back: Secondary | ICD-10-CM | POA: Diagnosis not present

## 2022-03-10 DIAGNOSIS — R2689 Other abnormalities of gait and mobility: Secondary | ICD-10-CM | POA: Diagnosis not present

## 2022-03-10 DIAGNOSIS — M6281 Muscle weakness (generalized): Secondary | ICD-10-CM | POA: Diagnosis not present

## 2022-03-11 DIAGNOSIS — M62830 Muscle spasm of back: Secondary | ICD-10-CM | POA: Diagnosis not present

## 2022-03-11 DIAGNOSIS — M5033 Other cervical disc degeneration, cervicothoracic region: Secondary | ICD-10-CM | POA: Diagnosis not present

## 2022-03-11 DIAGNOSIS — M9902 Segmental and somatic dysfunction of thoracic region: Secondary | ICD-10-CM | POA: Diagnosis not present

## 2022-03-11 DIAGNOSIS — M9901 Segmental and somatic dysfunction of cervical region: Secondary | ICD-10-CM | POA: Diagnosis not present

## 2022-03-13 ENCOUNTER — Other Ambulatory Visit: Payer: Self-pay | Admitting: Dermatology

## 2022-03-13 DIAGNOSIS — R2689 Other abnormalities of gait and mobility: Secondary | ICD-10-CM | POA: Diagnosis not present

## 2022-03-13 DIAGNOSIS — M6281 Muscle weakness (generalized): Secondary | ICD-10-CM | POA: Diagnosis not present

## 2022-03-18 DIAGNOSIS — M9901 Segmental and somatic dysfunction of cervical region: Secondary | ICD-10-CM | POA: Diagnosis not present

## 2022-03-18 DIAGNOSIS — M5033 Other cervical disc degeneration, cervicothoracic region: Secondary | ICD-10-CM | POA: Diagnosis not present

## 2022-03-18 DIAGNOSIS — M9902 Segmental and somatic dysfunction of thoracic region: Secondary | ICD-10-CM | POA: Diagnosis not present

## 2022-03-18 DIAGNOSIS — M62830 Muscle spasm of back: Secondary | ICD-10-CM | POA: Diagnosis not present

## 2022-03-18 NOTE — Progress Notes (Signed)
Cardiology Clinic Note   Patient Name: Kevin Terrell Date of Encounter: 03/20/2022  Primary Care Provider:  Rise Mu, PA-C Primary Cardiologist:  Nelva Bush, MD  Patient Profile    81 year old male with a past medical history of aortic atherosclerosis and coronary artery calcification, hyperlipidemia, lung cancer status post right upper lobe lobectomy and chemotherapy, hypothyroidism, GERD, arthritis, and peripheral neuropathy who presents today for follow-up of his hyperlipidemia.  Past Medical History    Past Medical History:  Diagnosis Date   Actinic keratosis    Arthritis    Chronic allergic rhinitis due to pollen    GERD (gastroesophageal reflux disease)    with atypical cough   Hypothyroidism    Lung cancer ([place]) 2009   chemo, NO radiation. 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 He was last seen in the clinic 03/20/21 by Dr End after a hospitalization which required cholecystectomy and treatment of pancreatitis. He continues to follow with oncology for routine low dose CT of the chest. He continues to eat a vegan diet and remains active. He was encouraged to restart his statin therapy since his LDL has risen above 100.   He returns to clinic today without complaints. He has also undergone a routine chest CT for follow up where he report that there was a nodule that was found. He has been scheduled for a 3 month follow up chest CT. He has continued on his vegan diet and continues to exercise. Denies any chest pain, shortness of breath, peripheral edema, or claudication. Denies any hospitalizations or visits to the emergency department.  Home Medications    Current Outpatient Medications  Medication Sig Dispense Refill   cetirizine (ZYRTEC) 10 MG tablet Take 10 mg by mouth daily.     cholecalciferol (VITAMIN D3) 25 MCG (1000 UT) tablet Take 1,000 Units by mouth daily.     diclofenac Sodium (VOLTAREN) 1 % GEL APPLY 4GM TO BOTH KNEES 4 TIMES DAILY ASNEEDED 200 g 11   famotidine (PEPCID) 20 MG tablet TAKE 1 TABLET BY MOUTH EVERY NIGHT AT BEDTIME 90 tablet 3   fexofenadine (ALLEGRA) 180 MG tablet Take 180 mg by mouth daily.     fluticasone (FLONASE) 50 MCG/ACT nasal  spray Place 1 spray into both nostrils 2 (two) times daily.     gabapentin (NEURONTIN) 300 MG capsule TAKE 1 CAPSULE BY MOUTH TWICE DAILY 180 capsule 3   ketoconazole (NIZORAL) 2 % cream APPLY A SMALL AMOUNT TO AFFECTED AREA TWICE DAILY 60 g 3   levothyroxine (SYNTHROID) 50 MCG tablet TAKE 1 TABLET BY MOUTH ONCE A DAY BEFOREBREAKFAST. 30 tablet 0   meloxicam (MOBIC) 15 MG tablet Take 1 tablet (15 mg total) by mouth daily. As needed 90 tablet 3   montelukast (SINGULAIR) 10 MG tablet TAKE 1 TABLET BY MOUTH EVERY NIGHT AT BEDTIME 90 tablet 3   Multiple Vitamin  (MULTIVITAMIN WITH MINERALS) TABS tablet Take 1 tablet by mouth daily.     mupirocin ointment (BACTROBAN) 2 % Apply 1 application topically 3 (three) times daily. 22 g 0   Probiotic Product (PROBIOTIC PO) Take 1 capsule by mouth 2 (two) times daily.     rosuvastatin (CRESTOR) 5 MG tablet Take 1 tablet (5 mg total) by mouth daily. 90 tablet 0   triamcinolone ointment (KENALOG) 0.1 % APPLY TO HANDS AT BEDTIME AND COVER WITHGLOVES AS NEEDED FOR FLARES. AVOID FACE/GROIN/AXILLA. 80 g 0   No current facility-administered medications for this visit.     Family History    Family History  Problem Relation Age of Onset   Cancer Mother    Alzheimer's disease Mother    Stroke Maternal Grandmother    Hypertension Father    Alzheimer's disease Father    He indicated that his mother is deceased. He indicated that his father is deceased. He indicated that the status of his maternal grandmother is unknown.  Social History    Social History   Socioeconomic History   Marital status: Married    Spouse name: Not on file   Number of children: 3   Years of education: Not on file   Highest education level: Not on file  Occupational History   Occupation: Animal nutritionist    Comment: retired  Tobacco Use   Smoking status: Never   Smokeless tobacco: Never  Scientific laboratory technician Use: Never used  Substance and Sexual Activity   Alcohol use: Yes    Comment: Once glass of wine per month, none last 24hrs   Drug use: Never   Sexual activity: Yes  Other Topics Concern   Not on file  Social History Narrative   Married   Animal nutritionist   Wife is nurse    Has a daughter in [place]; son and daughter in [place]    6 grandchildren   [place] from [place]; spent some time in [place]; moved here 2019.    Lives at [facility]      Has advanced directives   Daughter [name] is health care POA   Would accept resuscitation attempts   No feeding tube if cognitively unaware   Social Determinants of Health    Financial Resource Strain: Low Risk  (01/03/2022)   Overall Financial Resource Strain (CARDIA)    Difficulty of Paying Living Expenses: Not hard at all  Food Insecurity: No Food Insecurity (01/03/2022)   Hunger Vital Sign    Worried About Running Out of Food in the Last Year: Never true    Ran Out of Food in the Last Year: Never true  Transportation Needs: No Transportation Needs (10/26/2018)   PRAPARE - Hydrologist (Medical): No    Lack of Transportation (Non-Medical): No  Physical Activity: Unknown (10/26/2018)  Exercise Vital Sign    Days of Exercise per Week: 0 days    Minutes of Exercise per Session: Not on file  Stress: No Stress Concern Present (10/26/2018)   [place]    Feeling of Stress : Not at all  Social Connections: Not on file  Intimate Partner Violence: Not At Risk (10/26/2018)   Humiliation, Afraid, Rape, and Kick questionnaire    Fear of Current or Ex-Partner: No    Emotionally Abused: No    Physically Abused: No    Sexually Abused: No     Review of Systems    General:  No chills, fever, night sweats or weight changes.  Cardiovascular:  No chest pain, dyspnea on exertion, edema, orthopnea, palpitations, paroxysmal nocturnal dyspnea. Dermatological: No rash, lesions/masses Respiratory: No cough, dyspnea Urologic: No hematuria, dysuria Abdominal:   No nausea, vomiting, diarrhea, bright red blood per rectum, melena, or hematemesis Neurologic:  No visual changes, wkns, changes in mental status. All other systems reviewed and are otherwise negative except as noted above.     Physical Exam    VS:  BP 100/62 (BP Location: Left Arm, Patient Position: Sitting, Cuff Size: Normal)   Pulse (!) 57   Ht 6\' 1"  (1.854 m)   Wt 188 lb (85.3 kg)   SpO2 97%   BMI 24.80 kg/m  , BMI Body mass index is 24.8 kg/m.     GEN: Well nourished, well developed, in no acute  distress. HEENT: normal. Neck: Supple, no JVD, carotid bruits, or masses. Cardiac: RRR, no murmurs, rubs, or gallops. No clubbing, cyanosis, edema.  Radials/DP/PT 2+ and equal bilaterally.  Respiratory:  Respirations regular and unlabored, clear to auscultation bilaterally. GI: Soft, nontender, nondistended, BS + x 4. MS: no deformity or atrophy. Skin: warm and dry, no rash. Neuro:  Strength and sensation are intact. Psych: Normal affect.  Accessory Clinical Findings    ECG personally reviewed by me today- sinus bradycardia with occasional unifocal PVC rate of 57 - No acute changes  Lab Results  Component Value Date   WBC 4.8 01/15/2022   HGB 13.1 01/15/2022   HCT 39.8 01/15/2022   MCV 90.7 01/15/2022   PLT 174.0 01/15/2022   Lab Results  Component Value Date   CREATININE 0.87 01/15/2022   BUN 15 01/15/2022   NA 139 01/15/2022   K 4.5 01/15/2022   CL 103 01/15/2022   CO2 31 01/15/2022   Lab Results  Component Value Date   ALT 16 01/15/2022   AST 18 01/15/2022   GGT <5 (L) 11/01/2020   ALKPHOS 59 01/15/2022   BILITOT 0.4 01/15/2022   Lab Results  Component Value Date   CHOL 123 01/15/2022   HDL 46.60 01/15/2022   LDLCALC 61 01/15/2022   TRIG 78.0 01/15/2022   CHOLHDL 3 01/15/2022    No results found for: "HGBA1C"  Assessment & Plan   Coronary artery calcification, aortic atherosclerosis, and hyperlipidemia He remains without angina, dyspnea, peripheral edema, or change in exercise tolerance. Repeat chest CT continues to shows stable calcifications involving the coronary arteries. This was discussed with the patient as well. Most recent lipid panel reveals LDL 61 on 01/15/22. Encouraged to continue rosuvastatin 5 mg daily. Encouraged to continue exercise regimen and vegan diet.   Disposition patient to follow up with MD or APP in 1 year with an EKG on return  Jeancarlo Leffler, NP 03/20/2022, 4:40 PM

## 2022-03-19 DIAGNOSIS — H2512 Age-related nuclear cataract, left eye: Secondary | ICD-10-CM | POA: Diagnosis not present

## 2022-03-20 ENCOUNTER — Ambulatory Visit: Payer: Medicare Other | Admitting: Internal Medicine

## 2022-03-20 ENCOUNTER — Encounter: Payer: Self-pay | Admitting: Physician Assistant

## 2022-03-20 ENCOUNTER — Ambulatory Visit (INDEPENDENT_AMBULATORY_CARE_PROVIDER_SITE_OTHER): Payer: Medicare Other | Admitting: Cardiology

## 2022-03-20 VITALS — BP 100/62 | HR 57 | Ht 73.0 in | Wt 188.0 lb

## 2022-03-20 DIAGNOSIS — E7800 Pure hypercholesterolemia, unspecified: Secondary | ICD-10-CM

## 2022-03-20 DIAGNOSIS — I2510 Atherosclerotic heart disease of native coronary artery without angina pectoris: Secondary | ICD-10-CM | POA: Diagnosis not present

## 2022-03-20 DIAGNOSIS — I700 Atherosclerosis of aorta: Secondary | ICD-10-CM

## 2022-03-20 DIAGNOSIS — I2584 Coronary atherosclerosis due to calcified coronary lesion: Secondary | ICD-10-CM | POA: Diagnosis not present

## 2022-03-20 NOTE — Patient Instructions (Signed)
Medication Instructions:  No changes at this time.   *If you need a refill on your cardiac medications before your next appointment, please call your pharmacy*   Lab Work: None  If you have labs (blood work) drawn today and your tests are completely normal, you will receive your results only by: [place] (if you have MyChart) OR A paper copy in the mail If you have any lab test that is abnormal or we need to change your treatment, we will call you to review the results.   Testing/Procedures: None   Follow-Up: At [facility], you and your health needs are our priority.  As part of our continuing mission to provide you with exceptional heart care, we have created designated Provider Care Teams.  These Care Teams include your primary Cardiologist (physician) and Advanced Practice Providers (APPs -  Physician Assistants and Nurse Practitioners) who all work together to provide you with the care you need, when you need it.   Your next appointment:   6 month(s)  The format for your next appointment:   In Person  Provider:   Nelva Bush, MD or Gerrie Nordmann NP     Important Information About Sugar

## 2022-03-21 DIAGNOSIS — M19012 Primary osteoarthritis, left shoulder: Secondary | ICD-10-CM | POA: Diagnosis not present

## 2022-03-21 DIAGNOSIS — G8929 Other chronic pain: Secondary | ICD-10-CM | POA: Diagnosis not present

## 2022-03-21 DIAGNOSIS — M25512 Pain in left shoulder: Secondary | ICD-10-CM | POA: Diagnosis not present

## 2022-03-21 DIAGNOSIS — M7522 Bicipital tendinitis, left shoulder: Secondary | ICD-10-CM | POA: Diagnosis not present

## 2022-03-21 DIAGNOSIS — M67922 Unspecified disorder of synovium and tendon, left upper arm: Secondary | ICD-10-CM | POA: Diagnosis not present

## 2022-03-21 DIAGNOSIS — M24012 Loose body in left shoulder: Secondary | ICD-10-CM | POA: Diagnosis not present

## 2022-03-24 ENCOUNTER — Encounter: Payer: Self-pay | Admitting: Ophthalmology

## 2022-03-24 ENCOUNTER — Ambulatory Visit
Admission: RE | Admit: 2022-03-24 | Discharge: 2022-03-24 | Disposition: A | Discharge status: Home / Self Care | Payer: Medicare Other | Source: Ambulatory Visit | Attending: Internal Medicine | Admitting: Internal Medicine

## 2022-03-24 ENCOUNTER — Other Ambulatory Visit: Payer: Self-pay

## 2022-03-24 DIAGNOSIS — R911 Solitary pulmonary nodule: Secondary | ICD-10-CM | POA: Diagnosis not present

## 2022-03-25 DIAGNOSIS — M5033 Other cervical disc degeneration, cervicothoracic region: Secondary | ICD-10-CM | POA: Diagnosis not present

## 2022-03-25 DIAGNOSIS — M62830 Muscle spasm of back: Secondary | ICD-10-CM | POA: Diagnosis not present

## 2022-03-25 DIAGNOSIS — M9902 Segmental and somatic dysfunction of thoracic region: Secondary | ICD-10-CM | POA: Diagnosis not present

## 2022-03-25 DIAGNOSIS — M9901 Segmental and somatic dysfunction of cervical region: Secondary | ICD-10-CM | POA: Diagnosis not present

## 2022-03-27 NOTE — Discharge Instructions (Signed)

## 2022-03-31 DIAGNOSIS — M9902 Segmental and somatic dysfunction of thoracic region: Secondary | ICD-10-CM | POA: Diagnosis not present

## 2022-03-31 DIAGNOSIS — M5033 Other cervical disc degeneration, cervicothoracic region: Secondary | ICD-10-CM | POA: Diagnosis not present

## 2022-03-31 DIAGNOSIS — M62830 Muscle spasm of back: Secondary | ICD-10-CM | POA: Diagnosis not present

## 2022-03-31 DIAGNOSIS — M9901 Segmental and somatic dysfunction of cervical region: Secondary | ICD-10-CM | POA: Diagnosis not present

## 2022-03-31 NOTE — Anesthesia Preprocedure Evaluation (Signed)
Anesthesia Evaluation  Patient identified by MRN, date of birth, ID band Patient awake    Reviewed: Allergy & Precautions, NPO status , Patient's Chart, lab work & pertinent test results  History of Anesthesia Complications Negative for: history of anesthetic complications  Airway Mallampati: II  TM Distance: >3 FB Neck ROM: Full    Dental  (+) Teeth Intact, Dental Advidsory Given   Pulmonary neg pulmonary ROS, neg shortness of breath, neg sleep apnea, neg COPD, neg recent URI, Patient abstained from smoking.Not current smoker,  S/p R lobectomy for lung cancer   Pulmonary exam normal breath sounds clear to auscultation       Cardiovascular Exercise Tolerance: Good METS(-) hypertension(-) angina(-) CAD and (-) Past MI negative cardio ROS  (-) dysrhythmias  Rhythm:Regular Rate:Normal - Systolic murmurs    Neuro/Psych neg Seizures negative psych ROS   GI/Hepatic GERD  Controlled,(+)     (-) substance abuse  ,   Endo/Other  neg diabetesHypothyroidism   Renal/GU negative Renal ROS     Musculoskeletal  (+) Arthritis ,   Abdominal   Peds  Hematology   Anesthesia Other Findings Past Medical History: No date: Actinic keratosis No date: Arthritis No date: GERD (gastroesophageal reflux disease) No date: Hypothyroidism 2009: Lung cancer ([place])     Comment:  chemo, NO radiation. treated in [place] No date: Seborrheic dermatitis No date: Thyroid disease     Comment:  hypothyroid   Reproductive/Obstetrics                             Anesthesia Physical  Anesthesia Plan  ASA: II  Anesthesia Plan: MAC   Post-op Pain Management:    Induction: Intravenous  PONV Risk Score and Plan: Treatment may vary due to age or medical condition  Airway Management Planned: Natural Airway  Additional Equipment: None  Intra-op Plan:   Post-operative Plan:   Informed Consent:     Dental  advisory given  Plan Discussed with: CRNA and Surgeon  Anesthesia Plan Comments:         Anesthesia Quick Evaluation

## 2022-04-01 ENCOUNTER — Ambulatory Visit
Admission: RE | Admit: 2022-04-01 | Discharge: 2022-04-01 | Disposition: A | Discharge status: Home / Self Care | Payer: Medicare Other | Source: Ambulatory Visit | Attending: Ophthalmology | Admitting: Ophthalmology

## 2022-04-01 ENCOUNTER — Ambulatory Visit: Payer: Medicare Other | Admitting: Anesthesiology

## 2022-04-01 ENCOUNTER — Ambulatory Visit (AMBULATORY_SURGERY_CENTER): Payer: Medicare Other | Admitting: Anesthesiology

## 2022-04-01 ENCOUNTER — Encounter
Admission: RE | Disposition: A | Discharge status: Home / Self Care | Payer: Self-pay | Source: Ambulatory Visit | Attending: Ophthalmology

## 2022-04-01 ENCOUNTER — Other Ambulatory Visit: Payer: Self-pay

## 2022-04-01 ENCOUNTER — Encounter: Payer: Self-pay | Admitting: Ophthalmology

## 2022-04-01 DIAGNOSIS — K219 Gastro-esophageal reflux disease without esophagitis: Secondary | ICD-10-CM | POA: Diagnosis not present

## 2022-04-01 DIAGNOSIS — M1990 Unspecified osteoarthritis, unspecified site: Secondary | ICD-10-CM | POA: Insufficient documentation

## 2022-04-01 DIAGNOSIS — H2512 Age-related nuclear cataract, left eye: Secondary | ICD-10-CM | POA: Insufficient documentation

## 2022-04-01 DIAGNOSIS — G629 Polyneuropathy, unspecified: Secondary | ICD-10-CM | POA: Insufficient documentation

## 2022-04-01 DIAGNOSIS — Z85828 Personal history of other malignant neoplasm of skin: Secondary | ICD-10-CM | POA: Insufficient documentation

## 2022-04-01 DIAGNOSIS — Z902 Acquired absence of lung [part of]: Secondary | ICD-10-CM | POA: Diagnosis not present

## 2022-04-01 DIAGNOSIS — E039 Hypothyroidism, unspecified: Secondary | ICD-10-CM | POA: Diagnosis not present

## 2022-04-01 DIAGNOSIS — Z85118 Personal history of other malignant neoplasm of bronchus and lung: Secondary | ICD-10-CM | POA: Insufficient documentation

## 2022-04-01 HISTORY — PX: CATARACT EXTRACTION W/PHACO: SHX586

## 2022-04-01 SURGERY — PHACOEMULSIFICATION, CATARACT, WITH IOL INSERTION
Anesthesia: Monitor Anesthesia Care | Site: Eye | Laterality: Left

## 2022-04-01 MED ORDER — FENTANYL CITRATE (PF) 100 MCG/2ML IJ SOLN
INTRAMUSCULAR | Status: DC | PRN
  Administered 2022-04-01: 50 ug via INTRAVENOUS

## 2022-04-01 MED ORDER — BRIMONIDINE TARTRATE-TIMOLOL 0.2-0.5 % OP SOLN
OPHTHALMIC | Status: DC | PRN
  Administered 2022-04-01: 1 [drp] via OPHTHALMIC

## 2022-04-01 MED ORDER — SIGHTPATH DOSE#1 BSS IO SOLN
INTRAOCULAR | Status: DC | PRN
  Administered 2022-04-01: 52 mL via OPHTHALMIC

## 2022-04-01 MED ORDER — ARMC OPHTHALMIC DILATING DROPS
1.0000 | OPHTHALMIC | Status: DC | PRN
  Administered 2022-04-01 (×3): 1 via OPHTHALMIC

## 2022-04-01 MED ORDER — MIDAZOLAM HCL 2 MG/2ML IJ SOLN
INTRAMUSCULAR | Status: DC | PRN
  Administered 2022-04-01: 1 mg via INTRAVENOUS

## 2022-04-01 MED ORDER — SIGHTPATH DOSE#1 NA CHONDROIT SULF-NA HYALURON 40-17 MG/ML IO SOLN
INTRAOCULAR | Status: DC | PRN
  Administered 2022-04-01: 1 mL via INTRAOCULAR

## 2022-04-01 MED ORDER — MOXIFLOXACIN HCL 0.5 % OP SOLN
OPHTHALMIC | Status: DC | PRN
  Administered 2022-04-01: 0.2 mL via OPHTHALMIC

## 2022-04-01 MED ORDER — SIGHTPATH DOSE#1 BSS IO SOLN
INTRAOCULAR | Status: DC | PRN
  Administered 2022-04-01: 15 mL

## 2022-04-01 MED ORDER — SIGHTPATH DOSE#1 BSS IO SOLN
INTRAOCULAR | Status: DC | PRN
  Administered 2022-04-01: 1 mL via INTRAMUSCULAR

## 2022-04-01 MED ORDER — TETRACAINE HCL 0.5 % OP SOLN
1.0000 [drp] | OPHTHALMIC | Status: DC | PRN
  Administered 2022-04-01 (×3): 1 [drp] via OPHTHALMIC

## 2022-04-01 SURGICAL SUPPLY — 12 items
CATARACT SUITE SIGHTPATH (MISCELLANEOUS) ×2 IMPLANT
FEE CATARACT SUITE SIGHTPATH (MISCELLANEOUS) ×1 IMPLANT
GLOVE SURG ENC TEXT LTX SZ8 (GLOVE) ×2 IMPLANT
GLOVE SURG TRIUMPH 8.0 PF LTX (GLOVE) ×2 IMPLANT
LENS IOL ACRSF VT TRC 315 18.0 IMPLANT
LENS IOL ACRYSOF VIVITY 18.0 ×2 IMPLANT
LENS IOL VIVITY 315 18.0 ×1 IMPLANT
NDL FILTER BLUNT 18X1 1/2 (NEEDLE) ×1 IMPLANT
NEEDLE FILTER BLUNT 18X 1/2SAF (NEEDLE) ×1
NEEDLE FILTER BLUNT 18X1 1/2 (NEEDLE) ×1 IMPLANT
SYR 3ML LL SCALE MARK (SYRINGE) ×2 IMPLANT
WATER STERILE IRR 250ML POUR (IV SOLUTION) ×2 IMPLANT

## 2022-04-01 NOTE — Op Note (Signed)
PREOPERATIVE DIAGNOSIS:  Nuclear sclerotic cataract of the left eye.   POSTOPERATIVE DIAGNOSIS:  Nuclear sclerotic cataract of the left eye.   OPERATIVE PROCEDURE: Procedure(s): CATARACT EXTRACTION PHACO AND INTRAOCULAR LENS PLACEMENT (IOC) LEFT VIVITY TORIC   SURGEON:  Birder Robson, MD.   ANESTHESIA: 1.      Managed anesthesia care. 2.     0.20ml os Shugarcaine was instilled following the paracentesis [email or web address]   COMPLICATIONS:  None.   TECHNIQUE:   Stop and chop    DESCRIPTION OF PROCEDURE:  The patient was examined and consented in the preoperative holding area where the aforementioned topical anesthesia was applied to the left eye.  The patient was brought back to the Operating Room where he was sat upright on the gurney and given a target to fixate upon while the eye was marked at the 3:00 and 9:00 position.  The patient was then reclined on the operating table.  The eye was prepped and draped in the usual sterile ophthalmic fashion and a lid speculum was placed. A paracentesis was created with the side port blade and the anterior chamber was filled with viscoelastic. A near clear corneal incision was performed with the steel keratome. A continuous curvilinear capsulorrhexis was performed with a cystotome followed by the capsulorrhexis forceps. Hydrodissection and hydrodelineation were carried out with BSS on a blunt cannula. The lens was removed in a stop and chop technique and the remaining cortical material was removed with the irrigation-aspiration handpiece. The eye was inflated with viscoelastic and the DFT lens was placed in the eye and rotated to within a few degrees of the predetermined orientation.  The remaining viscoelastic was removed from the eye.  The Sinskey hook was used to rotate the toric lens into its final resting place at 139 degrees.  0.1 ml of Vigamox was placed in the anterior chamber. The eye was inflated to a physiologic pressure and found to be watertight.   The eye was dressed with Vigamox and Combigan  The patient was given protective glasses to wear throughout the day and a shield with which to sleep tonight. The patient was also given drops with which to begin a drop regimen today and will follow-up with me in one day. Implant Name Type Inv. Item Serial No. Manufacturer Lot No. LRB No. Used Action  LENS IOL ACRYSOF VIVITY 18.0 - [id number]  LENS IOL ACRYSOF VIVITY 18.0 [id number] [employer]  Left 1 Implanted   Procedure(s) with comments: CATARACT EXTRACTION PHACO AND INTRAOCULAR LENS PLACEMENT (IOC) LEFT VIVITY TORIC (Left) - 7.53 00:41.3  Electronically signed: Birder Robson 8/1/202312:24 PM

## 2022-04-01 NOTE — H&P (Signed)
[facility]   Primary Care Physician:  [name], MD Ophthalmologist: Dr. George Ina  Pre-Procedure History & Physical: HPI:  Kevin Terrell is a 81 y.o. male here for cataract surgery.   Past Medical History:  Diagnosis Date   Actinic keratosis    Arthritis    Chronic allergic rhinitis due to pollen    GERD (gastroesophageal reflux disease)    with atypical cough   Hypothyroidism    Lung cancer ([place]) 2009   chemo, NO radiation. 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 Authorizing Provider  cetirizine (ZYRTEC) 10 MG tablet Take 10 mg by mouth daily.   Yes [provider]  cholecalciferol (VITAMIN D3) 25 MCG (1000 UT) tablet Take 1,000 Units by mouth daily.   Yes [provider]  diclofenac Sodium (VOLTAREN) 1 % GEL APPLY 4GM TO BOTH KNEES 4 TIMES DAILY ASNEEDED 10/03/21  Yes Viviana Simpler I, MD  famotidine (PEPCID) 20 MG tablet TAKE 1 TABLET BY MOUTH EVERY NIGHT AT BEDTIME 06/27/21  Yes [name], MD  fexofenadine (ALLEGRA) 180 MG tablet Take 180 mg by mouth daily.   Yes [provider]  fluticasone (FLONASE) 50 MCG/ACT nasal spray Place 1 spray into both nostrils 2 (two) times daily.   Yes [provider]  gabapentin (NEURONTIN) 300 MG  capsule TAKE 1 CAPSULE BY MOUTH TWICE DAILY 09/05/21  Yes [name], MD  glucosamine-chondroitin 500-400 MG tablet Take 1 tablet by mouth 3 (three) times daily.   Yes [provider]  ketoconazole (NIZORAL) 2 % cream APPLY A SMALL AMOUNT TO AFFECTED AREA TWICE DAILY 02/26/22  Yes Moye, [place], MD  levothyroxine (SYNTHROID) 50 MCG tablet TAKE 1 TABLET BY MOUTH ONCE A DAY BEFOREBREAKFAST. 02/06/22  Yes Viviana Simpler I, MD  montelukast (SINGULAIR) 10 MG tablet TAKE 1 TABLET BY MOUTH EVERY NIGHT AT BEDTIME 02/03/22  Yes Dutch Quint B, FNP  Multiple Vitamin (MULTIVITAMIN WITH MINERALS) TABS tablet Take 1 tablet by mouth daily.   Yes [provider]  mupirocin ointment (BACTROBAN) 2 % Apply 1 application topically 3 (three) times daily. 12/06/20  Yes Moye, [place], MD  Probiotic Product (PROBIOTIC PO) Take 1 capsule by mouth 2 (two) times daily.   Yes [provider]  rosuvastatin (CRESTOR) 5 MG tablet Take 1 tablet (5 mg total) by mouth daily. 11/19/21 05/18/22 Yes End, Harrell Gave, MD  triamcinolone ointment (KENALOG) 0.1 % APPLY TO HANDS AT BEDTIME AND COVER WITHGLOVES AS NEEDED FOR FLARES. AVOID FACE/GROIN/AXILLA. 08/19/21  Yes Moye, [place], MD  meloxicam (MOBIC) 15 MG tablet Take 1 tablet (15 mg total) by mouth daily. As needed Patient not taking: Reported on 03/24/2022 01/10/21   Silvio Pate,  Theophilus Kinds, MD    Allergies as of 02/04/2022 - Review Complete 01/15/2022  Allergen Reaction Noted   Compazine [prochlorperazine edisylate]  05/17/2018   Other  05/17/2018   Augmentin [amoxicillin-pot clavulanate] Diarrhea 01/03/2022    Family History  Problem Relation Age of Onset   Cancer Mother    Alzheimer's disease Mother    Stroke Maternal Grandmother    Hypertension Father    Alzheimer's disease Father     Social History   Socioeconomic History   Marital status: Married    Spouse name: Not on file   Number of children: 3   Years of education: Not on file    Highest education level: Not on file  Occupational History   Occupation: Animal nutritionist    Comment: retired  Tobacco Use   Smoking status: Never   Smokeless tobacco: Never  Scientific laboratory technician Use: Never used  Substance and Sexual Activity   Alcohol use: Yes    Comment: Once glass of wine per month, none last 24hrs   Drug use: Never   Sexual activity: Yes  Other Topics Concern   Not on file  Social History Narrative   Married   Animal nutritionist   Wife is nurse    Has a daughter in [place]; son and daughter in [place]    6 grandchildren   [place] from [place]; spent some time in [place]; moved here 2019.    Lives at [facility]      Has advanced directives   Daughter [name] is health care POA   Would accept resuscitation attempts   No feeding tube if cognitively unaware   Social Determinants of Health   Financial Resource Strain: Low Risk  (01/03/2022)   Overall Financial Resource Strain (CARDIA)    Difficulty of Paying Living Expenses: Not hard at all  Food Insecurity: No Food Insecurity (01/03/2022)   Hunger Vital Sign    Worried About Running Out of Food in the Last Year: Never true    Ran Out of Food in the Last Year: Never true  Transportation Needs: No Transportation Needs (10/26/2018)   PRAPARE - Hydrologist (Medical): No    Lack of Transportation (Non-Medical): No  Physical Activity: Unknown (10/26/2018)   Exercise Vital Sign    Days of Exercise per Week: 0 days    Minutes of Exercise per Session: Not on file  Stress: No Stress Concern Present (10/26/2018)   [place]    Feeling of Stress : Not at all  Social Connections: Not on file  Intimate Partner Violence: Not At Risk (10/26/2018)   Humiliation, Afraid, Rape, and Kick questionnaire    Fear of Current or Ex-Partner: No    Emotionally Abused: No    Physically Abused: No    Sexually Abused: No    Review of Systems: See  HPI, otherwise negative ROS  Physical Exam: BP (!) 148/76   Pulse 61   Temp (!) 97.5 F (36.4 C) (Temporal)   Resp 10   Wt 85.7 kg   SpO2 100%   BMI 24.94 kg/m  General:   Alert, cooperative in NAD Head:  Normocephalic and atraumatic. Respiratory:  Normal work of breathing. Cardiovascular:  RRR  Impression/Plan: Kevin Terrell is here for cataract surgery.  Risks, benefits, limitations, and alternatives regarding cataract surgery have been reviewed with the patient.  Questions have been answered.  All parties agreeable.   Birder Robson, MD  04/01/2022,  11:52 AM

## 2022-04-01 NOTE — Anesthesia Postprocedure Evaluation (Signed)
Anesthesia Post Note  Patient: Kevin Terrell  Procedure(s) Performed: CATARACT EXTRACTION PHACO AND INTRAOCULAR LENS PLACEMENT (IOC) LEFT VIVITY TORIC (Left: Eye)     Patient location during evaluation: PACU Anesthesia Type: MAC Level of consciousness: awake and alert Pain management: pain level controlled Vital Signs Assessment: post-procedure vital signs reviewed and stable Respiratory status: spontaneous breathing, nonlabored ventilation and respiratory function stable Cardiovascular status: blood pressure returned to baseline and stable Postop Assessment: no apparent nausea or vomiting Anesthetic complications: no   No notable events documented.  [place] Ouch

## 2022-04-01 NOTE — Transfer of Care (Signed)
Immediate Anesthesia Transfer of Care Note  Patient: Kevin Terrell  Procedure(s) Performed: CATARACT EXTRACTION PHACO AND INTRAOCULAR LENS PLACEMENT (IOC) LEFT VIVITY TORIC (Left: Eye)  Patient Location: PACU  Anesthesia Type:MAC  Level of Consciousness: drowsy and patient cooperative  Airway & Oxygen Therapy: Patient Spontanous Breathing  Post-op Assessment: Report given to RN and Post -op Vital signs reviewed and stable  Post vital signs: Reviewed and stable  Last Vitals:  Vitals Value Taken Time  BP 119/78 04/01/22 1230  Temp 36.3 C 04/01/22 1230  Pulse 54 04/01/22 1230  Resp 16 04/01/22 1230  SpO2 100 % 04/01/22 1230    Last Pain:  Vitals:   04/01/22 1230  TempSrc:   PainSc: 0-No pain         Complications: No notable events documented.

## 2022-04-02 ENCOUNTER — Encounter: Payer: Self-pay | Admitting: Ophthalmology

## 2022-04-02 ENCOUNTER — Other Ambulatory Visit: Payer: Self-pay

## 2022-04-04 ENCOUNTER — Other Ambulatory Visit: Payer: Self-pay | Admitting: Internal Medicine

## 2022-04-04 DIAGNOSIS — E039 Hypothyroidism, unspecified: Secondary | ICD-10-CM

## 2022-04-07 DIAGNOSIS — H2511 Age-related nuclear cataract, right eye: Secondary | ICD-10-CM | POA: Diagnosis not present

## 2022-04-08 DIAGNOSIS — M9902 Segmental and somatic dysfunction of thoracic region: Secondary | ICD-10-CM | POA: Diagnosis not present

## 2022-04-08 DIAGNOSIS — M5033 Other cervical disc degeneration, cervicothoracic region: Secondary | ICD-10-CM | POA: Diagnosis not present

## 2022-04-08 DIAGNOSIS — M9901 Segmental and somatic dysfunction of cervical region: Secondary | ICD-10-CM | POA: Diagnosis not present

## 2022-04-08 DIAGNOSIS — M62830 Muscle spasm of back: Secondary | ICD-10-CM | POA: Diagnosis not present

## 2022-04-09 NOTE — Discharge Instructions (Signed)

## 2022-04-12 NOTE — Anesthesia Preprocedure Evaluation (Signed)
Anesthesia Evaluation  Patient identified by MRN, date of birth, ID band Patient awake    Reviewed: Allergy & Precautions, NPO status , Patient's Chart, lab work & pertinent test results  History of Anesthesia Complications Negative for: history of anesthetic complications  Airway Mallampati: II  TM Distance: >3 FB Neck ROM: Full    Dental  (+) Teeth Intact, Dental Advidsory Given   Pulmonary neg pulmonary ROS, neg shortness of breath, neg sleep apnea, neg COPD, neg recent URI, Patient abstained from smoking.Not current smoker,  S/p R lobectomy for lung cancer   Pulmonary exam normal breath sounds clear to auscultation       Cardiovascular Exercise Tolerance: Good METS(-) hypertension(-) angina(-) CAD and (-) Past MI negative cardio ROS  (-) dysrhythmias  Rhythm:Regular Rate:Normal - Systolic murmurs    Neuro/Psych neg Seizures  Neuromuscular disease (neuropathy) negative psych ROS   GI/Hepatic GERD  Controlled,(+)     (-) substance abuse  ,   Endo/Other  neg diabetesHypothyroidism   Renal/GU negative Renal ROS     Musculoskeletal  (+) Arthritis ,   Abdominal Normal abdominal exam  (+)   Peds  Hematology   Anesthesia Other Findings Past Medical History: No date: Actinic keratosis No date: Arthritis No date: GERD (gastroesophageal reflux disease) No date: Hypothyroidism 2009: Lung cancer ([place])     Comment:  chemo, NO radiation. treated in [place] No date: Seborrheic dermatitis No date: Thyroid disease     Comment:  hypothyroid   Reproductive/Obstetrics                             Anesthesia Physical  Anesthesia Plan  ASA: II  Anesthesia Plan: MAC   Post-op Pain Management:    Induction: Intravenous  PONV Risk Score and Plan: Treatment may vary due to age or medical condition  Airway Management Planned: Natural Airway  Additional Equipment: None  Intra-op Plan:    Post-operative Plan:   Informed Consent: I have reviewed the patients History and Physical, chart, labs and discussed the procedure including the risks, benefits and alternatives for the proposed anesthesia with the patient or authorized representative who has indicated his/her understanding and acceptance.       Plan Discussed with: CRNA and Surgeon  Anesthesia Plan Comments:        Anesthesia Quick Evaluation

## 2022-04-15 ENCOUNTER — Ambulatory Visit
Admission: RE | Admit: 2022-04-15 | Discharge: 2022-04-15 | Disposition: A | Discharge status: Home / Self Care | Payer: Medicare Other | Source: Ambulatory Visit | Attending: Ophthalmology | Admitting: Ophthalmology

## 2022-04-15 ENCOUNTER — Encounter: Payer: Self-pay | Admitting: Ophthalmology

## 2022-04-15 ENCOUNTER — Ambulatory Visit (AMBULATORY_SURGERY_CENTER): Payer: Medicare Other | Admitting: Anesthesiology

## 2022-04-15 ENCOUNTER — Ambulatory Visit: Payer: Medicare Other | Admitting: Anesthesiology

## 2022-04-15 ENCOUNTER — Other Ambulatory Visit: Payer: Self-pay

## 2022-04-15 ENCOUNTER — Encounter
Admission: RE | Disposition: A | Discharge status: Home / Self Care | Payer: Self-pay | Source: Ambulatory Visit | Attending: Ophthalmology

## 2022-04-15 DIAGNOSIS — M1990 Unspecified osteoarthritis, unspecified site: Secondary | ICD-10-CM | POA: Diagnosis not present

## 2022-04-15 DIAGNOSIS — H2511 Age-related nuclear cataract, right eye: Secondary | ICD-10-CM

## 2022-04-15 DIAGNOSIS — E039 Hypothyroidism, unspecified: Secondary | ICD-10-CM | POA: Diagnosis not present

## 2022-04-15 DIAGNOSIS — K219 Gastro-esophageal reflux disease without esophagitis: Secondary | ICD-10-CM | POA: Insufficient documentation

## 2022-04-15 DIAGNOSIS — Z9221 Personal history of antineoplastic chemotherapy: Secondary | ICD-10-CM | POA: Insufficient documentation

## 2022-04-15 DIAGNOSIS — Z85118 Personal history of other malignant neoplasm of bronchus and lung: Secondary | ICD-10-CM

## 2022-04-15 HISTORY — PX: CATARACT EXTRACTION W/PHACO: SHX586

## 2022-04-15 SURGERY — PHACOEMULSIFICATION, CATARACT, WITH IOL INSERTION
Anesthesia: Monitor Anesthesia Care | Site: Eye | Laterality: Right

## 2022-04-15 MED ORDER — SIGHTPATH DOSE#1 BSS IO SOLN
INTRAOCULAR | Status: DC | PRN
  Administered 2022-04-15: 1 mL via INTRAMUSCULAR

## 2022-04-15 MED ORDER — SIGHTPATH DOSE#1 BSS IO SOLN
INTRAOCULAR | Status: DC | PRN
  Administered 2022-04-15: 70 mL via OPHTHALMIC

## 2022-04-15 MED ORDER — MOXIFLOXACIN HCL 0.5 % OP SOLN
OPHTHALMIC | Status: DC | PRN
  Administered 2022-04-15: 0.2 mL via OPHTHALMIC

## 2022-04-15 MED ORDER — SIGHTPATH DOSE#1 BSS IO SOLN
INTRAOCULAR | Status: DC | PRN
  Administered 2022-04-15: 15 mL

## 2022-04-15 MED ORDER — BRIMONIDINE TARTRATE-TIMOLOL 0.2-0.5 % OP SOLN
OPHTHALMIC | Status: DC | PRN
  Administered 2022-04-15: 1 [drp] via OPHTHALMIC

## 2022-04-15 MED ORDER — MIDAZOLAM HCL 2 MG/2ML IJ SOLN
INTRAMUSCULAR | Status: DC | PRN
  Administered 2022-04-15: .5 mg via INTRAVENOUS

## 2022-04-15 MED ORDER — SIGHTPATH DOSE#1 NA CHONDROIT SULF-NA HYALURON 40-17 MG/ML IO SOLN
INTRAOCULAR | Status: DC | PRN
  Administered 2022-04-15: 1 mL via INTRAOCULAR

## 2022-04-15 MED ORDER — ARMC OPHTHALMIC DILATING DROPS
1.0000 | OPHTHALMIC | Status: DC | PRN
  Administered 2022-04-15 (×3): 1 via OPHTHALMIC

## 2022-04-15 MED ORDER — TETRACAINE HCL 0.5 % OP SOLN
1.0000 [drp] | OPHTHALMIC | Status: DC | PRN
  Administered 2022-04-15 (×3): 1 [drp] via OPHTHALMIC

## 2022-04-15 MED ORDER — FENTANYL CITRATE (PF) 100 MCG/2ML IJ SOLN
INTRAMUSCULAR | Status: DC | PRN
  Administered 2022-04-15: 100 ug via INTRAVENOUS

## 2022-04-15 SURGICAL SUPPLY — 12 items
CATARACT SUITE SIGHTPATH (MISCELLANEOUS) ×2 IMPLANT
FEE CATARACT SUITE SIGHTPATH (MISCELLANEOUS) ×1 IMPLANT
GLOVE SURG ENC TEXT LTX SZ8 (GLOVE) ×2 IMPLANT
GLOVE SURG TRIUMPH 8.0 PF LTX (GLOVE) ×2 IMPLANT
LENS IOL ACRSF VT TRC 315 17.0 IMPLANT
LENS IOL ACRYSOF VIVITY 17.0 ×2 IMPLANT
LENS IOL VIVITY 315 17.0 ×1 IMPLANT
NDL FILTER BLUNT 18X1 1/2 (NEEDLE) ×1 IMPLANT
NEEDLE FILTER BLUNT 18X 1/2SAF (NEEDLE) ×1
NEEDLE FILTER BLUNT 18X1 1/2 (NEEDLE) ×1 IMPLANT
SYR 3ML LL SCALE MARK (SYRINGE) ×2 IMPLANT
WATER STERILE IRR 250ML POUR (IV SOLUTION) ×2 IMPLANT

## 2022-04-15 NOTE — Op Note (Addendum)
PREOPERATIVE DIAGNOSIS:  Nuclear sclerotic cataract of the right eye.   POSTOPERATIVE DIAGNOSIS:  Nuclear sclerotic cataract of the right eye.   OPERATIVE PROCEDURE: Procedure(s): CATARACT EXTRACTION PHACO AND INTRAOCULAR LENS PLACEMENT (IOC) RIGHT VIVITY TORIC   SURGEON:  Birder Robson, MD.   ANESTHESIA: 1.      Managed anesthesia care. 2.     0.31ml of Shugarcaine was instilled following the paracentesis  Anesthesiologist: [place] Ouch, MD CRNA: Ester Rink, CRNA  COMPLICATIONS:  None.   TECHNIQUE:   Stop and chop    DESCRIPTION OF PROCEDURE:  The patient was examined and consented in the preoperative holding area where the aforementioned topical anesthesia was applied to the right eye.  The patient was brought back to the Operating Room where he was sat upright on the gurney and given a target to fixate upon while the eye was marked at the 3:00 and 9:00 position.  The patient was then reclined on the operating table.  The eye was prepped and draped in the usual sterile ophthalmic fashion and a lid speculum was placed. A paracentesis was created with the side port blade and the anterior chamber was filled with viscoelastic. A near clear corneal incision was performed with the steel keratome. A continuous curvilinear capsulorrhexis was performed with a cystotome followed by the capsulorrhexis forceps. Hydrodissection and hydrodelineation were carried out with BSS on a blunt cannula. The lens was removed in a stop and chop technique and the remaining cortical material was removed with the irrigation-aspiration handpiece. The eye was inflated with viscoelastic and the DFT  lens  was placed in the eye and rotated to within a few degrees of the predetermined orientation.  The remaining viscoelastic was removed from the eye.  The Sinskey hook was used to rotate the toric lens into its final resting place at 055 degrees.  0. The eye was inflated to a physiologic pressure and found to be  watertight. 0.57ml of Vigamox was placed in the anterior chamber.  The eye was dressed with Vigamox and Combigan. The patient was given protective glasses to wear throughout the day and a shield with which to sleep tonight. The patient was also given drops with which to begin a drop regimen today and will follow-up with me in one day. Implant Name Type Inv. Item Serial No. Manufacturer Lot No. LRB No. Used Action  LENS IOL ACRYSOF VIVITY 17.0 - [id number]  LENS IOL ACRYSOF VIVITY 17.0 [id number] [employer]  Right 1 Implanted   Procedure(s) with comments: CATARACT EXTRACTION PHACO AND INTRAOCULAR LENS PLACEMENT (IOC) RIGHT VIVITY TORIC (Right) - 5.35 00:36.3  Electronically signed: Birder Robson 04/15/2022 8:27 AM

## 2022-04-15 NOTE — Anesthesia Postprocedure Evaluation (Signed)
Anesthesia Post Note  Patient: Kevin Terrell  Procedure(s) Performed: CATARACT EXTRACTION PHACO AND INTRAOCULAR LENS PLACEMENT (IOC) RIGHT VIVITY TORIC (Right: Eye)     Patient location during evaluation: PACU Anesthesia Type: MAC Level of consciousness: awake and alert Pain management: pain level controlled Vital Signs Assessment: post-procedure vital signs reviewed and stable Respiratory status: spontaneous breathing, nonlabored ventilation and respiratory function stable Cardiovascular status: blood pressure returned to baseline and stable Postop Assessment: no apparent nausea or vomiting Anesthetic complications: no   There were no known notable events for this encounter.  [place] Ouch

## 2022-04-15 NOTE — Transfer of Care (Addendum)
Immediate Anesthesia Transfer of Care Note  Patient: Kevin Terrell  Procedure(s) Performed: CATARACT EXTRACTION PHACO AND INTRAOCULAR LENS PLACEMENT (IOC) RIGHT VIVITY TORIC (Right: Eye)  Patient Location: PACU  Anesthesia Type: MAC  Level of Consciousness: awake, alert  and patient cooperative  Airway and Oxygen Therapy: Patient Spontanous Breathing and Patient connected to supplemental oxygen  Post-op Assessment: Post-op Vital signs reviewed, Patient's Cardiovascular Status Stable, Respiratory Function Stable, Patent Airway and No signs of Nausea or vomiting  Post-op Vital Signs: Reviewed and stable  Complications: There were no known notable events for this encounter.

## 2022-04-15 NOTE — H&P (Signed)
[facility]   Primary Care Physician:  [name], MD Ophthalmologist: Dr. George Ina  Pre-Procedure History & Physical: HPI:  Kevin Terrell is a 81 y.o. male here for cataract surgery.   Past Medical History:  Diagnosis Date   Actinic keratosis    Arthritis    Chronic allergic rhinitis due to pollen    GERD (gastroesophageal reflux disease)    with atypical cough   Hypothyroidism    Lung cancer ([place]) 2009   chemo, NO radiation. 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   right upper arm, excised 10/31/21    Past Surgical History:  Procedure Laterality Date   CATARACT EXTRACTION W/PHACO Left 04/01/2022   Procedure: CATARACT EXTRACTION PHACO AND INTRAOCULAR LENS PLACEMENT ([place]) LEFT VIVITY TORIC;  Surgeon: Birder Robson, MD;  Location: [place];  Service: Ophthalmology;  Laterality: Left;  7.53 00:41.3   CHOLECYSTECTOMY     COLONOSCOPY     COLONOSCOPY WITH PROPOFOL N/A 11/30/2019   Procedure: COLONOSCOPY WITH PROPOFOL;  Surgeon: Lin Landsman, MD;  Location: [facility] ENDOSCOPY;  Service: Gastroenterology;  Laterality: N/A;   EYE SURGERY     vitreous detachment---laser   KNEE CARTILAGE SURGERY Right 1968   KNEE SURGERY     bilateral meniscal surgeries   LUNG LOBECTOMY     upper right lung 12/15/2007    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 Authorizing Provider  cetirizine (ZYRTEC) 10 MG tablet Take 10 mg by mouth daily.   Yes [provider]  cholecalciferol (VITAMIN D3) 25 MCG (1000 UT) tablet Take 1,000 Units by mouth daily.   Yes [provider]  diclofenac Sodium (VOLTAREN) 1 % GEL APPLY 4GM TO BOTH KNEES 4 TIMES DAILY ASNEEDED 10/03/21  Yes Viviana Simpler I, MD  famotidine (PEPCID) 20 MG tablet TAKE 1 TABLET BY MOUTH EVERY NIGHT AT BEDTIME 06/27/21  Yes [name],  MD  fexofenadine (ALLEGRA) 180 MG tablet Take 180 mg by mouth daily.   Yes [provider]  fluticasone (FLONASE) 50 MCG/ACT nasal spray Place 1 spray into both nostrils 2 (two) times daily.   Yes [provider]  gabapentin (NEURONTIN) 300 MG capsule TAKE 1 CAPSULE BY MOUTH TWICE DAILY 09/05/21  Yes [name], MD  glucosamine-chondroitin 500-400 MG tablet Take 1 tablet by mouth 3 (three) times daily.   Yes [provider]  ketoconazole (NIZORAL) 2 % cream APPLY A SMALL AMOUNT TO AFFECTED AREA TWICE DAILY 02/26/22  Yes Moye, [place], MD  levothyroxine (SYNTHROID) 50 MCG tablet TAKE 1 TABLET BY MOUTH ONCE A DAY BEFOREBREAKFAST. 04/04/22  Yes [name], MD  meloxicam (MOBIC) 15 MG tablet Take 1 tablet (15 mg total) by mouth daily. As needed 01/10/21  Yes Viviana Simpler I, MD  montelukast (SINGULAIR) 10 MG tablet TAKE 1 TABLET BY MOUTH EVERY NIGHT AT BEDTIME 02/03/22  Yes Dutch Quint B, FNP  Multiple Vitamin (MULTIVITAMIN WITH MINERALS) TABS tablet Take 1 tablet by mouth daily.   Yes [provider]  mupirocin ointment (BACTROBAN) 2 % Apply 1 application topically 3 (three) times daily. 12/06/20  Yes Moye, [place], MD  Probiotic Product (PROBIOTIC PO) Take 1 capsule by mouth 2 (two) times daily.   Yes [provider]  rosuvastatin (CRESTOR) 5 MG tablet TAKE 1 TABLET BY  MOUTH ONCE A DAY 04/04/22  Yes End, Harrell Gave, MD  triamcinolone ointment (KENALOG) 0.1 % APPLY TO HANDS AT BEDTIME AND COVER WITHGLOVES AS NEEDED FOR FLARES. AVOID FACE/GROIN/AXILLA. 08/19/21  Yes Moye, [place], MD    Allergies as of 02/04/2022 - Review Complete 01/15/2022  Allergen Reaction Noted   Compazine [prochlorperazine edisylate]  05/17/2018   Other  05/17/2018   Augmentin [amoxicillin-pot clavulanate] Diarrhea 01/03/2022    Family History  Problem Relation Age of Onset   Cancer Mother    Alzheimer's disease Mother    Stroke Maternal Grandmother    Hypertension  Father    Alzheimer's disease Father     Social History   Socioeconomic History   Marital status: Married    Spouse name: Not on file   Number of children: 3   Years of education: Not on file   Highest education level: Not on file  Occupational History   Occupation: Animal nutritionist    Comment: retired  Tobacco Use   Smoking status: Never   Smokeless tobacco: Never  Scientific laboratory technician Use: Never used  Substance and Sexual Activity   Alcohol use: Yes    Comment: Once glass of wine per month, none last 24hrs   Drug use: Never   Sexual activity: Yes  Other Topics Concern   Not on file  Social History Narrative   Married   Animal nutritionist   Wife is nurse    Has a daughter in [place]; son and daughter in [place]    6 grandchildren   [place] from [place]; spent some time in [place]; moved here 2019.    Lives at [facility]      Has advanced directives   Daughter [name] is health care POA   Would accept resuscitation attempts   No feeding tube if cognitively unaware   Social Determinants of Health   Financial Resource Strain: Low Risk  (01/03/2022)   Overall Financial Resource Strain (CARDIA)    Difficulty of Paying Living Expenses: Not hard at all  Food Insecurity: No Food Insecurity (01/03/2022)   Hunger Vital Sign    Worried About Running Out of Food in the Last Year: Never true    Ran Out of Food in the Last Year: Never true  Transportation Needs: No Transportation Needs (10/26/2018)   PRAPARE - Hydrologist (Medical): No    Lack of Transportation (Non-Medical): No  Physical Activity: Unknown (10/26/2018)   Exercise Vital Sign    Days of Exercise per Week: 0 days    Minutes of Exercise per Session: Not on file  Stress: No Stress Concern Present (10/26/2018)   [place]    Feeling of Stress : Not at all  Social Connections: Not on file  Intimate Partner Violence: Not At Risk  (10/26/2018)   Humiliation, Afraid, Rape, and Kick questionnaire    Fear of Current or Ex-Partner: No    Emotionally Abused: No    Physically Abused: No    Sexually Abused: No    Review of Systems: See HPI, otherwise negative ROS  Physical Exam: BP (!) 140/80   Pulse 71   Temp 98.1 F (36.7 C) (Temporal)   Resp 13   Wt 83.5 kg   SpO2 100%   BMI 24.28 kg/m  General:   Alert, cooperative in NAD Head:  Normocephalic and atraumatic. Respiratory:  Normal work of breathing. Cardiovascular:  RRR  Impression/Plan: Kevin Terrell is here for cataract  surgery.  Risks, benefits, limitations, and alternatives regarding cataract surgery have been reviewed with the patient.  Questions have been answered.  All parties agreeable.   Birder Robson, MD  04/15/2022, 7:51 AM

## 2022-04-16 ENCOUNTER — Encounter: Payer: Self-pay | Admitting: Ophthalmology

## 2022-04-17 ENCOUNTER — Telehealth: Payer: Self-pay | Admitting: Internal Medicine

## 2022-04-17 NOTE — Telephone Encounter (Signed)
Patient called in asking would you recommend him receiving the Abryso-rsz vaccine? He would also like to know when you are retiring ,and states that when you do retire he would like to request Dr.Duncan as his new provider.

## 2022-04-17 NOTE — Telephone Encounter (Signed)
Spoke to pt. Relayed Dr Alla German message. I did advise the pt that as of right now, Dr Josefine Class practice is not taking new patients. It may be something closer to Dr Alla German retirement to look in to.

## 2022-04-21 ENCOUNTER — Telehealth: Payer: Self-pay

## 2022-04-21 NOTE — Telephone Encounter (Signed)
Error

## 2022-04-22 ENCOUNTER — Encounter: Payer: Self-pay | Admitting: Family Medicine

## 2022-04-22 ENCOUNTER — Ambulatory Visit (INDEPENDENT_AMBULATORY_CARE_PROVIDER_SITE_OTHER): Payer: Medicare Other | Admitting: Family Medicine

## 2022-04-22 VITALS — BP 98/62 | HR 66 | Temp 98.6°F | Resp 16 | Ht 73.0 in | Wt 189.2 lb

## 2022-04-22 DIAGNOSIS — M545 Low back pain, unspecified: Secondary | ICD-10-CM | POA: Diagnosis not present

## 2022-04-22 DIAGNOSIS — I2510 Atherosclerotic heart disease of native coronary artery without angina pectoris: Secondary | ICD-10-CM

## 2022-04-22 DIAGNOSIS — M9902 Segmental and somatic dysfunction of thoracic region: Secondary | ICD-10-CM | POA: Diagnosis not present

## 2022-04-22 DIAGNOSIS — I2584 Coronary atherosclerosis due to calcified coronary lesion: Secondary | ICD-10-CM

## 2022-04-22 DIAGNOSIS — M9901 Segmental and somatic dysfunction of cervical region: Secondary | ICD-10-CM | POA: Diagnosis not present

## 2022-04-22 DIAGNOSIS — M62830 Muscle spasm of back: Secondary | ICD-10-CM | POA: Diagnosis not present

## 2022-04-22 DIAGNOSIS — M5033 Other cervical disc degeneration, cervicothoracic region: Secondary | ICD-10-CM | POA: Diagnosis not present

## 2022-04-22 NOTE — Patient Instructions (Signed)
Start heat, continue tylenol and start PT at [facility].  Follow up if pain not improving as expected in 3-4 weeks.

## 2022-04-22 NOTE — Progress Notes (Signed)
Patient ID: Kevin Terrell, male    DOB: 06-19-1941, 81 y.o.   MRN: [id number]  This visit was conducted in person.  BP 98/62   Pulse 66   Temp 98.6 F (37 C)   Resp 16   Ht 6\' 1"  (1.854 m)   Wt 189 lb 4 oz (85.8 kg)   SpO2 96%   BMI 24.97 kg/m    CC:  Chief Complaint  Patient presents with   Back Pain    Lower back 3-4 weeks    Subjective:   HPI: Kevin Terrell is a 82 y.o. male  pt of Dr. Alla German presenting on 04/22/2022 for Back Pain (Lower back 3-4 weeks)  New  flare of chronic low back pain X 3-4 weeks.  No change in activity, no fall.  Bilateral low back pain, worse with bending forward. No radiation of pain to legs.  No new numbness, no weakness.  Pan not keeping him up at night.  2-3/10 on pain scale 4-5/10   No incontinence, no fever, no perineal numbness.  He is using tylenol for pain TID. Helps.  He would like to return to PT.  Lives at [facility].   HX of lung cancer, OA  Hx of lumbar back pain year ago, no past back surgery.   On gabapentin for neuropathy post chemo.     Relevant past medical, surgical, family and social history reviewed and updated as indicated. Interim medical history since our last visit reviewed. Allergies and medications reviewed and updated. Outpatient Medications Prior to Visit  Medication Sig Dispense Refill   cetirizine (ZYRTEC) 10 MG tablet Take 10 mg by mouth daily.     cholecalciferol (VITAMIN D3) 25 MCG (1000 UT) tablet Take 1,000 Units by mouth daily.     diclofenac Sodium (VOLTAREN) 1 % GEL APPLY 4GM TO BOTH KNEES 4 TIMES DAILY ASNEEDED 200 g 11   famotidine (PEPCID) 20 MG tablet TAKE 1 TABLET BY MOUTH EVERY NIGHT AT BEDTIME 90 tablet 3   fexofenadine (ALLEGRA) 180 MG tablet Take 180 mg by mouth daily.     fluticasone (FLONASE) 50 MCG/ACT nasal spray Place 1 spray into both nostrils 2 (two) times daily.     gabapentin (NEURONTIN) 300 MG capsule TAKE 1 CAPSULE BY MOUTH TWICE DAILY 180 capsule 3    glucosamine-chondroitin 500-400 MG tablet Take 1 tablet by mouth 3 (three) times daily.     ketoconazole (NIZORAL) 2 % cream APPLY A SMALL AMOUNT TO AFFECTED AREA TWICE DAILY 60 g 3   levothyroxine (SYNTHROID) 50 MCG tablet TAKE 1 TABLET BY MOUTH ONCE A DAY BEFOREBREAKFAST. 90 tablet 3   meloxicam (MOBIC) 15 MG tablet Take 1 tablet (15 mg total) by mouth daily. As needed 90 tablet 3   montelukast (SINGULAIR) 10 MG tablet TAKE 1 TABLET BY MOUTH EVERY NIGHT AT BEDTIME 90 tablet 3   Multiple Vitamin (MULTIVITAMIN WITH MINERALS) TABS tablet Take 1 tablet by mouth daily.     mupirocin ointment (BACTROBAN) 2 % Apply 1 application topically 3 (three) times daily. 22 g 0   Probiotic Product (PROBIOTIC PO) Take 1 capsule by mouth 2 (two) times daily.     rosuvastatin (CRESTOR) 5 MG tablet TAKE 1 TABLET BY MOUTH ONCE A DAY 30 tablet 4   triamcinolone ointment (KENALOG) 0.1 % APPLY TO HANDS AT BEDTIME AND COVER WITHGLOVES AS NEEDED FOR FLARES. AVOID FACE/GROIN/AXILLA. 80 g 0   No facility-administered medications prior to visit.     Per HPI  unless specifically indicated in ROS section below Review of Systems  Constitutional:  Negative for fatigue and fever.  HENT:  Negative for ear pain.   Eyes:  Negative for pain.  Respiratory:  Negative for cough and shortness of breath.   Cardiovascular:  Negative for chest pain, palpitations and leg swelling.  Gastrointestinal:  Negative for abdominal pain.  Genitourinary:  Negative for dysuria.  Musculoskeletal:  Positive for back pain. Negative for arthralgias.  Neurological:  Negative for syncope, light-headedness and headaches.  Psychiatric/Behavioral:  Negative for dysphoric mood.    Objective:  BP 98/62   Pulse 66   Temp 98.6 F (37 C)   Resp 16   Ht 6\' 1"  (1.854 m)   Wt 189 lb 4 oz (85.8 kg)   SpO2 96%   BMI 24.97 kg/m   Wt Readings from Last 3 Encounters:  04/22/22 189 lb 4 oz (85.8 kg)  04/15/22 184 lb (83.5 kg)  04/01/22 189 lb (85.7 kg)       Physical Exam Constitutional:      Appearance: He is well-developed.  HENT:     Head: Normocephalic.     Right Ear: Hearing normal.     Left Ear: Hearing normal.     Nose: Nose normal.  Neck:     Thyroid: No thyroid mass or thyromegaly.     Vascular: No carotid bruit.     Trachea: Trachea normal.  Cardiovascular:     Rate and Rhythm: Normal rate and regular rhythm.     Pulses: Normal pulses.     Heart sounds: Heart sounds not distant. No murmur heard.    No friction rub. No gallop.     Comments: No peripheral edema Pulmonary:     Effort: Pulmonary effort is normal. No respiratory distress.     Breath sounds: Normal breath sounds.  Musculoskeletal:     Cervical back: Normal.     Thoracic back: Normal.     Lumbar back: Tenderness present. No bony tenderness. Decreased range of motion. Negative right straight leg raise test and negative left straight leg raise test.  Skin:    General: Skin is warm and dry.     Findings: No rash.  Psychiatric:        Speech: Speech normal.        Behavior: Behavior normal.        Thought Content: Thought content normal.       Results for orders placed or performed in visit on 02/21/22  TSH  Result Value Ref Range   TSH 1.74 0.40 - 4.50 mIU/L  T4, free  Result Value Ref Range   Free T4 1.0 0.8 - 1.8 ng/dL     COVID 19 screen:  No recent travel or known exposure to [place] The patient denies respiratory symptoms of COVID 19 at this time. The importance of social distancing was discussed today.   Assessment and Plan    Problem List Items Addressed This Visit     Acute bilateral low back pain without sciatica - Primary     Start heat, continue tylenol and start PT at [facility].  Follow up if pain not improving as expected in 3-4 weeks.        Eliezer Lofts, MD

## 2022-04-23 DIAGNOSIS — M170 Bilateral primary osteoarthritis of knee: Secondary | ICD-10-CM | POA: Diagnosis not present

## 2022-04-23 DIAGNOSIS — M19012 Primary osteoarthritis, left shoulder: Secondary | ICD-10-CM | POA: Diagnosis not present

## 2022-04-23 DIAGNOSIS — M24012 Loose body in left shoulder: Secondary | ICD-10-CM | POA: Diagnosis not present

## 2022-04-23 DIAGNOSIS — M67922 Unspecified disorder of synovium and tendon, left upper arm: Secondary | ICD-10-CM | POA: Diagnosis not present

## 2022-04-23 DIAGNOSIS — M7522 Bicipital tendinitis, left shoulder: Secondary | ICD-10-CM | POA: Diagnosis not present

## 2022-04-23 DIAGNOSIS — G8929 Other chronic pain: Secondary | ICD-10-CM | POA: Diagnosis not present

## 2022-04-25 ENCOUNTER — Other Ambulatory Visit: Payer: Self-pay

## 2022-04-25 MED ORDER — ROSUVASTATIN CALCIUM 5 MG PO TABS: 5.0000 mg | ORAL_TABLET | Freq: Every day | ORAL | 2 refills | Status: DC

## 2022-04-29 DIAGNOSIS — M9902 Segmental and somatic dysfunction of thoracic region: Secondary | ICD-10-CM | POA: Diagnosis not present

## 2022-04-29 DIAGNOSIS — M5033 Other cervical disc degeneration, cervicothoracic region: Secondary | ICD-10-CM | POA: Diagnosis not present

## 2022-04-29 DIAGNOSIS — M62830 Muscle spasm of back: Secondary | ICD-10-CM | POA: Diagnosis not present

## 2022-04-29 DIAGNOSIS — M9901 Segmental and somatic dysfunction of cervical region: Secondary | ICD-10-CM | POA: Diagnosis not present

## 2022-04-30 DIAGNOSIS — G8929 Other chronic pain: Secondary | ICD-10-CM | POA: Diagnosis not present

## 2022-04-30 DIAGNOSIS — M25562 Pain in left knee: Secondary | ICD-10-CM | POA: Diagnosis not present

## 2022-04-30 DIAGNOSIS — M25561 Pain in right knee: Secondary | ICD-10-CM | POA: Diagnosis not present

## 2022-04-30 DIAGNOSIS — M1712 Unilateral primary osteoarthritis, left knee: Secondary | ICD-10-CM | POA: Diagnosis not present

## 2022-04-30 DIAGNOSIS — M1711 Unilateral primary osteoarthritis, right knee: Secondary | ICD-10-CM | POA: Diagnosis not present

## 2022-05-02 ENCOUNTER — Other Ambulatory Visit: Payer: Medicare Other

## 2022-05-06 DIAGNOSIS — M545 Low back pain, unspecified: Secondary | ICD-10-CM | POA: Diagnosis not present

## 2022-05-07 DIAGNOSIS — M1711 Unilateral primary osteoarthritis, right knee: Secondary | ICD-10-CM | POA: Diagnosis not present

## 2022-05-07 DIAGNOSIS — M25561 Pain in right knee: Secondary | ICD-10-CM | POA: Diagnosis not present

## 2022-05-07 DIAGNOSIS — G8929 Other chronic pain: Secondary | ICD-10-CM | POA: Diagnosis not present

## 2022-05-07 DIAGNOSIS — M1712 Unilateral primary osteoarthritis, left knee: Secondary | ICD-10-CM | POA: Diagnosis not present

## 2022-05-07 DIAGNOSIS — M25562 Pain in left knee: Secondary | ICD-10-CM | POA: Diagnosis not present

## 2022-05-09 ENCOUNTER — Ambulatory Visit: Payer: Medicare Other

## 2022-05-09 DIAGNOSIS — M545 Low back pain, unspecified: Secondary | ICD-10-CM | POA: Diagnosis not present

## 2022-05-09 DIAGNOSIS — B351 Tinea unguium: Secondary | ICD-10-CM

## 2022-05-09 DIAGNOSIS — M79676 Pain in unspecified toe(s): Secondary | ICD-10-CM

## 2022-05-09 NOTE — Patient Instructions (Signed)

## 2022-05-09 NOTE — Progress Notes (Signed)
Patient presents today for the 11th (additional treatments) laser treatment. 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   All the nails are looking a lot better. He is pleased with the progress.  All other systems are negative.  Nails were filed thin. Laser therapy was administered to 1-5 toenails bilateral and patient tolerated the treatment well. All safety precautions were in place.   Since patient has had multiple rounds of laser, I recommended laser maintenance every 3 months for preventative. He will follow up in 3 months.

## 2022-05-11 NOTE — Assessment & Plan Note (Signed)
Start heat, continue tylenol and start PT at [facility].  Follow up if pain not improving as expected in 3-4 weeks.

## 2022-05-13 DIAGNOSIS — M545 Low back pain, unspecified: Secondary | ICD-10-CM | POA: Diagnosis not present

## 2022-05-16 DIAGNOSIS — M545 Low back pain, unspecified: Secondary | ICD-10-CM | POA: Diagnosis not present

## 2022-05-19 DIAGNOSIS — Z23 Encounter for immunization: Secondary | ICD-10-CM | POA: Diagnosis not present

## 2022-05-20 DIAGNOSIS — M545 Low back pain, unspecified: Secondary | ICD-10-CM | POA: Diagnosis not present

## 2022-05-23 ENCOUNTER — Encounter: Payer: Self-pay | Admitting: Internal Medicine

## 2022-05-23 DIAGNOSIS — M545 Low back pain, unspecified: Secondary | ICD-10-CM | POA: Diagnosis not present

## 2022-05-23 NOTE — Telephone Encounter (Signed)
Patient called in and stated he sent this message in regards to receiving his flu shot.

## 2022-05-27 DIAGNOSIS — M545 Low back pain, unspecified: Secondary | ICD-10-CM | POA: Diagnosis not present

## 2022-05-29 DIAGNOSIS — M545 Low back pain, unspecified: Secondary | ICD-10-CM | POA: Diagnosis not present

## 2022-06-03 DIAGNOSIS — M545 Low back pain, unspecified: Secondary | ICD-10-CM | POA: Diagnosis not present

## 2022-06-04 ENCOUNTER — Ambulatory Visit: Payer: Medicare Other

## 2022-06-05 DIAGNOSIS — M545 Low back pain, unspecified: Secondary | ICD-10-CM | POA: Diagnosis not present

## 2022-06-09 DIAGNOSIS — Z23 Encounter for immunization: Secondary | ICD-10-CM | POA: Diagnosis not present

## 2022-06-18 DIAGNOSIS — M545 Low back pain, unspecified: Secondary | ICD-10-CM | POA: Diagnosis not present

## 2022-06-19 DIAGNOSIS — M7522 Bicipital tendinitis, left shoulder: Secondary | ICD-10-CM | POA: Diagnosis not present

## 2022-06-19 DIAGNOSIS — M24012 Loose body in left shoulder: Secondary | ICD-10-CM | POA: Diagnosis not present

## 2022-06-19 DIAGNOSIS — M19012 Primary osteoarthritis, left shoulder: Secondary | ICD-10-CM | POA: Diagnosis not present

## 2022-06-19 DIAGNOSIS — M67922 Unspecified disorder of synovium and tendon, left upper arm: Secondary | ICD-10-CM | POA: Diagnosis not present

## 2022-06-19 DIAGNOSIS — G8929 Other chronic pain: Secondary | ICD-10-CM | POA: Diagnosis not present

## 2022-06-23 DIAGNOSIS — M545 Low back pain, unspecified: Secondary | ICD-10-CM | POA: Diagnosis not present

## 2022-06-25 DIAGNOSIS — M545 Low back pain, unspecified: Secondary | ICD-10-CM | POA: Diagnosis not present

## 2022-06-26 DIAGNOSIS — M7522 Bicipital tendinitis, left shoulder: Secondary | ICD-10-CM | POA: Diagnosis not present

## 2022-06-26 DIAGNOSIS — M19012 Primary osteoarthritis, left shoulder: Secondary | ICD-10-CM | POA: Diagnosis not present

## 2022-06-26 DIAGNOSIS — M24012 Loose body in left shoulder: Secondary | ICD-10-CM | POA: Diagnosis not present

## 2022-06-26 DIAGNOSIS — G8929 Other chronic pain: Secondary | ICD-10-CM | POA: Diagnosis not present

## 2022-06-26 DIAGNOSIS — M67922 Unspecified disorder of synovium and tendon, left upper arm: Secondary | ICD-10-CM | POA: Diagnosis not present

## 2022-07-03 DIAGNOSIS — G8929 Other chronic pain: Secondary | ICD-10-CM | POA: Diagnosis not present

## 2022-07-03 DIAGNOSIS — M24012 Loose body in left shoulder: Secondary | ICD-10-CM | POA: Diagnosis not present

## 2022-07-03 DIAGNOSIS — M19012 Primary osteoarthritis, left shoulder: Secondary | ICD-10-CM | POA: Diagnosis not present

## 2022-07-07 ENCOUNTER — Ambulatory Visit: Payer: Medicare Other

## 2022-07-09 ENCOUNTER — Telehealth: Payer: Self-pay

## 2022-07-09 NOTE — Progress Notes (Signed)
Chronic Care Management Pharmacy Assistant   Name: Kevin Terrell  MRN: [id number] DOB: Sep 05, 1940  Reason for Encounter: CCM (General Adherence)  Recent office visits:  05/19/22 Flu Vaccine 01/15/22 Kevin Terrell AWV Change: meloxicam 15 mg to every other day along with tylenol 1000 bid   Recent consult visits:  05/07/22 Kevin Hammers, DO Chronic left shoulder pain  Procedure: Ultrasound guided LEFT Knee Joint Injection with 3rd Orthovisc  04/23/22 Kevin Hammers, DO Chronic left shoulder pain  Procedure: LEFT Ultrasound guided Proximal Biceps Tendon Sheath Injection  04/22/22 Kevin Lofts, MD Back Pain Start: PT at [facility]rell 04/15/22 Kevin Terrell Extraction 04/01/22 Kevin Terrell Cataract Extraction 03/24/22 CT Chest 03/21/22 Kevin Hammers, DO Chronic left shoulder pain Procedure: LEFT Ultrasound guided Proximal Biceps Tendon Sheath Injection  03/20/22 Kevin Nordmann, NP (Cardiology) Coronary artery calcification Ordered: EKG FU 1 year 02/27/22 Kevin Patten, MD (Dermatology) AK Procedure: Destruction of lesion  Terrell visits:  None in previous 6 months  Medications: Outpatient Encounter Medications as of 07/09/2022  Medication Sig   cetirizine (ZYRTEC) 10 MG tablet Take 10 mg by mouth daily.   cholecalciferol (VITAMIN D3) 25 MCG (1000 UT) tablet Take 1,000 Units by mouth daily.   diclofenac Sodium (VOLTAREN) 1 % GEL APPLY 4GM TO BOTH KNEES 4 TIMES DAILY ASNEEDED   famotidine (PEPCID) 20 MG tablet TAKE 1 TABLET BY MOUTH EVERY NIGHT AT BEDTIME   fexofenadine (ALLEGRA) 180 MG tablet Take 180 mg by mouth daily.   fluticasone (FLONASE) 50 MCG/ACT nasal spray Place 1 spray into both nostrils 2 (two) times daily.   gabapentin (NEURONTIN) 300 MG capsule TAKE 1 CAPSULE BY MOUTH TWICE DAILY   glucosamine-chondroitin 500-400 MG tablet Take 1 tablet by mouth 3 (three) times daily.   ketoconazole (NIZORAL) 2 % cream APPLY A SMALL AMOUNT TO AFFECTED AREA  TWICE DAILY   levothyroxine (SYNTHROID) 50 MCG tablet TAKE 1 TABLET BY MOUTH ONCE A DAY BEFOREBREAKFAST.   meloxicam (MOBIC) 15 MG tablet Take 1 tablet (15 mg total) by mouth daily. As needed   montelukast (SINGULAIR) 10 MG tablet TAKE 1 TABLET BY MOUTH EVERY NIGHT AT BEDTIME   Multiple Vitamin (MULTIVITAMIN WITH MINERALS) TABS tablet Take 1 tablet by mouth daily.   mupirocin ointment (BACTROBAN) 2 % Apply 1 application topically 3 (three) times daily.   Probiotic Product (PROBIOTIC PO) Take 1 capsule by mouth 2 (two) times daily.   rosuvastatin (CRESTOR) 5 MG tablet Take 1 tablet (5 mg total) by mouth daily.   triamcinolone ointment (KENALOG) 0.1 % APPLY TO HANDS AT BEDTIME AND COVER WITHGLOVES AS NEEDED FOR FLARES. AVOID FACE/GROIN/AXILLA.   No facility-administered encounter medications on file as of 07/09/2022.    Contacted Kevin Terrell on 07/09/2022 for general disease state and medication adherence call.   Patient is not more than 5 days past due for refill on the following medications per chart history:  Star Medications: Medication Name/mg Last Fill Days Supply Rosuvastatin 5 mg  06/17/2022 30  What concerns do you have about your medications?  The patient denies side effects with their medications.   How often do you forget or accidentally miss a dose? Never  Do you use a pillbox? Patient uses bubble packs  Are you having any problems getting your medications from your pharmacy? No  Has the cost of your medications been a concern? No  Since last visit with CPP, no interventions have been made.   The patient has not had an ED visit  since last contact.   The patient denies problems with their health. Patient stated he is doing great  Patient denies concerns or questions for Charlene Brooke, PharmD at this time.   Care Gaps: Annual wellness visit in last year? Yes 01/15/2022 Most Recent BP reading: 98/62 on 04/22/2022  Summary of recommendations from last [facility] visit (Date:12/31/2021)  Summary: CCM Initial Visit -Pt endorses compliance with medications as prescribed -Pt is taking meloxicam 15 mg daily for joint pain; discussed risks of long term NSAID use including kidney impairment and CV risks   Recommendations/Changes made from today's visit: -Trial Meloxicam 15 mg QOD - use Tylenol 1000 mg BID to compensate; optimally stop meloxicam altogether and only use PRN   Plan: -Viola will call patient 6 months for general update -Pharmacist follow up televisit scheduled for 1 year -CPE 01/15/22   Upcoming appointments: CCM appointment on 12/31/2022  Charlene Brooke, CPP notified  [name], [place] Assistant [phone]

## 2022-07-10 ENCOUNTER — Other Ambulatory Visit: Payer: Self-pay | Admitting: Internal Medicine

## 2022-07-14 ENCOUNTER — Telehealth: Payer: Self-pay

## 2022-07-14 NOTE — Telephone Encounter (Signed)
Patient called to reschedule his PDT appt from Nov 16 because he going to a football game on Nov 18, okay rescheduled PDT for Nov 30

## 2022-07-15 ENCOUNTER — Ambulatory Visit: Payer: Medicare Other

## 2022-07-17 ENCOUNTER — Ambulatory Visit: Payer: Medicare Other

## 2022-07-29 ENCOUNTER — Telehealth: Payer: Self-pay | Admitting: Internal Medicine

## 2022-07-29 NOTE — Telephone Encounter (Signed)
Pt called stating he was told due to his age, unless there's an issue, there would be no more colonoscopy's given to him. However recently, he received a letter stating he was time to get a cologuard completed. Pt is asking could he pick up a sample here in office? Pt also stated about 6-9 months ago, he asked Silvio Pate should he get the RSV vaccine & Letvak told him to wait. Pt is asking if it fine for him to get the vaccine now or should he continue to wait? Pt also stated he seen [facility]vak before for pain in his left shoulder & was referred to [place]. Pt states his shoulder is in pain again & is asking can orders or another referral be sent to [facility] for him? Call back # [phone]

## 2022-07-30 DIAGNOSIS — G8929 Other chronic pain: Secondary | ICD-10-CM | POA: Diagnosis not present

## 2022-07-30 DIAGNOSIS — M25512 Pain in left shoulder: Secondary | ICD-10-CM | POA: Diagnosis not present

## 2022-07-30 DIAGNOSIS — M67922 Unspecified disorder of synovium and tendon, left upper arm: Secondary | ICD-10-CM | POA: Diagnosis not present

## 2022-07-30 DIAGNOSIS — M19012 Primary osteoarthritis, left shoulder: Secondary | ICD-10-CM | POA: Diagnosis not present

## 2022-07-30 DIAGNOSIS — M24012 Loose body in left shoulder: Secondary | ICD-10-CM | POA: Diagnosis not present

## 2022-07-30 DIAGNOSIS — M7522 Bicipital tendinitis, left shoulder: Secondary | ICD-10-CM | POA: Diagnosis not present

## 2022-07-30 NOTE — Telephone Encounter (Signed)
Spoke to pt. He appreciated all of the feedback. He is going to ortho today.

## 2022-07-31 ENCOUNTER — Ambulatory Visit (INDEPENDENT_AMBULATORY_CARE_PROVIDER_SITE_OTHER): Payer: Medicare Other | Admitting: Dermatology

## 2022-07-31 ENCOUNTER — Encounter: Payer: Self-pay | Admitting: Dermatology

## 2022-07-31 DIAGNOSIS — L570 Actinic keratosis: Secondary | ICD-10-CM | POA: Diagnosis not present

## 2022-07-31 MED ORDER — AMINOLEVULINIC ACID HCL 10 % EX GEL
2000.0000 mg | Freq: Once | CUTANEOUS | Status: AC
  Administered 2022-07-31: 2000 mg via TOPICAL

## 2022-07-31 NOTE — Progress Notes (Signed)
Patient completed red light therapy today.  1. AK (actinic keratosis) (2) Head - Anterior (Face), Left Forearm - Posterior Patient here toady for Red Light therapy.   Photodynamic therapy - Head - Anterior (Face), Left Forearm - Posterior Procedure discussed: discussed risks, benefits, side effects. and alternatives   Prep: site scrubbed/prepped with acetone   Debridement needed: Yes   Location:  Face and one spot L forearm Number of lesions:  Multiple Type of treatment:  Red light Aminolevulinic Acid (see MAR for details): Ameluz Number of minutes under lamp:  30 Number of seconds under lamp:  0 Cooling:  Floor fan Post-procedure details: sunscreen applied   Additional details:  Debridement done on 1 AK, left forearm, and covered with medical grade tape. Incubated for 90 minute. Also treated for 10 minutes with red light therapy and Ameluz.   Johnsie Kindred, RMA  Documentation: I performed debridement, have reviewed the above documentation for accuracy and completeness, and I agree with the above.  Forest Gleason, MD

## 2022-08-01 ENCOUNTER — Other Ambulatory Visit: Payer: Medicare Other

## 2022-08-05 DIAGNOSIS — M67922 Unspecified disorder of synovium and tendon, left upper arm: Secondary | ICD-10-CM | POA: Diagnosis not present

## 2022-08-05 DIAGNOSIS — M19012 Primary osteoarthritis, left shoulder: Secondary | ICD-10-CM | POA: Diagnosis not present

## 2022-08-05 DIAGNOSIS — M25512 Pain in left shoulder: Secondary | ICD-10-CM | POA: Diagnosis not present

## 2022-08-05 DIAGNOSIS — M24012 Loose body in left shoulder: Secondary | ICD-10-CM | POA: Diagnosis not present

## 2022-08-05 DIAGNOSIS — M7522 Bicipital tendinitis, left shoulder: Secondary | ICD-10-CM | POA: Diagnosis not present

## 2022-08-07 DIAGNOSIS — M19012 Primary osteoarthritis, left shoulder: Secondary | ICD-10-CM | POA: Diagnosis not present

## 2022-08-07 DIAGNOSIS — M24012 Loose body in left shoulder: Secondary | ICD-10-CM | POA: Diagnosis not present

## 2022-08-07 DIAGNOSIS — M67922 Unspecified disorder of synovium and tendon, left upper arm: Secondary | ICD-10-CM | POA: Diagnosis not present

## 2022-08-07 DIAGNOSIS — M25512 Pain in left shoulder: Secondary | ICD-10-CM | POA: Diagnosis not present

## 2022-08-07 DIAGNOSIS — M7522 Bicipital tendinitis, left shoulder: Secondary | ICD-10-CM | POA: Diagnosis not present

## 2022-08-08 ENCOUNTER — Other Ambulatory Visit: Payer: Medicare Other

## 2022-08-12 DIAGNOSIS — M19012 Primary osteoarthritis, left shoulder: Secondary | ICD-10-CM | POA: Diagnosis not present

## 2022-08-12 DIAGNOSIS — M7522 Bicipital tendinitis, left shoulder: Secondary | ICD-10-CM | POA: Diagnosis not present

## 2022-08-12 DIAGNOSIS — M67922 Unspecified disorder of synovium and tendon, left upper arm: Secondary | ICD-10-CM | POA: Diagnosis not present

## 2022-08-12 DIAGNOSIS — M25512 Pain in left shoulder: Secondary | ICD-10-CM | POA: Diagnosis not present

## 2022-08-12 DIAGNOSIS — M24012 Loose body in left shoulder: Secondary | ICD-10-CM | POA: Diagnosis not present

## 2022-08-14 ENCOUNTER — Ambulatory Visit: Payer: Medicare Other

## 2022-08-15 ENCOUNTER — Telehealth: Payer: Self-pay | Admitting: Internal Medicine

## 2022-08-15 DIAGNOSIS — M67922 Unspecified disorder of synovium and tendon, left upper arm: Secondary | ICD-10-CM | POA: Diagnosis not present

## 2022-08-15 DIAGNOSIS — M7522 Bicipital tendinitis, left shoulder: Secondary | ICD-10-CM | POA: Diagnosis not present

## 2022-08-15 DIAGNOSIS — M19012 Primary osteoarthritis, left shoulder: Secondary | ICD-10-CM | POA: Diagnosis not present

## 2022-08-15 DIAGNOSIS — M25512 Pain in left shoulder: Secondary | ICD-10-CM | POA: Diagnosis not present

## 2022-08-15 DIAGNOSIS — M24012 Loose body in left shoulder: Secondary | ICD-10-CM | POA: Diagnosis not present

## 2022-08-15 NOTE — Telephone Encounter (Signed)
Called patient and reviewed all information. Patient verbalized understanding he will wait 48 hours fever free to be exposed to his grandson.

## 2022-08-15 NOTE — Telephone Encounter (Signed)
Patient called in and stated that his grandson has the daycare flu. He was wanting to know if 24 hours is enough time to wait or if he should wait 48 hours after the symptoms and fever are gone. Please advise. Thank you!

## 2022-08-19 DIAGNOSIS — M19012 Primary osteoarthritis, left shoulder: Secondary | ICD-10-CM | POA: Diagnosis not present

## 2022-08-19 DIAGNOSIS — M67922 Unspecified disorder of synovium and tendon, left upper arm: Secondary | ICD-10-CM | POA: Diagnosis not present

## 2022-08-19 DIAGNOSIS — M24012 Loose body in left shoulder: Secondary | ICD-10-CM | POA: Diagnosis not present

## 2022-08-19 DIAGNOSIS — M7522 Bicipital tendinitis, left shoulder: Secondary | ICD-10-CM | POA: Diagnosis not present

## 2022-08-19 DIAGNOSIS — M25512 Pain in left shoulder: Secondary | ICD-10-CM | POA: Diagnosis not present

## 2022-08-21 DIAGNOSIS — M19012 Primary osteoarthritis, left shoulder: Secondary | ICD-10-CM | POA: Diagnosis not present

## 2022-08-21 DIAGNOSIS — M24012 Loose body in left shoulder: Secondary | ICD-10-CM | POA: Diagnosis not present

## 2022-08-21 DIAGNOSIS — M67922 Unspecified disorder of synovium and tendon, left upper arm: Secondary | ICD-10-CM | POA: Diagnosis not present

## 2022-08-21 DIAGNOSIS — M25512 Pain in left shoulder: Secondary | ICD-10-CM | POA: Diagnosis not present

## 2022-08-21 DIAGNOSIS — M7522 Bicipital tendinitis, left shoulder: Secondary | ICD-10-CM | POA: Diagnosis not present

## 2022-09-02 DIAGNOSIS — M25512 Pain in left shoulder: Secondary | ICD-10-CM | POA: Diagnosis not present

## 2022-09-02 DIAGNOSIS — M7522 Bicipital tendinitis, left shoulder: Secondary | ICD-10-CM | POA: Diagnosis not present

## 2022-09-02 DIAGNOSIS — M67922 Unspecified disorder of synovium and tendon, left upper arm: Secondary | ICD-10-CM | POA: Diagnosis not present

## 2022-09-02 DIAGNOSIS — M24012 Loose body in left shoulder: Secondary | ICD-10-CM | POA: Diagnosis not present

## 2022-09-02 DIAGNOSIS — M19012 Primary osteoarthritis, left shoulder: Secondary | ICD-10-CM | POA: Diagnosis not present

## 2022-09-04 ENCOUNTER — Encounter: Payer: Self-pay | Admitting: Internal Medicine

## 2022-09-04 ENCOUNTER — Ambulatory Visit: Payer: Medicare Other | Attending: Internal Medicine | Admitting: Internal Medicine

## 2022-09-04 VITALS — BP 120/80 | HR 78 | Ht 73.0 in | Wt 189.0 lb

## 2022-09-04 DIAGNOSIS — M7522 Bicipital tendinitis, left shoulder: Secondary | ICD-10-CM | POA: Diagnosis not present

## 2022-09-04 DIAGNOSIS — E7800 Pure hypercholesterolemia, unspecified: Secondary | ICD-10-CM | POA: Diagnosis not present

## 2022-09-04 DIAGNOSIS — I2510 Atherosclerotic heart disease of native coronary artery without angina pectoris: Secondary | ICD-10-CM

## 2022-09-04 DIAGNOSIS — I700 Atherosclerosis of aorta: Secondary | ICD-10-CM | POA: Diagnosis not present

## 2022-09-04 DIAGNOSIS — M24012 Loose body in left shoulder: Secondary | ICD-10-CM | POA: Diagnosis not present

## 2022-09-04 DIAGNOSIS — M25512 Pain in left shoulder: Secondary | ICD-10-CM | POA: Diagnosis not present

## 2022-09-04 DIAGNOSIS — M67922 Unspecified disorder of synovium and tendon, left upper arm: Secondary | ICD-10-CM | POA: Diagnosis not present

## 2022-09-04 DIAGNOSIS — M19012 Primary osteoarthritis, left shoulder: Secondary | ICD-10-CM | POA: Diagnosis not present

## 2022-09-04 NOTE — Patient Instructions (Addendum)
Medication Instructions:  Your Physician recommend you continue on your current medication as directed.    *If you need a refill on your cardiac medications before your next appointment, please call your pharmacy*   Lab Work: None ordered today   Testing/Procedures: CT Coronary Calcium Score:  Your physician has recommended that you have CT Coronary Calcium Score.  - $99 out of pocket cost at the time of your test - Call [phone] to schedule at your convenience.  Location: [place],  [address]      Follow-Up: At [facility], you and your health needs are our priority.  As part of our continuing mission to provide you with exceptional heart care, we have created designated Provider Care Teams.  These Care Teams include your primary Cardiologist (physician) and Advanced Practice Providers (APPs -  Physician Assistants and Nurse Practitioners) who all work together to provide you with the care you need, when you need it.  We recommend signing up for the patient portal called "MyChart".  Sign up information is provided on this After Visit Summary.  MyChart is used to connect with patients for Virtual Visits (Telemedicine).  Patients are able to view lab/test results, encounter notes, upcoming appointments, etc.  Non-urgent messages can be sent to your provider as well.   To learn more about what you can do with MyChart, go to [email or web address].    Your next appointment:   6 month(s)  The format for your next appointment:   In Person  Provider:   You may see Nelva Bush, MD or one of the following Advanced Practice Providers on your designated Care Team:   Murray Hodgkins, NP Christell Faith, PA-C Cadence Kathlen Mody, PA-C Gerrie Nordmann, NP

## 2022-09-04 NOTE — Progress Notes (Signed)
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  He was last seen in our office in July by Kevin Nordmann, NP, at which time he was feeling well.  He was continued on low-dose rosuvastatin, with most recent lipid panel demonstrating LDL of 61.  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  He is trying to exercise more regularly and walks up to 3 miles at 1 time several days a week.  He denies chest pain and shortness of breath when walking or at other times.  He also denies palpitations, lightheadedness, and edema.  He inquires about risks to his heart with sex as well as safety of using medications for erectile dysfunction if needed in the future.  --------------------------------------------------------------------------------------------------  Cardiovascular History & Procedures: Cardiovascular Problems: Hyperlipidemia Coronary artery calcification and aortic atherosclerosis on chest CT   Risk Factors: Hyperlipidemia, male gender, and age > 5   Cath/PCI: None   CV Surgery: None   EP Procedures and Devices: None   Non-Invasive Evaluation(s): Carotid Dopplers (12/06/2019): Minimal atherosclerotic plaquing without stenosis involving the right carotid artery.  Normal left carotid artery.  Recent CV Pertinent Labs: Lab Results  Component Value Date   CHOL 123 01/15/2022   CHOL 149 07/17/2021   HDL 46.60 01/15/2022   HDL 53 07/17/2021   LDLCALC 61 01/15/2022   LDLCALC 83 07/17/2021   TRIG  78.0 01/15/2022   CHOLHDL 3 01/15/2022   INR 1.4 (H) 11/04/2020   K 4.5 01/15/2022   MG 2.3 11/06/2020   BUN 15 01/15/2022   CREATININE 0.87 01/15/2022    Past medical and surgical history were reviewed and updated in EPIC.  Current Meds  Medication Sig   cetirizine (ZYRTEC) 10 MG tablet Take 10 mg by mouth daily.   cholecalciferol (VITAMIN D3) 25 MCG (1000 UT) tablet Take 1,000 Units by mouth daily.   diclofenac Sodium (VOLTAREN) 1 % GEL APPLY 4GM TO BOTH KNEES 4 TIMES DAILY ASNEEDED   famotidine (PEPCID) 20 MG tablet TAKE 1 TABLET BY MOUTH EVERY NIGHT AT BEDTIME   fexofenadine (ALLEGRA) 180 MG tablet Take 180 mg by mouth daily.   fluticasone (FLONASE) 50 MCG/ACT nasal spray Place 1 spray into both nostrils 2 (two) times daily.   gabapentin (NEURONTIN) 300 MG capsule TAKE 1 CAPSULE BY MOUTH TWICE DAILY   glucosamine-chondroitin 500-400 MG tablet Take 1 tablet by mouth 3 (three) times daily.   ketoconazole (NIZORAL) 2 % cream APPLY A SMALL AMOUNT TO AFFECTED AREA TWICE DAILY   levothyroxine (SYNTHROID) 50 MCG tablet TAKE 1 TABLET BY MOUTH ONCE A DAY BEFOREBREAKFAST.   meloxicam (MOBIC) 15 MG tablet Take 1 tablet (15 mg total) by mouth daily. As needed   montelukast (SINGULAIR) 10 MG tablet TAKE 1 TABLET BY MOUTH EVERY NIGHT AT BEDTIME   Multiple Vitamin (MULTIVITAMIN WITH MINERALS) TABS tablet Take 1 tablet by mouth daily.   mupirocin ointment (BACTROBAN) 2 % Apply 1 application topically 3 (three) times daily.   Probiotic Product (PROBIOTIC PO) Take 1 capsule by mouth 2 (two) times daily.   rosuvastatin (CRESTOR) 5 MG tablet Take 1  tablet (5 mg total) by mouth daily.   triamcinolone ointment (KENALOG) 0.1 % APPLY TO HANDS AT BEDTIME AND COVER WITHGLOVES AS NEEDED FOR FLARES. AVOID FACE/GROIN/AXILLA.    Allergies: Compazine [prochlorperazine edisylate], [place], Other, Phenylbutazone, and Augmentin [amoxicillin-pot clavulanate]  Social History   Tobacco Use   Smoking status: Never    Smokeless tobacco: Never  Vaping Use   Vaping Use: Never used  Substance Use Topics   Alcohol use: Yes    Comment: Once glass of wine per month, none last 24hrs   Drug use: Never    Family History  Problem Relation Age of Onset   Cancer Mother    Alzheimer's disease Mother    Stroke Maternal Grandmother    Hypertension Father    Alzheimer's disease Father     Review of Systems: A 12-system review of systems was performed and was negative except as noted in the HPI.  --------------------------------------------------------------------------------------------------  Physical Exam: BP 120/80 (BP Location: Left Arm, Patient Position: Sitting, Cuff Size: Normal)   Pulse 78   Ht 6\' 1"  (1.854 m)   Wt 189 lb (85.7 kg)   SpO2 98%   BMI 24.94 kg/m   General:  NAD. Neck: No JVD or HJR. Lungs: Clear to auscultation bilaterally without wheezes or crackles. Heart: Regular rate and rhythm with occasional extrasystoles.  Murmurs, rubs, or gallops. Abdomen: Soft, nontender, nondistended. Extremities: Trace pretibial edema.  EKG: Normal sinus rhythm with PACs versus sinus arrhythmia.  Otherwise, no significant abnormality.  Lab Results  Component Value Date   WBC 4.8 01/15/2022   HGB 13.1 01/15/2022   HCT 39.8 01/15/2022   MCV 90.7 01/15/2022   PLT 174.0 01/15/2022    Lab Results  Component Value Date   NA 139 01/15/2022   K 4.5 01/15/2022   CL 103 01/15/2022   CO2 31 01/15/2022   BUN 15 01/15/2022   CREATININE 0.87 01/15/2022   GLUCOSE 98 01/15/2022   ALT 16 01/15/2022    Lab Results  Component Value Date   CHOL 123 01/15/2022   HDL 46.60 01/15/2022   LDLCALC 61 01/15/2022   TRIG 78.0 01/15/2022   CHOLHDL 3 01/15/2022    --------------------------------------------------------------------------------------------------  ASSESSMENT AND PLAN: 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  He remains concerned about his risk of ASCVD.  We attempted to calculate his risk using the ACC/AHA ASCVD risk calculator, though it is not validated for patient's over 93 years old or with LDL below 70.  His most recent CT of the chest in 03/2022 shows grossly stable coronary artery calcification.  As he is asymptomatic, we have agreed to defer ischemia testing but will instead obtain a coronary calcium score to better quantify his degree of calcification.  If he has a high CAC score, ischemia testing will need to be considered down the road.  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  I think it is reasonable for him to continue having sex and to use PDE5 inhibitors (if necessary in the future).  Follow-up: Return to clinic in 6 months.  Nelva Bush, MD 09/04/2022 10:13 AM

## 2022-09-05 ENCOUNTER — Ambulatory Visit
Admission: RE | Admit: 2022-09-05 | Discharge: 2022-09-05 | Disposition: A | Discharge status: Home / Self Care | Payer: Medicare Other | Source: Ambulatory Visit | Attending: Internal Medicine | Admitting: Internal Medicine

## 2022-09-05 DIAGNOSIS — I2510 Atherosclerotic heart disease of native coronary artery without angina pectoris: Secondary | ICD-10-CM

## 2022-09-05 DIAGNOSIS — G8929 Other chronic pain: Secondary | ICD-10-CM | POA: Diagnosis not present

## 2022-09-05 DIAGNOSIS — M67922 Unspecified disorder of synovium and tendon, left upper arm: Secondary | ICD-10-CM | POA: Diagnosis not present

## 2022-09-05 DIAGNOSIS — M7522 Bicipital tendinitis, left shoulder: Secondary | ICD-10-CM | POA: Diagnosis not present

## 2022-09-05 DIAGNOSIS — M19012 Primary osteoarthritis, left shoulder: Secondary | ICD-10-CM | POA: Diagnosis not present

## 2022-09-05 DIAGNOSIS — M24012 Loose body in left shoulder: Secondary | ICD-10-CM | POA: Diagnosis not present

## 2022-09-08 ENCOUNTER — Encounter: Payer: Self-pay | Admitting: Dermatology

## 2022-09-08 ENCOUNTER — Telehealth: Payer: Self-pay | Admitting: Internal Medicine

## 2022-09-08 DIAGNOSIS — I2510 Atherosclerotic heart disease of native coronary artery without angina pectoris: Secondary | ICD-10-CM

## 2022-09-08 MED ORDER — ASPIRIN 81 MG PO TBEC: 81.0000 mg | DELAYED_RELEASE_TABLET | Freq: Every day | ORAL | Status: AC

## 2022-09-08 NOTE — Telephone Encounter (Signed)
Coronary calcium score CT results reviewed with Dr. Duffy Bruce.  CAC was high (1034; 71st percentile).  Though he is not symptomatic, we discussed the role for noninvasive ischemia testing with very high calcium scores and have agreed to obtain an exercise myocardial perfusion stress test at his convenience.  I also instructed him to being taking aspirin 81 mg daily.  We will continue rosuvastatin 5 mg daily, as LDL was well-controlled on last check in 12/2021.  Shared Decision Making/Informed Consent The risks [chest pain, shortness of breath, cardiac arrhythmias, dizziness, blood pressure fluctuations, myocardial infarction, stroke/transient ischemic attack, nausea, vomiting, allergic reaction, radiation exposure, metallic taste sensation and life-threatening complications (estimated to be 1 in 10,000)], benefits (risk stratification, diagnosing coronary artery disease, treatment guidance) and alternatives of a nuclear stress test were discussed in detail with Dr. Duffy Bruce and he agrees to proceed.  Nelva Bush, MD [facility]

## 2022-09-09 ENCOUNTER — Other Ambulatory Visit: Payer: Self-pay

## 2022-09-09 ENCOUNTER — Encounter: Payer: Self-pay | Admitting: Internal Medicine

## 2022-09-09 DIAGNOSIS — R21 Rash and other nonspecific skin eruption: Secondary | ICD-10-CM

## 2022-09-09 MED ORDER — TRIAMCINOLONE ACETONIDE 0.1 % EX OINT: TOPICAL_OINTMENT | CUTANEOUS | 1 refills | Status: DC

## 2022-09-09 MED ORDER — SILDENAFIL CITRATE 50 MG PO TABS: 50.0000 mg | ORAL_TABLET | Freq: Every day | ORAL | 11 refills | Status: DC | PRN

## 2022-09-09 NOTE — Telephone Encounter (Signed)
Pt made aware of the below instructions and verbalized understanding.   Your provider has ordered a Lexiscan/ Exercise Myoview Stress test. This will take place at [facility]. Please report to the [facility] medical mall entrance. The volunteers at the first desk will direct you where to go.  [place]  Your provider has ordered a Stress Test with nuclear imaging. The purpose of this test is to evaluate the blood supply to your heart muscle. This procedure is referred to as a "Non-Invasive Stress Test." This is because other than having an IV started in your vein, nothing is inserted or "invades" your body. Cardiac stress tests are done to find areas of poor blood flow to the heart by determining the extent of coronary artery disease (CAD). Some patients exercise on a treadmill, which naturally increases the blood flow to your heart, while others who are unable to walk on a treadmill due to physical limitations will have a pharmacologic/chemical stress agent called Lexiscan . This medicine will mimic walking on a treadmill by temporarily increasing your coronary blood flow.   Please note: these test may take anywhere between 2-4 hours to complete  How to prepare for your Myoview test:  Nothing to eat for 6 hours prior to the test No caffeine for 24 hours prior to test No smoking 24 hours prior to test. Your medication may be taken with water.  If your doctor stopped a medication because of this test, do not take that medication. Ladies, please do not wear dresses.  Skirts or pants are appropriate. Please wear a short sleeve shirt. No perfume, cologne or lotion. Wear comfortable walking shoes. No heels!   PLEASE NOTIFY THE OFFICE AT LEAST 28 HOURS IN ADVANCE IF YOU ARE UNABLE TO KEEP YOUR APPOINTMENT.  [phone] AND  PLEASE NOTIFY NUCLEAR MEDICINE AT [facility] AT LEAST 24 HOURS IN ADVANCE IF YOU ARE UNABLE TO KEEP YOUR APPOINTMENT. [phone]

## 2022-09-09 NOTE — Telephone Encounter (Signed)
Exercise myoview order placed as instructed my MD Message sent to scheduling  Attempted to contact pt to go over instructions. Left message to call back.

## 2022-09-10 ENCOUNTER — Encounter: Payer: Medicare Other | Admitting: Dermatology

## 2022-09-10 DIAGNOSIS — M25512 Pain in left shoulder: Secondary | ICD-10-CM | POA: Diagnosis not present

## 2022-09-10 DIAGNOSIS — M19012 Primary osteoarthritis, left shoulder: Secondary | ICD-10-CM | POA: Diagnosis not present

## 2022-09-10 DIAGNOSIS — M24012 Loose body in left shoulder: Secondary | ICD-10-CM | POA: Diagnosis not present

## 2022-09-10 DIAGNOSIS — M67922 Unspecified disorder of synovium and tendon, left upper arm: Secondary | ICD-10-CM | POA: Diagnosis not present

## 2022-09-10 DIAGNOSIS — M7522 Bicipital tendinitis, left shoulder: Secondary | ICD-10-CM | POA: Diagnosis not present

## 2022-09-12 DIAGNOSIS — M67922 Unspecified disorder of synovium and tendon, left upper arm: Secondary | ICD-10-CM | POA: Diagnosis not present

## 2022-09-12 DIAGNOSIS — M19012 Primary osteoarthritis, left shoulder: Secondary | ICD-10-CM | POA: Diagnosis not present

## 2022-09-12 DIAGNOSIS — M7522 Bicipital tendinitis, left shoulder: Secondary | ICD-10-CM | POA: Diagnosis not present

## 2022-09-12 DIAGNOSIS — M24012 Loose body in left shoulder: Secondary | ICD-10-CM | POA: Diagnosis not present

## 2022-09-12 DIAGNOSIS — M25512 Pain in left shoulder: Secondary | ICD-10-CM | POA: Diagnosis not present

## 2022-09-15 ENCOUNTER — Encounter
Admission: RE | Admit: 2022-09-15 | Discharge: 2022-09-15 | Disposition: A | Discharge status: Home / Self Care | Payer: Medicare Other | Source: Ambulatory Visit | Attending: Internal Medicine | Admitting: Internal Medicine

## 2022-09-15 DIAGNOSIS — I2510 Atherosclerotic heart disease of native coronary artery without angina pectoris: Secondary | ICD-10-CM | POA: Insufficient documentation

## 2022-09-15 LAB — NM MYOCAR MULTI W/SPECT W/WALL MOTION / EF
Angina Index: 0
Base ST Depression (mm): 0 mm
Duke Treadmill Score: 6
Estimated workload: 7
Exercise duration (min): 5 min
Exercise duration (sec): 58 s
LV dias vol: 68 mL (ref 62–150)
LV sys vol: 23 mL
MPHR: 137 {beats}/min
Nuc Stress EF: 66 %
Peak HR: 137 {beats}/min
Percent HR: 97 %
Rest HR: 67 {beats}/min
Rest Nuclear Isotope Dose: 10.4 mCi
SDS: 0
SRS: 2
SSS: 0
ST Depression (mm): 0 mm
Stress Nuclear Isotope Dose: 28.8 mCi
TID: 0.75

## 2022-09-15 MED ORDER — TECHNETIUM TC 99M TETROFOSMIN IV KIT
10.4300 | PACK | Freq: Once | INTRAVENOUS | Status: AC | PRN
  Administered 2022-09-15: 10.43 via INTRAVENOUS

## 2022-09-15 MED ORDER — TECHNETIUM TC 99M TETROFOSMIN IV KIT
28.7800 | PACK | Freq: Once | INTRAVENOUS | Status: AC | PRN
Start: 2022-09-15 — End: 2022-09-15
  Administered 2022-09-15: 28.78 via INTRAVENOUS

## 2022-09-16 DIAGNOSIS — M67922 Unspecified disorder of synovium and tendon, left upper arm: Secondary | ICD-10-CM | POA: Diagnosis not present

## 2022-09-16 DIAGNOSIS — M7522 Bicipital tendinitis, left shoulder: Secondary | ICD-10-CM | POA: Diagnosis not present

## 2022-09-16 DIAGNOSIS — M19012 Primary osteoarthritis, left shoulder: Secondary | ICD-10-CM | POA: Diagnosis not present

## 2022-09-16 DIAGNOSIS — M24012 Loose body in left shoulder: Secondary | ICD-10-CM | POA: Diagnosis not present

## 2022-09-16 DIAGNOSIS — M25512 Pain in left shoulder: Secondary | ICD-10-CM | POA: Diagnosis not present

## 2022-09-18 DIAGNOSIS — M7522 Bicipital tendinitis, left shoulder: Secondary | ICD-10-CM | POA: Diagnosis not present

## 2022-09-18 DIAGNOSIS — M67922 Unspecified disorder of synovium and tendon, left upper arm: Secondary | ICD-10-CM | POA: Diagnosis not present

## 2022-09-18 DIAGNOSIS — M25512 Pain in left shoulder: Secondary | ICD-10-CM | POA: Diagnosis not present

## 2022-09-18 DIAGNOSIS — M24012 Loose body in left shoulder: Secondary | ICD-10-CM | POA: Diagnosis not present

## 2022-09-18 DIAGNOSIS — M19012 Primary osteoarthritis, left shoulder: Secondary | ICD-10-CM | POA: Diagnosis not present

## 2022-09-19 ENCOUNTER — Other Ambulatory Visit: Payer: Medicare Other

## 2022-09-19 ENCOUNTER — Ambulatory Visit (INDEPENDENT_AMBULATORY_CARE_PROVIDER_SITE_OTHER): Payer: Medicare Other

## 2022-09-19 DIAGNOSIS — B351 Tinea unguium: Secondary | ICD-10-CM

## 2022-09-19 DIAGNOSIS — M79676 Pain in unspecified toe(s): Secondary | ICD-10-CM

## 2022-09-19 NOTE — Progress Notes (Signed)
Patient presents today for the maintenance laser treatment. 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   All the nails are looking a lot better. He is pleased with the progress.  All other systems are negative.  Nails were filed thin. Laser therapy was administered to 1-5 toenails bilateral and patient tolerated the treatment well. All safety precautions were in place.   Since patient has had multiple rounds of laser, I recommended laser maintenance every 3 months for preventative. He will follow up in 3 months.

## 2022-09-22 ENCOUNTER — Encounter: Payer: Self-pay | Admitting: Internal Medicine

## 2022-09-23 DIAGNOSIS — M25512 Pain in left shoulder: Secondary | ICD-10-CM | POA: Diagnosis not present

## 2022-09-23 DIAGNOSIS — M19012 Primary osteoarthritis, left shoulder: Secondary | ICD-10-CM | POA: Diagnosis not present

## 2022-09-23 DIAGNOSIS — M7522 Bicipital tendinitis, left shoulder: Secondary | ICD-10-CM | POA: Diagnosis not present

## 2022-09-23 DIAGNOSIS — M67922 Unspecified disorder of synovium and tendon, left upper arm: Secondary | ICD-10-CM | POA: Diagnosis not present

## 2022-09-23 DIAGNOSIS — M24012 Loose body in left shoulder: Secondary | ICD-10-CM | POA: Diagnosis not present

## 2022-09-26 ENCOUNTER — Telehealth: Payer: Medicare Other | Admitting: Physician Assistant

## 2022-09-26 DIAGNOSIS — U071 COVID-19: Secondary | ICD-10-CM | POA: Diagnosis not present

## 2022-09-26 MED ORDER — NIRMATRELVIR/RITONAVIR (PAXLOVID)TABLET: 3.0000 | ORAL_TABLET | Freq: Two times a day (BID) | ORAL | 0 refills | Status: AC

## 2022-09-26 MED ORDER — ALBUTEROL SULFATE HFA 108 (90 BASE) MCG/ACT IN AERS: 1.0000 | INHALATION_SPRAY | Freq: Four times a day (QID) | RESPIRATORY_TRACT | 0 refills | Status: DC | PRN

## 2022-09-26 MED ORDER — BENZONATATE 100 MG PO CAPS: 100.0000 mg | ORAL_CAPSULE | Freq: Three times a day (TID) | ORAL | 0 refills | Status: DC | PRN

## 2022-09-26 NOTE — Progress Notes (Signed)
Virtual Visit Consent   Kevin Terrell, you are scheduled for a virtual visit with a [facility] provider today. Just as with appointments in the office, your consent must be obtained to participate. Your consent will be active for this visit and any virtual visit you may have with one of our providers in the next 365 days. If you have a MyChart account, a copy of this consent can be sent to you electronically.  As this is a virtual visit, video technology does not allow for your provider to perform a traditional examination. This may limit your provider's ability to fully assess your condition. If your provider identifies any concerns that need to be evaluated in person or the need to arrange testing (such as labs, EKG, etc.), we will make arrangements to do so. Although advances in technology are sophisticated, we cannot ensure that it will always work on either your end or our end. If the connection with a video visit is poor, the visit may have to be switched to a telephone visit. With either a video or telephone visit, we are not always able to ensure that we have a secure connection.  By engaging in this virtual visit, you consent to the provision of healthcare and authorize for your insurance to be billed (if applicable) for the services provided during this visit. Depending on your insurance coverage, you may receive a charge related to this service.  I need to obtain your verbal consent now. Are you willing to proceed with your visit today? Kevin Terrell has provided verbal consent on 09/26/2022 for a virtual visit (video or telephone). Mar Daring, PA-C  Date: 09/26/2022 9:44 AM  Virtual Visit via Video Note   I, Mar Daring, connected with  Kevin Terrell  ([id number], 25-Nov-1940) on 09/26/22 at  9:30 AM EST by a video-enabled telemedicine application and verified that I am speaking with the correct person using two identifiers.  Location: Patient: Virtual Visit  Location Patient: Home Provider: Virtual Visit Location Provider: Home Office   I discussed the limitations of evaluation and management by telemedicine and the availability of in person appointments. The patient expressed understanding and agreed to proceed.    History of Present Illness: Kevin Terrell is a 82 y.o. who identifies as a male who was assigned male at birth, and is being seen today for Covid 60.  HPI: URI  This is a new problem. Episode onset: exposed on Sunday, Tested positive for Covid 19 today on at home test; Scratchy throat started Monday; He had tested negative wednesday and thursday. The problem has been gradually worsening. There has been no fever (98.3). Associated symptoms include congestion, coughing, diarrhea (looser stools), headaches, a plugged ear sensation, rhinorrhea, sinus pain and a sore throat. Pertinent negatives include no ear pain, nausea or vomiting. Associated symptoms comments: Chills, body aches. He has tried nothing for the symptoms. The treatment provided no relief.     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   Acute bilateral low back pain without sciatica 05/18/2018   Low serum vitamin B12 10/07/2016    Allergies:  Allergies  Allergen Reactions   Compazine [Prochlorperazine Edisylate]     muscle spasmus    Okra Hives   Other     Okra - hives   Phenylbutazone  Other (See Comments)   Augmentin [Amoxicillin-Pot Clavulanate] Diarrhea    Tolerates Amoxicillin well   Medications:  Current Outpatient Medications:    albuterol (VENTOLIN HFA) 108 (90 Base) MCG/ACT inhaler, Inhale 1-2 puffs into the lungs every 6 (six) hours as needed., Disp: 8 g, Rfl: 0   aspirin EC 81 MG tablet, Take 1 tablet (81 mg total) by mouth daily. Swallow whole., Disp: , Rfl:    benzonatate (TESSALON) 100 MG capsule, Take 1 capsule (100 mg total) by mouth 3 (three) times daily as needed., Disp: 30 capsule, Rfl: 0   cetirizine (ZYRTEC) 10 MG tablet, Take 10 mg by mouth daily., Disp: , Rfl:    cholecalciferol (VITAMIN D3) 25 MCG (1000 UT) tablet, Take 1,000 Units by mouth daily., Disp: , Rfl:    diclofenac Sodium (VOLTAREN) 1 % GEL, APPLY 4GM TO BOTH KNEES 4 TIMES DAILY ASNEEDED, Disp: 200 g, Rfl: 11   famotidine (PEPCID) 20 MG tablet, TAKE 1 TABLET BY MOUTH EVERY NIGHT AT BEDTIME, Disp: 90 tablet, Rfl: 3   fexofenadine (ALLEGRA) 180 MG tablet, Take 180 mg by mouth daily., Disp: , Rfl:    fluticasone (FLONASE) 50 MCG/ACT nasal spray, Place 1 spray into both nostrils 2 (two) times daily., Disp: , Rfl:    gabapentin (NEURONTIN) 300 MG capsule, TAKE 1 CAPSULE BY MOUTH TWICE DAILY, Disp: 180 capsule, Rfl: 3   glucosamine-chondroitin 500-400 MG tablet, Take 1 tablet by mouth 3 (three) times daily., Disp: , Rfl:    ketoconazole (NIZORAL) 2 % cream, APPLY A SMALL AMOUNT TO AFFECTED AREA TWICE DAILY, Disp: 60 g, Rfl: 3   levothyroxine (SYNTHROID) 50 MCG tablet, TAKE 1 TABLET BY MOUTH ONCE A DAY BEFOREBREAKFAST., Disp: 90 tablet, Rfl: 3   meloxicam (MOBIC) 15 MG tablet, Take 1 tablet (15 mg total) by mouth daily. As needed, Disp: 90 tablet, Rfl: 3   montelukast (SINGULAIR) 10 MG tablet, TAKE 1 TABLET BY MOUTH EVERY NIGHT AT BEDTIME, Disp: 90 tablet, Rfl: 3   Multiple Vitamin (MULTIVITAMIN WITH MINERALS) TABS tablet, Take 1 tablet by mouth daily., Disp: , Rfl:    mupirocin ointment (BACTROBAN)  2 %, Apply 1 application topically 3 (three) times daily., Disp: 22 g, Rfl: 0   nirmatrelvir/ritonavir (PAXLOVID) 20 x 150 MG & 10 x 100MG  TABS, Take 3 tablets by mouth 2 (two) times daily for 5 days. (Take nirmatrelvir 150 mg two tablets twice daily for 5 days and ritonavir 100 mg one tablet twice daily for 5 days) Patient GFR is 82, Disp: 30 tablet, Rfl: 0   Probiotic Product (PROBIOTIC PO), Take 1 capsule by mouth 2 (two) times daily., Disp: , Rfl:    rosuvastatin (CRESTOR) 5 MG tablet, Take 1 tablet (5 mg total) by mouth daily., Disp: 90 tablet, Rfl: 2   sildenafil (VIAGRA) 50 MG tablet, Take 1-2 tablets (50-100 mg total) by mouth daily as needed for erectile dysfunction., Disp: 10 tablet, Rfl: 11   triamcinolone ointment (KENALOG) 0.1 %, APPLY TO HANDS AT BEDTIME AND COVER WITHGLOVES AS NEEDED FOR FLARES. AVOID FACE/GROIN/AXILLA., Disp: 80 g, Rfl: 1  Observations/Objective: Patient is well-developed, well-nourished in no acute  distress.  Resting comfortably at home.  Head is normocephalic, atraumatic.  No labored breathing.  Speech is clear and coherent with logical content.  Patient is alert and oriented at baseline.    Assessment and Plan: 1. COVID-19 - nirmatrelvir/ritonavir (PAXLOVID) 20 x 150 MG & 10 x 100MG  TABS; Take 3 tablets by mouth 2 (two) times daily for 5 days. (Take nirmatrelvir 150 mg two tablets twice daily for 5 days and ritonavir 100 mg one tablet twice daily for 5 days) Patient GFR is 82  Dispense: 30 tablet; Refill: 0 - benzonatate (TESSALON) 100 MG capsule; Take 1 capsule (100 mg total) by mouth 3 (three) times daily as needed.  Dispense: 30 capsule; Refill: 0 - albuterol (VENTOLIN HFA) 108 (90 Base) MCG/ACT inhaler; Inhale 1-2 puffs into the lungs every 6 (six) hours as needed.  Dispense: 8 g; Refill: 0  - Continue OTC symptomatic management of choice - Will send OTC vitamins and supplement information through AVS - Paxlovid prescribed - Tessalon for cough -  Albuterol for wheezing and shortness of breath as needed - Patient enrolled in MyChart symptom monitoring - Push fluids - Rest as needed - Discussed return precautions and when to seek in-person evaluation, sent via AVS as well   Follow Up Instructions: I discussed the assessment and treatment plan with the patient. The patient was provided an opportunity to ask questions and all were answered. The patient agreed with the plan and demonstrated an understanding of the instructions.  A copy of instructions were sent to the patient via MyChart unless otherwise noted below.    The patient was advised to call back or seek an in-person evaluation if the symptoms worsen or if the condition fails to improve as anticipated.  Time:  I spent 15 minutes with the patient via telehealth technology discussing the above problems/concerns.    Mar Daring, PA-C

## 2022-09-26 NOTE — Patient Instructions (Signed)
[name], thank you for joining Mar Daring, PA-C for today's virtual visit.  While this provider is not your primary care provider (PCP), if your PCP is located in our provider database this encounter information will be shared with them immediately following your visit.   [place] account gives you access to today's visit and all your visits, tests, and labs performed at [facility] " click here if you don't have a [facility] account or go to [email or web address]  Consent: (Patient) Kevin Terrell provided verbal consent for this virtual visit at the beginning of the encounter.  Current Medications:  Current Outpatient Medications:    albuterol (VENTOLIN HFA) 108 (90 Base) MCG/ACT inhaler, Inhale 1-2 puffs into the lungs every 6 (six) hours as needed., Disp: 8 g, Rfl: 0   aspirin EC 81 MG tablet, Take 1 tablet (81 mg total) by mouth daily. Swallow whole., Disp: , Rfl:    benzonatate (TESSALON) 100 MG capsule, Take 1 capsule (100 mg total) by mouth 3 (three) times daily as needed., Disp: 30 capsule, Rfl: 0   cetirizine (ZYRTEC) 10 MG tablet, Take 10 mg by mouth daily., Disp: , Rfl:    cholecalciferol (VITAMIN D3) 25 MCG (1000 UT) tablet, Take 1,000 Units by mouth daily., Disp: , Rfl:    diclofenac Sodium (VOLTAREN) 1 % GEL, APPLY 4GM TO BOTH KNEES 4 TIMES DAILY ASNEEDED, Disp: 200 g, Rfl: 11   famotidine (PEPCID) 20 MG tablet, TAKE 1 TABLET BY MOUTH EVERY NIGHT AT BEDTIME, Disp: 90 tablet, Rfl: 3   fexofenadine (ALLEGRA) 180 MG tablet, Take 180 mg by mouth daily., Disp: , Rfl:    fluticasone (FLONASE) 50 MCG/ACT nasal spray, Place 1 spray into both nostrils 2 (two) times daily., Disp: , Rfl:    gabapentin (NEURONTIN) 300 MG capsule, TAKE 1 CAPSULE BY MOUTH TWICE DAILY, Disp: 180 capsule, Rfl: 3   glucosamine-chondroitin 500-400 MG tablet, Take 1 tablet by mouth 3 (three) times daily., Disp: , Rfl:    ketoconazole (NIZORAL) 2 % cream,  APPLY A SMALL AMOUNT TO AFFECTED AREA TWICE DAILY, Disp: 60 g, Rfl: 3   levothyroxine (SYNTHROID) 50 MCG tablet, TAKE 1 TABLET BY MOUTH ONCE A DAY BEFOREBREAKFAST., Disp: 90 tablet, Rfl: 3   meloxicam (MOBIC) 15 MG tablet, Take 1 tablet (15 mg total) by mouth daily. As needed, Disp: 90 tablet, Rfl: 3   montelukast (SINGULAIR) 10 MG tablet, TAKE 1 TABLET BY MOUTH EVERY NIGHT AT BEDTIME, Disp: 90 tablet, Rfl: 3   Multiple Vitamin (MULTIVITAMIN WITH MINERALS) TABS tablet, Take 1 tablet by mouth daily., Disp: , Rfl:    mupirocin ointment (BACTROBAN) 2 %, Apply 1 application topically 3 (three) times daily., Disp: 22 g, Rfl: 0   nirmatrelvir/ritonavir (PAXLOVID) 20 x 150 MG & 10 x 100MG  TABS, Take 3 tablets by mouth 2 (two) times daily for 5 days. (Take nirmatrelvir 150 mg two tablets twice daily for 5 days and ritonavir 100 mg one tablet twice daily for 5 days) Patient GFR is 82, Disp: 30 tablet, Rfl: 0   Probiotic Product (PROBIOTIC PO), Take 1 capsule by mouth 2 (two) times daily., Disp: , Rfl:    rosuvastatin (CRESTOR) 5 MG tablet, Take 1 tablet (5 mg total) by mouth daily., Disp: 90 tablet, Rfl: 2   sildenafil (VIAGRA) 50 MG tablet, Take 1-2 tablets (50-100 mg total) by mouth daily as needed for erectile dysfunction., Disp: 10 tablet, Rfl: 11   triamcinolone ointment (KENALOG) 0.1 %, APPLY  TO HANDS AT BEDTIME AND COVER WITHGLOVES AS NEEDED FOR FLARES. AVOID FACE/GROIN/AXILLA., Disp: 80 g, Rfl: 1   Medications ordered in this encounter:  Meds ordered this encounter  Medications   nirmatrelvir/ritonavir (PAXLOVID) 20 x 150 MG & 10 x 100MG  TABS    Sig: Take 3 tablets by mouth 2 (two) times daily for 5 days. (Take nirmatrelvir 150 mg two tablets twice daily for 5 days and ritonavir 100 mg one tablet twice daily for 5 days) Patient GFR is 82    Dispense:  30 tablet    Refill:  0    Order Specific Question:   Supervising Provider    Answer:   Chase Picket [[id number]]   benzonatate (TESSALON) 100 MG  capsule    Sig: Take 1 capsule (100 mg total) by mouth 3 (three) times daily as needed.    Dispense:  30 capsule    Refill:  0    Order Specific Question:   Supervising Provider    Answer:   Bari Mantis   albuterol (VENTOLIN HFA) 108 (90 Base) MCG/ACT inhaler    Sig: Inhale 1-2 puffs into the lungs every 6 (six) hours as needed.    Dispense:  8 g    Refill:  0    Order Specific Question:   Supervising Provider    Answer:   Chase Picket [id number]     *If you need refills on other medications prior to your next appointment, please contact your pharmacy*  Follow-Up: Call back or seek an in-person evaluation if the symptoms worsen or if the condition fails to improve as anticipated.  [place] [phone]  Other Instructions  [name], thank you for joining Mar Daring, PA-C for today's virtual visit.  While this provider is not your primary care provider (PCP), if your PCP is located in our provider database this encounter information will be shared with them immediately following your visit.   [place] account gives you access to today's visit and all your visits, tests, and labs performed at [facility] " click here if you don't have a [place] account or go to [email or web address]  Consent: (Patient) Kevin Terrell provided verbal consent for this virtual visit at the beginning of the encounter.  Current Medications:  Current Outpatient Medications:    albuterol (VENTOLIN HFA) 108 (90 Base) MCG/ACT inhaler, Inhale 1-2 puffs into the lungs every 6 (six) hours as needed., Disp: 8 g, Rfl: 0   aspirin EC 81 MG tablet, Take 1 tablet (81 mg total) by mouth daily. Swallow whole., Disp: , Rfl:    benzonatate (TESSALON) 100 MG capsule, Take 1 capsule (100 mg total) by mouth 3 (three) times daily as needed., Disp: 30 capsule, Rfl: 0   cetirizine (ZYRTEC) 10 MG tablet, Take 10 mg by mouth daily.,  Disp: , Rfl:    cholecalciferol (VITAMIN D3) 25 MCG (1000 UT) tablet, Take 1,000 Units by mouth daily., Disp: , Rfl:    diclofenac Sodium (VOLTAREN) 1 % GEL, APPLY 4GM TO BOTH KNEES 4 TIMES DAILY ASNEEDED, Disp: 200 g, Rfl: 11   famotidine (PEPCID) 20 MG tablet, TAKE 1 TABLET BY MOUTH EVERY NIGHT AT BEDTIME, Disp: 90 tablet, Rfl: 3   fexofenadine (ALLEGRA) 180 MG tablet, Take 180 mg by mouth daily., Disp: , Rfl:    fluticasone (FLONASE) 50 MCG/ACT nasal spray, Place 1 spray into both nostrils 2 (two) times daily., Disp: , Rfl:  gabapentin (NEURONTIN) 300 MG capsule, TAKE 1 CAPSULE BY MOUTH TWICE DAILY, Disp: 180 capsule, Rfl: 3   glucosamine-chondroitin 500-400 MG tablet, Take 1 tablet by mouth 3 (three) times daily., Disp: , Rfl:    ketoconazole (NIZORAL) 2 % cream, APPLY A SMALL AMOUNT TO AFFECTED AREA TWICE DAILY, Disp: 60 g, Rfl: 3   levothyroxine (SYNTHROID) 50 MCG tablet, TAKE 1 TABLET BY MOUTH ONCE A DAY BEFOREBREAKFAST., Disp: 90 tablet, Rfl: 3   meloxicam (MOBIC) 15 MG tablet, Take 1 tablet (15 mg total) by mouth daily. As needed, Disp: 90 tablet, Rfl: 3   montelukast (SINGULAIR) 10 MG tablet, TAKE 1 TABLET BY MOUTH EVERY NIGHT AT BEDTIME, Disp: 90 tablet, Rfl: 3   Multiple Vitamin (MULTIVITAMIN WITH MINERALS) TABS tablet, Take 1 tablet by mouth daily., Disp: , Rfl:    mupirocin ointment (BACTROBAN) 2 %, Apply 1 application topically 3 (three) times daily., Disp: 22 g, Rfl: 0   nirmatrelvir/ritonavir (PAXLOVID) 20 x 150 MG & 10 x 100MG  TABS, Take 3 tablets by mouth 2 (two) times daily for 5 days. (Take nirmatrelvir 150 mg two tablets twice daily for 5 days and ritonavir 100 mg one tablet twice daily for 5 days) Patient GFR is 82, Disp: 30 tablet, Rfl: 0   Probiotic Product (PROBIOTIC PO), Take 1 capsule by mouth 2 (two) times daily., Disp: , Rfl:    rosuvastatin (CRESTOR) 5 MG tablet, Take 1 tablet (5 mg total) by mouth daily., Disp: 90 tablet, Rfl: 2   sildenafil (VIAGRA) 50 MG tablet,  Take 1-2 tablets (50-100 mg total) by mouth daily as needed for erectile dysfunction., Disp: 10 tablet, Rfl: 11   triamcinolone ointment (KENALOG) 0.1 %, APPLY TO HANDS AT BEDTIME AND COVER WITHGLOVES AS NEEDED FOR FLARES. AVOID FACE/GROIN/AXILLA., Disp: 80 g, Rfl: 1   Medications ordered in this encounter:  Meds ordered this encounter  Medications   nirmatrelvir/ritonavir (PAXLOVID) 20 x 150 MG & 10 x 100MG  TABS    Sig: Take 3 tablets by mouth 2 (two) times daily for 5 days. (Take nirmatrelvir 150 mg two tablets twice daily for 5 days and ritonavir 100 mg one tablet twice daily for 5 days) Patient GFR is 82    Dispense:  30 tablet    Refill:  0    Order Specific Question:   Supervising Provider    Answer:   Chase Picket [[id number]]   benzonatate (TESSALON) 100 MG capsule    Sig: Take 1 capsule (100 mg total) by mouth 3 (three) times daily as needed.    Dispense:  30 capsule    Refill:  0    Order Specific Question:   Supervising Provider    Answer:   Bari Mantis   albuterol (VENTOLIN HFA) 108 (90 Base) MCG/ACT inhaler    Sig: Inhale 1-2 puffs into the lungs every 6 (six) hours as needed.    Dispense:  8 g    Refill:  0    Order Specific Question:   Supervising Provider    Answer:   Chase Picket [id number]     *If you need refills on other medications prior to your next appointment, please contact your pharmacy*  Follow-Up: Call back or seek an in-person evaluation if the symptoms worsen or if the condition fails to improve as anticipated.  [place] [phone]  Isolation Instructions: You are to isolate at home for 5 days from onset of your symptoms. If you must be around  other household members who do not have symptoms, you need to make sure that both you and the family members are masking consistently with a high-quality mask.  After day 5 of isolation, if you have had no fever within 24 hours and you are feeling better, you can end  isolation but need to mask for an additional 5 days.  After day 5 if you have a fever or are having significant symptoms, please isolate for full 10 days.  If you note any worsening of symptoms despite treatment, please seek an in-person evaluation ASAP. If you note any significant shortness of breath or any chest pain, please seek ER evaluation. Please do not delay care!   COVID-19: What to Do if You Are Sick If you test positive and are an older adult or someone who is at high risk of getting very sick from COVID-19, treatment may be available. Contact a healthcare provider right away after a positive test to determine if you are eligible, even if your symptoms are mild right now. You can also visit a Test to Treat location and, if eligible, receive a prescription from a provider. Don't delay: Treatment must be started within the first few days to be effective. If you have a fever, cough, or other symptoms, you might have COVID-19. Most people have mild illness and are able to recover at home. If you are sick: Keep track of your symptoms. If you have an emergency warning sign (including trouble breathing), call 911. Steps to help prevent the spread of COVID-19 if you are sick If you are sick with COVID-19 or think you might have COVID-19, follow the steps below to care for yourself and to help protect other people in your home and community. Stay home except to get medical care Stay home. Most people with COVID-19 have mild illness and can recover at home without medical care. Do not leave your home, except to get medical care. Do not visit public areas and do not go to places where you are unable to wear a mask. Take care of yourself. Get rest and stay hydrated. Take over-the-counter medicines, such as acetaminophen, to help you feel better. Stay in touch with your doctor. Call before you get medical care. Be sure to get care if you have trouble breathing, or have any other emergency warning  signs, or if you think it is an emergency. Avoid public transportation, ride-sharing, or taxis if possible. Get tested If you have symptoms of COVID-19, get tested. While waiting for test results, stay away from others, including staying apart from those living in your household. Get tested as soon as possible after your symptoms start. Treatments may be available for people with COVID-19 who are at risk for becoming very sick. Don't delay: Treatment must be started early to be effective--some treatments must begin within 5 days of your first symptoms. Contact your healthcare provider right away if your test result is positive to determine if you are eligible. Self-tests are one of several options for testing for the virus that causes COVID-19 and may be more convenient than laboratory-based tests and point-of-care tests. Ask your healthcare provider or your local health department if you need help interpreting your test results. You can visit your state, tribal, local, and territorial health department's website to look for the latest local information on testing sites. Separate yourself from other people As much as possible, stay in a specific room and away from other people and pets in your home. If possible,  you should use a separate bathroom. If you need to be around other people or animals in or outside of the home, wear a well-fitting mask. Tell your close contacts that they may have been exposed to COVID-19. An infected person can spread COVID-19 starting 48 hours (or 2 days) before the person has any symptoms or tests positive. By letting your close contacts know they may have been exposed to COVID-19, you are helping to protect everyone. See COVID-19 and Animals if you have questions about pets. If you are diagnosed with COVID-19, someone from the health department may call you. Answer the call to slow the spread. Monitor your symptoms Symptoms of COVID-19 include fever, cough, or other  symptoms. Follow care instructions from your healthcare provider and local health department. Your local health authorities may give instructions on checking your symptoms and reporting information. When to seek emergency medical attention Look for emergency warning signs* for COVID-19. If someone is showing any of these signs, seek emergency medical care immediately: Trouble breathing Persistent pain or pressure in the chest New confusion Inability to wake or stay awake Pale, gray, or blue-colored skin, lips, or nail beds, depending on skin tone *This list is not all possible symptoms. Please call your medical provider for any other symptoms that are severe or concerning to you. Call 911 or call ahead to your local emergency facility: Notify the operator that you are seeking care for someone who has or may have COVID-19. Call ahead before visiting your doctor Call ahead. Many medical visits for routine care are being postponed or done by phone or telemedicine. If you have a medical appointment that cannot be postponed, call your doctor's office, and tell them you have or may have COVID-19. This will help the office protect themselves and other patients. If you are sick, wear a well-fitting mask You should wear a mask if you must be around other people or animals, including pets (even at home). Wear a mask with the best fit, protection, and comfort for you. You don't need to wear the mask if you are alone. If you can't put on a mask (because of trouble breathing, for example), cover your coughs and sneezes in some other way. Try to stay at least 6 feet away from other people. This will help protect the people around you. Masks should not be placed on young children under age 26 years, anyone who has trouble breathing, or anyone who is not able to remove the mask without help. Cover your coughs and sneezes Cover your mouth and nose with a tissue when you cough or sneeze. Throw away used tissues in  a lined trash can. Immediately wash your hands with soap and water for at least 20 seconds. If soap and water are not available, clean your hands with an alcohol-based hand sanitizer that contains at least 60% alcohol. Clean your hands often Wash your hands often with soap and water for at least 20 seconds. This is especially important after blowing your nose, coughing, or sneezing; going to the bathroom; and before eating or preparing food. Use hand sanitizer if soap and water are not available. Use an alcohol-based hand sanitizer with at least 60% alcohol, covering all surfaces of your hands and rubbing them together until they feel dry. Soap and water are the best option, especially if hands are visibly dirty. Avoid touching your eyes, nose, and mouth with unwashed hands. Handwashing Tips Avoid sharing personal household items Do not share dishes, drinking glasses, cups, eating  utensils, towels, or bedding with other people in your home. Wash these items thoroughly after using them with soap and water or put in the dishwasher. Clean surfaces in your home regularly Clean and disinfect high-touch surfaces (for example, doorknobs, tables, handles, light switches, and countertops) in your "sick room" and bathroom. In shared spaces, you should clean and disinfect surfaces and items after each use by the person who is ill. If you are sick and cannot clean, a caregiver or other person should only clean and disinfect the area around you (such as your bedroom and bathroom) on an as needed basis. Your caregiver/other person should wait as long as possible (at least several hours) and wear a mask before entering, cleaning, and disinfecting shared spaces that you use. Clean and disinfect areas that may have blood, stool, or body fluids on them. Use household cleaners and disinfectants. Clean visible dirty surfaces with household cleaners containing soap or detergent. Then, use a household disinfectant. Use a  product from [employer] List N: Disinfectants for Coronavirus (ELFYB-01). Be sure to follow the instructions on the label to ensure safe and effective use of the product. Many products recommend keeping the surface wet with a disinfectant for a certain period of time (look at "contact time" on the product label). You may also need to wear personal protective equipment, such as gloves, depending on the directions on the product label. Immediately after disinfecting, wash your hands with soap and water for 20 seconds. For completed guidance on cleaning and disinfecting your home, visit Complete Disinfection Guidance. Take steps to improve ventilation at home Improve ventilation (air flow) at home to help prevent from spreading COVID-19 to other people in your household. Clear out COVID-19 virus particles in the air by opening windows, using air filters, and turning on fans in your home. Use this interactive tool to learn how to improve air flow in your home. When you can be around others after being sick with COVID-19 Deciding when you can be around others is different for different situations. Find out when you can safely end home isolation. For any additional questions about your care, contact your healthcare provider or state or local health department. 11/20/2020 Content source: [facility] ([facility]), Division of Viral Diseases This information is not intended to replace advice given to you by your health care provider. Make sure you discuss any questions you have with your health care provider. Document Revised: 01/03/2021 Document Reviewed: 01/03/2021 Elsevier Patient Education  2022 [employer].       If you have been instructed to have an in-person evaluation today at a local Urgent Care facility, please use the link below. It will take you to a list of all of our available [facility], including address, phone number and hours of  operation. Please do not delay care.  [facility]  If you or a family member do not have a primary care provider, use the link below to schedule a visit and establish care. When you choose a [facility] primary care physician or advanced practice provider, you gain a long-term partner in health. Find a Primary Care Provider  Learn more about [facility]'s in-office and virtual care options: [facility]    If you have been instructed to have an in-person evaluation today at a local Urgent Care facility, please use the link below. It will take you to a list of all of our available [facility], including address, phone number and hours of operation. Please do not delay care.  [facility]  If you or a family member do not have a primary care provider, use the link below to schedule a visit and establish care. When you choose a [facility] primary care physician or advanced practice provider, you gain a long-term partner in health. Find a Primary Care Provider  Learn more about [facility]'s in-office and virtual care options: [facility]

## 2022-10-07 DIAGNOSIS — M7522 Bicipital tendinitis, left shoulder: Secondary | ICD-10-CM | POA: Diagnosis not present

## 2022-10-07 DIAGNOSIS — M25512 Pain in left shoulder: Secondary | ICD-10-CM | POA: Diagnosis not present

## 2022-10-07 DIAGNOSIS — M19012 Primary osteoarthritis, left shoulder: Secondary | ICD-10-CM | POA: Diagnosis not present

## 2022-10-07 DIAGNOSIS — M67922 Unspecified disorder of synovium and tendon, left upper arm: Secondary | ICD-10-CM | POA: Diagnosis not present

## 2022-10-07 DIAGNOSIS — M24012 Loose body in left shoulder: Secondary | ICD-10-CM | POA: Diagnosis not present

## 2022-10-08 ENCOUNTER — Ambulatory Visit: Payer: Medicare Other | Admitting: Urology

## 2022-10-09 DIAGNOSIS — M19012 Primary osteoarthritis, left shoulder: Secondary | ICD-10-CM | POA: Diagnosis not present

## 2022-10-09 DIAGNOSIS — M7522 Bicipital tendinitis, left shoulder: Secondary | ICD-10-CM | POA: Diagnosis not present

## 2022-10-09 DIAGNOSIS — M67922 Unspecified disorder of synovium and tendon, left upper arm: Secondary | ICD-10-CM | POA: Diagnosis not present

## 2022-10-09 DIAGNOSIS — M24012 Loose body in left shoulder: Secondary | ICD-10-CM | POA: Diagnosis not present

## 2022-10-09 DIAGNOSIS — M25512 Pain in left shoulder: Secondary | ICD-10-CM | POA: Diagnosis not present

## 2022-10-13 ENCOUNTER — Other Ambulatory Visit: Payer: Self-pay | Admitting: Internal Medicine

## 2022-10-14 DIAGNOSIS — M19012 Primary osteoarthritis, left shoulder: Secondary | ICD-10-CM | POA: Diagnosis not present

## 2022-10-14 DIAGNOSIS — M7522 Bicipital tendinitis, left shoulder: Secondary | ICD-10-CM | POA: Diagnosis not present

## 2022-10-14 DIAGNOSIS — M25512 Pain in left shoulder: Secondary | ICD-10-CM | POA: Diagnosis not present

## 2022-10-14 DIAGNOSIS — M67922 Unspecified disorder of synovium and tendon, left upper arm: Secondary | ICD-10-CM | POA: Diagnosis not present

## 2022-10-14 DIAGNOSIS — M24012 Loose body in left shoulder: Secondary | ICD-10-CM | POA: Diagnosis not present

## 2022-10-16 ENCOUNTER — Other Ambulatory Visit: Payer: Self-pay | Admitting: Internal Medicine

## 2022-10-16 DIAGNOSIS — M24012 Loose body in left shoulder: Secondary | ICD-10-CM | POA: Diagnosis not present

## 2022-10-16 DIAGNOSIS — M25512 Pain in left shoulder: Secondary | ICD-10-CM | POA: Diagnosis not present

## 2022-10-16 DIAGNOSIS — M67922 Unspecified disorder of synovium and tendon, left upper arm: Secondary | ICD-10-CM | POA: Diagnosis not present

## 2022-10-16 DIAGNOSIS — M7522 Bicipital tendinitis, left shoulder: Secondary | ICD-10-CM | POA: Diagnosis not present

## 2022-10-16 DIAGNOSIS — M19012 Primary osteoarthritis, left shoulder: Secondary | ICD-10-CM | POA: Diagnosis not present

## 2022-10-21 DIAGNOSIS — M25512 Pain in left shoulder: Secondary | ICD-10-CM | POA: Diagnosis not present

## 2022-10-21 DIAGNOSIS — M67922 Unspecified disorder of synovium and tendon, left upper arm: Secondary | ICD-10-CM | POA: Diagnosis not present

## 2022-10-21 DIAGNOSIS — M24012 Loose body in left shoulder: Secondary | ICD-10-CM | POA: Diagnosis not present

## 2022-10-21 DIAGNOSIS — M19012 Primary osteoarthritis, left shoulder: Secondary | ICD-10-CM | POA: Diagnosis not present

## 2022-10-21 DIAGNOSIS — M7522 Bicipital tendinitis, left shoulder: Secondary | ICD-10-CM | POA: Diagnosis not present

## 2022-10-23 ENCOUNTER — Ambulatory Visit (INDEPENDENT_AMBULATORY_CARE_PROVIDER_SITE_OTHER): Payer: Medicare Other | Admitting: Dermatology

## 2022-10-23 ENCOUNTER — Encounter: Payer: Self-pay | Admitting: Dermatology

## 2022-10-23 VITALS — BP 122/66 | HR 56

## 2022-10-23 DIAGNOSIS — L820 Inflamed seborrheic keratosis: Secondary | ICD-10-CM

## 2022-10-23 DIAGNOSIS — Z1283 Encounter for screening for malignant neoplasm of skin: Secondary | ICD-10-CM | POA: Diagnosis not present

## 2022-10-23 DIAGNOSIS — L821 Other seborrheic keratosis: Secondary | ICD-10-CM

## 2022-10-23 DIAGNOSIS — D229 Melanocytic nevi, unspecified: Secondary | ICD-10-CM | POA: Diagnosis not present

## 2022-10-23 DIAGNOSIS — L570 Actinic keratosis: Secondary | ICD-10-CM | POA: Diagnosis not present

## 2022-10-23 DIAGNOSIS — Z85828 Personal history of other malignant neoplasm of skin: Secondary | ICD-10-CM | POA: Diagnosis not present

## 2022-10-23 DIAGNOSIS — L578 Other skin changes due to chronic exposure to nonionizing radiation: Secondary | ICD-10-CM

## 2022-10-23 DIAGNOSIS — L814 Other melanin hyperpigmentation: Secondary | ICD-10-CM | POA: Diagnosis not present

## 2022-10-23 DIAGNOSIS — L905 Scar conditions and fibrosis of skin: Secondary | ICD-10-CM

## 2022-10-23 NOTE — Progress Notes (Signed)
Follow-Up Visit   Subjective  Kevin Terrell is a 82 y.o. male who presents for the following: Annual Exam (Hx of SCC. Areas of concern on back. Growth between toes on left foot) and Actinic Keratosis (Recheck face. Hx of PDT treatment in November. Some areas have not resolved. R medial ankle, Hx of AK frozen several times, would like rechecked).  The patient presents for Total-Body Skin Exam (TBSE) for skin cancer screening and mole check.  The patient has spots, moles and lesions to be evaluated, some may be new or changing and the patient has concerns that these could be cancer.  Significant other Geannie Risen with patient.   The following portions of the chart were reviewed this encounter and updated as appropriate:  Tobacco  Allergies  Meds  Problems  Med Hx  Surg Hx  Fam Hx      Review of Systems: No other skin or systemic complaints except as noted in HPI or Assessment and Plan.   Objective  Well appearing patient in no apparent distress; mood and affect are within normal limits.  A full examination was performed including scalp, head, eyes, ears, nose, lips, neck, chest, axillae, abdomen, back, buttocks, bilateral upper extremities, bilateral lower extremities, hands, feet, fingers, toes, fingernails, and toenails. All findings within normal limits unless otherwise noted below.  Left Dorsal Hand x1, left upper arm x1, left chest x5, right chest x1, right abdomen x1, back x6 (15) Erythematous keratotic or waxy stuck-on papule or plaque.  L Upper Arm x1, L great toe at base x1, R upper arm x1, L forehead x2, L angle of mandible x1, L helix x1, R forehead above brow x1 (8) Erythematous thin papules/macules with gritty scale.   Right medial lower leg Hyperpigmented atrophic patch  Right Upper Arm x1, R preauricular x1 (2) Erythematous keratotic papule   Assessment & Plan   History of Squamous Cell Carcinoma of the Skin - No evidence of recurrence today - No  lymphadenopathy - Recommend regular full body skin exams - Recommend daily broad spectrum sunscreen SPF 30+ to sun-exposed areas, reapply every 2 hours as needed.  - Call if any new or changing lesions are noted between office visits  Lentigines - Scattered tan macules - Due to sun exposure - Benign-appearing, observe - Recommend daily broad spectrum sunscreen SPF 30+ to sun-exposed areas, reapply every 2 hours as needed. - Call for any changes  Seborrheic Keratoses - Stuck-on, waxy, tan-brown papules and/or plaques  - Benign-appearing - Discussed benign etiology and prognosis. - Observe - Call for any changes  Melanocytic Nevi - Tan-brown and/or pink-flesh-colored symmetric macules and papules - Benign appearing on exam today - Observation - Call clinic for new or changing moles - Recommend daily use of broad spectrum spf 30+ sunscreen to sun-exposed areas.   Hemangiomas - Red papules - Discussed benign nature - Observe - Call for any changes  Actinic Damage - Chronic condition, secondary to cumulative UV/sun exposure - diffuse scaly erythematous macules with underlying dyspigmentation - Recommend daily broad spectrum sunscreen SPF 30+ to sun-exposed areas, reapply every 2 hours as needed.  - Staying in the shade or wearing long sleeves, sun glasses (UVA+UVB protection) and wide brim hats (4-inch brim around the entire circumference of the hat) are also recommended for sun protection.  - Call for new or changing lesions.  Skin cancer screening performed today.  Inflamed seborrheic keratosis (15) Left Dorsal Hand x1, left upper arm x1, left chest x5, right chest x1,  right abdomen x1, back x6  Symptomatic, irritating, patient would like treated. He has circled the lesions that are irritated and significantly itchy for him.  Benign-appearing.  Call clinic for new or changing lesions.   Prior to procedure, discussed risks of blister formation, small wound, skin  dyspigmentation, or rare scar following treatment. Recommend Vaseline ointment to treated areas while healing.   Destruction of lesion - Left Dorsal Hand x1, left upper arm x1, left chest x5, right chest x1, right abdomen x1, back x6  Destruction method: cryotherapy   Informed consent: discussed and consent obtained   Lesion destroyed using liquid nitrogen: Yes   Region frozen until ice ball extended beyond lesion: Yes   Outcome: patient tolerated procedure well with no complications   Post-procedure details: wound care instructions given   Additional details:  Prior to procedure, discussed risks of blister formation, small wound, skin dyspigmentation, or rare scar following cryotherapy. Recommend Vaseline ointment to treated areas while healing.   AK (actinic keratosis) (8) L Upper Arm x1, L great toe at base x1, R upper arm x1, L forehead x2, L angle of mandible x1, L helix x1, R forehead above brow x1  Favor AK at left great toe base  Actinic keratoses are precancerous spots that appear secondary to cumulative UV radiation exposure/sun exposure over time. They are chronic with expected duration over 1 year. A portion of actinic keratoses will progress to squamous cell carcinoma of the skin. It is not possible to reliably predict which spots will progress to skin cancer and so treatment is recommended to prevent development of skin cancer.  Recommend daily broad spectrum sunscreen SPF 30+ to sun-exposed areas, reapply every 2 hours as needed.  Recommend staying in the shade or wearing long sleeves, sun glasses (UVA+UVB protection) and wide brim hats (4-inch brim around the entire circumference of the hat). Call for new or changing lesions.  Destruction of lesion - L Upper Arm x1, L great toe at base x1, R upper arm x1, L forehead x2, L angle of mandible x1, L helix x1, R forehead above brow x1  Destruction method: cryotherapy   Informed consent: discussed and consent obtained   Lesion  destroyed using liquid nitrogen: Yes   Region frozen until ice ball extended beyond lesion: Yes   Outcome: patient tolerated procedure well with no complications   Post-procedure details: wound care instructions given   Additional details:  Prior to procedure, discussed risks of blister formation, small wound, skin dyspigmentation, or rare scar following cryotherapy. Recommend Vaseline ointment to treated areas while healing.   Scar Right medial lower leg  Benign-appearing.  Observation.  Call clinic for new or changing lesions.    Hypertrophic actinic keratosis (2) Right Upper Arm x1, R preauricular x1  Actinic keratoses are precancerous spots that appear secondary to cumulative UV radiation exposure/sun exposure over time. They are chronic with expected duration over 1 year. A portion of actinic keratoses will progress to squamous cell carcinoma of the skin. It is not possible to reliably predict which spots will progress to skin cancer and so treatment is recommended to prevent development of skin cancer.  Recommend daily broad spectrum sunscreen SPF 30+ to sun-exposed areas, reapply every 2 hours as needed.  Recommend staying in the shade or wearing long sleeves, sun glasses (UVA+UVB protection) and wide brim hats (4-inch brim around the entire circumference of the hat). Call for new or changing lesions.  Destruction of lesion - Right Upper Arm x1, R preauricular  x1  Destruction method: cryotherapy   Informed consent: discussed and consent obtained   Lesion destroyed using liquid nitrogen: Yes   Region frozen until ice ball extended beyond lesion: Yes   Outcome: patient tolerated procedure well with no complications   Post-procedure details: wound care instructions given   Additional details:  Prior to procedure, discussed risks of blister formation, small wound, skin dyspigmentation, or rare scar following cryotherapy. Recommend Vaseline ointment to treated areas while healing.     Return for AK f/u 3 months, also ? shave removal cherry hemangioma, f/u [facility].  I, Emelia Salisbury, CMA, am acting as scribe for Forest Gleason, MD.  Documentation: I have reviewed the above documentation for accuracy and completeness, and I agree with the above.  Forest Gleason, MD

## 2022-10-23 NOTE — Patient Instructions (Signed)
Cryotherapy Aftercare  Wash gently with soap and water everyday.   Apply Vaseline daily until healed.    Recommend daily broad spectrum sunscreen SPF 30+ to sun-exposed areas, reapply every 2 hours as needed. Call for new or changing lesions.  Staying in the shade or wearing long sleeves, sun glasses (UVA+UVB protection) and wide brim hats (4-inch brim around the entire circumference of the hat) are also recommended for sun protection.    Recommend taking Heliocare sun protection supplement daily in sunny weather for additional sun protection. For maximum protection on the sunniest days, you can take up to 2 capsules of regular Heliocare OR take 1 capsule of Heliocare Ultra. For prolonged exposure (such as a full day in the sun), you can repeat your dose of the supplement 4 hours after your first dose. Heliocare can be purchased at [employer], at some [facility] or at [email or web address].    Melanoma ABCDEs  Melanoma is the most dangerous type of skin cancer, and is the leading cause of death from skin disease.  You are more likely to develop melanoma if you: Have light-colored skin, light-colored eyes, or red or blond hair Spend a lot of time in the sun Tan regularly, either outdoors or in a tanning bed Have had blistering sunburns, especially during childhood Have a close family member who has had a melanoma Have atypical moles or large birthmarks  Early detection of melanoma is key since treatment is typically straightforward and cure rates are extremely high if we catch it early.   The first sign of melanoma is often a change in a mole or a new dark spot.  The ABCDE system is a way of remembering the signs of melanoma.  A for asymmetry:  The two halves do not match. B for border:  The edges of the growth are irregular. C for color:  A mixture of colors are present instead of an even brown color. D for diameter:  Melanomas are usually (but not always) greater than 70mm - the  size of a pencil eraser. E for evolution:  The spot keeps changing in size, shape, and color.  Please check your skin once per month between visits. You can use a small mirror in front and a large mirror behind you to keep an eye on the back side or your body.   If you see any new or changing lesions before your next follow-up, please call to schedule a visit.  Please continue daily skin protection including broad spectrum sunscreen SPF 30+ to sun-exposed areas, reapplying every 2 hours as needed when you're outdoors.   Staying in the shade or wearing long sleeves, sun glasses (UVA+UVB protection) and wide brim hats (4-inch brim around the entire circumference of the hat) are also recommended for sun protection.    Due to recent changes in healthcare laws, you may see results of your pathology and/or laboratory studies on MyChart before the doctors have had a chance to review them. We understand that in some cases there may be results that are confusing or concerning to you. Please understand that not all results are received at the same time and often the doctors may need to interpret multiple results in order to provide you with the best plan of care or course of treatment. Therefore, we ask that you please give [place] 2 business days to thoroughly review all your results before contacting the office for clarification. Should we see a critical lab result, you will be contacted sooner.  If You Need Anything After Your Visit  If you have any questions or concerns for your doctor, please call our main line at [phone] and press option 4 to reach your doctor's medical assistant. If no one answers, please leave a voicemail as directed and we will return your call as soon as possible. Messages left after 4 pm will be answered the following business day.   You may also send [place] a message via [place]. We typically respond to MyChart messages within 1-2 business days.  For prescription refills, please  ask your pharmacy to contact our office. Our fax number is [phone].  If you have an urgent issue when the clinic is closed that cannot wait until the next business day, you can page your doctor at the number below.    Please note that while we do our best to be available for urgent issues outside of office hours, we are not available 24/7.   If you have an urgent issue and are unable to reach [place], you may choose to seek medical care at your doctor's office, retail clinic, urgent care center, or emergency room.  If you have a medical emergency, please immediately call 911 or go to the emergency department.  Pager Numbers  - Dr. Nehemiah Massed: [phone]  - Dr. Laurence Ferrari: [phone]  - Dr. Nicole Kindred: [phone]  In the event of inclement weather, please call our main line at [phone] for an update on the status of any delays or closures.  Dermatology Medication Tips: Please keep the boxes that topical medications come in in order to help keep track of the instructions about where and how to use these. Pharmacies typically print the medication instructions only on the boxes and not directly on the medication tubes.   If your medication is too expensive, please contact our office at [phone] option 4 or send [place] a message through [place].   We are unable to tell what your co-pay for medications will be in advance as this is different depending on your insurance coverage. However, we may be able to find a substitute medication at lower cost or fill out paperwork to get insurance to cover a needed medication.   If a prior authorization is required to get your medication covered by your insurance company, please allow [place] 1-2 business days to complete this process.  Drug prices often vary depending on where the prescription is filled and some pharmacies may offer cheaper prices.  The website www.goodrx.com contains coupons for medications through different pharmacies. The prices here do  not account for what the cost may be with help from insurance (it may be cheaper with your insurance), but the website can give you the price if you did not use any insurance.  - You can print the associated coupon and take it with your prescription to the pharmacy.  - You may also stop by our office during regular business hours and pick up a GoodRx coupon card.  - If you need your prescription sent electronically to a different pharmacy, notify our office through [facility] or by phone at [phone] option 4.     Si Usted Necesita Algo Despus de Su Visita  Tambin puede enviarnos un mensaje a travs de Pharmacist, community. Por lo general respondemos a los mensajes de MyChart en el transcurso de 1 a 2 das hbiles.  Para renovar recetas, por favor pida a su farmacia que se ponga en contacto con nuestra oficina. Harland Dingwall de fax es [place] [phone].  Si  tiene un asunto urgente cuando la clnica est cerrada y que no puede esperar hasta el siguiente da hbil, puede llamar/localizar a su doctor(a) al nmero que aparece a continuacin.   Por favor, tenga en cuenta que aunque hacemos todo lo posible para estar disponibles para asuntos urgentes fuera del horario de [place], no estamos disponibles las 24 horas del da, los 7 das de la [place].   Si tiene un problema urgente y no puede comunicarse con nosotros, puede optar por buscar atencin mdica  en el consultorio de su doctor(a), en una clnica privada, en un centro de atencin urgente o en una sala de emergencias.  Si tiene Engineering geologist, por favor llame inmediatamente al 911 o vaya a la sala de emergencias.  Nmeros de bper  - Dr. Nehemiah Massed: [phone]  - Dra. Moye: [phone]  - Dra. Nicole Kindred: [phone]  En caso de inclemencias del [place], por favor llame a Johnsie Kindred principal al [phone] para una actualizacin sobre el [place] de cualquier retraso o cierre.  Consejos para la medicacin en dermatologa: Por  favor, guarde las cajas en las que vienen los medicamentos de uso tpico para ayudarle a seguir las instrucciones sobre dnde y cmo usarlos. Las farmacias generalmente imprimen las instrucciones del medicamento slo en las cajas y no directamente en los tubos del [place].   Si su medicamento es muy caro, por favor, pngase en contacto con Zigmund Daniel llamando al [phone] y presione la opcin 4 o envenos un mensaje a travs de Pharmacist, community.   No podemos decirle cul ser su copago por los medicamentos por adelantado ya que esto es diferente dependiendo de la cobertura de su seguro. Sin embargo, es posible que podamos encontrar un medicamento sustituto a Electrical engineer un formulario para que el seguro cubra el medicamento que se considera necesario.   Si se requiere una autorizacin previa para que su compaa de seguros [place] su medicamento, por favor permtanos de 1 a 2 das hbiles para completar este proceso.  Los precios de los medicamentos varan con frecuencia dependiendo del Environmental consultant de dnde se surte la receta y alguna farmacias pueden ofrecer precios ms baratos.  El sitio web www.goodrx.com tiene cupones para medicamentos de Airline pilot. Los precios aqu no tienen en cuenta lo que podra costar con la ayuda del seguro (puede ser ms barato con su seguro), pero el sitio web puede darle el precio si no utiliz Research scientist (physical sciences).  - Puede imprimir el cupn correspondiente y llevarlo con su receta a la farmacia.  - Tambin puede pasar por nuestra oficina durante el horario de atencin regular y Charity fundraiser una tarjeta de cupones de GoodRx.  - Si necesita que su receta se enve electrnicamente a una farmacia diferente, informe a nuestra oficina a travs de MyChart de [place] o por telfono llamando al [phone] y presione la opcin 4.

## 2022-10-25 ENCOUNTER — Encounter: Payer: Self-pay | Admitting: Dermatology

## 2022-10-28 ENCOUNTER — Telehealth: Payer: Self-pay | Admitting: Internal Medicine

## 2022-10-28 DIAGNOSIS — M7522 Bicipital tendinitis, left shoulder: Secondary | ICD-10-CM | POA: Diagnosis not present

## 2022-10-28 DIAGNOSIS — M24012 Loose body in left shoulder: Secondary | ICD-10-CM | POA: Diagnosis not present

## 2022-10-28 DIAGNOSIS — M25512 Pain in left shoulder: Secondary | ICD-10-CM | POA: Diagnosis not present

## 2022-10-28 DIAGNOSIS — M67922 Unspecified disorder of synovium and tendon, left upper arm: Secondary | ICD-10-CM | POA: Diagnosis not present

## 2022-10-28 DIAGNOSIS — M19012 Primary osteoarthritis, left shoulder: Secondary | ICD-10-CM | POA: Diagnosis not present

## 2022-10-28 NOTE — Telephone Encounter (Signed)
Prescription Request  10/28/2022  Is this a "Controlled Substance" medicine? No  LOV: 01/15/2022  What is the name of the medication or equipment? sildenafil (VIAGRA) 50 MG tablet   Have you contacted your pharmacy to request a refill? Yes  [facility] called to refill meds. Pharmacy stated they were told by pt that he's been taking 2 tablets daily, equalling '100mg'$  daily & it was discussed by Silvio Pate. Pharmacy is asking can the new prescription dosage be increased to '100mg'$  instead of refilling the '50mg'$ ?   Which pharmacy would you like this sent to?  [facility], [address] [place] [address] Phone: [phone] Fax: [phone]    Patient notified that their request is being sent to the clinical staff for review and that they should receive a response within 2 business days.   Please advise at Mobile [phone] (mobile)

## 2022-10-29 MED ORDER — SILDENAFIL CITRATE 100 MG PO TABS: 100.0000 mg | ORAL_TABLET | Freq: Every day | ORAL | 11 refills | Status: DC | PRN

## 2022-10-29 NOTE — Addendum Note (Signed)
Addended by: Pilar Grammes on: 10/29/2022 08:40 AM   Modules accepted: Orders

## 2022-10-29 NOTE — Telephone Encounter (Signed)
New rx for Viagra 100 mg sent to pharmacy. I may have to call it if their system is still down. I will keep this open to make sure it went through before I contact pt.

## 2022-10-30 DIAGNOSIS — M67922 Unspecified disorder of synovium and tendon, left upper arm: Secondary | ICD-10-CM | POA: Diagnosis not present

## 2022-10-30 DIAGNOSIS — M7522 Bicipital tendinitis, left shoulder: Secondary | ICD-10-CM | POA: Diagnosis not present

## 2022-10-30 DIAGNOSIS — M25512 Pain in left shoulder: Secondary | ICD-10-CM | POA: Diagnosis not present

## 2022-10-30 DIAGNOSIS — M24012 Loose body in left shoulder: Secondary | ICD-10-CM | POA: Diagnosis not present

## 2022-10-30 DIAGNOSIS — M19012 Primary osteoarthritis, left shoulder: Secondary | ICD-10-CM | POA: Diagnosis not present

## 2022-11-04 DIAGNOSIS — M7522 Bicipital tendinitis, left shoulder: Secondary | ICD-10-CM | POA: Diagnosis not present

## 2022-11-04 DIAGNOSIS — M67922 Unspecified disorder of synovium and tendon, left upper arm: Secondary | ICD-10-CM | POA: Diagnosis not present

## 2022-11-04 DIAGNOSIS — M25512 Pain in left shoulder: Secondary | ICD-10-CM | POA: Diagnosis not present

## 2022-11-04 DIAGNOSIS — M19012 Primary osteoarthritis, left shoulder: Secondary | ICD-10-CM | POA: Diagnosis not present

## 2022-11-04 DIAGNOSIS — M24012 Loose body in left shoulder: Secondary | ICD-10-CM | POA: Diagnosis not present

## 2022-11-06 DIAGNOSIS — M24012 Loose body in left shoulder: Secondary | ICD-10-CM | POA: Diagnosis not present

## 2022-11-06 DIAGNOSIS — M67922 Unspecified disorder of synovium and tendon, left upper arm: Secondary | ICD-10-CM | POA: Diagnosis not present

## 2022-11-06 DIAGNOSIS — M25512 Pain in left shoulder: Secondary | ICD-10-CM | POA: Diagnosis not present

## 2022-11-06 DIAGNOSIS — M7522 Bicipital tendinitis, left shoulder: Secondary | ICD-10-CM | POA: Diagnosis not present

## 2022-11-06 DIAGNOSIS — M19012 Primary osteoarthritis, left shoulder: Secondary | ICD-10-CM | POA: Diagnosis not present

## 2022-11-11 DIAGNOSIS — M67922 Unspecified disorder of synovium and tendon, left upper arm: Secondary | ICD-10-CM | POA: Diagnosis not present

## 2022-11-11 DIAGNOSIS — M25512 Pain in left shoulder: Secondary | ICD-10-CM | POA: Diagnosis not present

## 2022-11-11 DIAGNOSIS — M19012 Primary osteoarthritis, left shoulder: Secondary | ICD-10-CM | POA: Diagnosis not present

## 2022-11-11 DIAGNOSIS — M24012 Loose body in left shoulder: Secondary | ICD-10-CM | POA: Diagnosis not present

## 2022-11-11 DIAGNOSIS — M7522 Bicipital tendinitis, left shoulder: Secondary | ICD-10-CM | POA: Diagnosis not present

## 2022-11-13 DIAGNOSIS — M67922 Unspecified disorder of synovium and tendon, left upper arm: Secondary | ICD-10-CM | POA: Diagnosis not present

## 2022-11-13 DIAGNOSIS — M24012 Loose body in left shoulder: Secondary | ICD-10-CM | POA: Diagnosis not present

## 2022-11-13 DIAGNOSIS — M7522 Bicipital tendinitis, left shoulder: Secondary | ICD-10-CM | POA: Diagnosis not present

## 2022-11-13 DIAGNOSIS — M19012 Primary osteoarthritis, left shoulder: Secondary | ICD-10-CM | POA: Diagnosis not present

## 2022-11-13 DIAGNOSIS — M25512 Pain in left shoulder: Secondary | ICD-10-CM | POA: Diagnosis not present

## 2022-11-18 DIAGNOSIS — M24012 Loose body in left shoulder: Secondary | ICD-10-CM | POA: Diagnosis not present

## 2022-11-18 DIAGNOSIS — M25512 Pain in left shoulder: Secondary | ICD-10-CM | POA: Diagnosis not present

## 2022-11-18 DIAGNOSIS — M19012 Primary osteoarthritis, left shoulder: Secondary | ICD-10-CM | POA: Diagnosis not present

## 2022-11-18 DIAGNOSIS — M7522 Bicipital tendinitis, left shoulder: Secondary | ICD-10-CM | POA: Diagnosis not present

## 2022-11-18 DIAGNOSIS — M67922 Unspecified disorder of synovium and tendon, left upper arm: Secondary | ICD-10-CM | POA: Diagnosis not present

## 2022-11-20 DIAGNOSIS — M24012 Loose body in left shoulder: Secondary | ICD-10-CM | POA: Diagnosis not present

## 2022-11-20 DIAGNOSIS — M25512 Pain in left shoulder: Secondary | ICD-10-CM | POA: Diagnosis not present

## 2022-11-20 DIAGNOSIS — M7522 Bicipital tendinitis, left shoulder: Secondary | ICD-10-CM | POA: Diagnosis not present

## 2022-11-20 DIAGNOSIS — M67922 Unspecified disorder of synovium and tendon, left upper arm: Secondary | ICD-10-CM | POA: Diagnosis not present

## 2022-11-20 DIAGNOSIS — M19012 Primary osteoarthritis, left shoulder: Secondary | ICD-10-CM | POA: Diagnosis not present

## 2022-11-25 DIAGNOSIS — M67922 Unspecified disorder of synovium and tendon, left upper arm: Secondary | ICD-10-CM | POA: Diagnosis not present

## 2022-11-25 DIAGNOSIS — M24012 Loose body in left shoulder: Secondary | ICD-10-CM | POA: Diagnosis not present

## 2022-11-25 DIAGNOSIS — M7522 Bicipital tendinitis, left shoulder: Secondary | ICD-10-CM | POA: Diagnosis not present

## 2022-11-25 DIAGNOSIS — M25512 Pain in left shoulder: Secondary | ICD-10-CM | POA: Diagnosis not present

## 2022-11-25 DIAGNOSIS — M19012 Primary osteoarthritis, left shoulder: Secondary | ICD-10-CM | POA: Diagnosis not present

## 2022-11-27 DIAGNOSIS — M24012 Loose body in left shoulder: Secondary | ICD-10-CM | POA: Diagnosis not present

## 2022-11-27 DIAGNOSIS — M67922 Unspecified disorder of synovium and tendon, left upper arm: Secondary | ICD-10-CM | POA: Diagnosis not present

## 2022-11-27 DIAGNOSIS — H43813 Vitreous degeneration, bilateral: Secondary | ICD-10-CM | POA: Diagnosis not present

## 2022-11-27 DIAGNOSIS — M25512 Pain in left shoulder: Secondary | ICD-10-CM | POA: Diagnosis not present

## 2022-11-27 DIAGNOSIS — M7522 Bicipital tendinitis, left shoulder: Secondary | ICD-10-CM | POA: Diagnosis not present

## 2022-11-27 DIAGNOSIS — M19012 Primary osteoarthritis, left shoulder: Secondary | ICD-10-CM | POA: Diagnosis not present

## 2022-12-02 DIAGNOSIS — M67922 Unspecified disorder of synovium and tendon, left upper arm: Secondary | ICD-10-CM | POA: Diagnosis not present

## 2022-12-02 DIAGNOSIS — M7522 Bicipital tendinitis, left shoulder: Secondary | ICD-10-CM | POA: Diagnosis not present

## 2022-12-02 DIAGNOSIS — M25512 Pain in left shoulder: Secondary | ICD-10-CM | POA: Diagnosis not present

## 2022-12-02 DIAGNOSIS — M24012 Loose body in left shoulder: Secondary | ICD-10-CM | POA: Diagnosis not present

## 2022-12-02 DIAGNOSIS — M19012 Primary osteoarthritis, left shoulder: Secondary | ICD-10-CM | POA: Diagnosis not present

## 2022-12-04 DIAGNOSIS — M24012 Loose body in left shoulder: Secondary | ICD-10-CM | POA: Diagnosis not present

## 2022-12-04 DIAGNOSIS — M19012 Primary osteoarthritis, left shoulder: Secondary | ICD-10-CM | POA: Diagnosis not present

## 2022-12-04 DIAGNOSIS — M25512 Pain in left shoulder: Secondary | ICD-10-CM | POA: Diagnosis not present

## 2022-12-04 DIAGNOSIS — M67922 Unspecified disorder of synovium and tendon, left upper arm: Secondary | ICD-10-CM | POA: Diagnosis not present

## 2022-12-04 DIAGNOSIS — M7522 Bicipital tendinitis, left shoulder: Secondary | ICD-10-CM | POA: Diagnosis not present

## 2022-12-09 DIAGNOSIS — M25512 Pain in left shoulder: Secondary | ICD-10-CM | POA: Diagnosis not present

## 2022-12-09 DIAGNOSIS — M67922 Unspecified disorder of synovium and tendon, left upper arm: Secondary | ICD-10-CM | POA: Diagnosis not present

## 2022-12-09 DIAGNOSIS — M24012 Loose body in left shoulder: Secondary | ICD-10-CM | POA: Diagnosis not present

## 2022-12-09 DIAGNOSIS — M7522 Bicipital tendinitis, left shoulder: Secondary | ICD-10-CM | POA: Diagnosis not present

## 2022-12-09 DIAGNOSIS — M19012 Primary osteoarthritis, left shoulder: Secondary | ICD-10-CM | POA: Diagnosis not present

## 2022-12-09 DIAGNOSIS — Z23 Encounter for immunization: Secondary | ICD-10-CM | POA: Diagnosis not present

## 2022-12-11 DIAGNOSIS — M25512 Pain in left shoulder: Secondary | ICD-10-CM | POA: Diagnosis not present

## 2022-12-11 DIAGNOSIS — M19012 Primary osteoarthritis, left shoulder: Secondary | ICD-10-CM | POA: Diagnosis not present

## 2022-12-11 DIAGNOSIS — M7522 Bicipital tendinitis, left shoulder: Secondary | ICD-10-CM | POA: Diagnosis not present

## 2022-12-11 DIAGNOSIS — M24012 Loose body in left shoulder: Secondary | ICD-10-CM | POA: Diagnosis not present

## 2022-12-11 DIAGNOSIS — M67922 Unspecified disorder of synovium and tendon, left upper arm: Secondary | ICD-10-CM | POA: Diagnosis not present

## 2022-12-12 ENCOUNTER — Ambulatory Visit (INDEPENDENT_AMBULATORY_CARE_PROVIDER_SITE_OTHER): Payer: Medicare Other

## 2022-12-12 DIAGNOSIS — B351 Tinea unguium: Secondary | ICD-10-CM

## 2022-12-12 DIAGNOSIS — M79676 Pain in unspecified toe(s): Secondary | ICD-10-CM

## 2022-12-12 NOTE — Progress Notes (Signed)
Patient presents today for the 7th  laser treatment. 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    All other systems are negative.  Nails were filed thin. Laser therapy was administered to 1-5 toenails Bilaterally and patient tolerated the treatment well. All safety precautions were in place.   Single laser pass was done on non-affected nails.   Follow up in 3 month for laser maintenance

## 2022-12-16 DIAGNOSIS — M25512 Pain in left shoulder: Secondary | ICD-10-CM | POA: Diagnosis not present

## 2022-12-16 DIAGNOSIS — M24012 Loose body in left shoulder: Secondary | ICD-10-CM | POA: Diagnosis not present

## 2022-12-16 DIAGNOSIS — M19012 Primary osteoarthritis, left shoulder: Secondary | ICD-10-CM | POA: Diagnosis not present

## 2022-12-16 DIAGNOSIS — M7522 Bicipital tendinitis, left shoulder: Secondary | ICD-10-CM | POA: Diagnosis not present

## 2022-12-16 DIAGNOSIS — M67922 Unspecified disorder of synovium and tendon, left upper arm: Secondary | ICD-10-CM | POA: Diagnosis not present

## 2022-12-18 DIAGNOSIS — M25512 Pain in left shoulder: Secondary | ICD-10-CM | POA: Diagnosis not present

## 2022-12-18 DIAGNOSIS — M7522 Bicipital tendinitis, left shoulder: Secondary | ICD-10-CM | POA: Diagnosis not present

## 2022-12-18 DIAGNOSIS — M19012 Primary osteoarthritis, left shoulder: Secondary | ICD-10-CM | POA: Diagnosis not present

## 2022-12-18 DIAGNOSIS — M24012 Loose body in left shoulder: Secondary | ICD-10-CM | POA: Diagnosis not present

## 2022-12-18 DIAGNOSIS — M67922 Unspecified disorder of synovium and tendon, left upper arm: Secondary | ICD-10-CM | POA: Diagnosis not present

## 2022-12-23 DIAGNOSIS — M25512 Pain in left shoulder: Secondary | ICD-10-CM | POA: Diagnosis not present

## 2022-12-23 DIAGNOSIS — M24012 Loose body in left shoulder: Secondary | ICD-10-CM | POA: Diagnosis not present

## 2022-12-23 DIAGNOSIS — M19012 Primary osteoarthritis, left shoulder: Secondary | ICD-10-CM | POA: Diagnosis not present

## 2022-12-23 DIAGNOSIS — M7522 Bicipital tendinitis, left shoulder: Secondary | ICD-10-CM | POA: Diagnosis not present

## 2022-12-23 DIAGNOSIS — M67922 Unspecified disorder of synovium and tendon, left upper arm: Secondary | ICD-10-CM | POA: Diagnosis not present

## 2022-12-25 ENCOUNTER — Telehealth: Payer: Self-pay

## 2022-12-25 DIAGNOSIS — M7522 Bicipital tendinitis, left shoulder: Secondary | ICD-10-CM | POA: Diagnosis not present

## 2022-12-25 DIAGNOSIS — M19012 Primary osteoarthritis, left shoulder: Secondary | ICD-10-CM | POA: Diagnosis not present

## 2022-12-25 DIAGNOSIS — M25512 Pain in left shoulder: Secondary | ICD-10-CM | POA: Diagnosis not present

## 2022-12-25 DIAGNOSIS — M24012 Loose body in left shoulder: Secondary | ICD-10-CM | POA: Diagnosis not present

## 2022-12-25 DIAGNOSIS — M67922 Unspecified disorder of synovium and tendon, left upper arm: Secondary | ICD-10-CM | POA: Diagnosis not present

## 2022-12-25 NOTE — Telephone Encounter (Signed)
Patient calling to get Dr Onnie Boer thoughts on  over the counter Glabrous Skin Epilator hair follicle removal for his girlfriend Raynelle Fanning

## 2022-12-25 NOTE — Telephone Encounter (Signed)
For facial hair removal, I do not like epilators quite as much as trimmers because they are more likely to cause acne-like bumps, though I do not have experience with that particular one. Epilators do have the advantage of keeping the hair gone longer.   I typically suggest the Finishing Touch Lumina Painless Hair Remover trimmer. Thank you!

## 2022-12-30 DIAGNOSIS — M25512 Pain in left shoulder: Secondary | ICD-10-CM | POA: Diagnosis not present

## 2022-12-30 DIAGNOSIS — M19012 Primary osteoarthritis, left shoulder: Secondary | ICD-10-CM | POA: Diagnosis not present

## 2022-12-30 DIAGNOSIS — M67922 Unspecified disorder of synovium and tendon, left upper arm: Secondary | ICD-10-CM | POA: Diagnosis not present

## 2022-12-30 DIAGNOSIS — M7522 Bicipital tendinitis, left shoulder: Secondary | ICD-10-CM | POA: Diagnosis not present

## 2022-12-30 DIAGNOSIS — M24012 Loose body in left shoulder: Secondary | ICD-10-CM | POA: Diagnosis not present

## 2022-12-30 NOTE — Telephone Encounter (Signed)
Called patient. LMOM: For facial hair removal, I do not like epilators quite as much as trimmers because they are more likely to cause acne-like bumps, though I do not have experience with that particular one. Epilators do have the advantage of keeping the hair gone longer.   I typically suggest the Finishing Touch Lumina Painless Hair Remover trimmer.  C/B if any questions.

## 2022-12-31 ENCOUNTER — Encounter: Payer: Medicare Other | Admitting: Pharmacist

## 2022-12-31 ENCOUNTER — Telehealth: Payer: Self-pay

## 2022-12-31 NOTE — Progress Notes (Unsigned)
Care Management & Coordination Services Pharmacy Team  Reason for Encounter: Appointment Reminder  Contacted patient to confirm telephone appointment with Al Corpus, PharmD on 01/05/2023 at 3:45.  {US HC Outreach:28874}  Do you have any problems getting your medications? {yes/no:20286} If yes what types of problems are you experiencing? {Problems:27223}  What is your top health concern you would like to discuss at your upcoming visit?   Have you seen any other providers since your last visit with PCP? Yes  Star Rating Drugs:  Medication:  Last Fill: Day Supply Rosuvastatin 5 mg 12/22/2022 90  Care Gaps: Annual wellness visit in last year? Yes 01/15/2022  Al Corpus, PharmD notified  Claudina Lick, RMA Clinical Pharmacy Assistant [phone]

## 2023-01-05 ENCOUNTER — Ambulatory Visit: Payer: Medicare Other | Admitting: Pharmacist

## 2023-01-05 ENCOUNTER — Encounter: Payer: Medicare Other | Admitting: Pharmacist

## 2023-01-05 ENCOUNTER — Other Ambulatory Visit: Payer: Self-pay | Admitting: Internal Medicine

## 2023-01-05 DIAGNOSIS — M1990 Unspecified osteoarthritis, unspecified site: Secondary | ICD-10-CM

## 2023-01-05 NOTE — Telephone Encounter (Signed)
Patient has standing meeting at 3:45 and needs to reschedule.  This is his 1-year check-in call with PharmD, if he has no issues with medication he does not need to reschedule.

## 2023-01-05 NOTE — Progress Notes (Signed)
Care Management & Coordination Services Pharmacy Note  01/05/2023 Name:  Kevin Terrell MRN:  [id number] DOB:  [birth year]  Summary: F/U visit -CAD: pt had normal Myoview earlier this year, he continues on statin and aspirin -Pain: pt has chronic shoulder pain, follows with ortho. He tries to avoid NSAIDs but will use meloxicam occasionally for more severe pain; pt is doing Tai Chi to help with movement/balance  Recommendations/Changes made from today's visit: -No med changes  Follow up plan: -Health Concierge will call patient 6 months for general update -Pharmacist follow up PRN -PCP 01/20/23 (CPE)    Subjective: Kevin Terrell is an 82 y.o. year old male who is a primary patient of Karie Schwalbe, MD.  The care coordination team was consulted for assistance with disease management and care coordination needs.    Engaged with patient by telephone for follow up visit.  Recent office visits: 01/15/22 Dr Alphonsus Sias OV: annual - repeat CT chest July for hx lung nodule.  Recent consult visits: 09/05/22 Dr Landry Mellow (Ortho): shoulder pain  09/15/22 Myoview - normal scan 09/05/22 Coronary CT: Ca score 1031, 71st percentile. Proceed with exercise myoview. Add aspirin 81 mg. 09/04/22 Dr End (Cardiology): CAD - obtain coronary ca score.  Hospital visits: 04/2022 - cataract surgery   Objective:  Lab Results  Component Value Date   CREATININE 0.87 01/15/2022   BUN 15 01/15/2022   GFR 82.02 01/15/2022   GFRNONAA >60 10/18/2021   NA 139 01/15/2022   K 4.5 01/15/2022   CALCIUM 9.4 01/15/2022   CO2 31 01/15/2022   GLUCOSE 98 01/15/2022    Lab Results  Component Value Date/Time   GFR 82.02 01/15/2022 12:36 PM   GFR 69.93 12/13/2019 02:01 PM    Last diabetic Eye exam: No results found for: "HMDIABEYEEXA"  Last diabetic Foot exam: No results found for: "HMDIABFOOTEX"   Lab Results  Component Value Date   CHOL 123 01/15/2022   HDL 46.60 01/15/2022   LDLCALC 61 01/15/2022   TRIG  78.0 01/15/2022   CHOLHDL 3 01/15/2022       Latest Ref Rng & Units 01/15/2022   12:36 PM 10/18/2021    9:01 AM 07/17/2021    9:28 AM  Hepatic Function  Total Protein 6.0 - 8.3 g/dL 7.3  6.8    Albumin 3.5 - 5.2 g/dL 4.3  3.7    AST 0 - 37 U/L 18  18    ALT 0 - 53 U/L 16  16  19    Alk Phosphatase 39 - 117 U/L 59  61    Total Bilirubin 0.2 - 1.2 mg/dL 0.4  <2.1      Lab Results  Component Value Date/Time   TSH 1.74 02/21/2022 02:43 PM   TSH 1.55 10/21/2019 10:13 AM   FREET4 1.0 02/21/2022 02:43 PM       Latest Ref Rng & Units 01/15/2022   12:36 PM 10/18/2021    9:01 AM 04/15/2021    7:54 AM  CBC  WBC 4.0 - 10.5 K/uL 4.8  7.8  5.3   Hemoglobin 13.0 - 17.0 g/dL 30.8  65.7  84.6   Hematocrit 39.0 - 52.0 % 39.8  40.1  39.9   Platelets 150.0 - 400.0 K/uL 174.0  168  175     Lab Results  Component Value Date/Time   VD25OH 47.41 01/15/2022 12:36 PM   VITAMINB12 893 01/15/2022 12:36 PM    Clinical ASCVD: Yes  The ASCVD Risk score (Arnett DK, et al., 2019) failed  to calculate for the following reasons:   The 2019 ASCVD risk score is only valid for ages 46 to 56        01/15/2022   12:21 PM 01/15/2022   11:36 AM 01/10/2021    2:35 PM  Depression screen PHQ 2/9  Decreased Interest 0 0 0  Down, Depressed, Hopeless 0 0 0  PHQ - 2 Score 0 0 0     Social History   Tobacco Use  Smoking Status Never  Smokeless Tobacco Never   BP Readings from Last 3 Encounters:  10/23/22 122/66  09/04/22 120/80  04/22/22 98/62   Pulse Readings from Last 3 Encounters:  10/23/22 (!) 56  09/04/22 78  04/22/22 66   Wt Readings from Last 3 Encounters:  09/04/22 189 lb (85.7 kg)  04/22/22 189 lb 4 oz (85.8 kg)  04/15/22 184 lb (83.5 kg)   BMI Readings from Last 3 Encounters:  09/04/22 24.94 kg/m  04/22/22 24.97 kg/m  04/15/22 24.28 kg/m    Allergies  Allergen Reactions   Compazine [Prochlorperazine Edisylate]     muscle spasmus    Okra Hives   Other     Okra - hives    Phenylbutazone Other (See Comments)   Augmentin [Amoxicillin-Pot Clavulanate] Diarrhea    Tolerates Amoxicillin well    Medications Reviewed Today     Reviewed by Kathyrn Sheriff, RPH (Pharmacist) on 01/05/23 at 1143  Med List Status: <None>   Medication Order Taking? Sig Documenting Provider Last Dose Status Informant  albuterol (VENTOLIN HFA) 108 (90 Base) MCG/ACT inhaler [id number] Yes Inhale 1-2 puffs into the lungs every 6 (six) hours as needed. Margaretann Loveless, [place] Taking Active   aspirin EC 81 MG tablet [id number] Yes Take 1 tablet (81 mg total) by mouth daily. Swallow whole. Yvonne Kendall, MD Taking Active   Patient not taking:  Discontinued 01/05/23 1143 (Completed Course)   cetirizine (ZYRTEC) 10 MG tablet [id number] Yes Take 10 mg by mouth daily. [provider] Taking Active EMS/Transport Team  cholecalciferol (VITAMIN D3) 25 MCG (1000 UT) tablet [id number] Yes Take 1,000 Units by mouth daily. [provider] Taking Active EMS/Transport Team  diclofenac Sodium (VOLTAREN) 1 % GEL [id number] Yes APPLY 4 GM TO BOTH KNEES 4 TIMES DAILY AS NEEDED Karie Schwalbe, MD Taking Active   famotidine (PEPCID) 20 MG tablet [id number] Yes TAKE 1 TABLET BY MOUTH EVERY NIGHT AT BEDTIME Karie Schwalbe, MD Taking Active   fexofenadine (ALLEGRA) 180 MG tablet [id number] Yes Take 180 mg by mouth daily. [provider] Taking Active   fluticasone (FLONASE) 50 MCG/ACT nasal spray [id number] Yes Place 1 spray into both nostrils 2 (two) times daily. [provider] Taking Active EMS/Transport Team  gabapentin (NEURONTIN) 300 MG capsule [id number] Yes TAKE 1 CAPSULE BY MOUTH TWICE DAILY Karie Schwalbe, MD Taking Active   glucosamine-chondroitin 500-400 MG tablet [id number] Yes Take 1 tablet by mouth 3 (three) times daily. [provider] Taking Active   ketoconazole (NIZORAL) 2 % cream [id number] Yes APPLY A SMALL AMOUNT TO AFFECTED AREA TWICE DAILY  Moye, [place], MD Taking Active   levothyroxine (SYNTHROID) 50 MCG tablet [id number] Yes TAKE 1 TABLET BY MOUTH ONCE A DAY BEFOREBREAKFAST. Karie Schwalbe, MD Taking Active   Liniments (DEEP BLUE RELIEF) GEL [id number] Yes Apply topically. [provider] Taking Active   meloxicam (MOBIC) 15 MG tablet [id number] Yes Take 1 tablet (15 mg total) by mouth daily. As needed Karie Schwalbe, MD  Taking Active   montelukast (SINGULAIR) 10 MG tablet [id number] Yes TAKE 1 TABLET BY MOUTH EVERY NIGHT AT BEDTIME Worthy Rancher B, FNP Taking Active   Multiple Vitamin (MULTIVITAMIN WITH MINERALS) TABS tablet [id number] Yes Take 1 tablet by mouth daily. [provider] Taking Active EMS/Transport Team  Probiotic Product (PROBIOTIC PO) [id number] Yes Take 1 capsule by mouth 2 (two) times daily. [provider] Taking Active EMS/Transport Team  rosuvastatin (CRESTOR) 5 MG tablet [id number] Yes Take 1 tablet (5 mg total) by mouth daily. End, Cristal Deer, MD Taking Active   sildenafil (VIAGRA) 100 MG tablet [id number] Yes Take 1 tablet (100 mg total) by mouth daily as needed for erectile dysfunction. Karie Schwalbe, MD Taking Active   triamcinolone ointment (KENALOG) 0.1 % [id number] Yes APPLY TO HANDS AT BEDTIME AND COVER WITHGLOVES AS NEEDED FOR FLARES. AVOID FACE/GROIN/AXILLA. Willeen Niece, MD Taking Active             SDOH:  (Social Determinants of Health) assessments and interventions performed: No SDOH Interventions    Flowsheet Row Chronic Care Management from 12/31/2021 in [facility] [place]  SDOH Interventions   Food Insecurity Interventions Intervention Not Indicated  Financial Strain Interventions Intervention Not Indicated       Medication Assistance: None required.  Patient affirms current coverage meets needs.  Medication Access: Within the past 30 days, how often has patient missed a dose of medication? 0 Is a pillbox or other method  used to improve adherence? Yes  Factors that may affect medication adherence? no barriers identified Are meds synced by current pharmacy? Yes  Are meds delivered by current pharmacy? Yes  Does patient experience delays in picking up medications due to transportation concerns? No   [facility] Services Reviewed: Is patient disadvantaged to use [facility]?: Yes  Current Rx insurance plan: [employer] Name and location of Current pharmacy:  [facility] - [place], [place] - [address] [place] [address] Phone: [phone] Fax: [phone]  [facility] services reviewed with patient today?: No  Patient requests to transfer care to [facility] Pharmacy?: No  Reason patient declined to change pharmacies: Loyalty to other pharmacy/Patient preference  Compliance/Adherence/Medication fill history: Care Gaps: None  Star-Rating Drugs: Rosuvastatin - PDC 96%   ASSESSMENT / PLAN  Hyperlipidemia: (LDL goal < 100 - per cardiology) -Controlled - LDL 61 (12/2021) at goal, improved from 100 after statin -Aortic atherosclerosis, coronary artery calcification; ASCVD 22.5% -Coronary CT 09/2022 - Ca score > 1000. Subsequent myoview WNL. -Current treatment: Rosuvastatin 5 mg daily - Appropriate, Effective, Safe, Accessible Aspirin 81 mg daily - Appropriate, Effective, Safe, Accessible -Medications previously tried: rosuvastatin 10 mg  -Educated on Cholesterol goals; Benefits of statin for ASCVD risk reduction; -Recommended to continue current medication  Hypothyroidism (Goal: maintain TSH in goal range) -Controlled - TSH 1.7 (01/2022) at goal -Current treatment  Levothyroxine 50 mcg daily - Appropriate, Effective, Safe, Accessible -Medications previously tried: n/a  -Discussed long half life of LT4 ~7 days;  -Recommended to continue current medication  Pain (Goal: manage symptoms) -Controlled - pt follows with ortho; he does Tai Chi to help with  movement/balance -Hx peripheral neuropathy (d/t chemo); osteoarthritis of shoulder -Current treatment  Gabapentin 300 mg BID - Appropriate, Effective, Safe, Accessible Voltaren gel once daily (knees) - Appropriate, Effective, Safe, Accessible Meloxicam 15 mg PRN - Appropriate, Effective, Query Safe Tylenol 1000 mg BID-TID - Appropriate, Effective, Safe, Accessible -Medications previously tried: n/a  -Discussed risks of chronic  NSAID use; it would be beneficial to reduce meloxicam use to lower risk for kidney, heart, GI complications -Recommend to continue current medication  GERD (Goal: manage symptoms) -Controlled - pt sometimes uses Pepcid Plus PRN -Current treatment  Famotidine 20 mg daily HS - Appropriate, Effective, Safe, Accessible -Medications previously tried: n/a  -Recommended to continue current medication  Allergic rhinitis (Goal: manage symptoms) -Controlled - pt wants to add Allegra on top of cetirizine seasonally - has discussed with PCP and likely OK -Current treatment  Cetirizine 10 mg daily PM - Appropriate, Effective, Safe, Accessible Fexofenadine 180 mg daily Prn - Appropriate, Effective, Safe, Accessible Montelukast 10 mg daily - Appropriate, Effective, Safe, Accessible Flonase (seasonally) - Appropriate, Effective, Safe, Accessible -Medications previously tried: n/a  -Discussed it is ok to try Allegra in addition to cetirizine; monitor for drowsiness/sedation  Health Maintenance -Vaccine gaps: None -Hx lung cancer - continue yearly LDCT.  -Augmentin causes diarrhea (even with BID probiotic)- Does OK with plain amoxicillin. -OTC/supplements: Deep Blue   Al Corpus, PharmD, Patsy [place], CPP Clinical Pharmacist Practitioner [facility] [phone]

## 2023-01-05 NOTE — Telephone Encounter (Cosign Needed)
Patient has been contacted; appointment was rescheduled from 3:45 today (01/05/2023) to 11:30.  Al Corpus, PharmD notified  Claudina Lick, [place] Clinical Pharmacy Assistant [phone]

## 2023-01-06 DIAGNOSIS — M25512 Pain in left shoulder: Secondary | ICD-10-CM | POA: Diagnosis not present

## 2023-01-06 DIAGNOSIS — M67922 Unspecified disorder of synovium and tendon, left upper arm: Secondary | ICD-10-CM | POA: Diagnosis not present

## 2023-01-06 DIAGNOSIS — M7522 Bicipital tendinitis, left shoulder: Secondary | ICD-10-CM | POA: Diagnosis not present

## 2023-01-06 DIAGNOSIS — M24012 Loose body in left shoulder: Secondary | ICD-10-CM | POA: Diagnosis not present

## 2023-01-06 DIAGNOSIS — M19012 Primary osteoarthritis, left shoulder: Secondary | ICD-10-CM | POA: Diagnosis not present

## 2023-01-08 ENCOUNTER — Other Ambulatory Visit: Payer: Self-pay | Admitting: Internal Medicine

## 2023-01-08 DIAGNOSIS — M25512 Pain in left shoulder: Secondary | ICD-10-CM | POA: Diagnosis not present

## 2023-01-08 DIAGNOSIS — M24012 Loose body in left shoulder: Secondary | ICD-10-CM | POA: Diagnosis not present

## 2023-01-08 DIAGNOSIS — M19012 Primary osteoarthritis, left shoulder: Secondary | ICD-10-CM | POA: Diagnosis not present

## 2023-01-08 DIAGNOSIS — M7522 Bicipital tendinitis, left shoulder: Secondary | ICD-10-CM | POA: Diagnosis not present

## 2023-01-08 DIAGNOSIS — E039 Hypothyroidism, unspecified: Secondary | ICD-10-CM

## 2023-01-08 DIAGNOSIS — M67922 Unspecified disorder of synovium and tendon, left upper arm: Secondary | ICD-10-CM | POA: Diagnosis not present

## 2023-01-14 DIAGNOSIS — M25512 Pain in left shoulder: Secondary | ICD-10-CM | POA: Diagnosis not present

## 2023-01-14 DIAGNOSIS — M19012 Primary osteoarthritis, left shoulder: Secondary | ICD-10-CM | POA: Diagnosis not present

## 2023-01-14 DIAGNOSIS — M67922 Unspecified disorder of synovium and tendon, left upper arm: Secondary | ICD-10-CM | POA: Diagnosis not present

## 2023-01-14 DIAGNOSIS — M7522 Bicipital tendinitis, left shoulder: Secondary | ICD-10-CM | POA: Diagnosis not present

## 2023-01-14 DIAGNOSIS — M24012 Loose body in left shoulder: Secondary | ICD-10-CM | POA: Diagnosis not present

## 2023-01-16 DIAGNOSIS — M19012 Primary osteoarthritis, left shoulder: Secondary | ICD-10-CM | POA: Diagnosis not present

## 2023-01-16 DIAGNOSIS — M7522 Bicipital tendinitis, left shoulder: Secondary | ICD-10-CM | POA: Diagnosis not present

## 2023-01-16 DIAGNOSIS — M24012 Loose body in left shoulder: Secondary | ICD-10-CM | POA: Diagnosis not present

## 2023-01-16 DIAGNOSIS — G8929 Other chronic pain: Secondary | ICD-10-CM | POA: Diagnosis not present

## 2023-01-16 DIAGNOSIS — M25512 Pain in left shoulder: Secondary | ICD-10-CM | POA: Diagnosis not present

## 2023-01-16 DIAGNOSIS — M67922 Unspecified disorder of synovium and tendon, left upper arm: Secondary | ICD-10-CM | POA: Diagnosis not present

## 2023-01-20 ENCOUNTER — Encounter: Payer: Medicare Other | Admitting: Internal Medicine

## 2023-01-22 ENCOUNTER — Ambulatory Visit (INDEPENDENT_AMBULATORY_CARE_PROVIDER_SITE_OTHER): Payer: Medicare Other | Admitting: Dermatology

## 2023-01-22 VITALS — BP 100/62 | HR 66

## 2023-01-22 DIAGNOSIS — L821 Other seborrheic keratosis: Secondary | ICD-10-CM | POA: Diagnosis not present

## 2023-01-22 DIAGNOSIS — L739 Follicular disorder, unspecified: Secondary | ICD-10-CM | POA: Diagnosis not present

## 2023-01-22 DIAGNOSIS — M7522 Bicipital tendinitis, left shoulder: Secondary | ICD-10-CM | POA: Diagnosis not present

## 2023-01-22 DIAGNOSIS — X32XXXA Exposure to sunlight, initial encounter: Secondary | ICD-10-CM

## 2023-01-22 DIAGNOSIS — L820 Inflamed seborrheic keratosis: Secondary | ICD-10-CM

## 2023-01-22 DIAGNOSIS — D1801 Hemangioma of skin and subcutaneous tissue: Secondary | ICD-10-CM | POA: Diagnosis not present

## 2023-01-22 DIAGNOSIS — M24012 Loose body in left shoulder: Secondary | ICD-10-CM | POA: Diagnosis not present

## 2023-01-22 DIAGNOSIS — L570 Actinic keratosis: Secondary | ICD-10-CM

## 2023-01-22 DIAGNOSIS — M67922 Unspecified disorder of synovium and tendon, left upper arm: Secondary | ICD-10-CM | POA: Diagnosis not present

## 2023-01-22 DIAGNOSIS — M19012 Primary osteoarthritis, left shoulder: Secondary | ICD-10-CM | POA: Diagnosis not present

## 2023-01-22 DIAGNOSIS — L578 Other skin changes due to chronic exposure to nonionizing radiation: Secondary | ICD-10-CM

## 2023-01-22 DIAGNOSIS — W908XXA Exposure to other nonionizing radiation, initial encounter: Secondary | ICD-10-CM

## 2023-01-22 DIAGNOSIS — M25512 Pain in left shoulder: Secondary | ICD-10-CM | POA: Diagnosis not present

## 2023-01-22 NOTE — Patient Instructions (Signed)
Due to recent changes in healthcare laws, you may see results of your pathology and/or laboratory studies on MyChart before the doctors have had a chance to review them. We understand that in some cases there may be results that are confusing or concerning to you. Please understand that not all results are received at the same time and often the doctors may need to interpret multiple results in order to provide you with the best plan of care or course of treatment. Therefore, we ask that you please give us 2 business days to thoroughly review all your results before contacting the office for clarification. Should we see a critical lab result, you will be contacted sooner.   If You Need Anything After Your Visit  If you have any questions or concerns for your doctor, please call our main line at [phone] and press option 4 to reach your doctor's medical assistant. If no one answers, please leave a voicemail as directed and we will return your call as soon as possible. Messages left after 4 pm will be answered the following business day.   You may also send us a message via MyChart. We typically respond to MyChart messages within 1-2 business days.  For prescription refills, please ask your pharmacy to contact our office. Our fax number is [phone].  If you have an urgent issue when the clinic is closed that cannot wait until the next business day, you can page your doctor at the number below.    Please note that while we do our best to be available for urgent issues outside of office hours, we are not available 24/7.   If you have an urgent issue and are unable to reach us, you may choose to seek medical care at your doctor's office, retail clinic, urgent care center, or emergency room.  If you have a medical emergency, please immediately call 911 or go to the emergency department.  Pager Numbers  - Dr. Kowalski: [phone]  - Dr. Moye: [phone]  - Dr. Stewart:  [phone]  In the event of inclement weather, please call our main line at [phone] for an update on the status of any delays or closures.  Dermatology Medication Tips: Please keep the boxes that topical medications come in in order to help keep track of the instructions about where and how to use these. Pharmacies typically print the medication instructions only on the boxes and not directly on the medication tubes.   If your medication is too expensive, please contact our office at [phone] option 4 or send us a message through MyChart.   We are unable to tell what your co-pay for medications will be in advance as this is different depending on your insurance coverage. However, we may be able to find a substitute medication at lower cost or fill out paperwork to get insurance to cover a needed medication.   If a prior authorization is required to get your medication covered by your insurance company, please allow us 1-2 business days to complete this process.  Drug prices often vary depending on where the prescription is filled and some pharmacies may offer cheaper prices.  The website www.goodrx.com contains coupons for medications through different pharmacies. The prices here do not account for what the cost may be with help from insurance (it may be cheaper with your insurance), but the website can give you the price if you did not use any insurance.  - You can print the associated coupon and take it with   your prescription to the pharmacy.  - You may also stop by our office during regular business hours and pick up a GoodRx coupon card.  - If you need your prescription sent electronically to a different pharmacy, notify our office through [facility] MyChart or by phone at [phone] option 4.     Si Usted Necesita Algo Despus de Su Visita  Tambin puede enviarnos un mensaje a travs de MyChart. Por lo general respondemos a los mensajes de MyChart en el transcurso de 1 a 2  das hbiles.  Para renovar recetas, por favor pida a su farmacia que se ponga en contacto con nuestra oficina. Nuestro nmero de fax es el [phone].  Si tiene un asunto urgente cuando la clnica est cerrada y que no puede esperar hasta el siguiente da hbil, puede llamar/localizar a su doctor(a) al nmero que aparece a continuacin.   Por favor, tenga en cuenta que aunque hacemos todo lo posible para estar disponibles para asuntos urgentes fuera del horario de oficina, no estamos disponibles las 24 horas del da, los 7 das de la semana.   Si tiene un problema urgente y no puede comunicarse con nosotros, puede optar por buscar atencin mdica  en el consultorio de su doctor(a), en una clnica privada, en un centro de atencin urgente o en una sala de emergencias.  Si tiene una emergencia mdica, por favor llame inmediatamente al 911 o vaya a la sala de emergencias.  Nmeros de bper  - Dr. Kowalski: [phone]  - Dra. Moye: [phone]  - Dra. Stewart: [phone]  En caso de inclemencias del tiempo, por favor llame a nuestra lnea principal al [phone] para una actualizacin sobre el estado de cualquier retraso o cierre.  Consejos para la medicacin en dermatologa: Por favor, guarde las cajas en las que vienen los medicamentos de uso tpico para ayudarle a seguir las instrucciones sobre dnde y cmo usarlos. Las farmacias generalmente imprimen las instrucciones del medicamento slo en las cajas y no directamente en los tubos del medicamento.   Si su medicamento es muy caro, por favor, pngase en contacto con nuestra oficina llamando al [phone] y presione la opcin 4 o envenos un mensaje a travs de MyChart.   No podemos decirle cul ser su copago por los medicamentos por adelantado ya que esto es diferente dependiendo de la cobertura de su seguro. Sin embargo, es posible que podamos encontrar un medicamento sustituto a menor costo o llenar un formulario para que el  seguro cubra el medicamento que se considera necesario.   Si se requiere una autorizacin previa para que su compaa de seguros cubra su medicamento, por favor permtanos de 1 a 2 das hbiles para completar este proceso.  Los precios de los medicamentos varan con frecuencia dependiendo del lugar de dnde se surte la receta y alguna farmacias pueden ofrecer precios ms baratos.  El sitio web www.goodrx.com tiene cupones para medicamentos de diferentes farmacias. Los precios aqu no tienen en cuenta lo que podra costar con la ayuda del seguro (puede ser ms barato con su seguro), pero el sitio web puede darle el precio si no utiliz ningn seguro.  - Puede imprimir el cupn correspondiente y llevarlo con su receta a la farmacia.  - Tambin puede pasar por nuestra oficina durante el horario de atencin regular y recoger una tarjeta de cupones de GoodRx.  - Si necesita que su receta se enve electrnicamente a una farmacia diferente, informe a nuestra oficina a travs de MyChart de    o por telfono llamando al [phone] y presione la opcin 4.  

## 2023-01-22 NOTE — Progress Notes (Signed)
Follow-Up Visit   Subjective  Kevin Terrell is a 82 y.o. male who presents for the following: Recheck previously treated AK and ISKs. Pt has circled a few other skin lesions that he would like checked today.   The following portions of the chart were reviewed this encounter and updated as appropriate: medications, allergies, medical history  Review of Systems:  No other skin or systemic complaints except as noted in HPI or Assessment and Plan.  Objective  Well appearing patient in no apparent distress; mood and affect are within normal limits.   A focused examination was performed of the following areas:   Relevant exam findings are noted in the Assessment and Plan.  R thigh x 1, chest x 3, mid back x 1 (5) Erythematous stuck-on, waxy papule or plaque  R side burn x 1 Erythematous thin papules/macules with gritty scale.     Assessment & Plan   SEBORRHEIC KERATOSIS - Stuck-on, waxy, tan-brown papules and/or plaques  - Benign-appearing - Discussed benign etiology and prognosis. - Observe - Call for any changes  ACTINIC DAMAGE - chronic, 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  - Recommend staying in the shade or wearing long sleeves, sun glasses (UVA+UVB protection) and wide brim hats (4-inch brim around the entire circumference of the hat). - Call for new or changing lesions.  Inflamed seborrheic keratosis (5) R thigh x 1, chest x 3, mid back x 1  Symptomatic, irritating, patient would like treated.  Benign-appearing.  Call clinic for new or changing lesions.   Prior to procedure, discussed risks of blister formation, small wound, skin dyspigmentation, or rare scar following treatment. Recommend Vaseline ointment to treated areas while healing.   Destruction of  lesion - R thigh x 1, chest x 3, mid back x 1 Complexity: simple   Destruction method: cryotherapy   Informed consent: discussed and consent obtained   Timeout:  patient name, date of birth, surgical site, and procedure verified Lesion destroyed using liquid nitrogen: Yes   Region frozen until ice ball extended beyond lesion: Yes   Outcome: patient tolerated procedure well with no complications   Post-procedure details: wound care instructions given    AK (actinic keratosis) R side burn x 1  Actinic keratoses are precancerous spots that appear secondary to cumulative UV radiation exposure/sun exposure over time. They are chronic with expected duration over 1 year. A portion of actinic keratoses will progress to squamous cell carcinoma of the skin. It is not possible to reliably predict which spots will progress to skin cancer and so treatment is recommended to prevent development of skin cancer.  Recommend daily broad spectrum sunscreen SPF 30+ to sun-exposed areas, reapply every 2 hours as needed.  Recommend staying in the shade or wearing long sleeves, sun glasses (UVA+UVB protection) and wide brim hats (4-inch brim around the entire circumference of the hat). Call for new or changing lesions.  Prior to procedure, discussed risks of blister formation, small wound, skin dyspigmentation, or rare scar following cryotherapy. Recommend Vaseline ointment to treated areas while healing.   Destruction of lesion - R side burn x 1 Complexity: simple   Destruction method: cryotherapy   Informed consent: discussed and consent obtained   Timeout:  patient name, date of birth, surgical site, and procedure verified Lesion destroyed using liquid nitrogen: Yes  Region frozen until ice ball extended beyond lesion: Yes   Outcome: patient tolerated procedure well with no complications   Post-procedure details: wound care instructions given    FOLLICULITIS Exam: Perifollicular erythematous papules and  pustules  Treatment Plan: Benign, no tx needed.  HEMANGIOMA Exam: red papule(s) Discussed benign nature. Recommend observation. Call for changes.  Return in about 6 months (around 07/25/2023).  Maylene Roes, CMA, am acting as scribe for Darden Dates, MD .  Documentation: I have reviewed the above documentation for accuracy and completeness, and I agree with the above.  Darden Dates, MD

## 2023-01-27 DIAGNOSIS — M25512 Pain in left shoulder: Secondary | ICD-10-CM | POA: Diagnosis not present

## 2023-01-27 DIAGNOSIS — M67922 Unspecified disorder of synovium and tendon, left upper arm: Secondary | ICD-10-CM | POA: Diagnosis not present

## 2023-01-27 DIAGNOSIS — M7522 Bicipital tendinitis, left shoulder: Secondary | ICD-10-CM | POA: Diagnosis not present

## 2023-01-27 DIAGNOSIS — M19012 Primary osteoarthritis, left shoulder: Secondary | ICD-10-CM | POA: Diagnosis not present

## 2023-01-27 DIAGNOSIS — M24012 Loose body in left shoulder: Secondary | ICD-10-CM | POA: Diagnosis not present

## 2023-02-02 ENCOUNTER — Telehealth: Payer: Self-pay | Admitting: Podiatry

## 2023-02-02 NOTE — Telephone Encounter (Signed)
Pt called asking if you could give him a call about his toenails. He has been undergoing the  laser treatment.

## 2023-02-03 ENCOUNTER — Encounter: Payer: Self-pay | Admitting: Internal Medicine

## 2023-02-16 DIAGNOSIS — H0011 Chalazion right upper eyelid: Secondary | ICD-10-CM | POA: Diagnosis not present

## 2023-02-20 ENCOUNTER — Ambulatory Visit (INDEPENDENT_AMBULATORY_CARE_PROVIDER_SITE_OTHER): Payer: Medicare Other | Admitting: Internal Medicine

## 2023-02-20 ENCOUNTER — Encounter: Payer: Self-pay | Admitting: Internal Medicine

## 2023-02-20 VITALS — BP 110/80 | HR 84 | Temp 97.5°F | Ht 71.0 in | Wt 190.0 lb

## 2023-02-20 DIAGNOSIS — M159 Polyosteoarthritis, unspecified: Secondary | ICD-10-CM | POA: Diagnosis not present

## 2023-02-20 DIAGNOSIS — T451X5A Adverse effect of antineoplastic and immunosuppressive drugs, initial encounter: Secondary | ICD-10-CM

## 2023-02-20 DIAGNOSIS — Z0000 Encounter for general adult medical examination without abnormal findings: Secondary | ICD-10-CM | POA: Diagnosis not present

## 2023-02-20 DIAGNOSIS — E039 Hypothyroidism, unspecified: Secondary | ICD-10-CM

## 2023-02-20 DIAGNOSIS — I700 Atherosclerosis of aorta: Secondary | ICD-10-CM | POA: Diagnosis not present

## 2023-02-20 DIAGNOSIS — G620 Drug-induced polyneuropathy: Secondary | ICD-10-CM

## 2023-02-20 DIAGNOSIS — K219 Gastro-esophageal reflux disease without esophagitis: Secondary | ICD-10-CM | POA: Diagnosis not present

## 2023-02-20 LAB — COMPREHENSIVE METABOLIC PANEL
ALT: 19 U/L (ref 0–53)
AST: 19 U/L (ref 0–37)
Albumin: 4.4 g/dL (ref 3.5–5.2)
Alkaline Phosphatase: 49 U/L (ref 39–117)
BUN: 17 mg/dL (ref 6–23)
CO2: 32 mEq/L (ref 19–32)
Calcium: 9.3 mg/dL (ref 8.4–10.5)
Chloride: 98 mEq/L (ref 96–112)
Creatinine, Ser: 0.9 mg/dL (ref 0.40–1.50)
GFR: 80.56 mL/min (ref >60.00)
Glucose, Bld: 90 mg/dL (ref 70–99)
Potassium: 4.2 mEq/L (ref 3.5–5.1)
Sodium: 137 mEq/L (ref 135–145)
Total Bilirubin: 0.7 mg/dL (ref 0.2–1.2)
Total Protein: 7.2 g/dL (ref 6.0–8.3)

## 2023-02-20 LAB — LIPID PANEL
Cholesterol: 141 mg/dL (ref 0–200)
HDL: 48.9 mg/dL (ref >39.00)
LDL Cholesterol: 78 mg/dL (ref 0–99)
NonHDL: 92.29
Total CHOL/HDL Ratio: 3
Triglycerides: 70 mg/dL (ref 0.0–149.0)
VLDL: 14 mg/dL (ref 0.0–40.0)

## 2023-02-20 LAB — CBC
HCT: 41.5 % (ref 39.0–52.0)
Hemoglobin: 13.6 g/dL (ref 13.0–17.0)
MCHC: 32.8 g/dL (ref 30.0–36.0)
MCV: 92.7 fl (ref 78.0–100.0)
Platelets: 169 10*3/uL (ref 150.0–400.0)
RBC: 4.48 Mil/uL (ref 4.22–5.81)
RDW: 13 % (ref 11.5–15.5)
WBC: 4.2 10*3/uL (ref 4.0–10.5)

## 2023-02-20 LAB — TSH: TSH: 2.77 u[IU]/mL (ref 0.35–5.50)

## 2023-02-20 LAB — T4, FREE: Free T4: 0.85 ng/dL (ref 0.60–1.60)

## 2023-02-20 NOTE — Assessment & Plan Note (Signed)
Working with ortho, acupuncture, chiropractor, etc Uses the meloxicam prn

## 2023-02-20 NOTE — Assessment & Plan Note (Signed)
Quiet on the famotidine 20mg  nightly

## 2023-02-20 NOTE — Progress Notes (Signed)
Hearing Screening - Comments:: Passed whisper test Vision Screening - Comments:: September 2023  

## 2023-02-20 NOTE — Assessment & Plan Note (Signed)
I have personally reviewed the Medicare Annual Wellness questionnaire and have noted 1. The patient's medical and social history 2. Their use of alcohol, tobacco or illicit drugs 3. Their current medications and supplements 4. The patient's functional ability including ADL's, fall risks, home safety risks and hearing or visual             impairment. 5. Diet and physical activities 6. Evidence for depression or mood disorders  The patients weight, height, BMI and visual acuity have been recorded in the chart I have made referrals, counseling and provided education to the patient based review of the above and I have provided the pt with a written personalized care plan for preventive services.  I have provided you with a copy of your personalized plan for preventive services. Please take the time to review along with your updated medication list.  Done with cancer screening Exercises regularly Flu/COVID in the fall Thinks he has RSV last year

## 2023-02-20 NOTE — Progress Notes (Signed)
Subjective:    Patient ID: Kevin Terrell, male    DOB: Jan 20, 1941, 82 y.o.   MRN: [id number]  HPI Here for Medicare wellness visit and follow up of chronic health conditions Reviewed advanced directives Reviewed other doctors---Dr Eugenie Birks, Dr Rolm Bookbinder (will be getting new one), Dr Jonny Ruiz [place]--chiropractor, Dr Thomasene Lot, Mr Peck--acupuncture, Dr Romona Curls Had cataracts removed--no other surgery or hospitalizations Vision is great since cataracts. Recent stye treated Hearing is fine Rare alcohol No tobacco Tries to walk daily. 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 Seems stable  Knee arthritis managed with diclofenac gel Left worse than right shoulder arthritis--uses diclofenac there too Cortisone injection in left--awaiting the gel Multiple PT visits for the shoulders Uses meloxicam only rarely Also getting acupuncture  Acid symptoms have been quiet on the famotidine No dysphagia  Known aortic atherosclerosis Is on statin  Energy levels are good Dry skin in winter--no other issues On thyroid med  Current Outpatient Medications on File Prior to Visit  Medication Sig Dispense Refill   albuterol (VENTOLIN HFA) 108 (90 Base) MCG/ACT inhaler Inhale 1-2 puffs into the lungs every 6 (six) hours as needed. 8 g 0   aspirin EC 81 MG tablet Take 1 tablet (81 mg total) by mouth daily. Swallow whole.     cetirizine (ZYRTEC) 10 MG tablet Take 10 mg by mouth daily.     cholecalciferol (VITAMIN D3) 25 MCG (1000 UT) tablet Take 1,000 Units by mouth daily.     diclofenac Sodium (VOLTAREN) 1 % GEL APPLY 4 GM TO BOTH KNEES 4 TIMES DAILY AS NEEDED 600 g 4   famotidine (PEPCID) 20 MG tablet TAKE 1 TABLET BY MOUTH EVERY NIGHT AT BEDTIME 90 tablet 3   fexofenadine (ALLEGRA) 180 MG tablet Take 180 mg by mouth daily.     fluticasone (FLONASE) 50 MCG/ACT nasal  spray Place 1 spray into both nostrils 2 (two) times daily.     gabapentin (NEURONTIN) 300 MG capsule TAKE 1 CAPSULE BY MOUTH TWICE DAILY 180 capsule 3   glucosamine-chondroitin 500-400 MG tablet Take 1 tablet by mouth 3 (three) times daily.     ketoconazole (NIZORAL) 2 % cream APPLY A SMALL AMOUNT TO AFFECTED AREA TWICE DAILY 60 g 3   levothyroxine (SYNTHROID) 50 MCG tablet TAKE ONE TABLET BY MOUTH ONCE A DAY BEFORE BREAKFAST. 90 tablet 0   Liniments (DEEP BLUE RELIEF) GEL Apply topically.     meloxicam (MOBIC) 15 MG tablet TAKE ONE TABLET BY MOUTH DAILY AS NEEDED 90 tablet 0   montelukast (SINGULAIR) 10 MG tablet TAKE 1 TABLET BY MOUTH EVERY NIGHT AT BEDTIME 90 tablet 3   Multiple Vitamin (MULTIVITAMIN WITH MINERALS) TABS tablet Take 1 tablet by mouth daily.     Probiotic Product (PROBIOTIC PO) Take 1 capsule by mouth 2 (two) times daily.     rosuvastatin (CRESTOR) 5 MG tablet Take 1 tablet (5 mg total) by mouth daily. 90 tablet 2   sildenafil (VIAGRA) 100 MG tablet Take 1 tablet (100 mg total) by mouth daily as needed for erectile dysfunction. 10 tablet 11   triamcinolone ointment (KENALOG) 0.1 % APPLY TO HANDS AT BEDTIME AND COVER WITHGLOVES AS NEEDED FOR FLARES. AVOID FACE/GROIN/AXILLA. 80 g 1   No current facility-administered medications on file prior to visit.    Allergies  Allergen Reactions   Compazine [Prochlorperazine Edisylate]     muscle spasmus    Okra  Hives   Other     Okra - hives   Phenylbutazone Other (See Comments)   Augmentin [Amoxicillin-Pot Clavulanate] Diarrhea    Tolerates Amoxicillin well    Past Medical History:  Diagnosis Date   Actinic keratosis    Arthritis    Chronic allergic rhinitis due to pollen    GERD (gastroesophageal reflux disease)    with atypical cough   Hypothyroidism    Lung cancer (HCC) 2009   chemo, NO radiation. 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,  excised 10/31/21    Past Surgical History:  Procedure Laterality Date   CATARACT EXTRACTION W/PHACO Left 04/01/2022   Procedure: CATARACT EXTRACTION PHACO AND INTRAOCULAR LENS PLACEMENT (IOC) LEFT VIVITY TORIC;  Surgeon: Galen Manila, MD;  Location: [facility];  Service: Ophthalmology;  Laterality: Left;  7.53 00:41.3   CATARACT EXTRACTION W/PHACO Right 04/15/2022   Procedure: CATARACT EXTRACTION PHACO AND INTRAOCULAR LENS PLACEMENT (IOC) RIGHT VIVITY TORIC;  Surgeon: Galen Manila, MD;  Location: [facility];  Service: Ophthalmology;  Laterality: Right;  5.35 00:36.3   CHOLECYSTECTOMY     COLONOSCOPY     COLONOSCOPY WITH PROPOFOL N/A 11/30/2019   Procedure: COLONOSCOPY WITH PROPOFOL;  Surgeon: Toney Reil, MD;  Location: [facility];  Service: Gastroenterology;  Laterality: N/A;   EYE SURGERY     vitreous detachment---laser   KNEE CARTILAGE SURGERY Right 1968   KNEE SURGERY     bilateral meniscal surgeries   LUNG LOBECTOMY     upper right lung 12/15/2007    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   UPPER GI ENDOSCOPY      Family History  Problem Relation Age of Onset   Cancer Mother    Alzheimer's disease Mother    Stroke Maternal Grandmother    Hypertension Father    Alzheimer's disease Father     Social History   Socioeconomic History   Marital status: Widowed    Spouse name: Not on file   Number of children: 3   Years of education: Not on file   Highest education level: Not on file  Occupational History   Occupation: International aid/development worker    Comment: retired  Tobacco Use   Smoking status: Never    Passive exposure: Never   Smokeless tobacco: Never  Vaping Use   Vaping Use: Never used  Substance and Sexual Activity   Alcohol use: Yes    Comment: Once glass of wine per month, none last 24hrs   Drug use: Never   Sexual activity: Yes  Other Topics Concern   Not on file  Social History Narrative   Married   Veterinarian   Wife died 03/17/2023    Has a daughter in [place]; son and daughter in [place]    6 grandchildren   [place] from [place]; spent some time in [place]; moved here 2019.    Lives at [facility]      Has advanced directives   Daughter Maralyn Sago is health care POA   Would accept resuscitation attempts   No feeding tube if cognitively unaware   Social Determinants of Health   Financial Resource Strain: Low Risk  (01/03/2022)   Overall Financial Resource Strain (CARDIA)    Difficulty of Paying Living Expenses: Not hard at all  Food Insecurity: No Food Insecurity (01/03/2022)   Hunger Vital Sign    Worried About Running Out of Food in the Last Year:  Never true    Ran Out of Food in the Last Year: Never true  Transportation Needs: No Transportation Needs (10/26/2018)   PRAPARE - Administrator, Civil Service (Medical): No    Lack of Transportation (Non-Medical): No  Physical Activity: Unknown (10/26/2018)   Exercise Vital Sign    Days of Exercise per Week: 0 days    Minutes of Exercise per Session: Not on file  Stress: No Stress Concern Present (10/26/2018)   [employer] of Occupational Health - Occupational Stress Questionnaire    Feeling of Stress : Not at all  Social Connections: Not on file  Intimate Partner Violence: Not At Risk (10/26/2018)   Humiliation, Afraid, Rape, and Kick questionnaire    Fear of Current or Ex-Partner: No    Emotionally Abused: No    Physically Abused: No    Sexually Abused: No   Review of Systems Appetite is great Weight is stable Sleeps well Wears seat belt Teeth are okay--keeps up with dentist No suspicious skin lesions now No chest pain or SOB No dizziness or syncope---just has some balance problems (neuropathy) Bowels move fine--no blood Voids okay--stream slow but adequate. Nocturia usually x 1    Objective:   Physical Exam Constitutional:      Appearance: Normal appearance.  HENT:     Mouth/Throat:     Pharynx: No oropharyngeal exudate or posterior  oropharyngeal erythema.  Eyes:     Conjunctiva/sclera: Conjunctivae normal.     Pupils: Pupils are equal, round, and reactive to light.  Cardiovascular:     Rate and Rhythm: Normal rate and regular rhythm.     Pulses: Normal pulses.     Heart sounds: No murmur heard.    No gallop.  Pulmonary:     Effort: Pulmonary effort is normal.     Breath sounds: Normal breath sounds. No wheezing or rales.  Abdominal:     Palpations: Abdomen is soft.     Tenderness: There is no abdominal tenderness.  Musculoskeletal:     Cervical back: Neck supple.     Right lower leg: No edema.     Left lower leg: No edema.  Lymphadenopathy:     Cervical: No cervical adenopathy.  Skin:    Findings: No lesion or rash.  Neurological:     General: No focal deficit present.     Mental Status: He is alert and oriented to person, place, and time.     Comments: Word naming 13/1 minute Recall 3/3  Psychiatric:        Mood and Affect: Mood normal.        Behavior: Behavior normal.            Assessment & Plan:

## 2023-02-20 NOTE — Assessment & Plan Note (Signed)
Distant lung cancer Does okay with gabapentin 300 bid Discussed walking stick prn for balance

## 2023-02-20 NOTE — Assessment & Plan Note (Signed)
Seems euthyroid Will check levels on levothyroxine 50

## 2023-02-20 NOTE — Assessment & Plan Note (Signed)
On imaging Continues on the rosuvastatin 5mg  daily

## 2023-03-09 DIAGNOSIS — H0011 Chalazion right upper eyelid: Secondary | ICD-10-CM | POA: Diagnosis not present

## 2023-03-13 ENCOUNTER — Other Ambulatory Visit: Payer: Self-pay | Admitting: Family

## 2023-03-13 ENCOUNTER — Other Ambulatory Visit: Payer: Medicare Other

## 2023-03-13 DIAGNOSIS — Z902 Acquired absence of lung [part of]: Secondary | ICD-10-CM

## 2023-03-20 ENCOUNTER — Ambulatory Visit (INDEPENDENT_AMBULATORY_CARE_PROVIDER_SITE_OTHER): Payer: Medicare Other | Admitting: Podiatry

## 2023-03-20 DIAGNOSIS — M79676 Pain in unspecified toe(s): Secondary | ICD-10-CM

## 2023-03-20 DIAGNOSIS — B351 Tinea unguium: Secondary | ICD-10-CM

## 2023-03-20 NOTE — Progress Notes (Signed)
Patient presents for laser maintenance treatment today.    There has been improvement proximally in all 10 nails.  Nails x10 were debrided with sterile nail nippers and power debriding burr.  [place] pulse laser treatment performed to all 10 toenails today for two passes.  F/u 3 months for laser maintenance care

## 2023-03-23 ENCOUNTER — Encounter: Payer: Self-pay | Admitting: Internal Medicine

## 2023-04-16 ENCOUNTER — Other Ambulatory Visit: Payer: Self-pay | Admitting: Internal Medicine

## 2023-04-23 ENCOUNTER — Encounter: Payer: Self-pay | Admitting: Internal Medicine

## 2023-05-07 ENCOUNTER — Other Ambulatory Visit: Payer: Self-pay | Admitting: Dermatology

## 2023-05-14 ENCOUNTER — Encounter: Payer: Self-pay | Admitting: Internal Medicine

## 2023-05-15 ENCOUNTER — Encounter: Payer: Self-pay | Admitting: Internal Medicine

## 2023-05-15 DIAGNOSIS — Z902 Acquired absence of lung [part of]: Secondary | ICD-10-CM

## 2023-05-15 MED ORDER — MONTELUKAST SODIUM 10 MG PO TABS
10.0000 mg | ORAL_TABLET | Freq: Every day | ORAL | 3 refills | Status: DC
Start: 2023-05-15 — End: 2024-04-26

## 2023-05-29 DIAGNOSIS — Z23 Encounter for immunization: Secondary | ICD-10-CM | POA: Diagnosis not present

## 2023-06-04 ENCOUNTER — Telehealth: Payer: Self-pay | Admitting: Gastroenterology

## 2023-06-04 ENCOUNTER — Telehealth: Payer: Self-pay

## 2023-06-04 NOTE — Telephone Encounter (Signed)
Patient left voice message to inquire if he needs to have repeat colonoscopy.  Chart reviewed last colonoscopy 11/30/19 with Dr. Allegra Lai.  Results letter  noted precancerous polyps.  Recommended repeat in 7 years.  Message routed to Dr. Allegra Lai to make sure that 7 years is still the recommendation for patient to repeat colonoscopy.  He is currently 82 years old.  Dr. Allegra Lai please review colonoscopy report and path to advise.  Thanks, [place], [place]

## 2023-06-04 NOTE — Telephone Encounter (Signed)
Patient called in and left a voicemail stating he had question about his last colonoscopy. I called the patient back and he didn't answer and I left a voicemail for him to call me back.

## 2023-06-05 NOTE — Telephone Encounter (Signed)
He doesn't need one at this time  RV

## 2023-06-10 ENCOUNTER — Encounter: Payer: Self-pay | Admitting: Internal Medicine

## 2023-06-12 ENCOUNTER — Other Ambulatory Visit: Payer: Self-pay | Admitting: Internal Medicine

## 2023-06-12 NOTE — Telephone Encounter (Signed)
Order faxed to [facility] PT

## 2023-06-14 NOTE — Progress Notes (Unsigned)
Cardiology Office Note:  .   Date:  06/15/2023  ID:  Kevin Terrell, DOB 02/07/1941, MRN [id number] PCP: Karie Schwalbe, MD  [facility] Providers Cardiologist:  Yvonne Kendall, MD     History of Present Illness: .   Kevin Terrell is a 82 y.o. male with history of aortic atherosclerosis and coronary artery calcification, hyperlipidemia, lung cancer status post right upper lobectomy and chemotherapy, hypothyroidism, GERD, arthritis, and peripheral neuropathy, who presents for follow-up of ASCVD and hyperlipidemia.  I last saw him in 09/2022, at which time he was feeling well and exercising regularly without difficulties.  We elected to perform a coronary calcium score for risk stratification, which was very high at 1031 (71st percentile).  Subsequent myocardial perfusion stress test was without evidence of ischemia or scar.  Today, Dr. Malva Cogan reports that he has been feeling quite well.  His only complaint is of chronic neuropathy in both feet that sometimes affects his balance.  He has not fallen.  He denies chest pain, shortness of breath, palpitations, and lightheadedness.  He will tries to walk 1 to 2 miles per day, 3-5 times a week.  He also recently began playing pickle ball.  He is tolerating low-dose rosuvastatin well.  He inquires about the safety of continuing omega-3 supplements given associations with atrial fibrillation.  ROS: See HPI  Studies Reviewed: Marland Kitchen   EKG Interpretation Date/Time:  Monday June 15 2023 13:22:56 EDT Ventricular Rate:  59 PR Interval:  186 QRS Duration:  94 QT Interval:  422 QTC Calculation: 417 R Axis:   -1  Text Interpretation: Sinus bradycardia with Premature atrial complexes Borderline ECG When compared with ECG of 04-Sep-2022 No significant change was found Confirmed by Angeleen Horney, Cristal Deer [phone]) on 06/15/2023 1:30:28 PM    Exercise MPI (09/15/2022): Low risk study without evidence of ischemia or scar.  LVEF > 65%. Coronary calcium  score (09/05/2022): Coronary calcium score 1031 (71st percentile for age and sex matched controls). Carotid Dopplers (12/06/2019): Minimal atherosclerotic plaquing without stenosis involving the right carotid artery.  Normal left carotid artery.  Risk Assessment/Calculations:             Physical Exam:   VS:  BP 118/72 (BP Location: Left Arm, Patient Position: Sitting, Cuff Size: Normal)   Pulse (!) 59   Ht 6\' 1"  (1.854 m)   Wt 190 lb 12.8 oz (86.5 kg)   SpO2 98%   BMI 25.17 kg/m    Wt Readings from Last 3 Encounters:  06/15/23 190 lb 12.8 oz (86.5 kg)  02/20/23 190 lb (86.2 kg)  09/04/22 189 lb (85.7 kg)    General:  NAD. Neck: No JVD or HJR. Lungs: Clear to auscultation bilaterally without wheezes or crackles. Heart: Regular rate and rhythm without murmurs, rubs, or gallops. Abdomen: Soft, nontender, nondistended. Extremities: No lower extremity edema.  ASSESSMENT AND PLAN: .    Coronary artery disease and aortic atherosclerosis: Dr. Malva Cogan continues to do well without angina.  High coronary calcium score noted in January with subsequent exercise MPI demonstrating absence of perfusion defects consistent with nonobstructive CAD.  Most recent lipid panel in June revealed LDL of 78.  We have agreed to escalate rosuvastatin to 10 mg daily, with lipid panel and ALT in about 3 months.  Continue aspirin.  Hyperlipidemia: As above, we will escalate rosuvastatin to 10 mg daily to target LDL less than 70.  I have encouraged Dr. Malva Cogan to stop fish oil, given lack of clinical benefit in preventing ASCVD  with normal triglycerides and potential association with atrial fibrillation.    Dispo: Return to clinic in 1 year.  Signed, Yvonne Kendall, MD

## 2023-06-15 ENCOUNTER — Ambulatory Visit: Payer: Medicare Other | Attending: Internal Medicine | Admitting: Internal Medicine

## 2023-06-15 ENCOUNTER — Encounter: Payer: Self-pay | Admitting: Internal Medicine

## 2023-06-15 VITALS — BP 118/72 | HR 59 | Ht 73.0 in | Wt 190.8 lb

## 2023-06-15 DIAGNOSIS — I2510 Atherosclerotic heart disease of native coronary artery without angina pectoris: Secondary | ICD-10-CM

## 2023-06-15 DIAGNOSIS — E7800 Pure hypercholesterolemia, unspecified: Secondary | ICD-10-CM

## 2023-06-15 DIAGNOSIS — I700 Atherosclerosis of aorta: Secondary | ICD-10-CM | POA: Diagnosis not present

## 2023-06-15 MED ORDER — ROSUVASTATIN CALCIUM 10 MG PO TABS: 10.0000 mg | ORAL_TABLET | Freq: Every day | ORAL | 3 refills | Status: DC

## 2023-06-15 NOTE — Patient Instructions (Signed)
Medication Instructions:  Your physician recommends the following medication changes.  STOP TAKING: Fish Oil  INCREASE: Rosuvastatin 10 mg by mouth daily    *If you need a refill on your cardiac medications before your next appointment, please call your pharmacy*   Lab Work: Your provider would like for you to return in January, 2025 to have the following labs drawn: (Lipid, ALT).   Please go to [facility] [address] [address], [place] [address] You do not need an appointment.  They are open from 7:30 am-4 pm.  Lunch from 1:00 pm- 2:00 pm You will need to be fasting.   Testing/Procedures: No test ordered today    Follow-Up: At [facility], you and your health needs are our priority.  As part of our continuing mission to provide you with exceptional heart care, we have created designated Provider Care Teams.  These Care Teams include your primary Cardiologist (physician) and Advanced Practice Providers (APPs -  Physician Assistants and Nurse Practitioners) who all work together to provide you with the care you need, when you need it.  We recommend signing up for the patient portal called "MyChart".  Sign up information is provided on this After Visit Summary.  MyChart is used to connect with patients for Virtual Visits (Telemedicine).  Patients are able to view lab/test results, encounter notes, upcoming appointments, etc.  Non-urgent messages can be sent to your provider as well.   To learn more about what you can do with MyChart, go to [email or web address].    Your next appointment:   1 year(s)  Provider:   You may see Yvonne Kendall, MD or one of the following Advanced Practice Providers on your designated Care Team:   Nicolasa Ducking, NP Eula Listen, PA-C Cadence Fransico Michael, PA-C Charlsie Quest, NP

## 2023-06-17 DIAGNOSIS — Z23 Encounter for immunization: Secondary | ICD-10-CM | POA: Diagnosis not present

## 2023-06-19 ENCOUNTER — Ambulatory Visit (INDEPENDENT_AMBULATORY_CARE_PROVIDER_SITE_OTHER): Payer: Medicare Other | Admitting: Podiatry

## 2023-06-19 DIAGNOSIS — B351 Tinea unguium: Secondary | ICD-10-CM

## 2023-06-19 NOTE — Progress Notes (Signed)
        HPI: 82 y.o. male presents today for laser nail maintenance treatment.  This is Dr. Geryl Rankins patient.  He is a retired International aid/development worker  EXAM: Upon review of the toenails, all 10 are affected.  There is discoloration, thickening of the nail, distal onycholysis and subungual debris consistent with onychomycosis.  There has been improvement since I last saw him.  A/P: Onychomycosis of toenails.  Number after review of laser safety with the patient, the 10 nails were debrided today in preparation for the laser.  The pinpoint laser nail treatments was utilized on the affected toenails for 2 passes.  Patient tolerated this well.  Will reschedule the patient for next maintenance treatment  in 3 months.    Clerance Lav, DPM, FACFAS [facility]     [address], [place] [address]

## 2023-06-29 ENCOUNTER — Telehealth: Payer: Self-pay | Admitting: Internal Medicine

## 2023-06-29 NOTE — Telephone Encounter (Signed)
Pt called in and stated that [facility] has a new fax number and would like for the Doctor to resubmit the order for physical therapy [facility] said they never got  the order new Fax [phone] and Apolinar Junes can be reached at [facility] at [phone] email [email or web address]

## 2023-06-29 NOTE — Telephone Encounter (Signed)
Left message on VM for pt thanking him for the new number. We had been faxing to the other number for the last 2 weeks.   I sent the previous rx for PT to scanning. Can you write a new one as it is not in the chart, yet.

## 2023-06-29 NOTE — Telephone Encounter (Signed)
Patient called in stating that the fax number was incorrect.. the correct fax number for [facility] is  [phone]

## 2023-06-29 NOTE — Telephone Encounter (Signed)
Faxed and received confirmation back

## 2023-07-02 DIAGNOSIS — G8929 Other chronic pain: Secondary | ICD-10-CM | POA: Diagnosis not present

## 2023-07-02 DIAGNOSIS — M25461 Effusion, right knee: Secondary | ICD-10-CM | POA: Diagnosis not present

## 2023-07-02 DIAGNOSIS — M170 Bilateral primary osteoarthritis of knee: Secondary | ICD-10-CM | POA: Diagnosis not present

## 2023-07-02 DIAGNOSIS — M25462 Effusion, left knee: Secondary | ICD-10-CM | POA: Diagnosis not present

## 2023-07-02 DIAGNOSIS — T451X5A Adverse effect of antineoplastic and immunosuppressive drugs, initial encounter: Secondary | ICD-10-CM | POA: Diagnosis not present

## 2023-07-02 DIAGNOSIS — G620 Drug-induced polyneuropathy: Secondary | ICD-10-CM | POA: Diagnosis not present

## 2023-07-09 DIAGNOSIS — M1711 Unilateral primary osteoarthritis, right knee: Secondary | ICD-10-CM | POA: Diagnosis not present

## 2023-07-09 DIAGNOSIS — M1712 Unilateral primary osteoarthritis, left knee: Secondary | ICD-10-CM | POA: Diagnosis not present

## 2023-07-13 ENCOUNTER — Other Ambulatory Visit: Payer: Self-pay | Admitting: Internal Medicine

## 2023-07-13 DIAGNOSIS — E039 Hypothyroidism, unspecified: Secondary | ICD-10-CM

## 2023-07-16 DIAGNOSIS — M170 Bilateral primary osteoarthritis of knee: Secondary | ICD-10-CM | POA: Diagnosis not present

## 2023-07-16 DIAGNOSIS — M25462 Effusion, left knee: Secondary | ICD-10-CM | POA: Diagnosis not present

## 2023-07-16 DIAGNOSIS — M25461 Effusion, right knee: Secondary | ICD-10-CM | POA: Diagnosis not present

## 2023-07-16 DIAGNOSIS — G8929 Other chronic pain: Secondary | ICD-10-CM | POA: Diagnosis not present

## 2023-07-20 DIAGNOSIS — M67922 Unspecified disorder of synovium and tendon, left upper arm: Secondary | ICD-10-CM | POA: Diagnosis not present

## 2023-07-20 DIAGNOSIS — M7522 Bicipital tendinitis, left shoulder: Secondary | ICD-10-CM | POA: Diagnosis not present

## 2023-07-20 DIAGNOSIS — G8929 Other chronic pain: Secondary | ICD-10-CM | POA: Diagnosis not present

## 2023-07-20 DIAGNOSIS — M19012 Primary osteoarthritis, left shoulder: Secondary | ICD-10-CM | POA: Diagnosis not present

## 2023-07-20 DIAGNOSIS — M24012 Loose body in left shoulder: Secondary | ICD-10-CM | POA: Diagnosis not present

## 2023-07-24 DIAGNOSIS — M7522 Bicipital tendinitis, left shoulder: Secondary | ICD-10-CM | POA: Diagnosis not present

## 2023-07-24 DIAGNOSIS — M67922 Unspecified disorder of synovium and tendon, left upper arm: Secondary | ICD-10-CM | POA: Diagnosis not present

## 2023-07-24 DIAGNOSIS — M25512 Pain in left shoulder: Secondary | ICD-10-CM | POA: Diagnosis not present

## 2023-07-24 DIAGNOSIS — M24012 Loose body in left shoulder: Secondary | ICD-10-CM | POA: Diagnosis not present

## 2023-07-24 DIAGNOSIS — M19012 Primary osteoarthritis, left shoulder: Secondary | ICD-10-CM | POA: Diagnosis not present

## 2023-07-27 ENCOUNTER — Encounter: Payer: Self-pay | Admitting: Dermatology

## 2023-07-27 ENCOUNTER — Ambulatory Visit: Payer: Medicare Other | Admitting: Dermatology

## 2023-07-27 DIAGNOSIS — Z872 Personal history of diseases of the skin and subcutaneous tissue: Secondary | ICD-10-CM

## 2023-07-27 DIAGNOSIS — Z85828 Personal history of other malignant neoplasm of skin: Secondary | ICD-10-CM

## 2023-07-27 DIAGNOSIS — M19012 Primary osteoarthritis, left shoulder: Secondary | ICD-10-CM | POA: Diagnosis not present

## 2023-07-27 DIAGNOSIS — L820 Inflamed seborrheic keratosis: Secondary | ICD-10-CM

## 2023-07-27 NOTE — Patient Instructions (Addendum)
Cryotherapy Aftercare  Wash gently with soap and water everyday.   Apply Vaseline and Band-Aid daily until healed.    Recommend daily broad spectrum sunscreen SPF 30+ to sun-exposed areas, reapply every 2 hours as needed. Call for new or changing lesions.  Staying in the shade or wearing long sleeves, sun glasses (UVA+UVB protection) and wide brim hats (4-inch brim around the entire circumference of the hat) are also recommended for sun protection.    Due to recent changes in healthcare laws, you may see results of your pathology and/or laboratory studies on MyChart before the doctors have had a chance to review them. We understand that in some cases there may be results that are confusing or concerning to you. Please understand that not all results are received at the same time and often the doctors may need to interpret multiple results in order to provide you with the best plan of care or course of treatment. Therefore, we ask that you please give [place] 2 business days to thoroughly review all your results before contacting the office for clarification. Should we see a critical lab result, you will be contacted sooner.   If You Need Anything After Your Visit  If you have any questions or concerns for your doctor, please call our main line at [phone] and press option 4 to reach your doctor's medical assistant. If no one answers, please leave a voicemail as directed and we will return your call as soon as possible. Messages left after 4 pm will be answered the following business day.   You may also send [place] a message via MyChart. We typically respond to MyChart messages within 1-2 business days.  For prescription refills, please ask your pharmacy to contact our office. Our fax number is [phone].  If you have an urgent issue when the clinic is closed that cannot wait until the next business day, you can page your doctor at the number below.    Please note that while we do our best to be  available for urgent issues outside of office hours, we are not available 24/7.   If you have an urgent issue and are unable to reach [place], you may choose to seek medical care at your doctor's office, retail clinic, urgent care center, or emergency room.  If you have a medical emergency, please immediately call 911 or go to the emergency department.  Pager Numbers  - Dr. Gwen Pounds: [phone]  - Dr. Roseanne Reno: [phone]  - Dr. Katrinka Blazing: [phone]   In the event of inclement weather, please call our main line at [phone] for an update on the status of any delays or closures.  Dermatology Medication Tips: Please keep the boxes that topical medications come in in order to help keep track of the instructions about where and how to use these. Pharmacies typically print the medication instructions only on the boxes and not directly on the medication tubes.   If your medication is too expensive, please contact our office at [phone] option 4 or send [place] a message through MyChart.   We are unable to tell what your co-pay for medications will be in advance as this is different depending on your insurance coverage. However, we may be able to find a substitute medication at lower cost or fill out paperwork to get insurance to cover a needed medication.   If a prior authorization is required to get your medication covered by your insurance company, please allow [place] 1-2 business days to complete this process.  Drug prices often vary depending on where the prescription is filled and some pharmacies may offer cheaper prices.  The website www.goodrx.com contains coupons for medications through different pharmacies. The prices here do not account for what the cost may be with help from insurance (it may be cheaper with your insurance), but the website can give you the price if you did not use any insurance.  - You can print the associated coupon and take it with your prescription to the pharmacy.   - You may also stop by our office during regular business hours and pick up a GoodRx coupon card.  - If you need your prescription sent electronically to a different pharmacy, notify our office through [facility] or by phone at [phone] option 4.     Si Usted Necesita Algo Despus de Su Visita  Tambin puede enviarnos un mensaje a travs de Clinical cytogeneticist. Por lo general respondemos a los mensajes de MyChart en el transcurso de 1 a 2 das hbiles.  Para renovar recetas, por favor pida a su farmacia que se ponga en contacto con nuestra oficina. Annie Sable de fax es [place] [phone].  Si tiene un asunto urgente cuando la clnica est cerrada y que no puede esperar hasta el siguiente da hbil, puede llamar/localizar a su doctor(a) al nmero que aparece a continuacin.   Por favor, tenga en cuenta que aunque hacemos todo lo posible para estar disponibles para asuntos urgentes fuera del horario de [place], no estamos disponibles las 24 horas del da, los 7 [address]  [address] de la [place].   Si tiene un problema urgente y no puede comunicarse con nosotros, puede optar por buscar atencin mdica  en el consultorio de su doctor(a), en una clnica privada, en un centro de atencin urgente o en una sala de emergencias.  Si tiene Engineer, drilling, por favor llame inmediatamente al 911 o vaya a la sala de emergencias.  Nmeros de bper  - Dr. Gwen Pounds: [phone]  - Dra. Roseanne Reno: [phone]  - Dr. Katrinka Blazing: [phone]   En caso de inclemencias del tiempo, por favor llame a Lacy Duverney principal al [phone] para una actualizacin sobre el [place] de cualquier retraso o cierre.  Consejos para la medicacin en dermatologa: Por favor, guarde las cajas en las que vienen los medicamentos de uso tpico para ayudarle a seguir las instrucciones sobre dnde y cmo usarlos. Las farmacias generalmente imprimen las instrucciones del medicamento slo en las cajas y no directamente en los tubos del  [place].   Si su medicamento es muy caro, por favor, pngase en contacto con Rolm Gala llamando al [phone] y presione la opcin 4 o envenos un mensaje a travs de Clinical cytogeneticist.   No podemos decirle cul ser su copago por los medicamentos por adelantado ya que esto es diferente dependiendo de la cobertura de su seguro. Sin embargo, es posible que podamos encontrar un medicamento sustituto a Audiological scientist un formulario para que el seguro cubra el medicamento que se considera necesario.   Si se requiere una autorizacin previa para que su compaa de seguros [place] su medicamento, por favor permtanos de 1 a 2 das hbiles para completar [address]  Los precios de los medicamentos varan con frecuencia dependiendo del Environmental consultant de dnde se surte la receta y alguna farmacias pueden ofrecer precios ms baratos.  El sitio web www.goodrx.com tiene cupones para medicamentos de Health and safety inspector. Los precios aqu no tienen en cuenta lo que podra costar con la ayuda del seguro (puede ser  ms barato con su seguro), pero el sitio web puede darle el precio si no Visual merchandiser.  - Puede imprimir el cupn correspondiente y llevarlo con su receta a la farmacia.  - Tambin puede pasar por nuestra oficina durante el horario de atencin regular y Education officer, museum una tarjeta de cupones de GoodRx.  - Si necesita que su receta se enve electrnicamente a una farmacia diferente, informe a nuestra oficina a travs de MyChart de [place] o por telfono llamando al [phone] y presione la opcin 4.

## 2023-07-27 NOTE — Progress Notes (Signed)
   Follow-Up Visit   Subjective  Kevin Terrell is a 82 y.o. male who presents for the following: Spots on face, back, chest and right lower leg. Hx of AKs. Hx of ISKs. HxSCC  The patient has spots, moles and lesions to be evaluated, some may be new or changing and the patient may have concern these could be cancer.     The following portions of the chart were reviewed this encounter and updated as appropriate: medications, allergies, medical history  Review of Systems:  No other skin or systemic complaints except as noted in HPI or Assessment and Plan.  Objective  Well appearing patient in no apparent distress; mood and affect are within normal limits.  A focused examination was performed of the following areas: Face, ears, neck, arms, hands, torso, right shin Relevant physical exam findings are noted in the Assessment and Plan.  Left chest x1, right forearm x1 Erythematous keratotic or waxy stuck-on papule or plaque.    Assessment & Plan   Inflamed seborrheic keratosis Left chest x1, right forearm x1  Symptomatic, irritating, patient would like treated.  Destruction of lesion - Left chest x1, right forearm x1 Complexity: simple   Destruction method: cryotherapy   Informed consent: discussed and consent obtained   Timeout:  patient name, date of birth, surgical site, and procedure verified Lesion destroyed using liquid nitrogen: Yes   Region frozen until ice ball extended beyond lesion: Yes   Cryo cycles: 1 or 2. Outcome: patient tolerated procedure well with no complications   Post-procedure details: wound care instructions given   Additional details:  Prior to procedure, discussed risks of blister formation, small wound, skin dyspigmentation, or rare scar following cryotherapy. Recommend Vaseline ointment to treated areas while healing.   History of AK  Exam: clear today, no sign of recurrence.  Treatment: Benign-appearing.  Observation.  Call clinic for new or  changing lesions.  Recommend daily use of broad spectrum spf 30+ sunscreen to sun-exposed areas.     Return in about 3 months (around 10/27/2023) for TBSE, HxSCC.  I, Lawson Radar, CMA, am acting as scribe for Elie Goody, MD.   Documentation: I have reviewed the above documentation for accuracy and completeness, and I agree with the above.  Elie Goody, MD

## 2023-07-28 ENCOUNTER — Ambulatory Visit (INDEPENDENT_AMBULATORY_CARE_PROVIDER_SITE_OTHER): Payer: Medicare Other | Admitting: Internal Medicine

## 2023-07-28 ENCOUNTER — Encounter: Payer: Self-pay | Admitting: Internal Medicine

## 2023-07-28 VITALS — BP 102/68 | HR 57 | Temp 98.0°F | Ht 73.0 in | Wt 196.0 lb

## 2023-07-28 DIAGNOSIS — G620 Drug-induced polyneuropathy: Secondary | ICD-10-CM

## 2023-07-28 DIAGNOSIS — T451X5A Adverse effect of antineoplastic and immunosuppressive drugs, initial encounter: Secondary | ICD-10-CM | POA: Diagnosis not present

## 2023-07-28 DIAGNOSIS — H9192 Unspecified hearing loss, left ear: Secondary | ICD-10-CM

## 2023-07-28 DIAGNOSIS — H9190 Unspecified hearing loss, unspecified ear: Secondary | ICD-10-CM | POA: Insufficient documentation

## 2023-07-28 NOTE — Assessment & Plan Note (Signed)
Discussed peroxide drops Needs audiology evaluation

## 2023-07-28 NOTE — Progress Notes (Signed)
Subjective:    Patient ID: Kevin Terrell, male    DOB: 1941/04/19, 82 y.o.   MRN: [id number]  HPI Here due to concerns about the neuropathy and other issues  Has ongoing PT for leg strength and balance issues Had evaluation and has had some exercises, etc Working on muscles on sides of foot  Is getting acupuncture for feet--seems to help some (also for knees, neck and shoulders and groin) Is on the gabapentin---has random stabbing sensation--usually brief (taking 300 bid)  Has concerns about his ears Feels his hearing on the left is off No vertigo No pain  Finds the meloxicam is very effective for his pain issues---but rarely takes it Has tried tylenol--- now using 1000 bid or tid Some GERD--famotidine controls with bedtime dose. Uses pepcid plus prn  Did see cardiologist--Dr End Mostly vegetarian diet Coronary calcium at 71%---but low risk stress perfusion Rosuvastatin increased to 10mg  Discussed vitamin D----I don't recommend any extra in addition to the MVI  Current Outpatient Medications on File Prior to Visit  Medication Sig Dispense Refill   albuterol (VENTOLIN HFA) 108 (90 Base) MCG/ACT inhaler Inhale 1-2 puffs into the lungs every 6 (six) hours as needed. 8 g 0   aspirin EC 81 MG tablet Take 1 tablet (81 mg total) by mouth daily. Swallow whole.     cetirizine (ZYRTEC) 10 MG tablet Take 10 mg by mouth daily.     cholecalciferol (VITAMIN D3) 25 MCG (1000 UT) tablet Take 1,000 Units by mouth daily.     diclofenac Sodium (VOLTAREN) 1 % GEL APPLY FOUR GM TO BOTH KNEES FOUR TIMES DAILY AS NEEDED 600 g 4   famotidine (PEPCID) 20 MG tablet TAKE ONE TABLET BY MOUTH EVERY NIGHT AT BEDTIME 90 tablet 3   fexofenadine (ALLEGRA) 180 MG tablet Take 180 mg by mouth daily.     fluticasone (FLONASE) 50 MCG/ACT nasal spray Place 1 spray into both nostrils 2 (two) times daily.     gabapentin (NEURONTIN) 300 MG capsule TAKE 1 CAPSULE BY MOUTH TWICE DAILY 180 capsule 3    glucosamine-chondroitin 500-400 MG tablet Take 1 tablet by mouth 3 (three) times daily.     ketoconazole (NIZORAL) 2 % cream APPLY A SMALL AMOUNT TO AFFECTED AREA TWICE DAILY 60 g 3   levothyroxine (SYNTHROID) 50 MCG tablet TAKE ONE TABLET BY MOUTH ONCE A DAY BEFORE BREAKFAST. 90 tablet 3   Liniments (DEEP BLUE RELIEF) GEL Apply topically.     meloxicam (MOBIC) 15 MG tablet TAKE ONE TABLET BY MOUTH DAILY AS NEEDED 90 tablet 0   montelukast (SINGULAIR) 10 MG tablet Take 1 tablet (10 mg total) by mouth at bedtime. 90 tablet 3   Multiple Vitamin (MULTIVITAMIN WITH MINERALS) TABS tablet Take 1 tablet by mouth daily.     Probiotic Product (PROBIOTIC PO) Take 1 capsule by mouth 2 (two) times daily.     rosuvastatin (CRESTOR) 10 MG tablet Take 1 tablet (10 mg total) by mouth daily. 90 tablet 3   sildenafil (VIAGRA) 100 MG tablet Take 1 tablet (100 mg total) by mouth daily as needed for erectile dysfunction. 10 tablet 11   triamcinolone ointment (KENALOG) 0.1 % APPLY TO HANDS AT BEDTIME AND COVER WITHGLOVES AS NEEDED FOR FLARES. AVOID FACE/GROIN/AXILLA. 80 g 1   No current facility-administered medications on file prior to visit.    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    Past Surgical History:  Procedure Laterality Date   CATARACT EXTRACTION W/PHACO Left 04/01/2022   Procedure: CATARACT EXTRACTION  PHACO AND INTRAOCULAR LENS PLACEMENT (IOC) LEFT VIVITY TORIC;  Surgeon: Galen Manila, MD;  Location: [facility];  Service: Ophthalmology;  Laterality: Left;  7.53 00:41.3   CATARACT EXTRACTION W/PHACO Right 04/15/2022   Procedure: CATARACT EXTRACTION PHACO AND INTRAOCULAR LENS PLACEMENT (IOC) RIGHT VIVITY TORIC;  Surgeon: Galen Manila, MD;  Location: [facility];  Service: Ophthalmology;  Laterality: Right;  5.35 00:36.3   CHOLECYSTECTOMY     COLONOSCOPY     COLONOSCOPY WITH PROPOFOL N/A 11/30/2019   Procedure: COLONOSCOPY WITH PROPOFOL;  Surgeon: Toney Reil, MD;  Location[facility];  Service: Gastroenterology;  Laterality: N/A;   EYE SURGERY     vitreous detachment---laser   KNEE CARTILAGE SURGERY Right 1968   KNEE SURGERY     bilateral meniscal surgeries   LUNG LOBECTOMY     upper right lung 12/15/2007    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   UPPER GI ENDOSCOPY      Family History  Problem Relation Age of Onset   Cancer Mother    Alzheimer's disease Mother    Stroke Maternal Grandmother    Hypertension Father    Alzheimer's disease Father     Social History   Socioeconomic History   Marital status: Widowed    Spouse name: Not on file   Number of children: 3   Years of education: Not on file   Highest education level: Professional school degree (e.g., MD, DDS, DVM, JD)  Occupational History   Occupation: Veterinarian    Comment: retired  Tobacco Use   Smoking status: Never    Passive exposure: Never   Smokeless tobacco: Never  Vaping Use   Vaping status: Never Used  Substance and Sexual Activity   Alcohol use: Yes    Comment: Once glass of wine per month, none last 24hrs   Drug use: Never   Sexual activity: Yes  Other Topics Concern   Not on file  Social History Narrative   Married   Veterinarian   Wife died 2023-03-04   Has a daughter in [place]; son and daughter in [place]    6 grandchildren   [name] from [place]; spent  some time in [place]; moved here 2019.    Lives at [facility]      Has advanced directives   Daughter Maralyn Sago is health care POA   Would accept resuscitation attempts   No feeding tube if cognitively unaware   Social Determinants of Health   Financial Resource Strain: Low Risk  (07/25/2023)   Overall Financial Resource Strain (CARDIA)    Difficulty of Paying Living Expenses: Not hard at all  Food Insecurity: No Food Insecurity (07/25/2023)   Hunger Vital Sign    Worried About Running Out of Food in the Last Year: Never true  Ran Out of Food in the Last Year: Never true  Transportation Needs: No Transportation Needs (07/25/2023)   PRAPARE - Administrator, Civil Service (Medical): No    Lack of Transportation (Non-Medical): No  Physical Activity: Sufficiently Active (07/25/2023)   Exercise Vital Sign    Days of Exercise per Week: 5 days    Minutes of Exercise per Session: 60 min  Stress: No Stress Concern Present (07/25/2023)   [employer] of Occupational Health - Occupational Stress Questionnaire    Feeling of Stress : Not at all  Social Connections: Moderately Integrated (07/25/2023)   Social Connection and Isolation Panel [NHANES]    Frequency of Communication with Friends and Family: More than three times a week    Frequency of Social Gatherings with Friends and Family: More than three times a week    Attends Religious Services: More than 4 times per year    Active Member of [employer] or Organizations: Yes    Attends Banker Meetings: More than 4 times per year    Marital Status: Widowed  Intimate Partner Violence: Not At Risk (10/26/2018)   Humiliation, Afraid, Rape, and Kick questionnaire    Fear of Current or Ex-Partner: No    Emotionally Abused: No    Physically Abused: No    Sexually Abused: No   Review of Systems Some stomach upset with sildenafil on empty stomach Wonders about the advertised product on TV    Objective:   Physical  Exam Constitutional:      Appearance: Normal appearance.  HENT:     Ears:     Comments: Mild cerumenosis on right---~50% on left TM looks okay Neurological:     Mental Status: He is alert.            Assessment & Plan:

## 2023-07-28 NOTE — Assessment & Plan Note (Signed)
Some better with PT and acupuncture Continues gabapentin 300 bid Can consider TENS, lidocaine and alpha lipoic acid

## 2023-08-03 DIAGNOSIS — M19012 Primary osteoarthritis, left shoulder: Secondary | ICD-10-CM | POA: Diagnosis not present

## 2023-08-04 DIAGNOSIS — M67922 Unspecified disorder of synovium and tendon, left upper arm: Secondary | ICD-10-CM | POA: Diagnosis not present

## 2023-08-04 DIAGNOSIS — M7522 Bicipital tendinitis, left shoulder: Secondary | ICD-10-CM | POA: Diagnosis not present

## 2023-08-04 DIAGNOSIS — M24012 Loose body in left shoulder: Secondary | ICD-10-CM | POA: Diagnosis not present

## 2023-08-04 DIAGNOSIS — M25512 Pain in left shoulder: Secondary | ICD-10-CM | POA: Diagnosis not present

## 2023-08-04 DIAGNOSIS — M19012 Primary osteoarthritis, left shoulder: Secondary | ICD-10-CM | POA: Diagnosis not present

## 2023-08-06 DIAGNOSIS — M24012 Loose body in left shoulder: Secondary | ICD-10-CM | POA: Diagnosis not present

## 2023-08-06 DIAGNOSIS — M19012 Primary osteoarthritis, left shoulder: Secondary | ICD-10-CM | POA: Diagnosis not present

## 2023-08-06 DIAGNOSIS — M25512 Pain in left shoulder: Secondary | ICD-10-CM | POA: Diagnosis not present

## 2023-08-06 DIAGNOSIS — M7522 Bicipital tendinitis, left shoulder: Secondary | ICD-10-CM | POA: Diagnosis not present

## 2023-08-06 DIAGNOSIS — M67922 Unspecified disorder of synovium and tendon, left upper arm: Secondary | ICD-10-CM | POA: Diagnosis not present

## 2023-08-10 DIAGNOSIS — M19012 Primary osteoarthritis, left shoulder: Secondary | ICD-10-CM | POA: Diagnosis not present

## 2023-08-10 DIAGNOSIS — M7522 Bicipital tendinitis, left shoulder: Secondary | ICD-10-CM | POA: Diagnosis not present

## 2023-08-10 DIAGNOSIS — M67922 Unspecified disorder of synovium and tendon, left upper arm: Secondary | ICD-10-CM | POA: Diagnosis not present

## 2023-08-10 DIAGNOSIS — M24012 Loose body in left shoulder: Secondary | ICD-10-CM | POA: Diagnosis not present

## 2023-08-10 DIAGNOSIS — M25512 Pain in left shoulder: Secondary | ICD-10-CM | POA: Diagnosis not present

## 2023-08-13 ENCOUNTER — Ambulatory Visit: Payer: Medicare Other | Admitting: Dermatology

## 2023-08-13 ENCOUNTER — Encounter: Payer: Self-pay | Admitting: Dermatology

## 2023-08-13 DIAGNOSIS — L570 Actinic keratosis: Secondary | ICD-10-CM | POA: Diagnosis not present

## 2023-08-13 DIAGNOSIS — M24012 Loose body in left shoulder: Secondary | ICD-10-CM | POA: Diagnosis not present

## 2023-08-13 DIAGNOSIS — M7522 Bicipital tendinitis, left shoulder: Secondary | ICD-10-CM | POA: Diagnosis not present

## 2023-08-13 DIAGNOSIS — Z872 Personal history of diseases of the skin and subcutaneous tissue: Secondary | ICD-10-CM

## 2023-08-13 DIAGNOSIS — L820 Inflamed seborrheic keratosis: Secondary | ICD-10-CM

## 2023-08-13 DIAGNOSIS — L814 Other melanin hyperpigmentation: Secondary | ICD-10-CM | POA: Diagnosis not present

## 2023-08-13 DIAGNOSIS — M25512 Pain in left shoulder: Secondary | ICD-10-CM | POA: Diagnosis not present

## 2023-08-13 DIAGNOSIS — L578 Other skin changes due to chronic exposure to nonionizing radiation: Secondary | ICD-10-CM | POA: Diagnosis not present

## 2023-08-13 DIAGNOSIS — M19012 Primary osteoarthritis, left shoulder: Secondary | ICD-10-CM | POA: Diagnosis not present

## 2023-08-13 DIAGNOSIS — R234 Changes in skin texture: Secondary | ICD-10-CM | POA: Diagnosis not present

## 2023-08-13 DIAGNOSIS — M67922 Unspecified disorder of synovium and tendon, left upper arm: Secondary | ICD-10-CM | POA: Diagnosis not present

## 2023-08-13 DIAGNOSIS — L821 Other seborrheic keratosis: Secondary | ICD-10-CM | POA: Diagnosis not present

## 2023-08-13 DIAGNOSIS — W908XXA Exposure to other nonionizing radiation, initial encounter: Secondary | ICD-10-CM | POA: Diagnosis not present

## 2023-08-13 MED ORDER — MUPIROCIN 2 % EX OINT: TOPICAL_OINTMENT | CUTANEOUS | 1 refills | Status: AC

## 2023-08-13 NOTE — Patient Instructions (Addendum)
 Cryotherapy Aftercare  Wash gently with soap and water everyday.   Apply Vaseline jelly daily until healed.    Recommend daily broad spectrum sunscreen SPF 30+ to sun-exposed areas, reapply every 2 hours as needed. Call for new or changing lesions.  Staying in the shade or wearing long sleeves, sun glasses (UVA+UVB protection) and wide brim hats (4-inch brim around the entire circumference of the hat) are also recommended for sun protection.     Due to recent changes in healthcare laws, you may see results of your pathology and/or laboratory studies on MyChart before the doctors have had a chance to review them. We understand that in some cases there may be results that are confusing or concerning to you. Please understand that not all results are received at the same time and often the doctors may need to interpret multiple results in order to provide you with the best plan of care or course of treatment. Therefore, we ask that you please give [place] 2 business days to thoroughly review all your results before contacting the office for clarification. Should we see a critical lab result, you will be contacted sooner.   If You Need Anything After Your Visit  If you have any questions or concerns for your doctor, please call our main line at [phone] and press option 4 to reach your doctor's medical assistant. If no one answers, please leave a voicemail as directed and we will return your call as soon as possible. Messages left after 4 pm will be answered the following business day.   You may also send [place] a message via MyChart. We typically respond to MyChart messages within 1-2 business days.  For prescription refills, please ask your pharmacy to contact our office. Our fax number is [phone].  If you have an urgent issue when the clinic is closed that cannot wait until the next business day, you can page your doctor at the number below.    Please note that while we do our best to be  available for urgent issues outside of office hours, we are not available 24/7.   If you have an urgent issue and are unable to reach [place], you may choose to seek medical care at your doctor's office, retail clinic, urgent care center, or emergency room.  If you have a medical emergency, please immediately call 911 or go to the emergency department.  Pager Numbers  - Dr. Gwen Pounds: [phone]  - Dr. Roseanne Reno: [phone]  - Dr. Katrinka Blazing: [phone]   In the event of inclement weather, please call our main line at [phone] for an update on the status of any delays or closures.  Dermatology Medication Tips: Please keep the boxes that topical medications come in in order to help keep track of the instructions about where and how to use these. Pharmacies typically print the medication instructions only on the boxes and not directly on the medication tubes.   If your medication is too expensive, please contact our office at [phone] option 4 or send [place] a message through MyChart.   We are unable to tell what your co-pay for medications will be in advance as this is different depending on your insurance coverage. However, we may be able to find a substitute medication at lower cost or fill out paperwork to get insurance to cover a needed medication.   If a prior authorization is required to get your medication covered by your insurance company, please allow [place] 1-2 business days to complete this process.  Drug prices often vary depending on where the prescription is filled and some pharmacies may offer cheaper prices.  The website www.goodrx.com contains coupons for medications through different pharmacies. The prices here do not account for what the cost may be with help from insurance (it may be cheaper with your insurance), but the website can give you the price if you did not use any insurance.  - You can print the associated coupon and take it with your prescription to the pharmacy.   - You may also stop by our office during regular business hours and pick up a GoodRx coupon card.  - If you need your prescription sent electronically to a different pharmacy, notify our office through [facility] or by phone at [phone] option 4.     Si Usted Necesita Algo Despus de Su Visita  Tambin puede enviarnos un mensaje a travs de Clinical cytogeneticist. Por lo general respondemos a los mensajes de MyChart en el transcurso de 1 a 2 das hbiles.  Para renovar recetas, por favor pida a su farmacia que se ponga en contacto con nuestra oficina. Annie Sable de fax es [place] [phone].  Si tiene un asunto urgente cuando la clnica est cerrada y que no puede esperar hasta el siguiente da hbil, puede llamar/localizar a su doctor(a) al nmero que aparece a continuacin.   Por favor, tenga en cuenta que aunque hacemos todo lo posible para estar disponibles para asuntos urgentes fuera del horario de [place], no estamos disponibles las 24 horas del da, los 7 [address]  [address] de la [place].   Si tiene un problema urgente y no puede comunicarse con nosotros, puede optar por buscar atencin mdica  en el consultorio de su doctor(a), en una clnica privada, en un centro de atencin urgente o en una sala de emergencias.  Si tiene Engineer, drilling, por favor llame inmediatamente al 911 o vaya a la sala de emergencias.  Nmeros de bper  - Dr. Gwen Pounds: [phone]  - Dra. Roseanne Reno: [phone]  - Dr. Katrinka Blazing: [phone]   En caso de inclemencias del tiempo, por favor llame a Lacy Duverney principal al [phone] para una actualizacin sobre el [place] de cualquier retraso o cierre.  Consejos para la medicacin en dermatologa: Por favor, guarde las cajas en las que vienen los medicamentos de uso tpico para ayudarle a seguir las instrucciones sobre dnde y cmo usarlos. Las farmacias generalmente imprimen las instrucciones del medicamento slo en las cajas y no directamente en los tubos del  [place] .   Si su medicamento es muy caro, por favor, pngase en contacto con Rolm Gala llamando al [phone] y presione la opcin 4 o envenos un mensaje a travs de Clinical cytogeneticist.   No podemos decirle cul ser su copago por los medicamentos por adelantado ya que esto es diferente dependiendo de la cobertura de su seguro. Sin embargo, es posible que podamos encontrar un medicamento sustituto a Audiological scientist un formulario para que el seguro cubra el medicamento que se considera necesario.   Si se requiere una autorizacin previa para que su compaa de seguros [place] su medicamento, por favor permtanos de 1 a 2 das hbiles para completar [address]  Los precios de los medicamentos varan con frecuencia dependiendo del Environmental consultant de dnde se surte la receta y alguna farmacias pueden ofrecer precios ms baratos.  El sitio web www.goodrx.com tiene cupones para medicamentos de Health and safety inspector. Los precios aqu no tienen en cuenta lo que podra costar con la ayuda del seguro (puede ser  ms barato con su seguro), pero el sitio web puede darle el precio si no Visual merchandiser.  - Puede imprimir el cupn correspondiente y llevarlo con su receta a la farmacia.  - Tambin puede pasar por nuestra oficina durante el horario de atencin regular y Education officer, museum una tarjeta de cupones de GoodRx.  - Si necesita que su receta se enve electrnicamente a una farmacia diferente, informe a nuestra oficina a travs de MyChart de [place] o por telfono llamando al [phone] y presione la opcin 4.

## 2023-08-13 NOTE — Progress Notes (Signed)
Follow-Up Visit   Subjective  Kevin Terrell is a 82 y.o. male who presents for the following: multiple new spots to check.   ISK treated 07/27/2023 with LN2 had some exudate around it recently. Would like to make sure it is healing normally. Used Mupirocin ointment.  Lesion at right posterior ear sulcus. Rubbed by glasses. Concerned lesion could be cancerous.  Hx of AKs on forehead. Has new scaly lesions he would like checked. New lesion on chest. Raised, rough.     The following portions of the chart were reviewed this encounter and updated as appropriate: medications, allergies, medical history  Review of Systems:  No other skin or systemic complaints except as noted in HPI or Assessment and Plan.  Objective  Well appearing patient in no apparent distress; mood and affect are within normal limits.  Areas examined face, ears, neck, arms, hands, chest.. Relevant physical exam findings are noted in the Assessment and Plan.   L cheek x1, L temple x1, L superior forehead x6, R laterl cheek x1, R temple x1, nasal tipx1 (11) Erythematous thin papules/macules with gritty scale.  left upper chest x1 Erythematous keratotic or waxy stuck-on papule or plaque.  Assessment & Plan   SEBORRHEIC KERATOSIS - Stuck-on, waxy, tan-brown plaque at right postauricular sulcus  - Benign-appearing - Discussed benign etiology and prognosis. 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 They are chronic with expected duration over 1 year. A portion of actinic keratoses will progress to squamous cell carcinoma of the skin. It is not possible to reliably predict which spots will progress to skin cancer and so treatment is recommended to prevent development of skin  cancer.  Recommend daily broad spectrum sunscreen SPF 30+ to sun-exposed areas, reapply every 2 hours as needed.  Recommend staying in the shade or wearing long sleeves, sun glasses (UVA+UVB protection) and wide brim hats (4-inch brim around the entire circumference of the hat). Call for new or changing lesions. Destruction of lesion - L cheek x1, L temple x1, L superior forehead x6, R laterl cheek x1, R temple x1, nasal tipx1 (11) Complexity: simple   Destruction method: cryotherapy   Informed consent: discussed and consent obtained   Timeout:  patient name, date of birth, surgical site, and procedure verified Lesion destroyed using liquid nitrogen: Yes   Region frozen until ice ball extended beyond lesion: Yes   Cryo cycles: 1 or 2. Outcome: patient tolerated procedure well with no complications   Post-procedure details: wound care instructions given   Additional details:  Prior to procedure, discussed risks of blister formation, small wound, skin dyspigmentation, or rare scar following cryotherapy. Recommend Vaseline ointment to treated areas while healing.  INFLAMED SEBORRHEIC KERATOSIS left upper chest x1 Symptomatic, irritating, patient would like treated. Destruction of lesion - left upper chest x1 Complexity: simple   Destruction method: cryotherapy   Informed consent: discussed and consent obtained   Timeout:  patient name, date of birth, surgical site, and procedure verified Lesion destroyed using liquid nitrogen: Yes   Region frozen until ice ball extended beyond lesion: Yes   Cryo cycles: 1 or 2. Outcome: patient tolerated procedure well with no complications   Post-procedure details: wound care instructions given   Additional details:  Prior to procedure,  discussed risks of blister formation, small wound, skin dyspigmentation, or rare scar following cryotherapy. Recommend Vaseline ointment to treated areas while healing.    Actinic Damage - Chronic condition, secondary  to cumulative UV/sun exposure - diffuse scaly erythematous macules with underlying dyspigmentation - Recommend daily broad spectrum sunscreen SPF 30+ to sun-exposed areas, reapply every 2 hours as needed.  - Staying in the shade or wearing long sleeves, sun glasses (UVA+UVB protection) and wide brim hats (4-inch brim around the entire circumference of the hat) are also recommended for sun protection.  - Call for new or changing lesions. Discussed topical field treatment.   Lentigines, Seborrheic Keratoses- Benign normal skin lesions - Benign-appearing - Call for any changes  Hemorrhagic crusting 2/2 cryotherapy  Exam: scab at right forearm  Treatment: Recommend Vaseline Jelly while healing.  Patient requests Mupirocin. Rx sent to pharmacy.    Return for TBSE As Scheduled, With Dr. Katrinka Blazing.  I, Lawson Radar, CMA, am acting as scribe for Elie Goody, MD.   Documentation: I have reviewed the above documentation for accuracy and completeness, and I agree with the above.  Elie Goody, MD

## 2023-08-17 DIAGNOSIS — M25512 Pain in left shoulder: Secondary | ICD-10-CM | POA: Diagnosis not present

## 2023-08-17 DIAGNOSIS — M24012 Loose body in left shoulder: Secondary | ICD-10-CM | POA: Diagnosis not present

## 2023-08-17 DIAGNOSIS — M67922 Unspecified disorder of synovium and tendon, left upper arm: Secondary | ICD-10-CM | POA: Diagnosis not present

## 2023-08-17 DIAGNOSIS — M19012 Primary osteoarthritis, left shoulder: Secondary | ICD-10-CM | POA: Diagnosis not present

## 2023-08-17 DIAGNOSIS — M7522 Bicipital tendinitis, left shoulder: Secondary | ICD-10-CM | POA: Diagnosis not present

## 2023-08-21 ENCOUNTER — Other Ambulatory Visit: Payer: Self-pay | Admitting: Dermatology

## 2023-08-21 ENCOUNTER — Other Ambulatory Visit: Payer: Self-pay | Admitting: Internal Medicine

## 2023-08-21 DIAGNOSIS — R21 Rash and other nonspecific skin eruption: Secondary | ICD-10-CM

## 2023-08-31 DIAGNOSIS — M24012 Loose body in left shoulder: Secondary | ICD-10-CM | POA: Diagnosis not present

## 2023-08-31 DIAGNOSIS — M67922 Unspecified disorder of synovium and tendon, left upper arm: Secondary | ICD-10-CM | POA: Diagnosis not present

## 2023-08-31 DIAGNOSIS — M19012 Primary osteoarthritis, left shoulder: Secondary | ICD-10-CM | POA: Diagnosis not present

## 2023-08-31 DIAGNOSIS — M7522 Bicipital tendinitis, left shoulder: Secondary | ICD-10-CM | POA: Diagnosis not present

## 2023-08-31 DIAGNOSIS — M25512 Pain in left shoulder: Secondary | ICD-10-CM | POA: Diagnosis not present

## 2023-09-07 DIAGNOSIS — M19012 Primary osteoarthritis, left shoulder: Secondary | ICD-10-CM | POA: Diagnosis not present

## 2023-09-07 DIAGNOSIS — M24012 Loose body in left shoulder: Secondary | ICD-10-CM | POA: Diagnosis not present

## 2023-09-07 DIAGNOSIS — M7502 Adhesive capsulitis of left shoulder: Secondary | ICD-10-CM | POA: Diagnosis not present

## 2023-09-07 DIAGNOSIS — M7522 Bicipital tendinitis, left shoulder: Secondary | ICD-10-CM | POA: Diagnosis not present

## 2023-09-07 DIAGNOSIS — M67922 Unspecified disorder of synovium and tendon, left upper arm: Secondary | ICD-10-CM | POA: Diagnosis not present

## 2023-09-07 DIAGNOSIS — M25512 Pain in left shoulder: Secondary | ICD-10-CM | POA: Diagnosis not present

## 2023-09-10 ENCOUNTER — Other Ambulatory Visit: Payer: Self-pay | Admitting: Dermatology

## 2023-09-10 DIAGNOSIS — R21 Rash and other nonspecific skin eruption: Secondary | ICD-10-CM

## 2023-09-14 DIAGNOSIS — M24012 Loose body in left shoulder: Secondary | ICD-10-CM | POA: Diagnosis not present

## 2023-09-14 DIAGNOSIS — M25512 Pain in left shoulder: Secondary | ICD-10-CM | POA: Diagnosis not present

## 2023-09-14 DIAGNOSIS — M67922 Unspecified disorder of synovium and tendon, left upper arm: Secondary | ICD-10-CM | POA: Diagnosis not present

## 2023-09-14 DIAGNOSIS — M19012 Primary osteoarthritis, left shoulder: Secondary | ICD-10-CM | POA: Diagnosis not present

## 2023-09-14 DIAGNOSIS — M7502 Adhesive capsulitis of left shoulder: Secondary | ICD-10-CM | POA: Diagnosis not present

## 2023-09-14 DIAGNOSIS — M7522 Bicipital tendinitis, left shoulder: Secondary | ICD-10-CM | POA: Diagnosis not present

## 2023-09-17 ENCOUNTER — Encounter: Payer: Self-pay | Admitting: Internal Medicine

## 2023-09-21 DIAGNOSIS — M67922 Unspecified disorder of synovium and tendon, left upper arm: Secondary | ICD-10-CM | POA: Diagnosis not present

## 2023-09-21 DIAGNOSIS — M19012 Primary osteoarthritis, left shoulder: Secondary | ICD-10-CM | POA: Diagnosis not present

## 2023-09-21 DIAGNOSIS — M25512 Pain in left shoulder: Secondary | ICD-10-CM | POA: Diagnosis not present

## 2023-09-21 DIAGNOSIS — M7502 Adhesive capsulitis of left shoulder: Secondary | ICD-10-CM | POA: Diagnosis not present

## 2023-09-21 DIAGNOSIS — M7522 Bicipital tendinitis, left shoulder: Secondary | ICD-10-CM | POA: Diagnosis not present

## 2023-09-21 DIAGNOSIS — M24012 Loose body in left shoulder: Secondary | ICD-10-CM | POA: Diagnosis not present

## 2023-09-25 ENCOUNTER — Other Ambulatory Visit: Payer: Medicare Other

## 2023-09-28 DIAGNOSIS — M19012 Primary osteoarthritis, left shoulder: Secondary | ICD-10-CM | POA: Diagnosis not present

## 2023-09-28 DIAGNOSIS — M24012 Loose body in left shoulder: Secondary | ICD-10-CM | POA: Diagnosis not present

## 2023-09-28 DIAGNOSIS — M7522 Bicipital tendinitis, left shoulder: Secondary | ICD-10-CM | POA: Diagnosis not present

## 2023-09-28 DIAGNOSIS — M7502 Adhesive capsulitis of left shoulder: Secondary | ICD-10-CM | POA: Diagnosis not present

## 2023-09-28 DIAGNOSIS — M25512 Pain in left shoulder: Secondary | ICD-10-CM | POA: Diagnosis not present

## 2023-09-28 DIAGNOSIS — M67922 Unspecified disorder of synovium and tendon, left upper arm: Secondary | ICD-10-CM | POA: Diagnosis not present

## 2023-10-01 ENCOUNTER — Telehealth: Payer: Self-pay | Admitting: Internal Medicine

## 2023-10-01 DIAGNOSIS — M67922 Unspecified disorder of synovium and tendon, left upper arm: Secondary | ICD-10-CM | POA: Diagnosis not present

## 2023-10-01 DIAGNOSIS — M7522 Bicipital tendinitis, left shoulder: Secondary | ICD-10-CM | POA: Diagnosis not present

## 2023-10-01 DIAGNOSIS — M25512 Pain in left shoulder: Secondary | ICD-10-CM | POA: Diagnosis not present

## 2023-10-01 DIAGNOSIS — M7502 Adhesive capsulitis of left shoulder: Secondary | ICD-10-CM | POA: Diagnosis not present

## 2023-10-01 DIAGNOSIS — M24012 Loose body in left shoulder: Secondary | ICD-10-CM | POA: Diagnosis not present

## 2023-10-01 DIAGNOSIS — M19012 Primary osteoarthritis, left shoulder: Secondary | ICD-10-CM | POA: Diagnosis not present

## 2023-10-01 NOTE — Telephone Encounter (Signed)
Pt c/o medication issue:  1. Name of Medication: rosuvastatin (CRESTOR) 10 MG tablet   2. How are you currently taking this medication (dosage and times per day)? As prescribed   3. Are you having a reaction (difficulty breathing--STAT)?   4. What is your medication issue? Patient is requesting call back to discuss medication and get clarification on reasons for medication. Would also like to discuss labs and f/u appts. Please advise.

## 2023-10-01 NOTE — Telephone Encounter (Signed)
Spoke with pt who wanted to confirm he is due for lab work. Per last office visit on 06/14/24, Dr. Okey Dupre recommended repeating lab work in 3 months. Pt made aware and verbalized understanding.

## 2023-10-02 ENCOUNTER — Ambulatory Visit (INDEPENDENT_AMBULATORY_CARE_PROVIDER_SITE_OTHER): Payer: Medicare Other | Admitting: Podiatrist

## 2023-10-02 DIAGNOSIS — B351 Tinea unguium: Secondary | ICD-10-CM

## 2023-10-02 NOTE — Progress Notes (Signed)
        HPI: 83 y.o. male presents today for laser nail maintenance treatment.  This is Dr. Geryl Rankins patient.  He is a retired International aid/development worker  EXAM: Upon review of the toenails, all 10 are affected.  There is discoloration, thickening of the nail, distal onycholysis and subungual debris consistent with onychomycosis.  There has been improvement since I last saw him.  A/P: Onychomycosis of toenails.   10 nails were debrided today in preparation for the laser.  The pinpoint laser nail treatments was utilized on the affected toenails for 2 passes.  Safety glasses were worn and Patient tolerated this well.  Will reschedule the patient for next maintenance treatment  in 3 months.

## 2023-10-07 ENCOUNTER — Other Ambulatory Visit: Payer: Self-pay | Admitting: Internal Medicine

## 2023-10-07 ENCOUNTER — Ambulatory Visit: Payer: Medicare Other | Admitting: Internal Medicine

## 2023-10-07 ENCOUNTER — Telehealth: Payer: Self-pay | Admitting: Internal Medicine

## 2023-10-07 ENCOUNTER — Encounter: Payer: Self-pay | Admitting: Internal Medicine

## 2023-10-07 VITALS — BP 110/70 | HR 86 | Temp 98.9°F | Ht 73.0 in | Wt 195.0 lb

## 2023-10-07 DIAGNOSIS — J22 Unspecified acute lower respiratory infection: Secondary | ICD-10-CM | POA: Insufficient documentation

## 2023-10-07 MED ORDER — HYDROCODONE BIT-HOMATROP MBR 5-1.5 MG/5ML PO SOLN: 5.0000 mL | Freq: Every evening | ORAL | 0 refills | Status: DC | PRN

## 2023-10-07 MED ORDER — DOXYCYCLINE HYCLATE 100 MG PO TABS: 100.0000 mg | ORAL_TABLET | Freq: Two times a day (BID) | ORAL | 0 refills | Status: DC

## 2023-10-07 NOTE — Telephone Encounter (Signed)
 Copied from CRM [id number]. Topic: General - Other >> Oct 07, 2023  8:55 AM Deaijah H wrote: Reason for CRM: Patient would like for Amy Clinical research associate) to give him a call / please call [phone]

## 2023-10-07 NOTE — Assessment & Plan Note (Signed)
 Could be lingering Mycoplasma infection Will treat with doxy 100  bid x 7 days (with refill) Hycodan prn

## 2023-10-07 NOTE — Progress Notes (Signed)
 Subjective:    Patient ID: Kevin Terrell, male    DOB: 10-08-1940, 83 y.o.   MRN: [id number]  HPI Here due to persistent respiratory illness  Had a simple cold around Christmas time Seemed to improve--but then it worsened and went down to his chest  No fever, chills or sweats Some myalgias Cough productive of sputum--color has changed (now green)---but not a lot Having pain in groin due to the severity of cough No overt SOB--but notices a change in breathing  Mild sore throat Slight ear pain Some headache--top of head mostly and frontal  Used tylenol  1000 tid  Current Outpatient Medications on File Prior to Visit  Medication Sig Dispense Refill   albuterol  (VENTOLIN  HFA) 108 (90 Base) MCG/ACT inhaler Inhale 1-2 puffs into the lungs every 6 (six) hours as needed. 8 g 0   aspirin  EC 81 MG tablet Take 1 tablet (81 mg total) by mouth daily. Swallow whole.     cetirizine (ZYRTEC) 10 MG tablet Take 10 mg by mouth daily.     diclofenac  Sodium (VOLTAREN ) 1 % GEL APPLY FOUR GM TO BOTH KNEES FOUR TIMES DAILY AS NEEDED 600 g 4   famotidine  (PEPCID ) 20 MG tablet TAKE ONE TABLET BY MOUTH EVERY NIGHT AT BEDTIME 90 tablet 3   fexofenadine (ALLEGRA) 180 MG tablet Take 180 mg by mouth daily.     fluticasone  (FLONASE ) 50 MCG/ACT nasal spray Place 1 spray into both nostrils 2 (two) times daily.     gabapentin  (NEURONTIN ) 300 MG capsule TAKE 1 CAPSULE BY MOUTH TWICE DAILY 180 capsule 3   glucosamine-chondroitin 500-400 MG tablet Take 1 tablet by mouth 3 (three) times daily.     ketoconazole (NIZORAL) 2 % cream APPLY A SMALL AMOUNT TO AFFECTED AREA TWICE DAILY 60 g 3   levothyroxine  (SYNTHROID ) 50 MCG tablet TAKE ONE TABLET BY MOUTH ONCE A DAY BEFORE BREAKFAST. 90 tablet 3   Liniments (DEEP BLUE RELIEF) GEL Apply topically.     meloxicam  (MOBIC ) 15 MG tablet TAKE ONE TABLET BY MOUTH DAILY AS NEEDED 90 tablet 0   montelukast  (SINGULAIR ) 10 MG tablet Take 1 tablet (10 mg total) by mouth at  bedtime. 90 tablet 3   Multiple Vitamin (MULTIVITAMIN WITH MINERALS) TABS tablet Take 1 tablet by mouth daily.     mupirocin  ointment (BACTROBAN ) 2 % Apply twice daily as needed 22 g 1   Probiotic Product (PROBIOTIC PO) Take 1 capsule by mouth 2 (two) times daily.     rosuvastatin  (CRESTOR ) 10 MG tablet Take 1 tablet (10 mg total) by mouth daily. 90 tablet 3   sildenafil  (VIAGRA ) 100 MG tablet Take 1 tablet (100 mg total) by mouth daily as needed for erectile dysfunction. 10 tablet 11   triamcinolone  cream (KENALOG ) 0.1 % APPLY TO HANDS AT BEDTIME AND COVER WITH GLOVES AS NEEDED FOR FLARES. AVOID FACE, GROIN, AXILLA. 80 g 0   No current facility-administered medications on file prior to visit.    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   right upper arm, excised 10/31/21    Past Surgical History:  Procedure Laterality Date   CATARACT EXTRACTION W/PHACO Left 04/01/2022   Procedure: CATARACT EXTRACTION PHACO AND INTRAOCULAR LENS PLACEMENT (IOC) LEFT VIVITY TORIC;  Surgeon: Jaye Fallow, MD;  Location: [facility];  Service: Ophthalmology;  Laterality: Left;  7.53 00:41.3   CATARACT EXTRACTION W/PHACO Right 04/15/2022   Procedure: CATARACT EXTRACTION PHACO AND INTRAOCULAR LENS PLACEMENT (IOC) RIGHT VIVITY TORIC;  Surgeon: Jaye Fallow, MD;  Location: [facility];  Service: Ophthalmology;  Laterality: Right;   5.35 00:36.3   CHOLECYSTECTOMY     COLONOSCOPY     COLONOSCOPY WITH PROPOFOL  N/A 11/30/2019   Procedure: COLONOSCOPY WITH PROPOFOL ;  Surgeon: Unk Corinn Skiff, MD;  Location: [facility];  Service: Gastroenterology;  Laterality: N/A;   EYE SURGERY     vitreous detachment---laser   KNEE CARTILAGE SURGERY Right 1968   KNEE SURGERY     bilateral meniscal surgeries   LUNG LOBECTOMY     upper right lung 12/15/2007    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   UPPER GI ENDOSCOPY      Family History  Problem Relation Age of Onset   Cancer Mother    Alzheimer's disease Mother    Stroke Maternal Grandmother    Hypertension Father    Alzheimer's disease Father     Social History   Socioeconomic History   Marital status: Widowed    Spouse name: Not on file   Number of children: 3   Years of education: Not on file   Highest education level: Professional school degree (e.g., MD, DDS, DVM, JD)  Occupational History   Occupation: Veterinarian    Comment: retired  Tobacco Use   Smoking status: Never    Passive exposure: Never   Smokeless tobacco: Never  Vaping Use   Vaping status: Never Used  Substance and Sexual Activity   Alcohol use: Yes    Comment: Once glass of wine per month, none last 24hrs   Drug use: Never   Sexual activity: Yes  Other Topics Concern   Not on file  Social History Narrative   Married   Veterinarian   Wife died 2024/03/16   Has a daughter in [place]; son and daughter in [place]     6 grandchildren   Mora from [place]; spent some time in [place]; moved here 2019.    Lives at [facility]      Has advanced directives   Daughter Lauraine is health care POA   Would accept resuscitation attempts   No feeding tube if cognitively unaware   Social Drivers of Health   Financial Resource Strain: Low Risk  (10/06/2023)   Overall Financial Resource Strain (CARDIA)    Difficulty of Paying Living Expenses: Not hard at all  Food Insecurity: No Food Insecurity  (10/06/2023)   Hunger Vital Sign    Worried About Running Out of Food in the Last Year: Never true    Ran Out of Food in the Last Year: Never true  Transportation Needs: No Transportation Needs (10/06/2023)   PRAPARE - Administrator, Civil Service (Medical): No    Lack of Transportation (Non-Medical): No  Physical Activity: Sufficiently Active (07/25/2023)   Exercise Vital Sign    Days of Exercise per Week: 5 days  Minutes of Exercise per Session: 60 min  Stress: No Stress Concern Present (10/06/2023)   [employer] of Occupational Health - Occupational Stress Questionnaire    Feeling of Stress : Not at all  Social Connections: Moderately Integrated (10/06/2023)   Social Connection and Isolation Panel [NHANES]    Frequency of Communication with Friends and Family: More than three times a week    Frequency of Social Gatherings with Friends and Family: More than three times a week    Attends Religious Services: More than 4 times per year    Active Member of [employer] or Organizations: Yes    Attends Banker Meetings: More than 4 times per year    Marital Status: Widowed  Intimate Partner Violence: Not At Risk (10/26/2018)   Humiliation, Afraid, Rape, and Kick questionnaire    Fear of Current or Ex-Partner: No    Emotionally Abused: No    Physically Abused: No    Sexually Abused: No   Review of Systems No N/V Slightly loose stools Eating okay--but taste off some      Objective:   Physical Exam Constitutional:      Appearance: Normal appearance.     Comments: Coarse cough  HENT:     Right Ear: Tympanic membrane and ear canal normal.     Left Ear: Tympanic membrane and ear canal normal.     Mouth/Throat:     Pharynx: No oropharyngeal exudate or posterior oropharyngeal erythema.  Pulmonary:     Effort: Pulmonary effort is normal.     Breath sounds: Normal breath sounds. No wheezing or rales.  Musculoskeletal:     Cervical back: Neck supple.   Lymphadenopathy:     Cervical: No cervical adenopathy.  Neurological:     Mental Status: He is alert.            Assessment & Plan:

## 2023-10-07 NOTE — Telephone Encounter (Signed)
 Spoke with patient and made him aware that Dr. Vallarie Gauze would accept his TOC. Scheduled an appointment for sometime in October.

## 2023-10-07 NOTE — Telephone Encounter (Signed)
 Patient would like to know if he could setup a TOC with [facility].   Requests transfer to Dr Maria Shiner would also like Ms Sueanne Emerald at [facility] (is coming to me in June for last visit though)

## 2023-10-07 NOTE — Telephone Encounter (Signed)
 Okay to transfer to me after his visit with Dr. Joelle Musca.  Thanks.

## 2023-10-12 ENCOUNTER — Telehealth: Payer: Self-pay

## 2023-10-12 MED ORDER — PREDNISONE 20 MG PO TABS: 40.0000 mg | ORAL_TABLET | Freq: Every day | ORAL | 0 refills | Status: DC

## 2023-10-12 NOTE — Telephone Encounter (Signed)
 Copied from CRM [id number]. Topic: Clinical - Medical Advice >> Oct 12, 2023  7:35 AM Kevin Terrell wrote: Reason for CRM: Requesting A Call Back >> Oct 12, 2023  7:38 AM Kevin Terrell wrote: Patient states he's been using the cough medicine and antibiotics as advised but he still has this cough that's not letting up, but he's wanting advise as far as what to do from here moving forward. Patient states he will NOT be taking any morning medications until he hears back from the office. Patient call back number is [phone]

## 2023-10-12 NOTE — Addendum Note (Signed)
 Addended by: Curt Dover I on: 10/12/2023 07:59 AM   Modules accepted: Orders

## 2023-10-12 NOTE — Telephone Encounter (Signed)
 Unable to reach patient. Left voicemail to return call to our office.

## 2023-10-12 NOTE — Telephone Encounter (Signed)
 Pt called back returning Kelli's call. Told pt Dr. Alger Infield response. Pt had no questions/concerns. Pt mentioned having a small fever, didn't state exact temp. Call back # [phone]

## 2023-10-14 NOTE — Telephone Encounter (Signed)
I expect I will hear from him if things aren't improving

## 2023-10-14 NOTE — Telephone Encounter (Signed)
Copied from CRM [id number]. Topic: General - Other >> Oct 14, 2023 10:27 AM Elizebeth Brooking wrote: Reason for CRM: Patient called in to inform Dr.Letvak that his coughing is doing better, and that he will continue to take the medication

## 2023-10-14 NOTE — Telephone Encounter (Signed)
Okay---I am glad to hear that

## 2023-10-15 ENCOUNTER — Telehealth: Payer: Self-pay

## 2023-10-15 NOTE — Telephone Encounter (Signed)
Copied from CRM [id number]. Topic: Clinical - Medical Advice >> Oct 15, 2023  2:41 PM Elizebeth Brooking wrote: Reason for CRM:  Patient called in regarding his update on having walking pneumonia taking 2 and reducing after today.  Cough is 50% better but not gone  when taking tylenol doesnt have fever and when not taking tylenol he has a low grade fever  Wants to know if he is fine to keep doing what he is doing or need to do something else

## 2023-10-16 NOTE — Telephone Encounter (Signed)
Spoke to pt. He said he thinks the prednisone is working. He feels he is getting better slowly. Not coughing as much. He will follow-up on Monday to let us know if he feels he needs an OV and xray.

## 2023-10-19 ENCOUNTER — Telehealth: Payer: Self-pay

## 2023-10-19 NOTE — Telephone Encounter (Signed)
 Copied from CRM [id number]. Topic: Clinical - Request for Lab/Test Order >> Oct 19, 2023  9:50 AM Kevin Terrell wrote: Reason for CRM: Patient called and said he is still having a cough and symptoms of his walking pneumonia. Dr. Alphonsus Sias wanted him to get a chest Xray if he was still coughing today. Please order the Xray and call aptietn to schedule.

## 2023-10-19 NOTE — Telephone Encounter (Signed)
 Left message on VM per DPR for pt to call and schedule an OV tomorrow morning.

## 2023-10-19 NOTE — Telephone Encounter (Signed)
Pt is scheduled for 945 tomorrow

## 2023-10-20 ENCOUNTER — Ambulatory Visit (INDEPENDENT_AMBULATORY_CARE_PROVIDER_SITE_OTHER): Payer: Medicare Other | Admitting: Internal Medicine

## 2023-10-20 ENCOUNTER — Encounter: Payer: Self-pay | Admitting: Internal Medicine

## 2023-10-20 ENCOUNTER — Ambulatory Visit (INDEPENDENT_AMBULATORY_CARE_PROVIDER_SITE_OTHER)
Admission: RE | Admit: 2023-10-20 | Discharge: 2023-10-20 | Disposition: A | Discharge status: Home / Self Care | Payer: Medicare Other | Source: Ambulatory Visit | Attending: Internal Medicine | Admitting: Internal Medicine

## 2023-10-20 VITALS — BP 102/80 | HR 71 | Temp 98.3°F | Ht 73.0 in | Wt 196.0 lb

## 2023-10-20 DIAGNOSIS — R059 Cough, unspecified: Secondary | ICD-10-CM | POA: Diagnosis not present

## 2023-10-20 DIAGNOSIS — J22 Unspecified acute lower respiratory infection: Secondary | ICD-10-CM | POA: Diagnosis not present

## 2023-10-20 DIAGNOSIS — M7502 Adhesive capsulitis of left shoulder: Secondary | ICD-10-CM | POA: Diagnosis not present

## 2023-10-20 DIAGNOSIS — J181 Lobar pneumonia, unspecified organism: Secondary | ICD-10-CM | POA: Insufficient documentation

## 2023-10-20 DIAGNOSIS — R918 Other nonspecific abnormal finding of lung field: Secondary | ICD-10-CM | POA: Diagnosis not present

## 2023-10-20 DIAGNOSIS — M25512 Pain in left shoulder: Secondary | ICD-10-CM | POA: Diagnosis not present

## 2023-10-20 MED ORDER — AZITHROMYCIN 250 MG PO TABS: ORAL_TABLET | ORAL | 0 refills | Status: DC

## 2023-10-20 NOTE — Assessment & Plan Note (Signed)
 Ongoing symptoms Prednisone last week did seem to help a bit Doxy 2 weeks ago also seemed to help some Will check CXR

## 2023-10-20 NOTE — Progress Notes (Signed)
 Subjective:    Patient ID: Kevin Terrell, male    DOB: [birth year], 83 y.o.   MRN: [id number]  HPI Here for ongoing respiratory symptoms  Seen 2 weeks ago--had mild improvement after the antibiotic Still having cough--productive of white sputum Very slight temp elevation without the tylenol (1000 tid) Did have feeling of SOB after cough--that is mostly better Has friend doing his shopping, etc Has had to rest more--feels fatigued  Current Outpatient Medications on File Prior to Visit  Medication Sig Dispense Refill   albuterol (VENTOLIN HFA) 108 (90 Base) MCG/ACT inhaler Inhale 1-2 puffs into the lungs every 6 (six) hours as needed. 8 g 0   aspirin EC 81 MG tablet Take 1 tablet (81 mg total) by mouth daily. Swallow whole.     cetirizine (ZYRTEC) 10 MG tablet Take 10 mg by mouth daily.     diclofenac Sodium (VOLTAREN) 1 % GEL APPLY FOUR GM TO BOTH KNEES FOUR TIMES DAILY AS NEEDED 600 g 4   famotidine (PEPCID) 20 MG tablet TAKE ONE TABLET BY MOUTH EVERY NIGHT AT BEDTIME 90 tablet 3   fexofenadine (ALLEGRA) 180 MG tablet Take 180 mg by mouth daily.     fluticasone (FLONASE) 50 MCG/ACT nasal spray Place 1 spray into both nostrils 2 (two) times daily.     gabapentin (NEURONTIN) 300 MG capsule TAKE ONE CAPSULE BY MOUTH TWICE DAILY 180 capsule 3   glucosamine-chondroitin 500-400 MG tablet Take 1 tablet by mouth 3 (three) times daily.     HYDROcodone bit-homatropine (HYCODAN) 5-1.5 MG/5ML syrup Take 5 mLs by mouth at bedtime as needed for cough. 120 mL 0   ketoconazole (NIZORAL) 2 % cream APPLY A SMALL AMOUNT TO AFFECTED AREA TWICE DAILY 60 g 3   levothyroxine (SYNTHROID) 50 MCG tablet TAKE ONE TABLET BY MOUTH ONCE A DAY BEFORE BREAKFAST. 90 tablet 3   Liniments (DEEP BLUE RELIEF) GEL Apply topically.     meloxicam (MOBIC) 15 MG tablet TAKE ONE TABLET BY MOUTH DAILY AS NEEDED 90 tablet 0   montelukast (SINGULAIR) 10 MG tablet Take 1 tablet (10 mg total) by mouth at bedtime. 90 tablet 3    Multiple Vitamin (MULTIVITAMIN WITH MINERALS) TABS tablet Take 1 tablet by mouth daily.     mupirocin ointment (BACTROBAN) 2 % Apply twice daily as needed 22 g 1   predniSONE (DELTASONE) 20 MG tablet Take 2 tablets (40 mg total) by mouth daily. For 3 days-then 1 tab daily for 3 days 9 tablet 0   Probiotic Product (PROBIOTIC PO) Take 1 capsule by mouth 2 (two) times daily.     rosuvastatin (CRESTOR) 10 MG tablet Take 1 tablet (10 mg total) by mouth daily. 90 tablet 3   sildenafil (VIAGRA) 100 MG tablet Take 1 tablet (100 mg total) by mouth daily as needed for erectile dysfunction. 10 tablet 11   triamcinolone cream (KENALOG) 0.1 % APPLY TO HANDS AT BEDTIME AND COVER WITH GLOVES AS NEEDED FOR FLARES. AVOID FACE, GROIN, AXILLA. 80 g 0   No current facility-administered medications on file prior to visit.    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    Past Surgical History:  Procedure Laterality Date   CATARACT EXTRACTION W/PHACO Left 04/01/2022   Procedure: CATARACT EXTRACTION PHACO AND INTRAOCULAR LENS PLACEMENT (IOC) LEFT VIVITY TORIC;  Surgeon: Galen Manila, MD;  Location: [facility];  Service: Ophthalmology;  Laterality: Left;  7.53 00:41.3   CATARACT EXTRACTION W/PHACO Right 04/15/2022   Procedure: CATARACT EXTRACTION PHACO AND INTRAOCULAR LENS PLACEMENT (IOC) RIGHT VIVITY TORIC;   Surgeon: Galen Manila, MD;  Location: [facility];  Service: Ophthalmology;  Laterality: Right;  5.35 00:36.3   CHOLECYSTECTOMY     COLONOSCOPY     COLONOSCOPY WITH PROPOFOL N/A 11/30/2019   Procedure: COLONOSCOPY WITH PROPOFOL;  Surgeon: Toney Reil, MD;  Location: [facility];  Service: Gastroenterology;  Laterality: N/A;   EYE SURGERY     vitreous detachment---laser   KNEE CARTILAGE SURGERY Right 1968   KNEE SURGERY     bilateral meniscal surgeries   LUNG LOBECTOMY     upper right lung 12/15/2007    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   UPPER GI ENDOSCOPY      Family History  Problem Relation Age of Onset   Cancer Mother    Alzheimer's disease Mother    Stroke Maternal Grandmother    Hypertension Father    Alzheimer's disease Father     Social History   Socioeconomic History   Marital status: Widowed    Spouse name: Not on file   Number of children: 3   Years of education: Not on file   Highest education level: Professional school degree (e.g., MD, DDS, DVM, JD)  Occupational History   Occupation: Veterinarian    Comment: retired  Tobacco Use   Smoking status: Never    Passive exposure: Never   Smokeless tobacco: Never  Vaping Use   Vaping status: Never Used  Substance and Sexual Activity   Alcohol use: Yes    Comment: Once glass of wine per month, none last 24hrs   Drug use: Never   Sexual activity: Yes  Other Topics Concern   Not on file  Social History Narrative   Married   Veterinarian   Wife died 2024-03-07   Has a daughter in [place]; son and daughter in [place]    6 grandchildren   [place] from [place]; spent some time in [place]; moved here 2019.    Lives at [facility]      Has advanced directives   Daughter Maralyn Sago is health care POA   Would accept resuscitation attempts   No feeding tube if cognitively unaware   Social Drivers of Health   Financial Resource Strain: Low Risk  (10/06/2023)   Overall Financial Resource Strain  (CARDIA)    Difficulty of Paying Living Expenses: Not hard at all  Food Insecurity: No Food Insecurity (10/06/2023)   Hunger Vital Sign    Worried About Running Out of Food in the Last Year: Never true    Ran Out of Food in the Last Year: Never true  Transportation Needs: No Transportation Needs (10/06/2023)   PRAPARE - Administrator, Civil Service (Medical): No    Lack of Transportation (Non-Medical): No  Physical  Activity: Sufficiently Active (07/25/2023)   Exercise Vital Sign    Days of Exercise per Week: 5 days    Minutes of Exercise per Session: 60 min  Stress: No Stress Concern Present (10/06/2023)   [employer] of Occupational Health - Occupational Stress Questionnaire    Feeling of Stress : Not at all  Social Connections: Moderately Integrated (10/06/2023)   Social Connection and Isolation Panel [NHANES]    Frequency of Communication with Friends and Family: More than three times a week    Frequency of Social Gatherings with Friends and Family: More than three times a week    Attends Religious Services: More than 4 times per year    Active Member of [employer] or Organizations: Yes    Attends Banker Meetings: More than 4 times per year    Marital Status: Widowed  Intimate Partner Violence: Not At Risk (10/26/2018)   Humiliation, Afraid, Rape, and Kick questionnaire    Fear of Current or Ex-Partner: No    Emotionally Abused: No    Physically Abused: No    Sexually Abused: No   Review of Systems No N/V Eating okay--but appetite is off Now able to sleep better----no longer needing the hycodan (just delsym now)    Objective:   Physical Exam Constitutional:      Appearance: Normal appearance.  HENT:     Head:     Comments: Slight frontal tenderness    Right Ear: Tympanic membrane and ear canal normal.     Left Ear: Tympanic membrane and ear canal normal.     Mouth/Throat:     Pharynx: No oropharyngeal exudate or posterior oropharyngeal erythema.   Cardiovascular:     Rate and Rhythm: Normal rate and regular rhythm.     Heart sounds: No murmur heard.    No gallop.  Pulmonary:     Effort: Pulmonary effort is normal.     Breath sounds: No wheezing.     Comments: Very slight rhonchi in upper lobes Transient RLL fine crackles--then gone with some deep breaths Musculoskeletal:     Cervical back: Neck supple.  Lymphadenopathy:     Cervical: No cervical adenopathy.  Neurological:     Mental Status: He is alert.            Assessment & Plan:

## 2023-10-20 NOTE — Assessment & Plan Note (Signed)
 Seems to have RML infiltrate----that is new since last CXR (despite chronic changes since lobectomy on that side) Discussed options---observation, levaquin (with black box warning) or azithromycin Will give the azithromycin Await radiology overread--will plan repeat then

## 2023-10-22 DIAGNOSIS — M25512 Pain in left shoulder: Secondary | ICD-10-CM | POA: Diagnosis not present

## 2023-10-22 DIAGNOSIS — I2510 Atherosclerotic heart disease of native coronary artery without angina pectoris: Secondary | ICD-10-CM | POA: Diagnosis not present

## 2023-10-22 DIAGNOSIS — M7502 Adhesive capsulitis of left shoulder: Secondary | ICD-10-CM | POA: Diagnosis not present

## 2023-10-23 ENCOUNTER — Encounter: Payer: Self-pay | Admitting: Internal Medicine

## 2023-10-23 LAB — LIPID PANEL
Chol/HDL Ratio: 3.1 {ratio} (ref 0.0–5.0)
Cholesterol, Total: 144 mg/dL (ref 100–199)
HDL: 46 mg/dL (ref >39)
LDL Chol Calc (NIH): 76 mg/dL (ref 0–99)
Triglycerides: 120 mg/dL (ref 0–149)
VLDL Cholesterol Cal: 22 mg/dL (ref 5–40)

## 2023-10-23 LAB — ALT: ALT: 19 [IU]/L (ref 0–44)

## 2023-10-26 DIAGNOSIS — M25512 Pain in left shoulder: Secondary | ICD-10-CM | POA: Diagnosis not present

## 2023-10-26 DIAGNOSIS — M7502 Adhesive capsulitis of left shoulder: Secondary | ICD-10-CM | POA: Diagnosis not present

## 2023-10-27 ENCOUNTER — Encounter: Payer: Self-pay | Admitting: Dermatology

## 2023-10-27 ENCOUNTER — Ambulatory Visit (INDEPENDENT_AMBULATORY_CARE_PROVIDER_SITE_OTHER): Payer: Medicare Other | Admitting: Dermatology

## 2023-10-27 DIAGNOSIS — W908XXA Exposure to other nonionizing radiation, initial encounter: Secondary | ICD-10-CM | POA: Diagnosis not present

## 2023-10-27 DIAGNOSIS — S20419A Abrasion of unspecified back wall of thorax, initial encounter: Secondary | ICD-10-CM | POA: Diagnosis not present

## 2023-10-27 DIAGNOSIS — T148XXA Other injury of unspecified body region, initial encounter: Secondary | ICD-10-CM

## 2023-10-27 DIAGNOSIS — Z1283 Encounter for screening for malignant neoplasm of skin: Secondary | ICD-10-CM | POA: Diagnosis not present

## 2023-10-27 DIAGNOSIS — L814 Other melanin hyperpigmentation: Secondary | ICD-10-CM

## 2023-10-27 DIAGNOSIS — L817 Pigmented purpuric dermatosis: Secondary | ICD-10-CM | POA: Diagnosis not present

## 2023-10-27 DIAGNOSIS — L570 Actinic keratosis: Secondary | ICD-10-CM | POA: Diagnosis not present

## 2023-10-27 DIAGNOSIS — D229 Melanocytic nevi, unspecified: Secondary | ICD-10-CM

## 2023-10-27 DIAGNOSIS — D1801 Hemangioma of skin and subcutaneous tissue: Secondary | ICD-10-CM

## 2023-10-27 DIAGNOSIS — Z872 Personal history of diseases of the skin and subcutaneous tissue: Secondary | ICD-10-CM | POA: Diagnosis not present

## 2023-10-27 DIAGNOSIS — L578 Other skin changes due to chronic exposure to nonionizing radiation: Secondary | ICD-10-CM | POA: Diagnosis not present

## 2023-10-27 DIAGNOSIS — L821 Other seborrheic keratosis: Secondary | ICD-10-CM | POA: Diagnosis not present

## 2023-10-27 DIAGNOSIS — L820 Inflamed seborrheic keratosis: Secondary | ICD-10-CM | POA: Diagnosis not present

## 2023-10-27 DIAGNOSIS — Z85828 Personal history of other malignant neoplasm of skin: Secondary | ICD-10-CM

## 2023-10-27 NOTE — Progress Notes (Signed)
 Follow-Up Visit   Subjective  Kevin Terrell is a 83 y.o. male who presents for the following: Skin Cancer Screening and Full Body Skin Exam. Hx of SCC. Hx of AKs.   Recheck AKs treated with LN2 08/13/2023 at B/L cheeks, B/L temples, forehead and nasal tip.   Scattered areas of concern. Patient has areas marked. Irritated when drying off with a towel.   The patient presents for Total-Body Skin Exam (TBSE) for skin cancer screening and mole check. The patient has spots, moles and lesions to be evaluated, some may be new or changing and the patient may have concern these could be cancer.  This patient is accompanied in the office by his  companion, [name] .   The following portions of the chart were reviewed this encounter and updated as appropriate: medications, allergies, medical history  Review of Systems:  No other skin or systemic complaints except as noted in HPI or Assessment and Plan.  Objective  Well appearing patient in no apparent distress; mood and affect are within normal limits.  A full examination was performed including scalp, head, eyes, ears, nose, lips, neck, chest, axillae, abdomen, back, buttocks, bilateral upper extremities, bilateral lower extremities, hands, feet, fingers, toes, fingernails, and toenails. All findings within normal limits unless otherwise noted below.   Relevant physical exam findings are noted in the Assessment and Plan.  B/L lower legs Rust colored macules, confluent to patches  Back x5, L chest below nipple/areola x1 (6) Erythematous keratotic or waxy stuck-on papule or plaque. left superior forehead at hair line x4, right superior forehead x2, R temple x1 (7) Erythematous thin papules/macules with gritty scale.   Assessment & Plan   SKIN CANCER SCREENING PERFORMED TODAY.  HISTORY OF SQUAMOUS CELL CARCINOMA OF THE SKIN. Right upper arm. Excised 10/31/2021. - No evidence of recurrence today - No lymphadenopathy - Recommend regular  full body skin exams - Recommend daily broad spectrum sunscreen SPF 30+ to sun-exposed areas, reapply every 2 hours as needed.  - Call if any new or changing lesions are noted between office visits    ACTINIC DAMAGE - Chronic condition, secondary to cumulative UV/sun exposure - diffuse scaly erythematous macules with underlying dyspigmentation - Recommend daily broad spectrum sunscreen SPF 30+ to sun-exposed areas, reapply every 2 hours as needed.  - Staying in the shade or wearing long sleeves, sun glasses (UVA+UVB protection) and wide brim hats (4-inch brim around the entire circumference of the hat) are also recommended for sun protection.  - Call for new or changing lesions.  LENTIGINES, SEBORRHEIC KERATOSES, HEMANGIOMAS - Benign normal skin lesions - Benign-appearing - Call for any changes  MELANOCYTIC NEVI - Tan-brown and/or pink-flesh-colored symmetric macules and papules - Benign appearing on exam today - Observation - Call clinic for new or changing moles - Recommend daily use of broad spectrum spf 30+ sunscreen to sun-exposed areas.    EXCORIATIONS Exam: Excoriations at upper back  Treatment Plan: Recommend Vaseline/moisturizer. Call if not resolving.     SCHAMBERG'S PURPURA B/L lower legs The patient will observe these symptoms, and report promptly any worsening or unexpected persistence.  If well, may return prn.  INFLAMED SEBORRHEIC KERATOSIS (6) Back x5, L chest below nipple/areola x1 (6) Symptomatic, irritating, patient would like treated. Destruction of lesion - Back x5, L chest below nipple/areola x1 (6) Complexity: simple   Destruction method: cryotherapy   Informed consent: discussed and consent obtained   Timeout:  patient name, date of birth, surgical site, and procedure  verified Lesion destroyed using liquid nitrogen: Yes   Region frozen until ice ball extended beyond lesion: Yes   Cryo cycles: 1 or 2. Outcome: patient tolerated procedure well  with no complications   Post-procedure details: wound care instructions given   Additional details:  Prior to procedure, discussed risks of blister formation, small wound, skin dyspigmentation, or rare scar following cryotherapy. Recommend Vaseline ointment to treated areas while healing.  AK (ACTINIC KERATOSIS) (7) left superior forehead at hair line x4, right superior forehead x2, R temple x1 (7) Actinic keratoses are precancerous spots that appear secondary to cumulative UV radiation exposure/sun exposure over time. They are chronic with expected duration over 1 year. A portion of actinic keratoses will progress to squamous cell carcinoma of the skin. It is not possible to reliably predict which spots will progress to skin cancer and so treatment is recommended to prevent development of skin cancer.  Recommend daily broad spectrum sunscreen SPF 30+ to sun-exposed areas, reapply every 2 hours as needed.  Recommend staying in the shade or wearing long sleeves, sun glasses (UVA+UVB protection) and wide brim hats (4-inch brim around the entire circumference of the hat). Call for new or changing lesions. Destruction of lesion - left superior forehead at hair line x4, right superior forehead x2, R temple x1 (7) Complexity: simple   Destruction method: cryotherapy   Informed consent: discussed and consent obtained   Timeout:  patient name, date of birth, surgical site, and procedure verified Lesion destroyed using liquid nitrogen: Yes   Region frozen until ice ball extended beyond lesion: Yes   Cryo cycles: 1 or 2. Outcome: patient tolerated procedure well with no complications   Post-procedure details: wound care instructions given   Additional details:  Prior to procedure, discussed risks of blister formation, small wound, skin dyspigmentation, or rare scar following cryotherapy. Recommend Vaseline ointment to treated areas while healing.  MULTIPLE BENIGN NEVI   LENTIGINES   ACTINIC  ELASTOSIS   SEBORRHEIC KERATOSES   CHERRY ANGIOMA   EXCORIATION     Return in about 6 months (around 04/25/2024) for TBSE, Hx of SCC, Hx of AKs .  I, Lawson Radar, CMA, am acting as scribe for Elie Goody, MD.   Documentation: I have reviewed the above documentation for accuracy and completeness, and I agree with the above.  Elie Goody, MD

## 2023-10-27 NOTE — Patient Instructions (Signed)
 Cryotherapy Aftercare  Wash gently with soap and water everyday.   Apply Vaseline and Band-Aid daily until healed.   Recommend daily broad spectrum sunscreen SPF 30+ to sun-exposed areas, reapply every 2 hours as needed. Call for new or changing lesions.  Staying in the shade or wearing long sleeves, sun glasses (UVA+UVB protection) and wide brim hats (4-inch brim around the entire circumference of the hat) are also recommended for sun protection.    Melanoma ABCDEs  Melanoma is the most dangerous type of skin cancer, and is the leading cause of death from skin disease.  You are more likely to develop melanoma if you: Have light-colored skin, light-colored eyes, or red or blond hair Spend a lot of time in the sun Tan regularly, either outdoors or in a tanning bed Have had blistering sunburns, especially during childhood Have a close family member who has had a melanoma Have atypical moles or large birthmarks  Early detection of melanoma is key since treatment is typically straightforward and cure rates are extremely high if we catch it early.   The first sign of melanoma is often a change in a mole or a new dark spot.  The ABCDE system is a way of remembering the signs of melanoma.  A for asymmetry:  The two halves do not match. B for border:  The edges of the growth are irregular. C for color:  A mixture of colors are present instead of an even brown color. D for diameter:  Melanomas are usually (but not always) greater than 6mm - the size of a pencil eraser. E for evolution:  The spot keeps changing in size, shape, and color.  Please check your skin once per month between visits. You can use a small mirror in front and a large mirror behind you to keep an eye on the back side or your body.   If you see any new or changing lesions before your next follow-up, please call to schedule a visit.  Please continue daily skin protection including broad spectrum sunscreen SPF 30+ to  sun-exposed areas, reapplying every 2 hours as needed when you're outdoors.   Staying in the shade or wearing long sleeves, sun glasses (UVA+UVB protection) and wide brim hats (4-inch brim around the entire circumference of the hat) are also recommended for sun protection.     Due to recent changes in healthcare laws, you may see results of your pathology and/or laboratory studies on MyChart before the doctors have had a chance to review them. We understand that in some cases there may be results that are confusing or concerning to you. Please understand that not all results are received at the same time and often the doctors may need to interpret multiple results in order to provide you with the best plan of care or course of treatment. Therefore, we ask that you please give [place] 2 business days to thoroughly review all your results before contacting the office for clarification. Should we see a critical lab result, you will be contacted sooner.   If You Need Anything After Your Visit  If you have any questions or concerns for your doctor, please call our main line at [phone] and press option 4 to reach your doctor's medical assistant. If no one answers, please leave a voicemail as directed and we will return your call as soon as possible. Messages left after 4 pm will be answered the following business day.   You may also send [place] a message via  MyChart. We typically respond to MyChart messages within 1-2 business days.  For prescription refills, please ask your pharmacy to contact our office. Our fax number is [phone].  If you have an urgent issue when the clinic is closed that cannot wait until the next business day, you can page your doctor at the number below.    Please note that while we do our best to be available for urgent issues outside of office hours, we are not available 24/7.   If you have an urgent issue and are unable to reach [place], you may choose to seek medical care at your  doctor's office, retail clinic, urgent care center, or emergency room.  If you have a medical emergency, please immediately call 911 or go to the emergency department.  Pager Numbers  - Dr. Gwen Pounds: [phone]  - Dr. Roseanne Reno: [phone]  - Dr. Katrinka Blazing: [phone]   In the event of inclement weather, please call our main line at [phone] for an update on the status of any delays or closures.  Dermatology Medication Tips: Please keep the boxes that topical medications come in in order to help keep track of the instructions about where and how to use these. Pharmacies typically print the medication instructions only on the boxes and not directly on the medication tubes.   If your medication is too expensive, please contact our office at [phone] option 4 or send [place] a message through MyChart.   We are unable to tell what your co-pay for medications will be in advance as this is different depending on your insurance coverage. However, we may be able to find a substitute medication at lower cost or fill out paperwork to get insurance to cover a needed medication.   If a prior authorization is required to get your medication covered by your insurance company, please allow [place] 1-2 business days to complete this process.  Drug prices often vary depending on where the prescription is filled and some pharmacies may offer cheaper prices.  The website www.goodrx.com contains coupons for medications through different pharmacies. The prices here do not account for what the cost may be with help from insurance (it may be cheaper with your insurance), but the website can give you the price if you did not use any insurance.  - You can print the associated coupon and take it with your prescription to the pharmacy.  - You may also stop by our office during regular business hours and pick up a GoodRx coupon card.  - If you need your prescription sent electronically to a different pharmacy, notify  our office through [facility] or by phone at [phone] option 4.     Si Usted Necesita Algo Despus de Su Visita  Tambin puede enviarnos un mensaje a travs de Clinical cytogeneticist. Por lo general respondemos a los mensajes de MyChart en el transcurso de 1 a 2 das hbiles.  Para renovar recetas, por favor pida a su farmacia que se ponga en contacto con nuestra oficina. Annie Sable de fax es [place] [phone].  Si tiene un asunto urgente cuando la clnica est cerrada y que no puede esperar hasta el siguiente da hbil, puede llamar/localizar a su doctor(a) al nmero que aparece a continuacin.   Por favor, tenga en cuenta que aunque hacemos todo lo posible para estar disponibles para asuntos urgentes fuera del horario de [place], no estamos disponibles las 24 horas del da, los 7 [address]  [address] de la [place].   Si tiene un problema urgente y  no puede comunicarse con nosotros, puede optar por buscar atencin mdica  en el consultorio de su doctor(a), en una clnica privada, en un centro de atencin urgente o en una sala de emergencias.  Si tiene Engineer, drilling, por favor llame inmediatamente al 911 o vaya a la sala de emergencias.  Nmeros de bper  - Dr. Gwen Pounds: [phone]  - Dra. Roseanne Reno: [phone]  - Dr. Katrinka Blazing: [phone]   En caso de inclemencias del tiempo, por favor llame a Lacy Duverney principal al [phone] para una actualizacin sobre el [place] de cualquier retraso o cierre.  Consejos para la medicacin en dermatologa: Por favor, guarde las cajas en las que vienen los medicamentos de uso tpico para ayudarle a seguir las instrucciones sobre dnde y cmo usarlos. Las farmacias generalmente imprimen las instrucciones del medicamento slo en las cajas y no directamente en los tubos del [place].   Si su medicamento es muy caro, por favor, pngase en contacto con Rolm Gala llamando al [phone] y presione la opcin 4 o envenos un mensaje a travs de  Clinical cytogeneticist.   No podemos decirle cul ser su copago por los medicamentos por adelantado ya que esto es diferente dependiendo de la cobertura de su seguro. Sin embargo, es posible que podamos encontrar un medicamento sustituto a Audiological scientist un formulario para que el seguro cubra el medicamento que se considera necesario.   Si se requiere una autorizacin previa para que su compaa de seguros [place] su medicamento, por favor permtanos de 1 a 2 das hbiles para completar [address]  Los precios de los medicamentos varan con frecuencia dependiendo del Environmental consultant de dnde se surte la receta y alguna farmacias pueden ofrecer precios ms baratos.  El sitio web www.goodrx.com tiene cupones para medicamentos de Health and safety inspector. Los precios aqu no tienen en cuenta lo que podra costar con la ayuda del seguro (puede ser ms barato con su seguro), pero el sitio web puede darle el precio si no utiliz Tourist information centre manager.  - Puede imprimir el cupn correspondiente y llevarlo con su receta a la farmacia.  - Tambin puede pasar por nuestra oficina durante el horario de atencin regular y Education officer, museum una tarjeta de cupones de GoodRx.  - Si necesita que su receta se enve electrnicamente a una farmacia diferente, informe a nuestra oficina a travs de MyChart de [place] o por telfono llamando al [phone] y presione la opcin 4.

## 2023-10-29 DIAGNOSIS — M7502 Adhesive capsulitis of left shoulder: Secondary | ICD-10-CM | POA: Diagnosis not present

## 2023-10-29 DIAGNOSIS — M25512 Pain in left shoulder: Secondary | ICD-10-CM | POA: Diagnosis not present

## 2023-11-02 ENCOUNTER — Encounter: Payer: Self-pay | Admitting: Internal Medicine

## 2023-11-02 ENCOUNTER — Other Ambulatory Visit: Payer: Self-pay | Admitting: Internal Medicine

## 2023-11-02 DIAGNOSIS — J181 Lobar pneumonia, unspecified organism: Secondary | ICD-10-CM

## 2023-11-03 DIAGNOSIS — M25512 Pain in left shoulder: Secondary | ICD-10-CM | POA: Diagnosis not present

## 2023-11-03 DIAGNOSIS — M7502 Adhesive capsulitis of left shoulder: Secondary | ICD-10-CM | POA: Diagnosis not present

## 2023-11-05 DIAGNOSIS — M7502 Adhesive capsulitis of left shoulder: Secondary | ICD-10-CM | POA: Diagnosis not present

## 2023-11-05 DIAGNOSIS — M25512 Pain in left shoulder: Secondary | ICD-10-CM | POA: Diagnosis not present

## 2023-11-09 ENCOUNTER — Ambulatory Visit (INDEPENDENT_AMBULATORY_CARE_PROVIDER_SITE_OTHER): Payer: Medicare Other | Admitting: Podiatry

## 2023-11-09 ENCOUNTER — Ambulatory Visit (INDEPENDENT_AMBULATORY_CARE_PROVIDER_SITE_OTHER)

## 2023-11-09 ENCOUNTER — Encounter: Payer: Self-pay | Admitting: Podiatry

## 2023-11-09 DIAGNOSIS — M7751 Other enthesopathy of right foot: Secondary | ICD-10-CM

## 2023-11-09 DIAGNOSIS — M7752 Other enthesopathy of left foot: Secondary | ICD-10-CM

## 2023-11-09 DIAGNOSIS — B351 Tinea unguium: Secondary | ICD-10-CM | POA: Diagnosis not present

## 2023-11-10 DIAGNOSIS — M25512 Pain in left shoulder: Secondary | ICD-10-CM | POA: Diagnosis not present

## 2023-11-10 DIAGNOSIS — M7502 Adhesive capsulitis of left shoulder: Secondary | ICD-10-CM | POA: Diagnosis not present

## 2023-11-10 NOTE — Progress Notes (Signed)
 He presents today after multiple laser treatments to the hallux nails.  He is also increased his walking and has developed pain in the anterior ankle left and around the metatarsophalangeal joints.  He states that he went from a mile mile and 1/2-2 to 2-1/2 miles at a brisk pace.  Objective: Pulses are palpable bilateral there is some joint effusion on palpation of the anterior ankle left radiographs confirmed this as well.  Radiographs do not confirm any type of osseous abnormalities around the first metatarsal phalangeal joint consistent with osteoarthritic change.  His nail plates look much better than they did prior to the laser treatment.  No pain with range of motion of the first metatarsophalangeal joints.  Assessment: Capsulitis left ankle.  Capsulitis first metatarsophalangeal joints bilateral  Plan: Offered him injections discussed topical anti-inflammatories such as diclofenac/Voltaren cream.  Also discussed continuing laser therapy as a maintenance.

## 2023-11-13 ENCOUNTER — Telehealth: Payer: Self-pay

## 2023-11-13 DIAGNOSIS — M7502 Adhesive capsulitis of left shoulder: Secondary | ICD-10-CM | POA: Diagnosis not present

## 2023-11-13 DIAGNOSIS — M25512 Pain in left shoulder: Secondary | ICD-10-CM | POA: Diagnosis not present

## 2023-11-13 MED ORDER — BENZONATATE 200 MG PO CAPS: 200.0000 mg | ORAL_CAPSULE | Freq: Three times a day (TID) | ORAL | 0 refills | Status: DC | PRN

## 2023-11-13 MED ORDER — AZITHROMYCIN 250 MG PO TABS
ORAL_TABLET | ORAL | 0 refills | Status: DC
Start: 2023-11-13 — End: 2024-02-23

## 2023-11-13 NOTE — Addendum Note (Signed)
 Addended by: Tillman Abide I on: 11/13/2023 02:26 PM   Modules accepted: Orders

## 2023-11-13 NOTE — Telephone Encounter (Signed)
 Spoke to pt

## 2023-11-13 NOTE — Telephone Encounter (Signed)
 Please let him know that I sent the prescriptions. If he isn't better next week, he should come back in for me to recheck

## 2023-11-13 NOTE — Telephone Encounter (Signed)
 Copied from CRM [id number]. Topic: General - Other >> Nov 13, 2023 10:11 AM [place] A wrote: Reason for CRM: Patient would like for Dr.Lekvak to write a script for [employer] and Z-Pack. Patient is coughing continually and can not rest during bedtime

## 2023-11-17 DIAGNOSIS — M25512 Pain in left shoulder: Secondary | ICD-10-CM | POA: Diagnosis not present

## 2023-11-17 DIAGNOSIS — M7502 Adhesive capsulitis of left shoulder: Secondary | ICD-10-CM | POA: Diagnosis not present

## 2023-11-18 NOTE — Telephone Encounter (Signed)
 Copied from CRM [id number]. Topic: General - Other >> Nov 18, 2023 10:33 AM Alcus Dad wrote: Reason for CRM: Patient is better, coughing less but still coughing... what would you like patient to do?

## 2023-11-18 NOTE — Telephone Encounter (Signed)
 Spoke to pt. He said he is going to take Mucinex for the next week.

## 2023-11-20 DIAGNOSIS — M25512 Pain in left shoulder: Secondary | ICD-10-CM | POA: Diagnosis not present

## 2023-11-20 DIAGNOSIS — M7502 Adhesive capsulitis of left shoulder: Secondary | ICD-10-CM | POA: Diagnosis not present

## 2023-11-24 DIAGNOSIS — M25512 Pain in left shoulder: Secondary | ICD-10-CM | POA: Diagnosis not present

## 2023-11-24 DIAGNOSIS — M7502 Adhesive capsulitis of left shoulder: Secondary | ICD-10-CM | POA: Diagnosis not present

## 2023-11-26 DIAGNOSIS — M25512 Pain in left shoulder: Secondary | ICD-10-CM | POA: Diagnosis not present

## 2023-11-26 DIAGNOSIS — M7502 Adhesive capsulitis of left shoulder: Secondary | ICD-10-CM | POA: Diagnosis not present

## 2023-11-30 DIAGNOSIS — H43813 Vitreous degeneration, bilateral: Secondary | ICD-10-CM | POA: Diagnosis not present

## 2023-11-30 DIAGNOSIS — Z961 Presence of intraocular lens: Secondary | ICD-10-CM | POA: Diagnosis not present

## 2023-12-01 DIAGNOSIS — M7502 Adhesive capsulitis of left shoulder: Secondary | ICD-10-CM | POA: Diagnosis not present

## 2023-12-01 DIAGNOSIS — M25512 Pain in left shoulder: Secondary | ICD-10-CM | POA: Diagnosis not present

## 2023-12-03 ENCOUNTER — Ambulatory Visit
Admission: RE | Admit: 2023-12-03 | Discharge: 2023-12-03 | Disposition: A | Discharge status: Home / Self Care | Attending: Internal Medicine | Admitting: Internal Medicine

## 2023-12-03 ENCOUNTER — Encounter: Payer: Self-pay | Admitting: Internal Medicine

## 2023-12-03 ENCOUNTER — Ambulatory Visit
Admission: RE | Admit: 2023-12-03 | Discharge: 2023-12-03 | Disposition: A | Discharge status: Home / Self Care | Source: Ambulatory Visit | Attending: Internal Medicine | Admitting: Internal Medicine

## 2023-12-03 DIAGNOSIS — M25512 Pain in left shoulder: Secondary | ICD-10-CM | POA: Diagnosis not present

## 2023-12-03 DIAGNOSIS — M7502 Adhesive capsulitis of left shoulder: Secondary | ICD-10-CM | POA: Diagnosis not present

## 2023-12-03 DIAGNOSIS — J181 Lobar pneumonia, unspecified organism: Secondary | ICD-10-CM | POA: Insufficient documentation

## 2023-12-03 DIAGNOSIS — J189 Pneumonia, unspecified organism: Secondary | ICD-10-CM | POA: Diagnosis not present

## 2023-12-03 DIAGNOSIS — J9811 Atelectasis: Secondary | ICD-10-CM | POA: Diagnosis not present

## 2023-12-04 ENCOUNTER — Other Ambulatory Visit: Payer: Self-pay

## 2023-12-04 DIAGNOSIS — N3943 Post-void dribbling: Secondary | ICD-10-CM

## 2023-12-07 ENCOUNTER — Telehealth: Payer: Self-pay | Admitting: *Deleted

## 2023-12-07 NOTE — Telephone Encounter (Signed)
 Patient called and asked if Dr. Smith Robert will look at his imaging of lungs from from where Dr. Alphonsus Sias does his image of lungs and he has pneumonia and his pcp  took care of the the issue . The pt. wants her to look at them also. He wants to know if any issue she may see . And wants to know the next 1 year to get CT scan yearly and let him know. The image is in our system. I do not see anything for next visit

## 2023-12-08 ENCOUNTER — Other Ambulatory Visit
Admission: RE | Admit: 2023-12-08 | Discharge: 2023-12-08 | Disposition: A | Discharge status: Home / Self Care | Attending: Urology | Admitting: Urology

## 2023-12-08 ENCOUNTER — Other Ambulatory Visit: Payer: Self-pay | Admitting: Internal Medicine

## 2023-12-08 ENCOUNTER — Encounter: Payer: Self-pay | Admitting: Urology

## 2023-12-08 ENCOUNTER — Ambulatory Visit (INDEPENDENT_AMBULATORY_CARE_PROVIDER_SITE_OTHER): Admitting: Urology

## 2023-12-08 VITALS — BP 114/72 | Ht 73.0 in | Wt 196.0 lb

## 2023-12-08 DIAGNOSIS — M25512 Pain in left shoulder: Secondary | ICD-10-CM | POA: Diagnosis not present

## 2023-12-08 DIAGNOSIS — N3943 Post-void dribbling: Secondary | ICD-10-CM | POA: Diagnosis not present

## 2023-12-08 DIAGNOSIS — N529 Male erectile dysfunction, unspecified: Secondary | ICD-10-CM

## 2023-12-08 DIAGNOSIS — M7502 Adhesive capsulitis of left shoulder: Secondary | ICD-10-CM | POA: Diagnosis not present

## 2023-12-08 DIAGNOSIS — N481 Balanitis: Secondary | ICD-10-CM

## 2023-12-08 LAB — URINALYSIS, COMPLETE (UACMP) WITH MICROSCOPIC
Bilirubin Urine: NEGATIVE
Glucose, UA: NEGATIVE mg/dL
Ketones, ur: NEGATIVE mg/dL
Leukocytes,Ua: NEGATIVE
Nitrite: NEGATIVE
Protein, ur: NEGATIVE mg/dL
Specific Gravity, Urine: 1.01 (ref 1.005–1.030)
pH: 5.5 (ref 5.0–8.0)

## 2023-12-08 LAB — BLADDER SCAN AMB NON-IMAGING: Scan Result: 0

## 2023-12-08 MED ORDER — CLOTRIMAZOLE-BETAMETHASONE 1-0.05 % EX CREA
1.0000 | TOPICAL_CREAM | Freq: Two times a day (BID) | CUTANEOUS | 0 refills | Status: AC
Start: 2023-12-08 — End: ?

## 2023-12-08 NOTE — Progress Notes (Signed)
 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  He reports at least a few weeks of a small lesion on the glans of the penis that he wanted to have evaluated.  He does not have any pain or symptoms.  He has some mild postvoid dribbling that is minimally bothersome.  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  He had questions about PSA screening  Urinalysis today is benign, PVR normal at 0ml.  PSA was normal at 0.59 in February 2021.  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    Surgical History: Past Surgical History:  Procedure Laterality Date   CATARACT EXTRACTION W/PHACO Left 04/01/2022   Procedure: CATARACT EXTRACTION PHACO AND INTRAOCULAR LENS PLACEMENT (IOC) LEFT VIVITY TORIC;  Surgeon: Galen Manila, MD;  Location: [facility];  Service: Ophthalmology;  Laterality: Left;  7.53 00:41.3   CATARACT EXTRACTION W/PHACO Right 04/15/2022   Procedure: CATARACT EXTRACTION PHACO AND INTRAOCULAR LENS PLACEMENT (IOC) RIGHT VIVITY TORIC;  Surgeon: Galen Manila, MD;  [facility];  Service: Ophthalmology;  Laterality: Right;  5.35 00:36.3   CHOLECYSTECTOMY     COLONOSCOPY     COLONOSCOPY WITH PROPOFOL N/A 11/30/2019   Procedure: COLONOSCOPY WITH PROPOFOL;  Surgeon: Toney Reil, MD;  Location[facility];  Service: Gastroenterology;  Laterality: N/A;   EYE SURGERY     vitreous detachment---laser   KNEE CARTILAGE SURGERY Right 1968   KNEE SURGERY      bilateral meniscal surgeries   LUNG LOBECTOMY     upper right lung 12/15/2007    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 He has never been exposed to tobacco smoke. He has never used smokeless tobacco. He reports current alcohol use. He reports that he does not use drugs.  Physical Exam: BP 114/72   Ht 6\' 1"  (1.854 m)   Wt 196 lb (88.9 kg)   BMI 25.86 kg/m    Constitutional:  Alert and oriented, No acute distress. Cardiovascular: No clubbing, cyanosis, or edema. Respiratory: Normal respiratory effort, no increased work of breathing. GI: Abdomen is soft, nontender, nondistended, no abdominal masses GU: Phallus with patent meatus, very subtle <1cm scaly patch at the dorsal aspect of the phallus that may represent a mild yeast infection  Assessment & Plan:   83 year old male with possible subtle yeast infection of the glans, ED well-controlled on sildenafil, mild postvoid dribbling that is minimally bothersome, and questions about PSA screening.  Reassurance provided regarding normal PSA value of 0.59 in February 2021, no further screening needed.  He is not bothered enough by urinary symptoms to consider  medications like Flomax.  I recommended a trial of Lotrisone cream for subtle lesion on the glans.  Can follow-up with urology if symptoms resolve  Legrand Rams, MD 12/08/2023  [facility] [address], [address] [place], [place] [address] [phone]

## 2023-12-09 ENCOUNTER — Encounter: Payer: Self-pay | Admitting: Internal Medicine

## 2023-12-09 DIAGNOSIS — R053 Chronic cough: Secondary | ICD-10-CM

## 2023-12-10 ENCOUNTER — Telehealth: Payer: Self-pay

## 2023-12-10 DIAGNOSIS — M7502 Adhesive capsulitis of left shoulder: Secondary | ICD-10-CM | POA: Diagnosis not present

## 2023-12-10 DIAGNOSIS — M25512 Pain in left shoulder: Secondary | ICD-10-CM | POA: Diagnosis not present

## 2023-12-10 NOTE — Telephone Encounter (Signed)
 Per Dr. Smith Robert "I looked at his CXR which does not show anything concerning at this point. I dont see areason to get another CT scan this year since his lung cancer was in 2009 and we are now 15 years out of diagnosis. If insurance will cover and he wants one, Dr.Letvak can do it this year and refer to [place] if there is anything concerning going on. I dont need to see him at this time".  Outbound call to patient; informed of above.  Mentioned he's had a course of walking pneumonia for the past 8 weeks; he took doxycycline and two Z packs.  Was told his pneumonia has improved enough and does not need a follow up film at this time.  Patient verbalized understanding.

## 2023-12-10 NOTE — Telephone Encounter (Signed)
 Mercedes-I looked at his CXR which does not show anything concerning at this point. I dont see areason to get another CT scan this year since his lung cancer was in 2009 and we are now 15 years out of diagnosis. If insurance will cover and he wants one, Dr.Letvak can do it this year and refer to [place] if there is anything concerning going on. I dont need to see him at this time  DR. Letvak-FYI.

## 2023-12-11 ENCOUNTER — Encounter: Payer: Self-pay | Admitting: Internal Medicine

## 2023-12-11 DIAGNOSIS — Z23 Encounter for immunization: Secondary | ICD-10-CM | POA: Diagnosis not present

## 2023-12-11 DIAGNOSIS — U071 COVID-19: Secondary | ICD-10-CM

## 2023-12-11 MED ORDER — ALBUTEROL SULFATE HFA 108 (90 BASE) MCG/ACT IN AERS: 1.0000 | INHALATION_SPRAY | Freq: Four times a day (QID) | RESPIRATORY_TRACT | 0 refills | Status: DC | PRN

## 2023-12-15 DIAGNOSIS — M7502 Adhesive capsulitis of left shoulder: Secondary | ICD-10-CM | POA: Diagnosis not present

## 2023-12-15 DIAGNOSIS — M25512 Pain in left shoulder: Secondary | ICD-10-CM | POA: Diagnosis not present

## 2023-12-17 DIAGNOSIS — M25512 Pain in left shoulder: Secondary | ICD-10-CM | POA: Diagnosis not present

## 2023-12-17 DIAGNOSIS — M7502 Adhesive capsulitis of left shoulder: Secondary | ICD-10-CM | POA: Diagnosis not present

## 2023-12-18 MED ORDER — PREDNISONE 20 MG PO TABS: 40.0000 mg | ORAL_TABLET | Freq: Every day | ORAL | 0 refills | Status: DC

## 2023-12-22 DIAGNOSIS — M25512 Pain in left shoulder: Secondary | ICD-10-CM | POA: Diagnosis not present

## 2023-12-22 DIAGNOSIS — M7502 Adhesive capsulitis of left shoulder: Secondary | ICD-10-CM | POA: Diagnosis not present

## 2023-12-24 DIAGNOSIS — M7502 Adhesive capsulitis of left shoulder: Secondary | ICD-10-CM | POA: Diagnosis not present

## 2023-12-24 DIAGNOSIS — M25512 Pain in left shoulder: Secondary | ICD-10-CM | POA: Diagnosis not present

## 2023-12-28 DIAGNOSIS — M25512 Pain in left shoulder: Secondary | ICD-10-CM | POA: Diagnosis not present

## 2023-12-28 DIAGNOSIS — M7502 Adhesive capsulitis of left shoulder: Secondary | ICD-10-CM | POA: Diagnosis not present

## 2023-12-31 DIAGNOSIS — M25512 Pain in left shoulder: Secondary | ICD-10-CM | POA: Diagnosis not present

## 2023-12-31 DIAGNOSIS — M7502 Adhesive capsulitis of left shoulder: Secondary | ICD-10-CM | POA: Diagnosis not present

## 2024-01-01 ENCOUNTER — Other Ambulatory Visit: Payer: Medicare Other

## 2024-01-01 ENCOUNTER — Other Ambulatory Visit: Payer: Self-pay | Admitting: Internal Medicine

## 2024-01-05 DIAGNOSIS — M7502 Adhesive capsulitis of left shoulder: Secondary | ICD-10-CM | POA: Diagnosis not present

## 2024-01-05 DIAGNOSIS — M25512 Pain in left shoulder: Secondary | ICD-10-CM | POA: Diagnosis not present

## 2024-01-07 DIAGNOSIS — M7502 Adhesive capsulitis of left shoulder: Secondary | ICD-10-CM | POA: Diagnosis not present

## 2024-01-07 DIAGNOSIS — M25512 Pain in left shoulder: Secondary | ICD-10-CM | POA: Diagnosis not present

## 2024-01-08 ENCOUNTER — Other Ambulatory Visit: Payer: Medicare Other

## 2024-01-12 DIAGNOSIS — M7502 Adhesive capsulitis of left shoulder: Secondary | ICD-10-CM | POA: Diagnosis not present

## 2024-01-12 DIAGNOSIS — M25512 Pain in left shoulder: Secondary | ICD-10-CM | POA: Diagnosis not present

## 2024-01-14 DIAGNOSIS — M7502 Adhesive capsulitis of left shoulder: Secondary | ICD-10-CM | POA: Diagnosis not present

## 2024-01-14 DIAGNOSIS — M25512 Pain in left shoulder: Secondary | ICD-10-CM | POA: Diagnosis not present

## 2024-01-15 ENCOUNTER — Ambulatory Visit: Admitting: Student in an Organized Health Care Education/Training Program

## 2024-01-15 ENCOUNTER — Encounter: Payer: Self-pay | Admitting: Student in an Organized Health Care Education/Training Program

## 2024-01-15 VITALS — BP 110/78 | HR 77 | Temp 97.1°F | Ht 73.0 in | Wt 189.2 lb

## 2024-01-15 DIAGNOSIS — K219 Gastro-esophageal reflux disease without esophagitis: Secondary | ICD-10-CM | POA: Diagnosis not present

## 2024-01-15 DIAGNOSIS — R053 Chronic cough: Secondary | ICD-10-CM

## 2024-01-15 DIAGNOSIS — J301 Allergic rhinitis due to pollen: Secondary | ICD-10-CM

## 2024-01-15 NOTE — Progress Notes (Signed)
 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  He does not have any risk factors for COPD nor has he any report of asthma in the past.  While the cough is improved, it persists.  The differential for the cough is quite broad and includes reactive airway disease such as asthma, reflux history cough, and upper airway cough syndrome. I will obtain a pulmonary function test to assess for any obstructive lung disease as well as a swallowing study to evaluate for any overt reflux.  We will also provide Mr. Kissel with information about reflux and how to mitigate against it.  Furthermore, given his history of previously treated and resected lung malignancy, his x-rays will always show some form of an abnormality. Given this, I will obtain a CT scan of the chest to further evaluate for any parenchymal abnormalities that could be contributing to his cough.  - Pulmonary Function Test; Future - DG ESOPHAGUS W DOUBLE CM (HD); Future - CT SUPER D CHEST WO CONTRAST; Future   Return in about 4 weeks (around 02/12/2024).   I spent 60 minutes caring for this patient today, including preparing to see the patient, obtaining a medical history , reviewing a separately obtained history, performing a medically appropriate examination and/or evaluation, counseling and educating the patient/family/caregiver, ordering medications, tests, or procedures, documenting clinical information in the electronic health record, and independently interpreting results (not separately reported/billed) and communicating results to the patient/family/caregiver   Vergia Glasgow, MD [facility]   End of visit medications:  No orders of the defined types were placed in this encounter.    Current Outpatient Medications:    albuterol  (VENTOLIN  HFA) 108 (90 Base) MCG/ACT inhaler, Inhale 1-2 puffs into the lungs every 6 (six) hours as needed., Disp: 8 g, Rfl: 0   aspirin  EC 81 MG tablet, Take 1 tablet (81 mg total) by mouth daily. Swallow whole., Disp: , Rfl:    benzonatate  (TESSALON ) 200 MG capsule, Take 1 capsule (200 mg total) by mouth 3 (three) times daily as needed for cough., Disp: 60 capsule, Rfl: 0   cetirizine (ZYRTEC) 10 MG tablet, Take 10 mg by mouth daily., Disp: , Rfl:    clotrimazole -betamethasone  (LOTRISONE ) cream, Apply 1 Application topically 2 (two) times daily., Disp: 30 g, Rfl: 0   diclofenac  Sodium (VOLTAREN ) 1 % GEL, APPLY FOUR GRAMS TO BOTH KNEES FOUR TIMES DAILY AS NEEDED, Disp: 600 g, Rfl: 4   famotidine  (PEPCID ) 20 MG tablet, TAKE ONE TABLET BY MOUTH EVERY NIGHT AT BEDTIME, Disp: 90 tablet, Rfl: 3   fexofenadine (ALLEGRA) 180 MG tablet, Take 180 mg by mouth daily., Disp: , Rfl:    fluticasone  (FLONASE ) 50 MCG/ACT nasal spray, Place 1 spray into both nostrils 2 (two) times daily., Disp: , Rfl:    gabapentin  (NEURONTIN ) 300 MG capsule, TAKE ONE CAPSULE BY MOUTH TWICE DAILY, Disp: 180 capsule, Rfl: 3   glucosamine-chondroitin 500-400 MG tablet, Take 1 tablet by mouth 3 (three) times daily., Disp: , Rfl:    HYDROcodone  bit-homatropine (HYCODAN) 5-1.5 MG/5ML syrup, Take 5 mLs by mouth at bedtime as needed for cough., Disp: 120 mL, Rfl: 0  ketoconazole (NIZORAL) 2 % cream, APPLY A SMALL AMOUNT TO AFFECTED AREA TWICE DAILY, Disp: 60 g, Rfl: 3   levothyroxine  (SYNTHROID ) 50 MCG tablet, TAKE ONE TABLET BY MOUTH ONCE A DAY BEFORE BREAKFAST., Disp: 90 tablet, Rfl: 3   Liniments (DEEP BLUE RELIEF) GEL, Apply topically., Disp: , Rfl:    meloxicam  (MOBIC ) 15 MG tablet, TAKE ONE TABLET BY MOUTH DAILY AS NEEDED, Disp: 90 tablet,  Rfl: 0   montelukast  (SINGULAIR ) 10 MG tablet, Take 1 tablet (10 mg total) by mouth at bedtime., Disp: 90 tablet, Rfl: 3   Multiple Vitamin (MULTIVITAMIN WITH MINERALS) TABS tablet, Take 1 tablet by mouth daily., Disp: , Rfl:    mupirocin  ointment (BACTROBAN ) 2 %, Apply twice daily as needed, Disp: 22 g, Rfl: 1   Probiotic Product (PROBIOTIC PO), Take 1 capsule by mouth 2 (two) times daily., Disp: , Rfl:    rosuvastatin  (CRESTOR ) 10 MG tablet, Take 1 tablet (10 mg total) by mouth daily., Disp: 90 tablet, Rfl: 3   sildenafil  (VIAGRA ) 100 MG tablet, TAKE ONE TABLET BY MOUTH ONCE A DAY AS NEEDED FOR ERECTILE DYSFUNCTION., Disp: 10 tablet, Rfl: 11   triamcinolone  cream (KENALOG ) 0.1 %, APPLY TO HANDS AT BEDTIME AND COVER WITH GLOVES AS NEEDED FOR FLARES. AVOID FACE, GROIN, AXILLA., Disp: 80 g, Rfl: 0   azithromycin  (ZITHROMAX  Z-PAK) 250 MG tablet, Take 2 tablets (500 mg) on  Day 1,  followed by 1 tablet (250 mg) once daily on Days 2 through 5. (Patient not taking: Reported on 01/15/2024), Disp: 6 each, Rfl: 0   predniSONE  (DELTASONE ) 20 MG tablet, Take 2 tablets (40 mg total) by mouth daily. For 3 days-then 1 tab daily for 3 days (Patient not taking: Reported on 01/15/2024), Disp: 9 tablet, Rfl: 0   Subjective:   PATIENT ID: Kevin Terrell GENDER: male DOB: [birth year], MRN: [id number]  Chief Complaint  Patient presents with   Consult    Had a cold and pneumonia in Dec into Jan.     HPI  Patient is a pleasant 83 year old male presenting for the evaluation of cough.  Symptom onset was around December 2024 when he experienced an upper respiratory tract infection with the subsequent development of a possible lower respiratory tract infection.  The cough persisted and he was seen by his primary care physician for this multiple times.  He was prescribed a course of doxycycline , followed by 2 courses of azithromycin /prednisone  with improvement but not full resolution of the symptoms.  He feels 75%  improved, but continues to have this cough. The cough was productive of green sputum but this has transformed into clear/whitish sputum.  There are no other associated respiratory symptoms.  Specifically, he denies any shortness of breath, wheeze, chest pain, or chest tightness.  He has not had any fevers, chills, night sweats, or weight loss.  The cough does not worsen at night nor is it worse when he lays flat.  He has not noticed any cough after meals.  Patient had a chest x-ray in February 2025 that was reported for a multifocal infection in the right lung, repeated in April and showing resolution.  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  He then received adjuvant chemotherapy following the resection and was told that evaluation of the lymph nodes was negative.  He was told in the past that he had allergies and was prescribed cetirizine as well as montelukast .  He was also told  at some point that he had reflux disease with associated cough for which she was prescribed gabapentin .  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  He denies any history of smoking or any inhalational exposures.  He previously had dogs but currently does not.  He has not had any pet birds.  He has no personal or family history of asthma.  Ancillary information including prior medications, full medical/surgical/family/social histories, and PFTs (when available) are listed below and have been reviewed.   Review of Systems  Constitutional:  Negative for chills, fever, malaise/fatigue and weight loss.  Respiratory:  Positive for cough and sputum production. Negative for hemoptysis, shortness of breath and wheezing.   Cardiovascular:  Negative for chest pain.     Objective:   Vitals:   01/15/24 1120  BP: 110/78  Pulse: 77  Temp: (!) 97.1 F (36.2 C)  SpO2: 98%  Weight: 189 lb 3.2 oz (85.8 kg)  Height: 6\' 1"  (1.854 m)   98% on  RA  BMI Readings from Last 3 Encounters:  01/15/24 24.96 kg/m  12/08/23 25.86 kg/m  10/20/23 25.86 kg/m   Wt Readings from Last 3 Encounters:  01/15/24 189 lb 3.2 oz (85.8 kg)  12/08/23 196 lb (88.9 kg)  10/20/23 196 lb (88.9 kg)    Physical Exam Constitutional:      Appearance: Normal appearance.  Cardiovascular:     Rate and Rhythm: Normal rate and regular rhythm.     Pulses: Normal pulses.     Heart sounds: Normal heart sounds.  Pulmonary:     Effort: Pulmonary effort is normal.     Breath sounds: Normal breath sounds.  Abdominal:     Palpations: Abdomen is soft.  Neurological:     General: No focal deficit present.     Mental Status: He is alert and oriented to person, place, and time. Mental status is at baseline.       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 treated in 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   right upper arm, excised 10/31/21     Family History  Problem Relation Age of Onset   Cancer Mother    Alzheimer's disease Mother    Stroke Maternal Grandmother    Hypertension Father    Alzheimer's disease Father      Past Surgical History:  Procedure Laterality Date   CATARACT EXTRACTION W/PHACO Left 04/01/2022   Procedure: CATARACT EXTRACTION PHACO AND INTRAOCULAR LENS PLACEMENT (IOC) LEFT VIVITY TORIC;  Surgeon: Clair Crews, MD;  Location: [facility];  Service: Ophthalmology;  Laterality: Left;  7.53 00:41.3   CATARACT EXTRACTION W/PHACO Right 04/15/2022   Procedure: CATARACT EXTRACTION PHACO AND INTRAOCULAR LENS PLACEMENT (IOC) RIGHT VIVITY TORIC;  Surgeon: Clair Crews, MD;  Location: [facility];  Service: Ophthalmology;  Laterality:  Right;  5.35 00:36.3   CHOLECYSTECTOMY     COLONOSCOPY     COLONOSCOPY WITH PROPOFOL  N/A 11/30/2019   Procedure: COLONOSCOPY WITH PROPOFOL ;  Surgeon: Selena Daily, MD;  Location: [facility];  Service: Gastroenterology;  Laterality: N/A;   EYE SURGERY     vitreous detachment---laser   KNEE CARTILAGE SURGERY  Right 1968   KNEE SURGERY     bilateral meniscal surgeries   LUNG LOBECTOMY     upper right lung 12/15/2007    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   UPPER GI ENDOSCOPY      Social History   Socioeconomic History   Marital status: Widowed    Spouse name: Not on file   Number of children: 3   Years of education: Not on file   Highest education level: Professional school degree (e.g., MD, DDS, DVM, JD)  Occupational History   Occupation: Veterinarian    Comment: retired  Tobacco Use   Smoking status: Never    Passive exposure: Never   Smokeless tobacco: Never  Vaping Use   Vaping status: Never Used  Substance and Sexual Activity   Alcohol use: Yes    Comment: Once glass of wine per month, none last 24hrs   Drug use: Never   Sexual activity: Yes  Other Topics Concern   Not on file  Social History Narrative   Married   Veterinarian   Wife died 02-25-2024   Has a daughter in [place]; son and daughter in [place]     6 grandchildren   [place] from [place]; spent some time in [place]; moved here 2019.    Lives at [facility]      Has advanced directives   Daughter Isa Manuel is health care POA   Would accept resuscitation attempts   No feeding tube if cognitively unaware   Social Drivers of Health   Financial Resource Strain: Low Risk  (10/06/2023)   Overall Financial Resource Strain (CARDIA)    Difficulty of Paying Living Expenses: Not hard at all  Food Insecurity: No Food Insecurity (10/06/2023)   Hunger Vital Sign    Worried About Running Out of Food in the Last Year: Never true    Ran Out of Food in the Last Year: Never true  Transportation Needs: No  Transportation Needs (10/06/2023)   PRAPARE - Administrator, Civil Service (Medical): No    Lack of Transportation (Non-Medical): No  Physical Activity: Sufficiently Active (07/25/2023)   Exercise Vital Sign    Days of Exercise per Week: 5 days    Minutes of Exercise per Session: 60 min  Stress: No Stress Concern Present (10/06/2023)   [employer] of Occupational Health - Occupational Stress Questionnaire    Feeling of Stress : Not at all  Social Connections: Moderately Integrated (10/06/2023)   Social Connection and Isolation Panel [NHANES]    Frequency of Communication with Friends and Family: More than three times a week    Frequency of Social Gatherings with Friends and Family: More than three times a week    Attends Religious Services: More than 4 times per year    Active Member of [employer] or Organizations: Yes    Attends Banker Meetings: More than 4 times per year    Marital Status: Widowed  Intimate Partner Violence: Not At Risk (10/26/2018)   Humiliation, Afraid, Rape, and Kick questionnaire    Fear of Current or Ex-Partner: No    Emotionally Abused: No    Physically Abused: No    Sexually Abused: No     Allergies  Allergen Reactions   Compazine [Prochlorperazine Edisylate]     muscle spasmus    Okra Hives   Other     Okra - hives   Phenylbutazone Other (See Comments)   Augmentin  [Amoxicillin -Pot Clavulanate] Diarrhea    Tolerates Amoxicillin  well  CBC    Component Value Date/Time   WBC 4.2 02/20/2023 0852   RBC 4.48 02/20/2023 0852   HGB 13.6 02/20/2023 0852   HCT 41.5 02/20/2023 0852   PLT 169.0 02/20/2023 0852   MCV 92.7 02/20/2023 0852   MCH 30.5 10/18/2021 0901   MCHC 32.8 02/20/2023 0852   RDW 13.0 02/20/2023 0852   LYMPHSABS 1.5 10/18/2021 0901   MONOABS 0.9 10/18/2021 0901   EOSABS 0.2 10/18/2021 0901   BASOSABS 0.0 10/18/2021 0901    Pulmonary Functions Testing Results:     No data to display           Outpatient Medications Prior to Visit  Medication Sig Dispense Refill   albuterol  (VENTOLIN  HFA) 108 (90 Base) MCG/ACT inhaler Inhale 1-2 puffs into the lungs every 6 (six) hours as needed. 8 g 0   aspirin  EC 81 MG tablet Take 1 tablet (81 mg total) by mouth daily. Swallow whole.     benzonatate  (TESSALON ) 200 MG capsule Take 1 capsule (200 mg total) by mouth 3 (three) times daily as needed for cough. 60 capsule 0   cetirizine (ZYRTEC) 10 MG tablet Take 10 mg by mouth daily.     clotrimazole -betamethasone  (LOTRISONE ) cream Apply 1 Application topically 2 (two) times daily. 30 g 0   diclofenac  Sodium (VOLTAREN ) 1 % GEL APPLY FOUR GRAMS TO BOTH KNEES FOUR TIMES DAILY AS NEEDED 600 g 4   famotidine  (PEPCID ) 20 MG tablet TAKE ONE TABLET BY MOUTH EVERY NIGHT AT BEDTIME 90 tablet 3   fexofenadine (ALLEGRA) 180 MG tablet Take 180 mg by mouth daily.     fluticasone  (FLONASE ) 50 MCG/ACT nasal spray Place 1 spray into both nostrils 2 (two) times daily.     gabapentin  (NEURONTIN ) 300 MG capsule TAKE ONE CAPSULE BY MOUTH TWICE DAILY 180 capsule 3   glucosamine-chondroitin 500-400 MG tablet Take 1 tablet by mouth 3 (three) times daily.     HYDROcodone  bit-homatropine (HYCODAN) 5-1.5 MG/5ML syrup Take 5 mLs by mouth at bedtime as needed for cough. 120 mL 0   ketoconazole (NIZORAL) 2 % cream APPLY A SMALL AMOUNT TO AFFECTED AREA TWICE DAILY 60 g 3   levothyroxine  (SYNTHROID ) 50 MCG tablet TAKE ONE TABLET BY MOUTH ONCE A DAY BEFORE BREAKFAST. 90 tablet 3   Liniments (DEEP BLUE RELIEF) GEL Apply topically.     meloxicam  (MOBIC ) 15 MG tablet TAKE ONE TABLET BY MOUTH DAILY AS NEEDED 90 tablet 0   montelukast  (SINGULAIR ) 10 MG tablet Take 1 tablet (10 mg total) by mouth at bedtime. 90 tablet 3   Multiple Vitamin (MULTIVITAMIN WITH MINERALS) TABS tablet Take 1 tablet by mouth daily.     mupirocin  ointment (BACTROBAN ) 2 % Apply twice daily as needed 22 g 1   Probiotic Product (PROBIOTIC PO) Take 1 capsule by  mouth 2 (two) times daily.     rosuvastatin  (CRESTOR ) 10 MG tablet Take 1 tablet (10 mg total) by mouth daily. 90 tablet 3   sildenafil  (VIAGRA ) 100 MG tablet TAKE ONE TABLET BY MOUTH ONCE A DAY AS NEEDED FOR ERECTILE DYSFUNCTION. 10 tablet 11   triamcinolone  cream (KENALOG ) 0.1 % APPLY TO HANDS AT BEDTIME AND COVER WITH GLOVES AS NEEDED FOR FLARES. AVOID FACE, GROIN, AXILLA. 80 g 0   azithromycin  (ZITHROMAX  Z-PAK) 250 MG tablet Take 2 tablets (500 mg) on  Day 1,  followed by 1 tablet (250 mg) once daily on Days 2 through 5. (Patient not taking: Reported on 01/15/2024) 6 each  0   predniSONE  (DELTASONE ) 20 MG tablet Take 2 tablets (40 mg total) by mouth daily. For 3 days-then 1 tab daily for 3 days (Patient not taking: Reported on 01/15/2024) 9 tablet 0   No facility-administered medications prior to visit.

## 2024-01-18 ENCOUNTER — Ambulatory Visit: Admitting: Student in an Organized Health Care Education/Training Program

## 2024-01-19 DIAGNOSIS — M7502 Adhesive capsulitis of left shoulder: Secondary | ICD-10-CM | POA: Diagnosis not present

## 2024-01-19 DIAGNOSIS — M25512 Pain in left shoulder: Secondary | ICD-10-CM | POA: Diagnosis not present

## 2024-01-21 DIAGNOSIS — M7502 Adhesive capsulitis of left shoulder: Secondary | ICD-10-CM | POA: Diagnosis not present

## 2024-01-21 DIAGNOSIS — M25512 Pain in left shoulder: Secondary | ICD-10-CM | POA: Diagnosis not present

## 2024-01-22 ENCOUNTER — Ambulatory Visit
Admission: RE | Admit: 2024-01-22 | Discharge: 2024-01-22 | Disposition: A | Discharge status: Home / Self Care | Source: Ambulatory Visit | Attending: Student in an Organized Health Care Education/Training Program | Admitting: Student in an Organized Health Care Education/Training Program

## 2024-01-22 DIAGNOSIS — R053 Chronic cough: Secondary | ICD-10-CM | POA: Insufficient documentation

## 2024-01-22 DIAGNOSIS — D1803 Hemangioma of intra-abdominal structures: Secondary | ICD-10-CM | POA: Diagnosis not present

## 2024-01-22 DIAGNOSIS — K224 Dyskinesia of esophagus: Secondary | ICD-10-CM | POA: Diagnosis not present

## 2024-01-22 DIAGNOSIS — R911 Solitary pulmonary nodule: Secondary | ICD-10-CM | POA: Diagnosis not present

## 2024-01-22 DIAGNOSIS — J984 Other disorders of lung: Secondary | ICD-10-CM | POA: Diagnosis not present

## 2024-01-26 DIAGNOSIS — M7502 Adhesive capsulitis of left shoulder: Secondary | ICD-10-CM | POA: Diagnosis not present

## 2024-01-26 DIAGNOSIS — M25512 Pain in left shoulder: Secondary | ICD-10-CM | POA: Diagnosis not present

## 2024-01-28 DIAGNOSIS — M7502 Adhesive capsulitis of left shoulder: Secondary | ICD-10-CM | POA: Diagnosis not present

## 2024-01-28 DIAGNOSIS — M25512 Pain in left shoulder: Secondary | ICD-10-CM | POA: Diagnosis not present

## 2024-02-02 ENCOUNTER — Ambulatory Visit: Payer: Self-pay | Admitting: Student in an Organized Health Care Education/Training Program

## 2024-02-02 DIAGNOSIS — M7502 Adhesive capsulitis of left shoulder: Secondary | ICD-10-CM | POA: Diagnosis not present

## 2024-02-02 DIAGNOSIS — M25512 Pain in left shoulder: Secondary | ICD-10-CM | POA: Diagnosis not present

## 2024-02-09 DIAGNOSIS — M25512 Pain in left shoulder: Secondary | ICD-10-CM | POA: Diagnosis not present

## 2024-02-09 DIAGNOSIS — M7502 Adhesive capsulitis of left shoulder: Secondary | ICD-10-CM | POA: Diagnosis not present

## 2024-02-11 ENCOUNTER — Ambulatory Visit: Admitting: Student in an Organized Health Care Education/Training Program

## 2024-02-11 DIAGNOSIS — M25512 Pain in left shoulder: Secondary | ICD-10-CM | POA: Diagnosis not present

## 2024-02-11 DIAGNOSIS — R053 Chronic cough: Secondary | ICD-10-CM | POA: Diagnosis not present

## 2024-02-11 DIAGNOSIS — M7502 Adhesive capsulitis of left shoulder: Secondary | ICD-10-CM | POA: Diagnosis not present

## 2024-02-11 LAB — PULMONARY FUNCTION TEST
DL/VA % pred: 110 %
DL/VA: 4.17 ml/min/mmHg/L
DLCO unc % pred: 96 %
DLCO unc: 25.44 ml/min/mmHg
FEF 25-75 Post: 2.24 L/s
FEF 25-75 Pre: 1.54 L/s
FEF2575-%Change-Post: 45 %
FEF2575-%Pred-Post: 101 %
FEF2575-%Pred-Pre: 69 %
FEV1-%Change-Post: 9 %
FEV1-%Pred-Post: 87 %
FEV1-%Pred-Pre: 80 %
FEV1-Post: 2.83 L
FEV1-Pre: 2.58 L
FEV1FVC-%Change-Post: 3 %
FEV1FVC-%Pred-Pre: 95 %
FEV6-%Change-Post: 6 %
FEV6-%Pred-Post: 93 %
FEV6-%Pred-Pre: 88 %
FEV6-Post: 3.96 L
FEV6-Pre: 3.73 L
FEV6FVC-%Change-Post: 0 %
FEV6FVC-%Pred-Post: 105 %
FEV6FVC-%Pred-Pre: 104 %
FVC-%Change-Post: 5 %
FVC-%Pred-Post: 89 %
FVC-%Pred-Pre: 84 %
FVC-Post: 4.01 L
FVC-Pre: 3.81 L
Post FEV1/FVC ratio: 71 %
Post FEV6/FVC ratio: 99 %
Pre FEV1/FVC ratio: 68 %
Pre FEV6/FVC Ratio: 98 %
RV % pred: 102 %
RV: 2.93 L
TLC % pred: 85 %
TLC: 6.53 L

## 2024-02-11 NOTE — Patient Instructions (Signed)
 Full PFT completed today ? ?

## 2024-02-11 NOTE — Progress Notes (Signed)
 Full PFT completed today ? ?

## 2024-02-12 ENCOUNTER — Encounter: Payer: Self-pay | Admitting: Student in an Organized Health Care Education/Training Program

## 2024-02-12 ENCOUNTER — Other Ambulatory Visit

## 2024-02-12 ENCOUNTER — Ambulatory Visit (INDEPENDENT_AMBULATORY_CARE_PROVIDER_SITE_OTHER): Admitting: Student in an Organized Health Care Education/Training Program

## 2024-02-12 VITALS — BP 118/68 | HR 99 | Temp 97.1°F | Ht 73.0 in | Wt 189.2 lb

## 2024-02-12 DIAGNOSIS — K219 Gastro-esophageal reflux disease without esophagitis: Secondary | ICD-10-CM | POA: Diagnosis not present

## 2024-02-12 DIAGNOSIS — R053 Chronic cough: Secondary | ICD-10-CM | POA: Diagnosis not present

## 2024-02-12 DIAGNOSIS — J301 Allergic rhinitis due to pollen: Secondary | ICD-10-CM | POA: Diagnosis not present

## 2024-02-12 NOTE — Progress Notes (Signed)
 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 He has had a workup that has included PFT's, HRCT, and a swallowing study.  I have personally reviewed his PFT's which are fully within normal. While there was a 240 mL improvement in FEV1 with albuterol , he did not feel any difference in symptoms. Both pre and post bronchodilator FEV1 were within normal. FEV1/FVC ratio post bronchodilator challenge was 0.71, precluding a diagnosis of COPD. While I don't think his symptoms are explained by this mild response to bronchodilator challenge, we will consider an empiric trial of albuterol  in the future if they persist. Given improvement in cough, we will continue to manage conservatively; he will let us  know if symptoms recur prior to his follow up.  His high resolution chest CT showed mild, patchy, and peripheral ground glass opacities. This could be residual secondary to a previous infection. I doubt this represent an idiopathic interstitial pneumonia or a drug induced pneumonitis, but we will continue to monitor this with a repeat CT chest in 6 months.  Finally, swallowing study performed showed esophageal dysmotility but not reflux disease. We had previously discussed reflux disease mitigation and information was provided during his previous visit.    Finally, we repeat CT did not show any sign of recurrence of malignancy.   - CT CHEST HIGH RESOLUTION; in 6 months   Return in about 6 months (around 08/13/2024).  I spent 30 minutes caring for this patient today, including preparing to see the patient, obtaining a medical history , reviewing a separately obtained history, performing a medically appropriate examination and/or evaluation, counseling and educating the patient/family/caregiver, ordering  medications, tests, or procedures, documenting clinical information in the electronic health record, and independently interpreting results (not separately reported/billed) and communicating results to the patient/family/caregiver  Kevin Glasgow, MD [facility]   End of visit medications:  No orders of the defined types were placed in this encounter.    Current Outpatient Medications:    albuterol  (VENTOLIN  HFA) 108 (90 Base) MCG/ACT inhaler, Inhale 1-2 puffs into the lungs every 6 (six) hours as needed., Disp: 8 g, Rfl: 0   aspirin  EC 81 MG tablet, Take 1 tablet (81 mg total) by mouth daily. Swallow whole., Disp: , Rfl:    benzonatate  (TESSALON ) 200 MG capsule, Take 1 capsule (200 mg total) by mouth 3 (three) times daily as needed for cough., Disp: 60 capsule, Rfl: 0   cetirizine (ZYRTEC) 10 MG tablet, Take 10 mg by mouth daily., Disp: , Rfl:    clotrimazole -betamethasone  (LOTRISONE ) cream, Apply 1 Application topically 2 (two) times daily., Disp: 30 g, Rfl: 0   diclofenac  Sodium (VOLTAREN ) 1 % GEL, APPLY FOUR GRAMS TO BOTH KNEES FOUR TIMES DAILY AS NEEDED, Disp: 600 g, Rfl: 4   famotidine  (PEPCID ) 20 MG tablet, TAKE ONE TABLET BY MOUTH EVERY NIGHT AT BEDTIME, Disp: 90 tablet, Rfl: 3   fexofenadine (ALLEGRA) 180 MG tablet, Take 180 mg by mouth daily., Disp: , Rfl:    fluticasone  (FLONASE ) 50 MCG/ACT nasal spray, Place 1 spray into both nostrils 2 (two) times daily., Disp: , Rfl:    gabapentin  (NEURONTIN ) 300 MG capsule, TAKE ONE CAPSULE BY MOUTH TWICE DAILY, Disp: 180 capsule, Rfl: 3   glucosamine-chondroitin 500-400 MG tablet, Take 1 tablet by mouth 3 (three) times daily.,  Disp: , Rfl:    HYDROcodone  bit-homatropine (HYCODAN) 5-1.5 MG/5ML syrup, Take 5 mLs by mouth at bedtime as needed for cough., Disp: 120 mL, Rfl: 0   ketoconazole (NIZORAL) 2 % cream, APPLY A SMALL AMOUNT TO AFFECTED AREA TWICE DAILY, Disp: 60 g, Rfl: 3   levothyroxine  (SYNTHROID ) 50 MCG tablet, TAKE  ONE TABLET BY MOUTH ONCE A DAY BEFORE BREAKFAST., Disp: 90 tablet, Rfl: 3   Liniments (DEEP BLUE RELIEF) GEL, Apply topically., Disp: , Rfl:    meloxicam  (MOBIC ) 15 MG tablet, TAKE ONE TABLET BY MOUTH DAILY AS NEEDED, Disp: 90 tablet, Rfl: 0   montelukast  (SINGULAIR ) 10 MG tablet, Take 1 tablet (10 mg total) by mouth at bedtime., Disp: 90 tablet, Rfl: 3   Multiple Vitamin (MULTIVITAMIN WITH MINERALS) TABS tablet, Take 1 tablet by mouth daily., Disp: , Rfl:    mupirocin  ointment (BACTROBAN ) 2 %, Apply twice daily as needed, Disp: 22 g, Rfl: 1   Probiotic Product (PROBIOTIC PO), Take 1 capsule by mouth 2 (two) times daily., Disp: , Rfl:    rosuvastatin  (CRESTOR ) 10 MG tablet, Take 1 tablet (10 mg total) by mouth daily., Disp: 90 tablet, Rfl: 3   sildenafil  (VIAGRA ) 100 MG tablet, TAKE ONE TABLET BY MOUTH ONCE A DAY AS NEEDED FOR ERECTILE DYSFUNCTION., Disp: 10 tablet, Rfl: 11   triamcinolone  cream (KENALOG ) 0.1 %, APPLY TO HANDS AT BEDTIME AND COVER WITH GLOVES AS NEEDED FOR FLARES. AVOID FACE, GROIN, AXILLA., Disp: 80 g, Rfl: 0   azithromycin  (ZITHROMAX  Z-PAK) 250 MG tablet, Take 2 tablets (500 mg) on  Day 1,  followed by 1 tablet (250 mg) once daily on Days 2 through 5. (Patient not taking: Reported on 02/12/2024), Disp: 6 each, Rfl: 0   predniSONE  (DELTASONE ) 20 MG tablet, Take 2 tablets (40 mg total) by mouth daily. For 3 days-then 1 tab daily for 3 days (Patient not taking: Reported on 02/12/2024), Disp: 9 tablet, Rfl: 0   Subjective:   PATIENT ID: Kevin Terrell GENDER: male DOB: 1941-02-28, MRN: [id number]  Chief Complaint  Patient presents with   Follow-up    Dry cough is better. No wheezing or SOB.     HPI  Patient is a pleasant 83 year old male presenting for follow up.  During our initial visit (01/15/2024), he reported symptom onset around December 2024 when he experienced an upper respiratory tract infection with the subsequent development of a possible lower respiratory tract  infection. The cough persisted and he was seen by his primary care physician multiple times.  He was prescribed a course of doxycycline , followed by 2 courses of azithromycin /prednisone  with improvement but not full resolution of the symptoms. He had improved during our initial visit but continued to report a lingering cough (75% improvement). There were no other reported symptoms. Patient had a chest x-ray in February 2025 that was reported for a multifocal infection in the right lung, repeated in April and showing resolution.  Today, he presents for follow up after swallowing study, chest CT, and PFT's. His cough has further improved, but not fully resolved. He feels there is some residual cough, but now down to 5 or 6 brief coughs a day. He has no shortness of breath, and no limitation to his activity. He is otherwise in his usual state of health. He is using claritin  and allegra for his seasonal allergies.   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  He then received adjuvant  chemotherapy following the resection and was told that evaluation of the lymph nodes was negative.  He was told in the past that he had allergies and was prescribed cetirizine as well as montelukast .  He was also told at some point that he had reflux disease with associated cough for which she was prescribed gabapentin .   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  He denies any history of smoking or any inhalational exposures.  He previously had dogs but currently does not.  He has not had any pet birds.  He has no personal or family history of asthma.  Ancillary information including prior medications, full medical/surgical/family/social histories, and PFTs (when available) are listed below and have been reviewed.   Review of Systems  Constitutional:  Negative for chills, fever, malaise/fatigue and weight loss.  Respiratory:  Positive for cough.  Negative for hemoptysis, sputum production, shortness of breath and wheezing.   Cardiovascular:  Negative for chest pain.     Objective:   Vitals:   02/12/24 1133  BP: 118/68  Pulse: 99  Temp: (!) 97.1 F (36.2 C)  SpO2: 100%  Weight: 189 lb 3.2 oz (85.8 kg)  Height: 6' 1 (1.854 m)   100% on RA  BMI Readings from Last 3 Encounters:  02/12/24 24.96 kg/m  02/11/24 24.88 kg/m  01/15/24 24.96 kg/m   Wt Readings from Last 3 Encounters:  02/12/24 189 lb 3.2 oz (85.8 kg)  02/11/24 188 lb 9.6 oz (85.5 kg)  01/15/24 189 lb 3.2 oz (85.8 kg)    Physical Exam Constitutional:      Appearance: Normal appearance.   Cardiovascular:     Rate and Rhythm: Normal rate and regular rhythm.     Pulses: Normal pulses.     Heart sounds: Normal heart sounds.  Pulmonary:     Effort: Pulmonary effort is normal.     Breath sounds: Normal breath sounds.  Abdominal:     Palpations: Abdomen is soft.   Neurological:     General: No focal deficit present.     Mental Status: He is alert and oriented to person, place, and time. Mental status is at baseline.       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   right upper arm, excised 10/31/21     Family History  Problem Relation Age of Onset   Cancer Mother    Alzheimer's disease Mother    Stroke Maternal Grandmother    Hypertension Father    Alzheimer's disease Father      Past Surgical History:  Procedure Laterality Date   CATARACT EXTRACTION W/PHACO Left 04/01/2022   Procedure: CATARACT EXTRACTION PHACO AND INTRAOCULAR LENS PLACEMENT (IOC) LEFT VIVITY TORIC;  Surgeon: Clair Crews, MD;  Location:  [facility];  Service: Ophthalmology;  Laterality: Left;  7.53 00:41.3   CATARACT EXTRACTION W/PHACO Right 04/15/2022   Procedure: CATARACT EXTRACTION PHACO AND INTRAOCULAR LENS PLACEMENT (IOC) RIGHT VIVITY TORIC;  Surgeon: Clair Crews, MD;  Location: [facility];  Service: Ophthalmology;  Laterality: Right;  5.35 00:36.3   CHOLECYSTECTOMY     COLONOSCOPY  COLONOSCOPY WITH PROPOFOL  N/A 11/30/2019   Procedure: COLONOSCOPY WITH PROPOFOL ;  Surgeon: Selena Daily, MD;  Location: [facility] END[facility]COPY;  Service: Gastroenterology;  Laterality: N/A;   EYE SURGERY     vitreous detachment---laser   KNEE CARTILAGE SURGERY Right 1968   KNEE SURGERY     bilateral meniscal surgeries   LUNG LOBECTOMY     upper right lung 12/15/2007    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   UPPER GI ENDOSCOPY      Social History   Socioeconomic History   Marital status: Widowed    Spouse name: Not on file   Number of children: 3   Years of education: Not on file   Highest education level: Professional school degree (e.g., MD, DDS, DVM, JD)  Occupational History   Occupation: Veterinarian    Comment: retired  Tobacco Use   Smoking status: Never    Passive exposure: Never   Smokeless tobacco: Never  Vaping Use   Vaping status: Never Used  Substance and Sexual Activity   Alcohol use: Yes    Comment: Once glass of wine per month, none last 24hrs   Drug use: Never   Sexual activity: Yes  Other Topics Concern   Not on file  Social History Narrative   Married   Veterinarian   Wife died 2024/03/22   Has a daughter in [place]; son and daughter in [place]     6 grandchildren   [place] from [place]; spent some time in [place]; moved here 2019.    Lives at [facility]      Has advanced directives   Daughter Isa Manuel is health care POA   Would accept resuscitation attempts   No feeding tube if cognitively unaware   Social Drivers of Health   Financial Resource Strain: Low Risk  (10/06/2023)    Overall Financial Resource Strain (CARDIA)    Difficulty of Paying Living Expenses: Not hard at all  Food Insecurity: No Food Insecurity (10/06/2023)   Hunger Vital Sign    Worried About Running Out of Food in the Last Year: Never true    Ran Out of Food in the Last Year: Never true  Transportation Needs: No Transportation Needs (10/06/2023)   PRAPARE - Administrator, Civil Service (Medical): No    Lack of Transportation (Non-Medical): No  Physical Activity: Sufficiently Active (07/25/2023)   Exercise Vital Sign    Days of Exercise per Week: 5 days    Minutes of Exercise per Session: 60 min  Stress: No Stress Concern Present (10/06/2023)   [employer] of Occupational Health - Occupational Stress Questionnaire    Feeling of Stress : Not at all  Social Connections: Moderately Integrated (10/06/2023)   Social Connection and Isolation Panel    Frequency of Communication with Friends and Family: More than three times a week    Frequency of Social Gatherings with Friends and Family: More than three times a week    Attends Religious Services: More than 4 times per year    Active Member of [employer] or Organizations: Yes    Attends Banker Meetings: More than 4 times per year    Marital Status: Widowed  Intimate Partner Violence: Not At Risk (10/26/2018)   Humiliation, Afraid, Rape, and Kick questionnaire    Fear of Current or Ex-Partner: No    Emotionally Abused: No    Physically Abused: No    Sexually Abused: No     Allergies  Allergen Reactions   Compazine [  Prochlorperazine Edisylate]     muscle spasmus    Okra Hives   Other     Okra - hives   Phenylbutazone Other (See Comments)   Augmentin  [Amoxicillin -Pot Clavulanate] Diarrhea    Tolerates Amoxicillin  well     CBC    Component Value Date/Time   WBC 4.2 02/20/2023 0852   RBC 4.48 02/20/2023 0852   HGB 13.6 02/20/2023 0852   HCT 41.5 02/20/2023 0852   PLT 169.0 02/20/2023 0852   MCV 92.7  02/20/2023 0852   MCH 30.5 10/18/2021 0901   MCHC 32.8 02/20/2023 0852   RDW 13.0 02/20/2023 0852   LYMPHSABS 1.5 10/18/2021 0901   MONOABS 0.9 10/18/2021 0901   EOSABS 0.2 10/18/2021 0901   BASOSABS 0.0 10/18/2021 0901    Pulmonary Functions Testing Results:    Latest Ref Rng & Units 02/11/2024   12:51 PM  PFT Results  FVC-Pre L 3.81  P  FVC-Predicted Pre % 84  P  FVC-Post L 4.01  P  FVC-Predicted Post % 89  P  Pre FEV1/FVC % % 68  P  Post FEV1/FCV % % 71  P  FEV1-Pre L 2.58  P  FEV1-Predicted Pre % 80  P  FEV1-Post L 2.83  P  DLCO uncorrected ml/min/mmHg 25.44  P  DLCO UNC% % 96  P  DLVA Predicted % 110  P  TLC L 6.53  P  TLC % Predicted % 85  P  RV % Predicted % 102  P    P Preliminary result    Outpatient Medications Prior to Visit  Medication Sig Dispense Refill   albuterol  (VENTOLIN  HFA) 108 (90 Base) MCG/ACT inhaler Inhale 1-2 puffs into the lungs every 6 (six) hours as needed. 8 g 0   aspirin  EC 81 MG tablet Take 1 tablet (81 mg total) by mouth daily. Swallow whole.     benzonatate  (TESSALON ) 200 MG capsule Take 1 capsule (200 mg total) by mouth 3 (three) times daily as needed for cough. 60 capsule 0   cetirizine (ZYRTEC) 10 MG tablet Take 10 mg by mouth daily.     clotrimazole -betamethasone  (LOTRISONE ) cream Apply 1 Application topically 2 (two) times daily. 30 g 0   diclofenac  Sodium (VOLTAREN ) 1 % GEL APPLY FOUR GRAMS TO BOTH KNEES FOUR TIMES DAILY AS NEEDED 600 g 4   famotidine  (PEPCID ) 20 MG tablet TAKE ONE TABLET BY MOUTH EVERY NIGHT AT BEDTIME 90 tablet 3   fexofenadine (ALLEGRA) 180 MG tablet Take 180 mg by mouth daily.     fluticasone  (FLONASE ) 50 MCG/ACT nasal spray Place 1 spray into both nostrils 2 (two) times daily.     gabapentin  (NEURONTIN ) 300 MG capsule TAKE ONE CAPSULE BY MOUTH TWICE DAILY 180 capsule 3   glucosamine-chondroitin 500-400 MG tablet Take 1 tablet by mouth 3 (three) times daily.     HYDROcodone  bit-homatropine (HYCODAN) 5-1.5 MG/5ML  syrup Take 5 mLs by mouth at bedtime as needed for cough. 120 mL 0   ketoconazole (NIZORAL) 2 % cream APPLY A SMALL AMOUNT TO AFFECTED AREA TWICE DAILY 60 g 3   levothyroxine  (SYNTHROID ) 50 MCG tablet TAKE ONE TABLET BY MOUTH ONCE A DAY BEFORE BREAKFAST. 90 tablet 3   Liniments (DEEP BLUE RELIEF) GEL Apply topically.     meloxicam  (MOBIC ) 15 MG tablet TAKE ONE TABLET BY MOUTH DAILY AS NEEDED 90 tablet 0   montelukast  (SINGULAIR ) 10 MG tablet Take 1 tablet (10 mg total) by mouth at bedtime. 90 tablet 3  Multiple Vitamin (MULTIVITAMIN WITH MINERALS) TABS tablet Take 1 tablet by mouth daily.     mupirocin  ointment (BACTROBAN ) 2 % Apply twice daily as needed 22 g 1   Probiotic Product (PROBIOTIC PO) Take 1 capsule by mouth 2 (two) times daily.     rosuvastatin  (CRESTOR ) 10 MG tablet Take 1 tablet (10 mg total) by mouth daily. 90 tablet 3   sildenafil  (VIAGRA ) 100 MG tablet TAKE ONE TABLET BY MOUTH ONCE A DAY AS NEEDED FOR ERECTILE DYSFUNCTION. 10 tablet 11   triamcinolone  cream (KENALOG ) 0.1 % APPLY TO HANDS AT BEDTIME AND COVER WITH GLOVES AS NEEDED FOR FLARES. AVOID FACE, GROIN, AXILLA. 80 g 0   azithromycin  (ZITHROMAX  Z-PAK) 250 MG tablet Take 2 tablets (500 mg) on  Day 1,  followed by 1 tablet (250 mg) once daily on Days 2 through 5. (Patient not taking: Reported on 02/12/2024) 6 each 0   predniSONE  (DELTASONE ) 20 MG tablet Take 2 tablets (40 mg total) by mouth daily. For 3 days-then 1 tab daily for 3 days (Patient not taking: Reported on 02/12/2024) 9 tablet 0   No facility-administered medications prior to visit.

## 2024-02-13 DIAGNOSIS — Z23 Encounter for immunization: Secondary | ICD-10-CM | POA: Diagnosis not present

## 2024-02-16 DIAGNOSIS — M7502 Adhesive capsulitis of left shoulder: Secondary | ICD-10-CM | POA: Diagnosis not present

## 2024-02-16 DIAGNOSIS — M25512 Pain in left shoulder: Secondary | ICD-10-CM | POA: Diagnosis not present

## 2024-02-18 DIAGNOSIS — M7502 Adhesive capsulitis of left shoulder: Secondary | ICD-10-CM | POA: Diagnosis not present

## 2024-02-18 DIAGNOSIS — M25512 Pain in left shoulder: Secondary | ICD-10-CM | POA: Diagnosis not present

## 2024-02-22 ENCOUNTER — Encounter: Payer: Medicare Other | Admitting: Internal Medicine

## 2024-02-23 ENCOUNTER — Encounter: Payer: Self-pay | Admitting: Internal Medicine

## 2024-02-23 ENCOUNTER — Ambulatory Visit (INDEPENDENT_AMBULATORY_CARE_PROVIDER_SITE_OTHER)

## 2024-02-23 ENCOUNTER — Ambulatory Visit (INDEPENDENT_AMBULATORY_CARE_PROVIDER_SITE_OTHER): Payer: Medicare Other | Admitting: Internal Medicine

## 2024-02-23 VITALS — BP 122/76 | HR 66 | Temp 98.0°F | Ht 73.0 in | Wt 190.0 lb

## 2024-02-23 VITALS — BP 122/76 | HR 66 | Temp 98.0°F | Resp 20 | Ht 73.0 in | Wt 190.4 lb

## 2024-02-23 DIAGNOSIS — G620 Drug-induced polyneuropathy: Secondary | ICD-10-CM

## 2024-02-23 DIAGNOSIS — H9190 Unspecified hearing loss, unspecified ear: Secondary | ICD-10-CM

## 2024-02-23 DIAGNOSIS — M25512 Pain in left shoulder: Secondary | ICD-10-CM | POA: Diagnosis not present

## 2024-02-23 DIAGNOSIS — Z0000 Encounter for general adult medical examination without abnormal findings: Secondary | ICD-10-CM | POA: Diagnosis not present

## 2024-02-23 DIAGNOSIS — E559 Vitamin D deficiency, unspecified: Secondary | ICD-10-CM | POA: Diagnosis not present

## 2024-02-23 DIAGNOSIS — I700 Atherosclerosis of aorta: Secondary | ICD-10-CM | POA: Diagnosis not present

## 2024-02-23 DIAGNOSIS — E039 Hypothyroidism, unspecified: Secondary | ICD-10-CM

## 2024-02-23 DIAGNOSIS — R059 Cough, unspecified: Secondary | ICD-10-CM

## 2024-02-23 DIAGNOSIS — T451X5A Adverse effect of antineoplastic and immunosuppressive drugs, initial encounter: Secondary | ICD-10-CM

## 2024-02-23 DIAGNOSIS — I2510 Atherosclerotic heart disease of native coronary artery without angina pectoris: Secondary | ICD-10-CM

## 2024-02-23 DIAGNOSIS — E538 Deficiency of other specified B group vitamins: Secondary | ICD-10-CM | POA: Diagnosis not present

## 2024-02-23 DIAGNOSIS — M7502 Adhesive capsulitis of left shoulder: Secondary | ICD-10-CM | POA: Diagnosis not present

## 2024-02-23 LAB — COMPREHENSIVE METABOLIC PANEL WITH GFR
ALT: 14 U/L (ref 0–53)
AST: 17 U/L (ref 0–37)
Albumin: 4.5 g/dL (ref 3.5–5.2)
Alkaline Phosphatase: 49 U/L (ref 39–117)
BUN: 20 mg/dL (ref 6–23)
CO2: 34 meq/L — ABNORMAL HIGH (ref 19–32)
Calcium: 9.4 mg/dL (ref 8.4–10.5)
Chloride: 102 meq/L (ref 96–112)
Creatinine, Ser: 0.79 mg/dL (ref 0.40–1.50)
GFR: 83.21 mL/min (ref >60.00)
Glucose, Bld: 91 mg/dL (ref 70–99)
Potassium: 4.4 meq/L (ref 3.5–5.1)
Sodium: 139 meq/L (ref 135–145)
Total Bilirubin: 0.5 mg/dL (ref 0.2–1.2)
Total Protein: 7.2 g/dL (ref 6.0–8.3)

## 2024-02-23 LAB — CBC
HCT: 39.2 % (ref 39.0–52.0)
Hemoglobin: 13.1 g/dL (ref 13.0–17.0)
MCHC: 33.5 g/dL (ref 30.0–36.0)
MCV: 90.9 fl (ref 78.0–100.0)
Platelets: 167 10*3/uL (ref 150.0–400.0)
RBC: 4.31 Mil/uL (ref 4.22–5.81)
RDW: 13.7 % (ref 11.5–15.5)
WBC: 5.6 10*3/uL (ref 4.0–10.5)

## 2024-02-23 LAB — LIPID PANEL
Cholesterol: 141 mg/dL (ref 0–200)
HDL: 50.5 mg/dL (ref >39.00)
LDL Cholesterol: 77 mg/dL (ref 0–99)
NonHDL: 90.53
Total CHOL/HDL Ratio: 3
Triglycerides: 70 mg/dL (ref 0.0–149.0)
VLDL: 14 mg/dL (ref 0.0–40.0)

## 2024-02-23 LAB — VITAMIN B12: Vitamin B-12: 313 pg/mL (ref 211–911)

## 2024-02-23 LAB — T4, FREE: Free T4: 0.8 ng/dL (ref 0.60–1.60)

## 2024-02-23 LAB — TSH: TSH: 1.03 u[IU]/mL (ref 0.35–5.50)

## 2024-02-23 LAB — VITAMIN D 25 HYDROXY (VIT D DEFICIENCY, FRACTURES): VITD: 36.08 ng/mL (ref 30.00–100.00)

## 2024-02-23 NOTE — Assessment & Plan Note (Signed)
 Improved Seems to be post infectious

## 2024-02-23 NOTE — Assessment & Plan Note (Signed)
 Stable symptoms Is on gabapentin  300 bid

## 2024-02-23 NOTE — Progress Notes (Signed)
 Subjective:    Patient ID: Kevin Terrell, male    DOB: Oct 13, 1940, 83 y.o.   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  Bad shoulder issues still Has 1 hour of stand by home care--for safety when showering and help dressing  Did see the pulmonologist Had barium swallow  PFTs---fairly normal despite past lobectomy CT did show ground glass changes---likely post infectious Cough is improving over time Finds using both cetrizine and fexofenadine has also helped  CT did also show aortic atherosclerosis Is on statin Reassuring stress test in the past year  Thyroid  replacement Feels that this is okay  Takes pepcid --careful with eating Uses pepcid  complete prn if it acts up  Current Outpatient Medications on File Prior to Visit  Medication Sig Dispense Refill   albuterol  (VENTOLIN  HFA) 108 (90 Base) MCG/ACT inhaler Inhale 1-2 puffs into the lungs every 6 (six) hours as needed. 8 g 0   aspirin  EC 81 MG tablet Take 1 tablet (81 mg total) by mouth daily. Swallow whole.     cetirizine (ZYRTEC) 10 MG tablet Take 10 mg by mouth daily.     clotrimazole -betamethasone  (LOTRISONE ) cream Apply 1 Application topically 2 (two) times daily. 30 g 0   diclofenac  Sodium (VOLTAREN ) 1 % GEL APPLY FOUR GRAMS TO BOTH KNEES FOUR TIMES DAILY AS NEEDED 600 g 4   famotidine  (PEPCID ) 20 MG tablet TAKE ONE TABLET BY MOUTH EVERY NIGHT AT BEDTIME 90 tablet 3   fexofenadine (ALLEGRA) 180 MG tablet Take 180 mg by mouth daily.     fluticasone  (FLONASE ) 50 MCG/ACT nasal spray Place 1 spray into both nostrils 2 (two) times daily.     gabapentin  (NEURONTIN ) 300 MG capsule TAKE ONE CAPSULE BY MOUTH TWICE DAILY 180 capsule 3   ketoconazole (NIZORAL) 2 % cream APPLY A SMALL AMOUNT TO AFFECTED AREA TWICE DAILY 60 g 3   levothyroxine  (SYNTHROID ) 50 MCG tablet TAKE ONE TABLET BY MOUTH ONCE A DAY BEFORE BREAKFAST. 90 tablet 3   Liniments (DEEP BLUE RELIEF)  GEL Apply topically.     meloxicam  (MOBIC ) 15 MG tablet TAKE ONE TABLET BY MOUTH DAILY AS NEEDED 90 tablet 0   montelukast  (SINGULAIR ) 10 MG tablet Take 1 tablet (10 mg total) by mouth at bedtime. 90 tablet 3   Multiple Vitamin (MULTIVITAMIN WITH MINERALS) TABS tablet Take 1 tablet by mouth daily.     mupirocin  ointment (BACTROBAN ) 2 % Apply twice daily as needed 22 g 1   Probiotic Product (PROBIOTIC PO) Take 1 capsule by mouth 2 (two) times daily.     rosuvastatin  (CRESTOR ) 10 MG tablet Take 1 tablet (10 mg total) by mouth daily. 90 tablet 3   sildenafil  (VIAGRA ) 100 MG tablet TAKE ONE TABLET BY MOUTH ONCE A DAY AS NEEDED FOR ERECTILE DYSFUNCTION. 10 tablet 11   triamcinolone  cream (KENALOG ) 0.1 % APPLY TO HANDS AT BEDTIME AND COVER WITH GLOVES AS NEEDED FOR FLARES. AVOID FACE, GROIN, AXILLA. 80 g 0   No current facility-administered medications on file prior to visit.    Allergies  Allergen Reactions   Compazine [Prochlorperazine Edisylate]     muscle spasmus    Okra Hives   Other     Okra - hives   Phenylbutazone Other (See Comments)   Augmentin  [Amoxicillin -Pot Clavulanate] Diarrhea    Tolerates Amoxicillin  well    Past Medical History:  Diagnosis Date   Actinic keratosis    Allergy 1960  Phenothiazine Tranquilizers extrapyramidal effects: musc. 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   right upper arm, excised 10/31/21    Past Surgical History:  Procedure Laterality Date   CATARACT EXTRACTION W/PHACO Left 04/01/2022   Procedure: CATARACT EXTRACTION PHACO AND  INTRAOCULAR LENS PLACEMENT (IOC) LEFT VIVITY TORIC;  Surgeon: Jaye Fallow, MD;  Location: [facility];  Service: Ophthalmology;  Laterality: Left;  7.53 00:41.3   CATARACT EXTRACTION W/PHACO Right 04/15/2022   Procedure: CATARACT EXTRACTION PHACO AND INTRAOCULAR LENS PLACEMENT (IOC) RIGHT VIVITY TORIC;  Surgeon: Jaye Fallow, MD;  Location: [facility];  Service: Ophthalmology;  Laterality: Right;  5.35 00:36.3   CHOLECYSTECTOMY     COLONOSCOPY     COLONOSCOPY WITH PROPOFOL  N/A 11/30/2019   Procedure: COLONOSCOPY WITH PROPOFOL ;  Surgeon: Unk Corinn Skiff, MD;  Location: [facility];  Service: Gastroenterology;  Laterality: N/A;   EYE SURGERY     vitreous detachment---laser   HERNIA REPAIR  2006   Bilateral inguinal herniorrhaphies   KNEE CARTILAGE SURGERY Right 1968   KNEE SURGERY     bilateral meniscal surgeries   LUNG LOBECTOMY     upper right lung 12/15/2007    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   UPPER GI ENDOSCOPY      Family History  Problem Relation Age of Onset   Cancer Mother    Alzheimer's disease Mother    Intellectual disability Mother    Stroke Maternal Grandmother    Hypertension Father    Alzheimer's disease Father    Intellectual disability Father    Alcohol abuse Maternal Grandfather    Hypertension Paternal Grandmother    Stroke Paternal Grandmother     Social History   Socioeconomic History   Marital status: Widowed    Spouse name: Not on file   Number of children: 3   Years of education: Not on file   Highest education level: Professional school degree (e.g., MD, DDS, DVM, JD)  Occupational History   Occupation: Veterinarian    Comment: retired  Tobacco Use   Smoking status: Never    Passive exposure: Never   Smokeless tobacco: Never   Tobacco comments:    Tried it in college for two weeks. Yuck!  Vaping Use   Vaping status: Never Used  Substance and Sexual Activity   Alcohol use: Not Currently    Comment:  Once glass of wine per month, none last 24hrs   Drug use: Never   Sexual activity: Yes  Other Topics Concern   Not on file  Social History Narrative   Married   Veterinarian   Wife died 03/13/2024   Has a daughter in [place]; son and daughter in [place]     6 grandchildren   Joanna from [place]; spent some time in [place]; moved here 2019.    Lives at [facility]      Has advanced directives   Daughter Lauraine is health care POA   Would accept resuscitation attempts   No feeding tube if cognitively unaware  Social Drivers of Corporate investment banker Strain: Low Risk  (02/19/2024)   Overall Financial Resource Strain (CARDIA)    Difficulty of Paying Living Expenses: Not hard at all  Food Insecurity: No Food Insecurity (02/19/2024)   Hunger Vital Sign    Worried About Running Out of Food in the Last Year: Never true    Ran Out of Food in the Last Year: Never true  Transportation Needs: No Transportation Needs (02/19/2024)   PRAPARE - Administrator, Civil Service (Medical): No    Lack of Transportation (Non-Medical): No  Physical Activity: Sufficiently Active (02/23/2024)   Exercise Vital Sign    Days of Exercise per Week: 5 days    Minutes of Exercise per Session: 30 min  Stress: No Stress Concern Present (02/19/2024)   [employer] of Occupational Health - Occupational Stress Questionnaire    Feeling of Stress: Not at all  Social Connections: Moderately Integrated (02/19/2024)   Social Connection and Isolation Panel    Frequency of Communication with Friends and Family: More than three times a week    Frequency of Social Gatherings with Friends and Family: Once a week    Attends Religious Services: More than 4 times per year    Active Member of [employer] or Organizations: Yes    Attends Banker Meetings: More than 4 times per year    Marital Status: Widowed  Intimate Partner Violence: Not At Risk (02/23/2024)   Humiliation, Afraid, Rape, and Kick  questionnaire    Fear of Current or Ex-Partner: No    Emotionally Abused: No    Physically Abused: No    Sexually Abused: No   Review of Systems Appetite is good Weight is stable Sleeps well     Objective:   Physical Exam Constitutional:      Appearance: Normal appearance.   Cardiovascular:     Rate and Rhythm: Normal rate and regular rhythm.     Pulses: Normal pulses.     Heart sounds: No murmur heard.    No gallop.  Pulmonary:     Effort: Pulmonary effort is normal.     Breath sounds: No wheezing.     Comments: Slight early inspiratory crackles in RLL Abdominal:     Palpations: Abdomen is soft.     Tenderness: There is no abdominal tenderness.   Musculoskeletal:     Cervical back: Neck supple.     Right lower leg: No edema.     Left lower leg: No edema.  Lymphadenopathy:     Cervical: No cervical adenopathy.   Neurological:     Mental Status: He is alert.   Psychiatric:        Mood and Affect: Mood normal.        Behavior: Behavior normal.            Assessment & Plan:

## 2024-02-23 NOTE — Assessment & Plan Note (Signed)
 Seems euthryoid on levothyroxine  51 Will check labs

## 2024-02-23 NOTE — Assessment & Plan Note (Signed)
Seen on imaging Is on statin 

## 2024-02-23 NOTE — Assessment & Plan Note (Signed)
 Recent stress test reassuring On rosuvastatin 

## 2024-02-23 NOTE — Patient Instructions (Addendum)
 Mr. Kevin Terrell , Thank you for taking time out of your busy schedule to complete your Annual Wellness Visit with me. I enjoyed our conversation and look forward to speaking with you again next year. I, as well as your care team,  appreciate your ongoing commitment to your health goals. Please review the following plan we discussed and let me know if I can assist you in the future. Your Game plan/ To Do List    Referrals: If you haven't heard from the office you've been referred to, please reach out to them at the phone provided.  Patient complains of difficulty with hearing. ENT referral placed. Patient is in agreement with treatment plan. Aware that the office will call with an appointment.   Follow up Visits: Next Medicare AWV with our clinical staff: 02/25/24   Have you seen your provider in the last 6 months (3 months if uncontrolled diabetes)? Yes Next Office Visit with your provider: 02/23/24  Clinician Recommendations:  Aim for 30 minutes of exercise or brisk walking, 6-8 glasses of water, and 5 servings of fruits and vegetables each day.       This is a list of the screening recommended for you and due dates:  Health Maintenance  Topic Date Due   COVID-19 Vaccine (9 - Moderna risk 2024-25 season) 11/26/2023   Flu Shot  04/01/2024   Medicare Annual Wellness Visit  02/22/2025   Colon Cancer Screening  11/30/2026   DTaP/Tdap/Td vaccine (3 - Td or Tdap) 10/26/2028   Pneumococcal Vaccine for age over 21  Completed   Hepatitis B Vaccine  Completed   Zoster (Shingles) Vaccine  Completed   HPV Vaccine  Aged Out   Meningitis B Vaccine  Aged Out   Hepatitis C Screening  Discontinued    Advanced directives: (In Chart) A copy of your advanced directives are scanned into your chart should your provider ever need it. Advance Care Planning is important because it:  [x]  Makes sure you receive the medical care that is consistent with your values, goals, and preferences  [x]  It provides guidance  to your family and loved ones and reduces their decisional burden about whether or not they are making the right decisions based on your wishes.  Follow the link provided in your after visit summary or read over the paperwork we have mailed to you to help you started getting your Advance Directives in place. If you need assistance in completing these, please reach out to us  so that we can help you!

## 2024-02-23 NOTE — Progress Notes (Signed)
 Subjective:   Kevin Terrell is a 83 y.o. who presents for a Medicare Wellness preventive visit.  As a reminder, Annual Wellness Visits don't include a physical exam, and some assessments may be limited, especially if this visit is performed virtually. We may recommend an in-person follow-up visit with your provider if needed.  Visit Complete: In person  Persons Participating in Visit: Patient.  AWV Questionnaire: No: Patient Medicare AWV questionnaire was not completed prior to this visit.  Cardiac Risk Factors include: advanced age (>44men, >49 women);dyslipidemia;male gender     Objective:    Today's Vitals   02/23/24 1118 02/23/24 1122  BP: 122/76   Pulse: 66   Resp: 20   Temp: 98 F (36.7 C)   TempSrc: Oral   Weight: 190 lb 6.4 oz (86.4 kg)   Height: 6' 1 (1.854 m)   PainSc:  3    Body mass index is 25.12 kg/m.     02/23/2024   11:38 AM 10/25/2021    1:25 PM 04/19/2021   10:28 AM 10/31/2020    5:46 PM 11/30/2019    9:32 AM 11/07/2019   10:28 AM 10/28/2019    2:02 PM  Advanced Directives  Does Patient Have a Medical Advance Directive? Yes Yes Yes No Yes Yes Yes  Type of Estate agent of [place];Living will Healthcare Power of [place];Living will Healthcare Power of [place];Living will  Living will;Healthcare Power of [employer] Power of [place];Living will Healthcare Power of [place];Living will  Does patient want to make changes to medical advance directive?  Yes (ED - Information included in AVS) No - Patient declined   No - Patient declined No - Patient declined  Copy of Healthcare Power of Attorney in Chart? Yes - validated most recent copy scanned in chart (See row information)     Yes - validated most recent copy scanned in chart (See row information) Yes - validated most recent copy scanned in chart (See row information)  Would patient like information on creating a medical advance directive?  Yes (ED - Information included in  AVS)         Current Medications (verified) Outpatient Encounter Medications as of 02/23/2024  Medication Sig   albuterol  (VENTOLIN  HFA) 108 (90 Base) MCG/ACT inhaler Inhale 1-2 puffs into the lungs every 6 (six) hours as needed.   aspirin  EC 81 MG tablet Take 1 tablet (81 mg total) by mouth daily. Swallow whole.   cetirizine (ZYRTEC) 10 MG tablet Take 10 mg by mouth daily.   clotrimazole -betamethasone  (LOTRISONE ) cream Apply 1 Application topically 2 (two) times daily.   diclofenac  Sodium (VOLTAREN ) 1 % GEL APPLY FOUR GRAMS TO BOTH KNEES FOUR TIMES DAILY AS NEEDED   famotidine  (PEPCID ) 20 MG tablet TAKE ONE TABLET BY MOUTH EVERY NIGHT AT BEDTIME   fexofenadine (ALLEGRA) 180 MG tablet Take 180 mg by mouth daily.   fluticasone  (FLONASE ) 50 MCG/ACT nasal spray Place 1 spray into both nostrils 2 (two) times daily.   gabapentin  (NEURONTIN ) 300 MG capsule TAKE ONE CAPSULE BY MOUTH TWICE DAILY   ketoconazole (NIZORAL) 2 % cream APPLY A SMALL AMOUNT TO AFFECTED AREA TWICE DAILY   levothyroxine  (SYNTHROID ) 50 MCG tablet TAKE ONE TABLET BY MOUTH ONCE A DAY BEFORE BREAKFAST.   Liniments (DEEP BLUE RELIEF) GEL Apply topically.   meloxicam  (MOBIC ) 15 MG tablet TAKE ONE TABLET BY MOUTH DAILY AS NEEDED   montelukast  (SINGULAIR ) 10 MG tablet Take 1 tablet (10 mg total) by mouth at bedtime.  Multiple Vitamin (MULTIVITAMIN WITH MINERALS) TABS tablet Take 1 tablet by mouth daily.   mupirocin  ointment (BACTROBAN ) 2 % Apply twice daily as needed   Probiotic Product (PROBIOTIC PO) Take 1 capsule by mouth 2 (two) times daily.   rosuvastatin  (CRESTOR ) 10 MG tablet Take 1 tablet (10 mg total) by mouth daily.   sildenafil  (VIAGRA ) 100 MG tablet TAKE ONE TABLET BY MOUTH ONCE A DAY AS NEEDED FOR ERECTILE DYSFUNCTION.   triamcinolone  cream (KENALOG ) 0.1 % APPLY TO HANDS AT BEDTIME AND COVER WITH GLOVES AS NEEDED FOR FLARES. AVOID FACE, GROIN, AXILLA.   azithromycin  (ZITHROMAX  Z-PAK) 250 MG tablet Take 2 tablets (500  mg) on  Day 1,  followed by 1 tablet (250 mg) once daily on Days 2 through 5. (Patient not taking: Reported on 02/12/2024)   benzonatate  (TESSALON ) 200 MG capsule Take 1 capsule (200 mg total) by mouth 3 (three) times daily as needed for cough.   glucosamine-chondroitin 500-400 MG tablet Take 1 tablet by mouth 3 (three) times daily. (Patient not taking: Reported on 02/23/2024)   HYDROcodone  bit-homatropine (HYCODAN) 5-1.5 MG/5ML syrup Take 5 mLs by mouth at bedtime as needed for cough.   predniSONE  (DELTASONE ) 20 MG tablet Take 2 tablets (40 mg total) by mouth daily. For 3 days-then 1 tab daily for 3 days (Patient not taking: Reported on 02/12/2024)   No facility-administered encounter medications on file as of 02/23/2024.    Allergies (verified) Compazine [prochlorperazine edisylate], [place], Other, Phenylbutazone, and Augmentin  [amoxicillin -pot clavulanate]   History: Past Medical History:  Diagnosis Date   Actinic keratosis    Allergy 1960   Phenothiazine Tranquilizers extrapyramidal effects: musc. 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   UPPER GI ENDOSCOPY     Family History  Problem Relation Age of Onset   Cancer Mother    Alzheimer's disease Mother    Intellectual  disability Mother    Stroke Maternal Grandmother    Hypertension Father    Alzheimer's disease Father    Intellectual disability Father    Alcohol abuse Maternal Grandfather    Hypertension Paternal Grandmother    Stroke Paternal Grandmother    Social History   Socioeconomic History   Marital status: Widowed    Spouse name: Not on file   Number of children: 3   Years of education: Not on file   Highest education level: Professional school degree (e.g., MD, DDS, DVM, JD)  Occupational History   Occupation: Veterinarian    Comment: retired  Tobacco Use   Smoking status: Never    Passive exposure: Never   Smokeless tobacco: Never   Tobacco comments:    Tried it in college for two weeks. Yuck!  Vaping Use   Vaping status: Never Used  Substance and Sexual Activity   Alcohol use: Not Currently    Comment: Once glass of wine per month, none last 24hrs   Drug use: Never   Sexual activity: Yes  Other Topics Concern   Not on file  Social History Narrative   Married   Veterinarian   Wife died 2024/03/23   Has a daughter in [place]; son and daughter in [place]     6  grandchildren   [place]run[place] from [place]; spent some time in [place]; moved here 2019.    Lives at [facility]      Has advanced directives   Daughter Lauraine is health care POA   Would accept resuscitation attempts   No feeding tube if cognitively unaware   Social Drivers of Health   Financial Resource Strain: Low Risk  (02/19/2024)   Overall Financial Resource Strain (CARDIA)    Difficulty of Paying Living Expenses: Not hard at all  Food Insecurity: No Food Insecurity (02/19/2024)   Hunger Vital Sign    Worried About Running Out of Food in the Last Year: Never true    Ran Out of Food in the Last Year: Never true  Transportation Needs: No Transportation Needs (02/19/2024)   PRAPARE - Administrator, Civil Service (Medical): No    Lack of Transportation (Non-Medical): No  Physical Activity: Sufficiently Active (02/23/2024)   Exercise Vital Sign    Days of Exercise per Week: 5 days    Minutes of Exercise per Session: 30 min  Stress: No Stress Concern Present (02/19/2024)   [employer] of Occupational Health - Occupational Stress Questionnaire    Feeling of Stress: Not at all  Social Connections: Moderately Integrated (02/19/2024)   Social Connection and Isolation Panel    Frequency of Communication with Friends and Family: More than three times a week    Frequency of Social Gatherings with Friends and Family: Once a week    Attends Religious Services: More than 4 times per year    Active Member of [employer] or Organizations: Yes    Attends Banker Meetings: More than 4 times per year    Marital Status: Widowed    Tobacco Counseling Counseling given: Not Answered Tobacco comments: Tried it in college for two weeks. Yuck!    Clinical Intake:  Pre-visit preparation completed: Yes  Pain : 0-10 Pain Score: 3  Pain Type: Chronic pain Pain Location: Shoulder Pain Orientation: Left Pain Descriptors / Indicators: Aching Pain Onset: More than a month  ago Pain Frequency: Constant Pain Relieving Factors: PT, accupuncture, injections, ice and heat Effect of Pain on Daily Activities: cannot lift as well  Pain Relieving Factors: PT, accupuncture, injections, ice and heat  BMI - recorded: 25.12 Nutritional Status: BMI 25 -29 Overweight Nutritional Risks: None Diabetes: No  No results found for: HGBA1C   How often do you need to have someone help you when you read instructions, pamphlets, or other written materials from your doctor or pharmacy?: 1 - Never  Interpreter Needed?: No  Comments: lives alone;has CNA come one hr each day Information entered by :: B.Ireta Pullman,LPN   Activities of Daily Living     02/23/2024   11:40 AM  In your present state of health, do you have any difficulty performing the following activities:  Hearing? 1  Vision? 0  Difficulty concentrating or making decisions? 0  Walking or climbing stairs? 1  Dressing or bathing? 0  Doing errands, shopping? 0  Preparing Food and eating ? N  Using the Toilet? N  In the past six months, have you accidently leaked urine? N  Do you have problems with loss of bowel control? N  Managing your Medications? N  Managing your Finances? N  Housekeeping or managing your Housekeeping? N    Patient Care Team: Jimmy Charlie FERNS, MD as PCP - General (Internal Medicine) End, Lonni, MD as PCP - Cardiology (Cardiology) Verdene Gills, RN as Oncology Nurse Navigator Zap, Morna SAILOR, [facility] (Inactive) as Pharmacist (Pharmacist) [place], Royden DASEN, DPM as Consulting Physician (Podiatry) Jaye Fallow, MD as Referring Physician (Ophthalmology)  I have updated your Care Teams any recent Medical Services you may have received from other providers in the past year.     Assessment:   This is a routine wellness examination for Kevin Terrell.  Hearing/Vision screen Hearing Screening - Comments:: Pt says his hearing is not as good: decreased some Pt says he wants a hearing  test-referral placed Vision Screening - Comments:: Pt says his vision is good with glasses Dr Elliott   Goals Addressed               This Visit's Progress     Increase physical activity (pt-stated)   On track     02/23/24-pt will continue:Low impact exercises (stationary bicycling, walking) 3 days weekly, 30-45 minutes Maintain weight      Patient Stated        I would like to get back to the gym for core exercises and strengthening       Depression Screen     02/23/2024   11:36 AM 02/20/2023    8:20 AM 02/20/2023    7:58 AM 01/15/2022   12:21 PM 01/15/2022   11:36 AM 01/10/2021    2:35 PM 10/28/2019    2:03 PM  PHQ 2/9 Scores  PHQ - 2 Score 0 0 0 0 0 0 0    Fall Risk     02/23/2024   11:27 AM 02/20/2023    8:20 AM 02/20/2023    7:58 AM 01/15/2022   12:21 PM 01/15/2022   11:36 AM  Fall Risk   Falls in the past year? 0 0 0 0 0  Number falls in past yr: 0  0    Injury with Fall? 0      Risk for fall due to : No Fall Risks      Follow up Education provided;Falls prevention discussed    Falls evaluation completed      Data saved with a previous flowsheet row definition    MEDICARE RISK AT HOME:  Medicare Risk at Home Any stairs in or around the home?: Yes  If so, are there any without handrails?: Yes Home free of loose throw rugs in walkways, pet beds, electrical cords, etc?: Yes Adequate lighting in your home to reduce risk of falls?: Yes Life alert?: Yes (watch) Use of a cane, walker or w/c?: No Grab bars in the bathroom?: Yes Shower chair or bench in shower?: Yes Elevated toilet seat or a handicapped toilet?: Yes  TIMED UP AND GO:  Was the test performed?  Yes  Length of time to ambulate 10 feet: 10 sec Gait steady and fast without use of assistive device  Cognitive Function: 6CIT completed    10/26/2018    9:52 AM  MMSE - Mini Mental State Exam  Orientation to time 5  Orientation to Place 5  Registration 3  Attention/ Calculation 5  Recall 3   Language- name 2 objects 2  Language- repeat 1  Language- follow 3 step command 3  Language- read & follow direction 1  Write a sentence 1  Copy design 1  Total score 30        02/23/2024   11:48 AM 10/28/2019    2:19 PM  6CIT Screen  What Year? 0 points 0 points  What month? 0 points 0 points  What time? 0 points 0 points  Count back from 20 0 points   Months in reverse 0 points   Repeat phrase 0 points   Total Score 0 points     Immunizations Immunization History  Administered Date(s) Administered   DTP 10/31/2010   Fluad Quad(high Dose 65+) 05/10/2019, 05/11/2020, 05/15/2021, 05/19/2022, 06/17/2023   Fluad Trivalent(High Dose 65+) 06/17/2023   Hep A / Hep B 08/06/2005, 01/28/2006, 07/17/2006   Hepatitis B 08/15/1992, 09/25/1992, 01/24/1993   Influenza Split 05/02/2013, 05/02/2014, 05/03/2015   Influenza, High Dose Seasonal PF 05/10/2019   Influenza,inj,quad, With Preservative 05/21/2017   Influenza-Unspecified 05/11/2018, 05/10/2019, 05/15/2021, 05/19/2022   Moderna Covid-19 Fall Seasonal Vaccine 46yrs & older 12/09/2022   Moderna Covid-19 Vaccine  Bivalent Booster 29yrs & up 01/28/2022   Moderna Sars-Covid-2 Vaccination 09/13/2019, 10/11/2019, 07/17/2020, 01/17/2021   Pfizer Covid-19 Vaccine Bivalent Booster 62yrs & up 05/23/2021   Pneumococcal Conjugate-13 01/30/2014   Pneumococcal Polysaccharide-23 05/21/2017   Rsv, Bivalent, Protein Subunit Rsvpref,pf [place]) 11/07/2022   Smallpox 03/16/1969   Tdap 10/26/2018   Unspecified SARS-COV-2 Vaccination 05/29/2023   Zoster Recombinant(Shingrix) 05/06/2018, 07/23/2018   Zoster, Live 09/08/2007    Screening Tests Health Maintenance  Topic Date Due   COVID-19 Vaccine (9 - Moderna risk 2024-25 season) 11/26/2023   INFLUENZA VACCINE  04/01/2024   Medicare Annual Wellness (AWV)  02/22/2025   Colonoscopy  11/30/2026   DTaP/Tdap/Td (3 - Td or Tdap) 10/26/2028   Pneumococcal Vaccine: 50+ Years  Completed   Hepatitis B  Vaccines  Completed   Zoster Vaccines- Shingrix  Completed   HPV VACCINES  Aged Out   Meningococcal B Vaccine  Aged Out   Hepatitis C Screening  Discontinued    Health Maintenance  Health Maintenance Due  Topic Date Due   COVID-19 Vaccine (9 - Moderna risk 2024-25 season) 11/26/2023   Health Maintenance Items Addressed: Referral to Audiology for hearing test Pt says he will receive/scheduled Prevnar-20 from his pharmacy next week.  Additional Screening:  Vision Screening: Recommended annual ophthalmology exams for early detection of glaucoma and other disorders of the eye. Would you like a referral to an eye doctor? No    Dental Screening: Recommended annual dental exams for proper oral hygiene  Community Resource Referral / Chronic  Care Management: CRR required this visit?  No   CCM required this visit?  No   Plan:    I have personally reviewed and noted the following in the patient's chart:   Medical and social history Use of alcohol, tobacco or illicit drugs  Current medications and supplements including opioid prescriptions. Patient is not currently taking opioid prescriptions. Functional ability and status Nutritional status Physical activity Advanced directives List of other physicians Hospitalizations, surgeries, and ER visits in previous 12 months Vitals Screenings to include cognitive, depression, and falls Referrals and appointments  In addition, I have reviewed and discussed with patient certain preventive protocols, quality metrics, and best practice recommendations. A written personalized care plan for preventive services as well as general preventive health recommendations were provided to patient.   Erminio LITTIE Saris, LPN   3/75/7974   After Visit Summary: (In Person-Declined) Patient declined AVS at this time.  Notes: Nothing significant to report at this time.

## 2024-02-24 ENCOUNTER — Ambulatory Visit: Payer: Self-pay | Admitting: Internal Medicine

## 2024-02-25 DIAGNOSIS — M7502 Adhesive capsulitis of left shoulder: Secondary | ICD-10-CM | POA: Diagnosis not present

## 2024-02-25 DIAGNOSIS — M25512 Pain in left shoulder: Secondary | ICD-10-CM | POA: Diagnosis not present

## 2024-03-01 DIAGNOSIS — M7502 Adhesive capsulitis of left shoulder: Secondary | ICD-10-CM | POA: Diagnosis not present

## 2024-03-01 DIAGNOSIS — M25512 Pain in left shoulder: Secondary | ICD-10-CM | POA: Diagnosis not present

## 2024-03-03 DIAGNOSIS — M7502 Adhesive capsulitis of left shoulder: Secondary | ICD-10-CM | POA: Diagnosis not present

## 2024-03-03 DIAGNOSIS — M25512 Pain in left shoulder: Secondary | ICD-10-CM | POA: Diagnosis not present

## 2024-03-08 DIAGNOSIS — M25512 Pain in left shoulder: Secondary | ICD-10-CM | POA: Diagnosis not present

## 2024-03-08 DIAGNOSIS — M7502 Adhesive capsulitis of left shoulder: Secondary | ICD-10-CM | POA: Diagnosis not present

## 2024-03-10 ENCOUNTER — Ambulatory Visit: Attending: Internal Medicine | Admitting: Audiologist

## 2024-03-10 DIAGNOSIS — M7502 Adhesive capsulitis of left shoulder: Secondary | ICD-10-CM | POA: Diagnosis not present

## 2024-03-10 DIAGNOSIS — M25512 Pain in left shoulder: Secondary | ICD-10-CM | POA: Diagnosis not present

## 2024-03-10 DIAGNOSIS — H903 Sensorineural hearing loss, bilateral: Secondary | ICD-10-CM | POA: Diagnosis not present

## 2024-03-10 NOTE — Procedures (Signed)
  Outpatient Audiology and [facility] [address] [place], [place]  [address] [phone]  AUDIOLOGICAL  EVALUATION  NAME: Kevin Terrell     DOB:   Feb 16, 1941      MRN: [id number]                                                                                     DATE: 03/10/2024     REFERENT: Jimmy Charlie FERNS, MD STATUS: Outpatient DIAGNOSIS: Sensorineural Hearing Loss Bilateral    History: Kayman was seen for an audiological evaluation due to difficulty hearing the beep of a bug zapper racket. He noticed he hears it in his right ear but not the left.  Wilson denies pain, pressure, or tinnitus.  Raymond has significant history of hazardous noise exposure. He is a right handed shooter and has used firearms recreationally throughout his life. 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   Medical history shows no additional risk for hearing loss.    Evaluation:  Otoscopy showed a no view of the tympanic membranes due to deep dried cerumen build up, bilaterally Tympanometry results were consistent with normal middle ear function, bilaterally   Audiometric testing was completed using Conventional Audiometry techniques with insert earphones and supraural headphones. Test results are consistent with normal hearing sloping after 2kHz to mild to moderate sensorineural hearing loss. Asymmetric thresholds 3-6kHz with left ear worse. Speech Recognition Thresholds were obtained at  10dB HL in the right ear and at 10dB HL in the left ear. Word Recognition Testing was completed at  40dB SL and Fairy scored 100% in each ear at 50dB.    Results:  The test results were reviewed with Fairy. He has a mild to moderate high pitched .shl hearing loss. The loss is worse in the left ear. He does not need hearing aids due to normal hearing at [facility].  Audiogram printed and provided to Mon[place].    Recommendations: Annual audiometric testing recommended to monitor  hearing loss for progression of asymmetry    24 minutes spent testing and counseling on results.   If you have any questions please feel free to contact me at [phone].  Lauraine Ka Stalnaker Au.D.  Audiologist   03/10/2024  1:47 PM  Cc: Jimmy Charlie FERNS, MD

## 2024-03-17 DIAGNOSIS — M25512 Pain in left shoulder: Secondary | ICD-10-CM | POA: Diagnosis not present

## 2024-03-17 DIAGNOSIS — M7502 Adhesive capsulitis of left shoulder: Secondary | ICD-10-CM | POA: Diagnosis not present

## 2024-03-21 ENCOUNTER — Other Ambulatory Visit (HOSPITAL_COMMUNITY): Payer: Self-pay

## 2024-03-21 ENCOUNTER — Telehealth: Payer: Self-pay | Admitting: Internal Medicine

## 2024-03-21 DIAGNOSIS — Z79899 Other long term (current) drug therapy: Secondary | ICD-10-CM

## 2024-03-21 DIAGNOSIS — E7849 Other hyperlipidemia: Secondary | ICD-10-CM

## 2024-03-21 MED ORDER — ROSUVASTATIN CALCIUM 20 MG PO TABS
20.0000 mg | ORAL_TABLET | Freq: Every day | ORAL | 3 refills | Status: AC
Start: 2024-03-21 — End: ?
  Filled 2024-03-21: qty 90, 90d supply, fill #0

## 2024-03-21 NOTE — Telephone Encounter (Signed)
 Pt called in asking if Dr. Mady can review his most recent lipid labs from PCP (in EP[facility]) and let him know if he needs to change any of his medications. Please advise.

## 2024-03-21 NOTE — Telephone Encounter (Signed)
 LDL little changed since last check and still just above our goal of less than 70.  If Kevin Terrell is tolerating rosuvastatin  10 mg daily well, I recommend increasing this to 20 mg daily and rechecking a lipid panel and ALT in ~3 months.  Kevin Hanson, MD [facility]

## 2024-03-21 NOTE — Telephone Encounter (Signed)
 The patient has been notified of the result along with recommendations. Pt verbalized understanding. All questions (if any) were answered.  Medication list updated and repeat lab order placed

## 2024-03-24 ENCOUNTER — Other Ambulatory Visit (HOSPITAL_COMMUNITY): Payer: Self-pay

## 2024-03-24 DIAGNOSIS — M7522 Bicipital tendinitis, left shoulder: Secondary | ICD-10-CM | POA: Diagnosis not present

## 2024-03-24 DIAGNOSIS — M24012 Loose body in left shoulder: Secondary | ICD-10-CM | POA: Diagnosis not present

## 2024-03-24 DIAGNOSIS — M67922 Unspecified disorder of synovium and tendon, left upper arm: Secondary | ICD-10-CM | POA: Diagnosis not present

## 2024-03-24 DIAGNOSIS — M19012 Primary osteoarthritis, left shoulder: Secondary | ICD-10-CM | POA: Diagnosis not present

## 2024-03-24 DIAGNOSIS — G8929 Other chronic pain: Secondary | ICD-10-CM | POA: Diagnosis not present

## 2024-03-29 DIAGNOSIS — M7502 Adhesive capsulitis of left shoulder: Secondary | ICD-10-CM | POA: Diagnosis not present

## 2024-03-29 DIAGNOSIS — M25512 Pain in left shoulder: Secondary | ICD-10-CM | POA: Diagnosis not present

## 2024-03-31 DIAGNOSIS — M25512 Pain in left shoulder: Secondary | ICD-10-CM | POA: Diagnosis not present

## 2024-03-31 DIAGNOSIS — M7502 Adhesive capsulitis of left shoulder: Secondary | ICD-10-CM | POA: Diagnosis not present

## 2024-04-01 ENCOUNTER — Ambulatory Visit (INDEPENDENT_AMBULATORY_CARE_PROVIDER_SITE_OTHER): Payer: Self-pay

## 2024-04-01 DIAGNOSIS — B351 Tinea unguium: Secondary | ICD-10-CM

## 2024-04-01 NOTE — Progress Notes (Signed)
 Patient presents today for laser maintenance treatment. 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   Toenail most affected 1-5 bilaterally.  All other systems are negative.  Nails were filed thin. Laser therapy was administered to 1-5 toenails bilaterally and patient tolerated the treatment well. All safety precautions were in place.    Follow up in 3 months for laser maintenance.

## 2024-04-05 DIAGNOSIS — M7502 Adhesive capsulitis of left shoulder: Secondary | ICD-10-CM | POA: Diagnosis not present

## 2024-04-05 DIAGNOSIS — M25512 Pain in left shoulder: Secondary | ICD-10-CM | POA: Diagnosis not present

## 2024-04-14 DIAGNOSIS — M7502 Adhesive capsulitis of left shoulder: Secondary | ICD-10-CM | POA: Diagnosis not present

## 2024-04-14 DIAGNOSIS — M25512 Pain in left shoulder: Secondary | ICD-10-CM | POA: Diagnosis not present

## 2024-04-19 DIAGNOSIS — M25512 Pain in left shoulder: Secondary | ICD-10-CM | POA: Diagnosis not present

## 2024-04-19 DIAGNOSIS — M7502 Adhesive capsulitis of left shoulder: Secondary | ICD-10-CM | POA: Diagnosis not present

## 2024-04-21 DIAGNOSIS — M24012 Loose body in left shoulder: Secondary | ICD-10-CM | POA: Diagnosis not present

## 2024-04-21 DIAGNOSIS — M19012 Primary osteoarthritis, left shoulder: Secondary | ICD-10-CM | POA: Diagnosis not present

## 2024-04-21 DIAGNOSIS — G8929 Other chronic pain: Secondary | ICD-10-CM | POA: Diagnosis not present

## 2024-04-26 ENCOUNTER — Ambulatory Visit: Payer: Medicare Other | Admitting: Dermatology

## 2024-04-26 ENCOUNTER — Other Ambulatory Visit: Payer: Self-pay | Admitting: Internal Medicine

## 2024-04-26 ENCOUNTER — Encounter: Payer: Self-pay | Admitting: Dermatology

## 2024-04-26 DIAGNOSIS — W908XXA Exposure to other nonionizing radiation, initial encounter: Secondary | ICD-10-CM

## 2024-04-26 DIAGNOSIS — L570 Actinic keratosis: Secondary | ICD-10-CM

## 2024-04-26 DIAGNOSIS — Z1283 Encounter for screening for malignant neoplasm of skin: Secondary | ICD-10-CM | POA: Diagnosis not present

## 2024-04-26 DIAGNOSIS — M7502 Adhesive capsulitis of left shoulder: Secondary | ICD-10-CM | POA: Diagnosis not present

## 2024-04-26 DIAGNOSIS — L578 Other skin changes due to chronic exposure to nonionizing radiation: Secondary | ICD-10-CM

## 2024-04-26 DIAGNOSIS — Z902 Acquired absence of lung [part of]: Secondary | ICD-10-CM

## 2024-04-26 DIAGNOSIS — L814 Other melanin hyperpigmentation: Secondary | ICD-10-CM | POA: Diagnosis not present

## 2024-04-26 DIAGNOSIS — L820 Inflamed seborrheic keratosis: Secondary | ICD-10-CM

## 2024-04-26 DIAGNOSIS — M25512 Pain in left shoulder: Secondary | ICD-10-CM | POA: Diagnosis not present

## 2024-04-26 DIAGNOSIS — D229 Melanocytic nevi, unspecified: Secondary | ICD-10-CM

## 2024-04-26 DIAGNOSIS — Z85828 Personal history of other malignant neoplasm of skin: Secondary | ICD-10-CM | POA: Diagnosis not present

## 2024-04-26 DIAGNOSIS — D1801 Hemangioma of skin and subcutaneous tissue: Secondary | ICD-10-CM | POA: Diagnosis not present

## 2024-04-26 DIAGNOSIS — L821 Other seborrheic keratosis: Secondary | ICD-10-CM | POA: Diagnosis not present

## 2024-04-26 NOTE — Patient Instructions (Signed)

## 2024-04-26 NOTE — Progress Notes (Signed)
 Follow-Up Visit   Subjective  Kevin Terrell is a 83 y.o. male who presents for the following: Skin Cancer Screening and Full Body Skin Exam  The patient presents for Total-Body Skin Exam (TBSE) for skin cancer screening and mole check. The patient has spots, moles and lesions to be evaluated, some may be new or changing. Pt has noticed a skin lesion on the L occipital scalp that he would like checked today.   The following portions of the chart were reviewed this encounter and updated as appropriate: medications, allergies, medical history  Review of Systems:  No other skin or systemic complaints except as noted in HPI or Assessment and Plan.  Objective  Well appearing patient in no apparent distress; mood and affect are within normal limits.  A full examination was performed including scalp, head, eyes, ears, nose, lips, neck, chest, axillae, abdomen, back, buttocks, bilateral upper extremities, bilateral lower extremities, hands, feet, fingers, toes, fingernails, and toenails. All findings within normal limits unless otherwise noted below.   Relevant physical exam findings are noted in the Assessment and Plan.  L postauricular x 2 (2) Erythematous keratotic or waxy stuck-on papule or plaque. L sup forehead x 3, R lat mid forehead x 1, R temple x 1 (5) Erythematous thin papules/macules with gritty scale.   Assessment & Plan   SKIN CANCER SCREENING PERFORMED TODAY.  ACTINIC DAMAGE - Chronic condition, secondary to cumulative UV/sun exposure - diffuse scaly erythematous macules with underlying dyspigmentation - Recommend daily broad spectrum sunscreen SPF 30+ to sun-exposed areas, reapply every 2 hours as needed.  - Staying in the shade or wearing long sleeves, sun glasses (UVA+UVB protection) and wide brim hats (4-inch brim around the entire circumference of the hat) are also recommended for sun protection.  - Call for new or changing lesions.  LENTIGINES, SEBORRHEIC KERATOSES,  HEMANGIOMAS - Benign normal skin lesions - Benign-appearing - Call for any changes  MELANOCYTIC NEVI - Tan-brown and/or pink-flesh-colored symmetric macules and papules - Benign appearing on exam today - Observation - Call clinic for new or changing moles - Recommend daily use of broad spectrum spf 30+ sunscreen to sun-exposed areas.   HISTORY OF SQUAMOUS CELL CARCINOMA OF THE SKIN - No evidence of recurrence today - No lymphadenopathy - Recommend regular full body skin exams - Recommend daily broad spectrum sunscreen SPF 30+ to sun-exposed areas, reapply every 2 hours as needed.  - Call if any new or changing lesions are noted between office visits INFLAMED SEBORRHEIC KERATOSIS (2) L postauricular x 2 (2) Symptomatic, irritating, patient would like treated. Destruction of lesion - L postauricular x 2 (2) Complexity: simple   Destruction method: cryotherapy   Informed consent: discussed and consent obtained   Timeout:  patient name, date of birth, surgical site, and procedure verified Lesion destroyed using liquid nitrogen: Yes   Region frozen until ice ball extended beyond lesion: Yes   Cryo cycles: 1 or 2. Outcome: patient tolerated procedure well with no complications   Post-procedure details: wound care instructions given    AK (ACTINIC KERATOSIS) (5) L sup forehead x 3, R lat mid forehead x 1, R temple x 1 (5) Actinic keratoses are precancerous spots that appear secondary to cumulative UV radiation exposure/sun exposure over time. They are chronic with expected duration over 1 year. A portion of actinic keratoses will progress to squamous cell carcinoma of the skin. It is not possible to reliably predict which spots will progress to skin cancer and so treatment is  recommended to prevent development of skin cancer.  Recommend daily broad spectrum sunscreen SPF 30+ to sun-exposed areas, reapply every 2 hours as needed.  Recommend staying in the shade or wearing long sleeves,  sun glasses (UVA+UVB protection) and wide brim hats (4-inch brim around the entire circumference of the hat). Call for new or changing lesions.  Destruction of lesion - L sup forehead x 3, R lat mid forehead x 1, R temple x 1 (5) Complexity: simple   Destruction method: cryotherapy   Informed consent: discussed and consent obtained   Timeout:  patient name, date of birth, surgical site, and procedure verified Lesion destroyed using liquid nitrogen: Yes   Region frozen until ice ball extended beyond lesion: Yes   Cryo cycles: 1 or 2. Outcome: patient tolerated procedure well with no complications   Post-procedure details: wound care instructions given    MULTIPLE BENIGN NEVI   LENTIGINES   ACTINIC ELASTOSIS   SEBORRHEIC KERATOSES   CHERRY ANGIOMA    Return in about 6 months (around 10/27/2024) for AK and ISK follow up.  LILLETTE Rosina Mayans, CMA, am acting as scribe for Boneta Sharps, MD .  Documentation: I have reviewed the above documentation for accuracy and completeness, and I agree with the above.  Boneta Sharps, MD

## 2024-04-28 DIAGNOSIS — M25512 Pain in left shoulder: Secondary | ICD-10-CM | POA: Diagnosis not present

## 2024-04-28 DIAGNOSIS — M19012 Primary osteoarthritis, left shoulder: Secondary | ICD-10-CM | POA: Diagnosis not present

## 2024-04-28 DIAGNOSIS — M24012 Loose body in left shoulder: Secondary | ICD-10-CM | POA: Diagnosis not present

## 2024-04-28 DIAGNOSIS — G8929 Other chronic pain: Secondary | ICD-10-CM | POA: Diagnosis not present

## 2024-04-28 DIAGNOSIS — M7502 Adhesive capsulitis of left shoulder: Secondary | ICD-10-CM | POA: Diagnosis not present

## 2024-05-03 DIAGNOSIS — M7502 Adhesive capsulitis of left shoulder: Secondary | ICD-10-CM | POA: Diagnosis not present

## 2024-05-03 DIAGNOSIS — M25512 Pain in left shoulder: Secondary | ICD-10-CM | POA: Diagnosis not present

## 2024-05-05 DIAGNOSIS — M19012 Primary osteoarthritis, left shoulder: Secondary | ICD-10-CM | POA: Diagnosis not present

## 2024-05-05 DIAGNOSIS — M7502 Adhesive capsulitis of left shoulder: Secondary | ICD-10-CM | POA: Diagnosis not present

## 2024-05-05 DIAGNOSIS — M25512 Pain in left shoulder: Secondary | ICD-10-CM | POA: Diagnosis not present

## 2024-05-10 DIAGNOSIS — M7502 Adhesive capsulitis of left shoulder: Secondary | ICD-10-CM | POA: Diagnosis not present

## 2024-05-10 DIAGNOSIS — M25512 Pain in left shoulder: Secondary | ICD-10-CM | POA: Diagnosis not present

## 2024-05-17 ENCOUNTER — Telehealth: Payer: Self-pay

## 2024-05-17 DIAGNOSIS — Z23 Encounter for immunization: Secondary | ICD-10-CM | POA: Diagnosis not present

## 2024-05-17 DIAGNOSIS — M25512 Pain in left shoulder: Secondary | ICD-10-CM | POA: Diagnosis not present

## 2024-05-17 DIAGNOSIS — M7502 Adhesive capsulitis of left shoulder: Secondary | ICD-10-CM | POA: Diagnosis not present

## 2024-05-17 NOTE — Telephone Encounter (Signed)
 Usually refer to [facility].  Thanks.

## 2024-05-17 NOTE — Telephone Encounter (Signed)
 Copied from CRM [id number]. Topic: General - Other >> May 17, 2024  2:58 PM Rilla B wrote: Reason for CRM: Patient states his PCP Jimmy retired and will be seeing Dr Cleatus.  Patient will reach out to his PCP as he thinks he needs a sleep study. >> May 17, 2024  3:13 PM [place] J wrote: Although the patient has not finalized his transfer of care with Dr. Cleatus, he would like to know where he would usually refer patient's out to when they are coming to him with concerns for sleep apnea.   Please reach out to the patient with any available update, as he is aware of the next day turn round time.

## 2024-05-19 DIAGNOSIS — M7502 Adhesive capsulitis of left shoulder: Secondary | ICD-10-CM | POA: Diagnosis not present

## 2024-05-19 DIAGNOSIS — M25512 Pain in left shoulder: Secondary | ICD-10-CM | POA: Diagnosis not present

## 2024-05-19 NOTE — Telephone Encounter (Signed)
 Patient notified

## 2024-05-24 DIAGNOSIS — M25512 Pain in left shoulder: Secondary | ICD-10-CM | POA: Diagnosis not present

## 2024-05-24 DIAGNOSIS — M7502 Adhesive capsulitis of left shoulder: Secondary | ICD-10-CM | POA: Diagnosis not present

## 2024-05-26 DIAGNOSIS — M25512 Pain in left shoulder: Secondary | ICD-10-CM | POA: Diagnosis not present

## 2024-05-26 DIAGNOSIS — M7502 Adhesive capsulitis of left shoulder: Secondary | ICD-10-CM | POA: Diagnosis not present

## 2024-05-31 DIAGNOSIS — M25512 Pain in left shoulder: Secondary | ICD-10-CM | POA: Diagnosis not present

## 2024-05-31 DIAGNOSIS — M7502 Adhesive capsulitis of left shoulder: Secondary | ICD-10-CM | POA: Diagnosis not present

## 2024-06-01 DIAGNOSIS — Z23 Encounter for immunization: Secondary | ICD-10-CM | POA: Diagnosis not present

## 2024-06-02 DIAGNOSIS — M7502 Adhesive capsulitis of left shoulder: Secondary | ICD-10-CM | POA: Diagnosis not present

## 2024-06-02 DIAGNOSIS — M25512 Pain in left shoulder: Secondary | ICD-10-CM | POA: Diagnosis not present

## 2024-06-07 DIAGNOSIS — M25512 Pain in left shoulder: Secondary | ICD-10-CM | POA: Diagnosis not present

## 2024-06-07 DIAGNOSIS — M7502 Adhesive capsulitis of left shoulder: Secondary | ICD-10-CM | POA: Diagnosis not present

## 2024-06-14 DIAGNOSIS — M7502 Adhesive capsulitis of left shoulder: Secondary | ICD-10-CM | POA: Diagnosis not present

## 2024-06-14 DIAGNOSIS — M25512 Pain in left shoulder: Secondary | ICD-10-CM | POA: Diagnosis not present

## 2024-06-16 ENCOUNTER — Other Ambulatory Visit: Payer: Self-pay

## 2024-06-16 ENCOUNTER — Other Ambulatory Visit: Payer: Self-pay | Admitting: Family Medicine

## 2024-06-16 DIAGNOSIS — E039 Hypothyroidism, unspecified: Secondary | ICD-10-CM

## 2024-06-16 MED ORDER — LEVOTHYROXINE SODIUM 50 MCG PO TABS: 50.0000 ug | ORAL_TABLET | Freq: Every day | ORAL | 0 refills | Status: AC

## 2024-06-17 ENCOUNTER — Encounter: Admitting: Family Medicine

## 2024-06-17 ENCOUNTER — Ambulatory Visit

## 2024-06-24 ENCOUNTER — Encounter: Payer: Medicare Other | Admitting: Family Medicine

## 2024-06-28 ENCOUNTER — Encounter: Payer: Self-pay | Admitting: Family Medicine

## 2024-06-28 ENCOUNTER — Ambulatory Visit: Admitting: Family Medicine

## 2024-06-28 VITALS — BP 118/66 | HR 63 | Temp 98.0°F | Ht 73.0 in | Wt 195.5 lb

## 2024-06-28 DIAGNOSIS — I2510 Atherosclerotic heart disease of native coronary artery without angina pectoris: Secondary | ICD-10-CM | POA: Diagnosis not present

## 2024-06-28 DIAGNOSIS — M160 Bilateral primary osteoarthritis of hip: Secondary | ICD-10-CM | POA: Diagnosis not present

## 2024-06-28 DIAGNOSIS — T451X5A Adverse effect of antineoplastic and immunosuppressive drugs, initial encounter: Secondary | ICD-10-CM

## 2024-06-28 DIAGNOSIS — G620 Drug-induced polyneuropathy: Secondary | ICD-10-CM | POA: Diagnosis not present

## 2024-06-28 DIAGNOSIS — K148 Other diseases of tongue: Secondary | ICD-10-CM

## 2024-06-28 DIAGNOSIS — E7800 Pure hypercholesterolemia, unspecified: Secondary | ICD-10-CM

## 2024-06-28 DIAGNOSIS — Z7189 Other specified counseling: Secondary | ICD-10-CM

## 2024-06-28 NOTE — Progress Notes (Unsigned)
 Transfer of care.    H/o neuropathy.  On gabapentin .  H/o chemo for lung cancer.  Has done PT prev for balance.  No ADE on gabapentin .   H/o shoulder arthritis.  He needs help with getting dressed at baseline.  He is compensating otherwise.  He had prev injections in his knees.  He had seen Dr. Sharrie.    Prev calcium  scoring d/w pt.  Coronary calcium  score of 1031. This was 71st percentile for age and sex matched control. D/w pt about statin use.   Using medications without problems: yes Muscle aches: no   Diet compliance: yes Exercise: yes F/u labs pending.    Daughter Camie designated if patient were incapacitated.  He has pulmonary CT pending.  Discussed.  He noted a small white lesion on the tongue- has eval pending for today.   Meds, vitals, and allergies reviewed.   ROS: Per HPI unless specifically indicated in ROS section   GEN: nad, alert and oriented HEENT: mucous membranes moist, he has a small whitish patch much less than 1 cm across on the tip of the tongue without ulceration. NECK: supple w/o LA CV: rrr.  PULM: ctab, no inc wob ABD: soft, +bs EXT: no edema SKIN: Well-perfused.  35 minutes were devoted to patient care in this encounter (this includes time spent reviewing the patient's file/history, interviewing and examining the patient, counseling/reviewing plan with patient).

## 2024-06-28 NOTE — Patient Instructions (Signed)
Update me as needed.  Take care.  Glad to see you.   

## 2024-06-29 DIAGNOSIS — Z7189 Other specified counseling: Secondary | ICD-10-CM | POA: Insufficient documentation

## 2024-06-29 DIAGNOSIS — K148 Other diseases of tongue: Secondary | ICD-10-CM | POA: Insufficient documentation

## 2024-06-29 NOTE — Assessment & Plan Note (Signed)
 Daughter Camie designated if patient were incapacitated.

## 2024-06-29 NOTE — Assessment & Plan Note (Signed)
 He had previous knee injections with relief.  Per outside clinic.  I will defer.

## 2024-06-29 NOTE — Assessment & Plan Note (Signed)
 Not having chest pain.  Previous CT images reviewed with patient.  Continue work on diet and exercise.  Continue Crestor .

## 2024-06-29 NOTE — Assessment & Plan Note (Signed)
 He noted a small white lesion on the tongue- has eval pending for today with oral surgery.  Will await that note.  The lesion is getting smaller in the meantime.

## 2024-06-29 NOTE — Assessment & Plan Note (Signed)
 Continue statin.  Continue work on diet and exercise.  Follow-up labs are pending.

## 2024-06-29 NOTE — Assessment & Plan Note (Signed)
 Would continue gabapentin .  He has done PT for balance previously.

## 2024-07-08 ENCOUNTER — Ambulatory Visit

## 2024-07-08 DIAGNOSIS — M24012 Loose body in left shoulder: Secondary | ICD-10-CM | POA: Diagnosis not present

## 2024-07-08 DIAGNOSIS — G8929 Other chronic pain: Secondary | ICD-10-CM | POA: Diagnosis not present

## 2024-07-08 DIAGNOSIS — M67922 Unspecified disorder of synovium and tendon, left upper arm: Secondary | ICD-10-CM | POA: Diagnosis not present

## 2024-07-08 DIAGNOSIS — M7522 Bicipital tendinitis, left shoulder: Secondary | ICD-10-CM | POA: Diagnosis not present

## 2024-07-08 DIAGNOSIS — M19012 Primary osteoarthritis, left shoulder: Secondary | ICD-10-CM | POA: Diagnosis not present

## 2024-07-14 ENCOUNTER — Other Ambulatory Visit: Payer: Self-pay

## 2024-07-14 DIAGNOSIS — M1990 Unspecified osteoarthritis, unspecified site: Secondary | ICD-10-CM

## 2024-07-14 MED ORDER — MELOXICAM 15 MG PO TABS: 15.0000 mg | ORAL_TABLET | Freq: Every day | ORAL | 1 refills | Status: AC | PRN

## 2024-07-15 ENCOUNTER — Other Ambulatory Visit: Payer: Self-pay | Admitting: Family Medicine

## 2024-07-15 ENCOUNTER — Ambulatory Visit
Admission: RE | Admit: 2024-07-15 | Discharge: 2024-07-15 | Disposition: A | Discharge status: Home / Self Care | Source: Ambulatory Visit | Attending: Student in an Organized Health Care Education/Training Program | Admitting: Student in an Organized Health Care Education/Training Program

## 2024-07-15 DIAGNOSIS — Z902 Acquired absence of lung [part of]: Secondary | ICD-10-CM

## 2024-07-15 DIAGNOSIS — R053 Chronic cough: Secondary | ICD-10-CM | POA: Diagnosis not present

## 2024-07-15 DIAGNOSIS — J479 Bronchiectasis, uncomplicated: Secondary | ICD-10-CM | POA: Diagnosis not present

## 2024-07-15 DIAGNOSIS — I700 Atherosclerosis of aorta: Secondary | ICD-10-CM | POA: Diagnosis not present

## 2024-07-15 DIAGNOSIS — R911 Solitary pulmonary nodule: Secondary | ICD-10-CM | POA: Diagnosis not present

## 2024-07-20 ENCOUNTER — Encounter: Payer: Self-pay | Admitting: Student in an Organized Health Care Education/Training Program

## 2024-07-20 ENCOUNTER — Ambulatory Visit (INDEPENDENT_AMBULATORY_CARE_PROVIDER_SITE_OTHER): Admitting: Student in an Organized Health Care Education/Training Program

## 2024-07-20 ENCOUNTER — Other Ambulatory Visit: Payer: Self-pay

## 2024-07-20 VITALS — BP 102/70 | HR 56 | Temp 98.0°F | Ht 73.0 in | Wt 193.0 lb

## 2024-07-20 DIAGNOSIS — J310 Chronic rhinitis: Secondary | ICD-10-CM | POA: Diagnosis not present

## 2024-07-20 DIAGNOSIS — J8410 Pulmonary fibrosis, unspecified: Secondary | ICD-10-CM | POA: Diagnosis not present

## 2024-07-20 DIAGNOSIS — R053 Chronic cough: Secondary | ICD-10-CM

## 2024-07-20 DIAGNOSIS — Z79899 Other long term (current) drug therapy: Secondary | ICD-10-CM | POA: Diagnosis not present

## 2024-07-20 NOTE — Patient Instructions (Signed)
  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 Your recent high-resolution chest CT showed mild fibrotic changes, but no progression of ground glass opacities or neoplastic changes. Your chronic cough has significantly improved with dietary intervention, and you have no new respiratory symptoms. You also discussed concerns about possible sleep apnea due to observed snoring and pauses in breathing during sleep.  YOUR PLAN: -STABLE POST-INFECTIOUS PULMONARY FIBROSIS: This condition involves scarring of the lung tissue following an infection. Your recent chest CT shows mild fibrotic changes without any progression. We will repeat the chest CT in one year to monitor for any changes.  -CHRONIC COUGH, IMPROVED: Your chronic cough has significantly improved with dietary changes and now occurs infrequently. You should continue with your current management plan.  -CHRONIC ALLERGIC RHINITIS: This condition involves inflammation of the nasal passages due to allergies, leading to symptoms like post-nasal drip and occasional cough. Continue taking montelukast , cetirizine, and using fluticasone  nasal spray as needed.  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 You have been referred to a sleep specialist for further evaluation and a potential sleep study.  INSTRUCTIONS: Please schedule a follow-up chest CT in one year to monitor your pulmonary fibrosis. Continue your current medications for chronic cough and allergic rhinitis. Follow up with the sleep specialist for evaluation of possible sleep apnea.       Contains text generated by [employer].

## 2024-07-20 NOTE — Progress Notes (Signed)
 Assessment & Plan:   Assessment & Plan  #Stable post-infectious pulmonary fibrosis  Mild fibrotic changes on high-resolution chest CT, likely post-infectious scarring. No progression of ground glass opacities or neoplastic changes. He is asymptomatic and his PFT's were within normal during our initial check. This is consistent with post-infectious inflammatory fibrosis which we will continue to monitor with a repeat CT scan in one year.  - Repeat chest CT in one year to monitor for progression. - CT CHEST HIGH RESOLUTION; Future  #Chronic cough, improved  Chronic cough significantly improved with dietary intervention. Occasional episodes every four to five days, associated with post-nasal drip due to allergies. PFT's fully within normal. While there was a 240 mL improvement in FEV1 with albuterol , he did not feel any difference in symptoms. Both pre and post bronchodilator FEV1 were within normal. FEV1/FVC ratio post bronchodilator challenge was 0.71, precluding a diagnosis of COPD. Given improvement in cough, and no dyspnea, we will continue to manage conservatively.  #Chronic allergic rhinitis  Managed with montelukast , cetirizine, and fluticasone  nasal spray. Symptoms include post-nasal drip contributing to occasional cough. Continue as prescribed.  #Suspected obstructive sleep apnea  Reports of snoring and observed apneic episodes suggest potential benefit from evaluation.  - Referred to sleep specialist for evaluation and potential sleep study.     Return in about 1 year (around 07/20/2025).  Belva November, MD [facility]  I spent 30 minutes caring for this patient today, including preparing to see the patient, obtaining a medical history , reviewing a separately obtained history, performing a medically appropriate examination and/or evaluation, counseling and educating the patient/family/caregiver, documenting clinical information in the electronic  health record, and independently interpreting results (not separately reported/billed) and communicating results to the patient/family/caregiver  End of visit medications:  No orders of the defined types were placed in this encounter.    Current Outpatient Medications:    albuterol  (VENTOLIN  HFA) 108 (90 Base) MCG/ACT inhaler, Inhale 1-2 puffs into the lungs every 6 (six) hours as needed., Disp: 8 g, Rfl: 0   aspirin  EC 81 MG tablet, Take 1 tablet (81 mg total) by mouth daily. Swallow whole., Disp: , Rfl:    cetirizine (ZYRTEC) 10 MG tablet, Take 10 mg by mouth daily., Disp: , Rfl:    clotrimazole -betamethasone  (LOTRISONE ) cream, Apply 1 Application topically 2 (two) times daily., Disp: 30 g, Rfl: 0   diclofenac  Sodium (VOLTAREN ) 1 % GEL, APPLY FOUR GRAMS TO BOTH KNEES FOUR TIMES DAILY AS NEEDED, Disp: 600 g, Rfl: 4   famotidine  (PEPCID ) 20 MG tablet, TAKE ONE TABLET BY MOUTH EVERY NIGHT AT BEDTIME, Disp: 90 tablet, Rfl: 0   fexofenadine (ALLEGRA) 180 MG tablet, Take 180 mg by mouth daily., Disp: , Rfl:    fluticasone  (FLONASE ) 50 MCG/ACT nasal spray, Place 1 spray into both nostrils 2 (two) times daily., Disp: , Rfl:    gabapentin  (NEURONTIN ) 300 MG capsule, TAKE ONE CAPSULE BY MOUTH TWICE DAILY, Disp: 180 capsule, Rfl: 3   ketoconazole (NIZORAL) 2 % cream, APPLY A SMALL AMOUNT TO AFFECTED AREA TWICE DAILY, Disp: 60 g, Rfl: 3   levothyroxine  (SYNTHROID ) 50 MCG tablet, Take 1 tablet (50 mcg total) by mouth daily before breakfast., Disp: 30 tablet, Rfl: 0   Liniments (DEEP BLUE RELIEF) GEL, Apply topically., Disp: , Rfl:    meloxicam  (MOBIC ) 15 MG tablet, Take 1 tablet (15 mg total) by mouth daily as needed., Disp: 90 tablet, Rfl: 1   montelukast  (SINGULAIR ) 10 MG  tablet, TAKE ONE TABLET (10 MG TOTAL) BY MOUTH AT BEDTIME., Disp: 90 tablet, Rfl: 0   Multiple Vitamin (MULTIVITAMIN WITH MINERALS) TABS tablet, Take 1 tablet by mouth daily., Disp: , Rfl:    mupirocin  ointment (BACTROBAN ) 2 %, Apply  twice daily as needed, Disp: 22 g, Rfl: 1   Probiotic Product (PROBIOTIC PO), Take 1 capsule by mouth 2 (two) times daily., Disp: , Rfl:    rosuvastatin  (CRESTOR ) 20 MG tablet, Take 1 tablet (20 mg total) by mouth daily., Disp: 90 tablet, Rfl: 3   sildenafil  (VIAGRA ) 100 MG tablet, TAKE ONE TABLET BY MOUTH ONCE A DAY AS NEEDED FOR ERECTILE DYSFUNCTION., Disp: 10 tablet, Rfl: 11   triamcinolone  cream (KENALOG ) 0.1 %, APPLY TO HANDS AT BEDTIME AND COVER WITH GLOVES AS NEEDED FOR FLARES. AVOID FACE, GROIN, AXILLA., Disp: 80 g, Rfl: 0   Subjective:   PATIENT ID: Kevin Terrell GENDER: male DOB: 1940/09/30, MRN: [id number]  Chief Complaint  Patient presents with   Medical Management of Chronic Issues    Here to follow up on CT scan. No cough, shortness of breath or wheezing.     HPI  Discussed the use of AI scribe software for clinical note transcription with the patient, who gave verbal consent to proceed.  Discussed the use of AI scribe software for clinical note transcription with the patient, who gave verbal consent to proceed.  History of Present Illness Kevin Terrell is an 83 year old male with post-infectious pulmonary fibrosis who presents for follow-up of stable fibrotic changes on chest CT.  Return Visit 02/12/2024:  During our initial visit (01/15/2024), he reported symptom onset around December 2024 when he experienced an upper respiratory tract infection with the subsequent development of a possible lower respiratory tract infection. The cough persisted and he was seen by his primary care physician multiple times.  He was prescribed a course of doxycycline , followed by 2 courses of azithromycin /prednisone  with improvement but not full resolution of the symptoms. He had improved during our initial visit but continued to report a lingering cough (75% improvement). There were no other reported symptoms. Patient had a chest x-ray in February 2025 that was reported for a  multifocal infection in the right lung, repeated in April and showing resolution.   Today, he presents for follow up after swallowing study, chest CT, and PFT's. His cough has further improved, but not fully resolved. He feels there is some residual cough, but now down to 5 or 6 brief coughs a day. He has no shortness of breath, and no limitation to his activity. He is otherwise in his usual state of health. He is using claritin  and allegra for his seasonal allergies.   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  He then received adjuvant chemotherapy following the resection and was told that evaluation of the lymph nodes was negative.  He was told in the past that he had allergies and was prescribed cetirizine as well as montelukast .  He was also told at some point that he had reflux disease with associated cough for which she was prescribed gabapentin .  Return Visit 07/20/2024:  He is here for follow-up of his most recent high-resolution chest CT. He previously had a cough. No new respiratory symptoms have developed since his last visit. He had pneumonia last year, which was treated successfully. He has received all recommended vaccines, including Prevnar 20, RSV, flu, and the latest COVID vaccine.  His cough has significantly improved, now occurring infrequently, about once every four to five days, and he attributes occasional coughing to allergies and post-nasal drip. He is currently taking montelukast  and cetirizine, and uses fluticasone  nasal spray as needed.  Friends have observed him pausing in breathing and snoring during sleep, raising concerns about possible sleep apnea. His son, aged 34, also has sleep apnea.    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  He denies any history of smoking or any inhalational exposures.  He previously had dogs but currently does not.  He has not had any pet birds.  He has no  personal or family history of asthma.    Ancillary information including prior medications, full medical/surgical/family/social histories, and PFTs (when available) are listed below and have been reviewed.    Review of Systems  Constitutional:  Negative for chills, fever and weight loss.  Respiratory:  Negative for cough, hemoptysis, sputum production, shortness of breath and wheezing.   Cardiovascular:  Negative for chest pain.     Objective:   Vitals:   07/20/24 1440  BP: 102/70  Pulse: (!) 56  Temp: 98 F (36.7 C)  TempSrc: Temporal  SpO2: 99%  Weight: 193 lb (87.5 kg)  Height: 6' 1 (1.854 m)   99% on RA BMI Readings from Last 3 Encounters:  07/20/24 25.46 kg/m  06/28/24 25.79 kg/m  02/23/24 25.07 kg/m   Wt Readings from Last 3 Encounters:  07/20/24 193 lb (87.5 kg)  06/28/24 195 lb 8 oz (88.7 kg)  02/23/24 190 lb (86.2 kg)     Physical Exam Constitutional:      Appearance: Normal appearance.  Cardiovascular:     Rate and Rhythm: Normal rate and regular rhythm.     Pulses: Normal pulses.     Heart sounds: Normal heart sounds.  Pulmonary:     Effort: Pulmonary effort is normal.     Breath sounds: Normal breath sounds. No wheezing or rales.  Neurological:     General: No focal deficit present.     Mental Status: He is alert and oriented to person, place, and time. Mental status is at baseline.       Ancillary Information    Past Medical History:  Diagnosis Date   Actinic keratosis    Allergy 1960   Phenothiazine Tranquilizers extrapyramidal effects: musc. 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   right upper arm, excised 10/31/21     Family History  Problem Relation Age of Onset   Cancer Mother    Alzheimer's disease Mother    Intellectual disability Mother    Hypertension Father    Alzheimer's disease Father    Intellectual disability Father    Stroke Maternal Grandmother    Alcohol abuse Maternal Grandfather    Hypertension Paternal Grandmother    Stroke Paternal Grandmother    Colon cancer Neg Hx    Prostate cancer Neg Hx      Past Surgical History:  Procedure Laterality Date  CATARACT EXTRACTION W/PHACO Left 04/01/2022   Procedure: CATARACT EXTRACTION PHACO AND INTRAOCULAR LENS PLACEMENT (IOC) LEFT VIVITY TORIC;  Surgeon: Jaye Fallow, MD;  Location: [facility];  Service: Ophthalmology;  Laterality: Left;  7.53 00:41.3   CATARACT EXTRACTION W/PHACO Right 04/15/2022   Procedure: CATARACT EXTRACTION PHACO AND INTRAOCULAR LENS PLACEMENT (IOC) RIGHT VIVITY TORIC;  Surgeon: Jaye Fallow, MD;  Location: [facility];  Service: Ophthalmology;  Laterality: Right;  5.35 00:36.3   CHOLECYSTECTOMY     COLONOSCOPY     COLONOSCOPY WITH PROPOFOL  N/A 11/30/2019   Procedure: COLONOSCOPY WITH PROPOFOL ;  Surgeon: Unk Corinn Skiff, MD;  Location: [facility];  Service: Gastroenterology;  Laterality: N/A;   EYE SURGERY     vitreous detachment---laser   HERNIA REPAIR  2006   Bilateral inguinal herniorrhaphies   KNEE CARTILAGE SURGERY Right 1968   KNEE SURGERY     bilateral meniscal surgeries   LUNG LOBECTOMY     upper right lung 12/15/2007    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 Yuck!  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  [employer].      Wife died 03-13-24- she had dementia.     Has a daughter in [place]; son and daughter in [place]     6 grandchildren, 3 great grandkids.     Orginially from [facility]; spent some time in [place]; moved here 2019.    Lives at [facility]      Has advanced directives   Daughter Camie is health care POA   Would accept resuscitation attempts   No feeding tube if cognitively unaware   Social Drivers of Health   Financial Resource Strain: Low Risk  (06/24/2024)   Overall Financial Resource Strain (CARDIA)    Difficulty of Paying Living Expenses: Not hard at all  Food Insecurity: No Food Insecurity (06/24/2024)   Hunger Vital Sign    Worried About Running Out of Food in the Last Year: Never true    Ran Out of Food in the Last Year: Never true  Transportation Needs: No Transportation Needs (06/24/2024)   PRAPARE - Administrator, Civil Service (Medical): No    Lack of Transportation (Non-Medical): No  Physical Activity: Sufficiently Active (06/24/2024)   Exercise Vital Sign    Days of Exercise per Week: 5 days    Minutes of Exercise per Session: 30 min  Stress: No Stress Concern Present (06/24/2024)   [employer] of Occupational Health - Occupational Stress Questionnaire  Feeling of Stress: Not at all  Social Connections: Moderately Integrated (06/24/2024)   Social Connection and Isolation Panel    Frequency  of Communication with Friends and Family: More than three times a week    Frequency of Social Gatherings with Friends and Family: More than three times a week    Attends Religious Services: More than 4 times per year    Active Member of [employer] or Organizations: Yes    Attends Banker Meetings: More than 4 times per year    Marital Status: Widowed  Intimate Partner Violence: Not At Risk (02/23/2024)   Humiliation, Afraid, Rape, and Kick questionnaire    Fear of Current or Ex-Partner: No    Emotionally Abused: No    Physically Abused: No    Sexually Abused: No     Allergies  Allergen Reactions   Compazine [Prochlorperazine Edisylate]     muscle spasms    Okra Hives   Other     Okra - hives   Phenylbutazone Other (See Comments)   Augmentin  [Amoxicillin -Pot Clavulanate] Diarrhea    Tolerates Amoxicillin  well     CBC    Component Value Date/Time   WBC 5.6 02/23/2024 1306   RBC 4.31 02/23/2024 1306   HGB 13.1 02/23/2024 1306   HCT 39.2 02/23/2024 1306   PLT 167.0 02/23/2024 1306   MCV 90.9 02/23/2024 1306   MCH 30.5 10/18/2021 0901   MCHC 33.5 02/23/2024 1306   RDW 13.7 02/23/2024 1306   LYMPHSABS 1.5 10/18/2021 0901   MONOABS 0.9 10/18/2021 0901   EOSABS 0.2 10/18/2021 0901   BASOSABS 0.0 10/18/2021 0901    Pulmonary Functions Testing Results:    Latest Ref Rng & Units 02/11/2024   12:51 PM  PFT Results  FVC-Pre L 3.81   FVC-Predicted Pre % 84   FVC-Post L 4.01   FVC-Predicted Post % 89   Pre FEV1/FVC % % 68   Post FEV1/FCV % % 71   FEV1-Pre L 2.58   FEV1-Predicted Pre % 80   FEV1-Post L 2.83   DLCO uncorrected ml/min/mmHg 25.44   DLCO UNC% % 96   DLVA Predicted % 110   TLC L 6.53   TLC % Predicted % 85   RV % Predicted % 102     Outpatient Medications Prior to Visit  Medication Sig Dispense Refill   albuterol  (VENTOLIN  HFA) 108 (90 Base) MCG/ACT inhaler Inhale 1-2 puffs into the lungs every 6 (six) hours as needed. 8 g 0   aspirin  EC 81 MG  tablet Take 1 tablet (81 mg total) by mouth daily. Swallow whole.     cetirizine (ZYRTEC) 10 MG tablet Take 10 mg by mouth daily.     clotrimazole -betamethasone  (LOTRISONE ) cream Apply 1 Application topically 2 (two) times daily. 30 g 0   diclofenac  Sodium (VOLTAREN ) 1 % GEL APPLY FOUR GRAMS TO BOTH KNEES FOUR TIMES DAILY AS NEEDED 600 g 4   famotidine  (PEPCID ) 20 MG tablet TAKE ONE TABLET BY MOUTH EVERY NIGHT AT BEDTIME 90 tablet 0   fexofenadine (ALLEGRA) 180 MG tablet Take 180 mg by mouth daily.     fluticasone  (FLONASE ) 50 MCG/ACT nasal spray Place 1 spray into both nostrils 2 (two) times daily.     gabapentin  (NEURONTIN ) 300 MG capsule TAKE ONE CAPSULE BY MOUTH TWICE DAILY 180 capsule 3   ketoconazole (NIZORAL) 2 % cream APPLY A SMALL AMOUNT TO AFFECTED AREA TWICE DAILY 60 g 3   levothyroxine  (SYNTHROID ) 50 MCG tablet  Take 1 tablet (50 mcg total) by mouth daily before breakfast. 30 tablet 0   Liniments (DEEP BLUE RELIEF) GEL Apply topically.     meloxicam  (MOBIC ) 15 MG tablet Take 1 tablet (15 mg total) by mouth daily as needed. 90 tablet 1   montelukast  (SINGULAIR ) 10 MG tablet TAKE ONE TABLET (10 MG TOTAL) BY MOUTH AT BEDTIME. 90 tablet 0   Multiple Vitamin (MULTIVITAMIN WITH MINERALS) TABS tablet Take 1 tablet by mouth daily.     mupirocin  ointment (BACTROBAN ) 2 % Apply twice daily as needed 22 g 1   Probiotic Product (PROBIOTIC PO) Take 1 capsule by mouth 2 (two) times daily.     rosuvastatin  (CRESTOR ) 20 MG tablet Take 1 tablet (20 mg total) by mouth daily. 90 tablet 3   sildenafil  (VIAGRA ) 100 MG tablet TAKE ONE TABLET BY MOUTH ONCE A DAY AS NEEDED FOR ERECTILE DYSFUNCTION. 10 tablet 11   triamcinolone  cream (KENALOG ) 0.1 % APPLY TO HANDS AT BEDTIME AND COVER WITH GLOVES AS NEEDED FOR FLARES. AVOID FACE, GROIN, AXILLA. 80 g 0   No facility-administered medications prior to visit.

## 2024-07-21 LAB — LIPID PANEL
Chol/HDL Ratio: 2.3 ratio (ref 0.0–5.0)
Cholesterol, Total: 106 mg/dL (ref 100–199)
HDL: 47 mg/dL (ref >39)
LDL Chol Calc (NIH): 44 mg/dL (ref 0–99)
Triglycerides: 69 mg/dL (ref 0–149)
VLDL Cholesterol Cal: 15 mg/dL (ref 5–40)

## 2024-07-21 LAB — ALT: ALT: 19 IU/L (ref 0–44)

## 2024-07-22 ENCOUNTER — Encounter: Payer: Self-pay | Admitting: Cardiology

## 2024-07-22 ENCOUNTER — Ambulatory Visit: Payer: Self-pay | Admitting: Internal Medicine

## 2024-07-22 ENCOUNTER — Ambulatory Visit: Attending: Cardiology | Admitting: Cardiology

## 2024-07-22 VITALS — BP 98/62 | HR 66 | Ht 73.0 in | Wt 193.0 lb

## 2024-07-22 DIAGNOSIS — I700 Atherosclerosis of aorta: Secondary | ICD-10-CM | POA: Insufficient documentation

## 2024-07-22 DIAGNOSIS — I2510 Atherosclerotic heart disease of native coronary artery without angina pectoris: Secondary | ICD-10-CM | POA: Diagnosis not present

## 2024-07-22 DIAGNOSIS — E7849 Other hyperlipidemia: Secondary | ICD-10-CM | POA: Insufficient documentation

## 2024-07-22 NOTE — Progress Notes (Signed)
 Cardiology Office Note   Date:  07/22/2024  ID:  Kevin Terrell, DOB 08-23-1941, MRN [id number] PCP: Cleatus Arlyss RAMAN, MD  [facility] Providers Cardiologist:  Lonni Hanson, MD     History of Present Illness Kevin Terrell is a 83 y.o. male with a past medical history of aortic atherosclerosis and coronary artery calcification, hyperlipidemia, lung cancer status post right upper lobe lobectomy status post chemotherapy, hypothyroidism, GERD, arthritis, and peripheral neuropathy, who presents today for follow-up.   Previously was evaluated by Dr. Hanson 03/2021 after hospitalization which required cholecystectomy in the treatment of pancreatitis.  He continues to follow with oncology with routine low-dose CT of the chest.  He underwent carotid duplex in 2021 that showed minimal atherosclerotic plaquing without stenosis of the right carotid artery.  Normal left carotid artery.  Calcium  score completed 09/05/2022 revealed a calcium  score of 1031 which was a 71st percentile for age and sex matched controls.  He then underwent an exercise MPI which reveals revealed a low risk study without evidence of ischemia or scar.   He was last seen in clinic 06/15/2023 by Dr. Hanson.  At that time he been feeling quite well.  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  But he was without falls.  He denied any angina or anginal equivalents.  He was tolerating low-dose rosuvastatin  well at that time.  Most recent lipid panel had revealed an LDL of 78 and they had agreed to escalate rosuvastatin  to 10 mg daily with repeat labs in 3 months.  He had called in 03/21/2024 with his LDL did little change since his last check and continued to remain above the goal of less than 70.  Since he continued to tolerate rosuvastatin  10 mg daily it was increased to 20 mg daily and repeating his lipid and ALT in 3 months.  He returns to clinic today accompanied by a friend.   Stating overall he has been doing well.  Denied any chest pain, shortness of breath, palpitations that has been exertion.  Continues to suffer from chronic neuropathy that is unchanged.  He is extremely excited today about his LDL that is below the goal of 70 on his last check.  He has been tolerating rosuvastatin  without any undue side effects.  He has been compliant medication regimen.  Denies any hospitalizations or visits to the emergency department.  ROS: 10 point review of systems has been reviewed and considered negative the exception was been listed in the HPI  Studies Reviewed EKG Interpretation Date/Time:  Friday July 22 2024 11:21:06 EST Ventricular Rate:  66 PR Interval:  204 QRS Duration:  94 QT Interval:  400 QTC Calculation: 419 R Axis:   -7  Text Interpretation: Sinus rhythm with Premature atrial complexes When compared with ECG of 15-Jun-2023 13:22, No significant change was found Confirmed by Gerard Frederick ([id number]) on 07/22/2024 11:25:35 AM    Exercise MPI (09/15/2022): Low risk study without evidence of ischemia or scar.  LVEF > 65%. Coronary calcium  score (09/05/2022): Coronary calcium  score 1031 (71st percentile for age and sex matched controls). Carotid Dopplers (12/06/2019): Minimal atherosclerotic plaquing without stenosis involving the right carotid artery.  Normal left carotid artery.  Risk Assessment/Calculations           Physical Exam VS:  BP 98/62 (BP Location: Left Arm, Patient Position: Sitting, Cuff Size: Normal)   Pulse 66   Ht 6' 1 (1.854 m)   Wt 193 lb (87.5  kg)   SpO2 97%   BMI 25.46 kg/m        Wt Readings from Last 3 Encounters:  07/22/24 193 lb (87.5 kg)  07/20/24 193 lb (87.5 kg)  06/28/24 195 lb 8 oz (88.7 kg)    GEN: Well nourished, well developed in no acute distress NECK: No JVD; No carotid bruits CARDIAC: RRR, no murmurs, rubs, gallops RESPIRATORY:  Clear to auscultation without rales, wheezing or rhonchi  ABDOMEN: Soft,  non-tender, non-distended EXTREMITIES:  No edema; No deformity   ASSESSMENT AND PLAN Coronary artery disease and aortic atherosclerosis where he continues to do well without symptoms of angina or anginal equivalents.  Coronary calcium  score was noted previously and he underwent exercise MPI demonstrated absence of perfusion defects consistent with nonobstructive coronary artery disease.  Most recent LDL was 44 on his last lab abs.  He has been continued on aspirin  81 mg daily and rosuvastatin  20 mg daily.  EKG today reveals sinus rhythm with PACs with a rate of 66 with no acute ischemic changes noted.  No further ischemic evaluation is needed at this time.  Hyperlipidemia with an LDL of 44 which has remained at goal of less than 70.  He has continued on rosuvastatin  20 mg daily.  He has been encouraged to continue with his dietary modification and increasing his activity.       Dispo: Patient to return to clinic see MD/APP in 11 to 12 months or sooner if needed for reevaluation  Signed, Johnryan Sao, NP

## 2024-07-22 NOTE — Patient Instructions (Signed)
 Medication Instructions:  Your physician recommends that you continue on your current medications as directed. Please refer to the Current Medication list given to you today.   *If you need a refill on your cardiac medications before your next appointment, please call your pharmacy*  Lab Work: No labs ordered today  If you have labs (blood work) drawn today and your tests are completely normal, you will receive your results only by: MyChart Message (if you have MyChart) OR A paper copy in the mail If you have any lab test that is abnormal or we need to change your treatment, we will call you to review the results.  Testing/Procedures: No test ordered today   Follow-Up: At [facility], you and your health needs are our priority.  As part of our continuing mission to provide you with exceptional heart care, our providers are all part of one team.  This team includes your primary Cardiologist (physician) and Advanced Practice Providers or APPs (Physician Assistants and Nurse Practitioners) who all work together to provide you with the care you need, when you need it.  Your next appointment:   11 month(s)  Provider:   Lonni Hanson, MD

## 2024-08-04 ENCOUNTER — Ambulatory Visit: Payer: Self-pay

## 2024-08-04 NOTE — Telephone Encounter (Signed)
 I spoke with the patient He said he has a history of pneumonia and he wants to catch it early if that is what he has now.. Cough with green sputum for 10 days. No fevers, chills or sweats. No increased SOB. Little bit of wheezing when he coughs. Patient takes Cetirizine and Montelukast  every morning. He wants to know if he should he add Allegra. Also, should he take Mucinex? Should he be using Albuterol ?  I told the patient Dr. Isadora is out of the office until tomorrow. He is okay waiting until he returns to get a response. He will go to the UC or ED if his symptoms worsen.   [facility]

## 2024-08-04 NOTE — Telephone Encounter (Signed)
     FYI Only or Action Required?: Action required by provider: request for appointment and clinical question for provider.  Patient is followed in Pulmonology for 07/20/2024, last seen on 07/20/2024 by Isadora Hose, MD.  Called Nurse Triage reporting Cough.  Symptoms began x 10 days.  Interventions attempted: Nothing.  Symptoms are: gradually worsening.  Triage Disposition: See PCP When Office is Open (Within 3 Days)  Patient/caregiver understands and will follow disposition?: YesCopied from CRM #[id number]. Topic: Clinical - Red Word Triage >> Aug 04, 2024 12:09 PM Joesph PARAS wrote: Red Word that prompted transfer to Nurse Triage: Worsening returning cough with green chunky phlegm, states no idea if has or not. Reason for Disposition  Cough has been present for > 3 weeks  Answer Assessment - Initial Assessment Questions 1. ONSET: When did the cough begin?      X 10 days 2. SEVERITY: How bad is the cough today?      moderate 3. SPUTUM: Describe the color of your sputum (e.g., none, dry cough; clear, white, yellow, green)     Green chunky 4. HEMOPTYSIS: Are you coughing up any blood? If Yes, ask: How much? (e.g., flecks, streaks, tablespoons, etc.)     no 5. DIFFICULTY BREATHING: Are you having difficulty breathing? If Yes, ask: How bad is it? (e.g., mild, moderate, severe)      no 6. FEVER: Do you have a fever? If Yes, ask: What is your temperature, how was it measured, and when did it start?     no 7. CARDIAC HISTORY: Do you have any history of heart disease? (e.g., heart attack, congestive heart failure)      na 8. LUNG HISTORY: Do you have any history of lung disease?  (e.g., pulmonary embolus, asthma, emphysema)     yes 9. PE RISK FACTORS: Do you have a history of blood clots? (or: recent major surgery, recent prolonged travel, bedridden)     na 10. OTHER SYMPTOMS: Do you have any other symptoms? (e.g., runny nose, wheezing, chest pain)        wheezing 11. PREGNANCY: Is there any chance you are pregnant? When was your last menstrual period?       na 12. TRAVEL: Have you traveled out of the country in the last month? (e.g., travel history, exposures)       Na  Cough for 6 months and it had gotten better - last seen on 07/20/2024 cough was almost gone: more like a clearing of throat- now cough has gotten worse within x 10 days and now coughing up green: pt would like to schedule appt with pulm provider but no appt available.  Please update pulm provider on pt status and reach back out to the pt with updated plan of care.  Protocols used: Cough - Acute Productive-A-AH

## 2024-08-05 ENCOUNTER — Other Ambulatory Visit: Payer: Self-pay

## 2024-08-05 DIAGNOSIS — G8929 Other chronic pain: Secondary | ICD-10-CM | POA: Diagnosis not present

## 2024-08-05 DIAGNOSIS — U071 COVID-19: Secondary | ICD-10-CM

## 2024-08-05 DIAGNOSIS — M170 Bilateral primary osteoarthritis of knee: Secondary | ICD-10-CM | POA: Diagnosis not present

## 2024-08-05 MED ORDER — AZITHROMYCIN 250 MG PO TABS: ORAL_TABLET | ORAL | 0 refills | Status: DC

## 2024-08-05 MED ORDER — ALBUTEROL SULFATE HFA 108 (90 BASE) MCG/ACT IN AERS: 1.0000 | INHALATION_SPRAY | Freq: Four times a day (QID) | RESPIRATORY_TRACT | 11 refills | Status: AC | PRN

## 2024-08-05 NOTE — Telephone Encounter (Signed)
 I have notified the patient and sent in the Z-pak and a refill on his Albuterol .  Nothing further needed.

## 2024-08-11 ENCOUNTER — Ambulatory Visit: Admitting: Internal Medicine

## 2024-08-11 ENCOUNTER — Ambulatory Visit: Payer: Self-pay

## 2024-08-11 ENCOUNTER — Other Ambulatory Visit: Payer: Self-pay | Admitting: Family Medicine

## 2024-08-11 ENCOUNTER — Ambulatory Visit: Payer: Self-pay | Admitting: Student in an Organized Health Care Education/Training Program

## 2024-08-11 NOTE — Telephone Encounter (Signed)
 Noted and will evaluate patient tomorrow. If he is having shortness of breath; suggest UC today.

## 2024-08-11 NOTE — Telephone Encounter (Signed)
 Spoke with pt to notify that we cannot test for strep or flu within clinic but we can test for Covid, however Dr. Isaiah has advised he visit an urgent care to rule out strep/flu/covid and we could see him afterwards. Pt was hesitant to go to urgent care but understood and will follow up after testing.

## 2024-08-11 NOTE — Telephone Encounter (Signed)
 Called patient and advised UC today if SOB gets worse.

## 2024-08-11 NOTE — Telephone Encounter (Signed)
 [facility] Pulmonary Triage - Initial Assessment Questions Chief Complaint (e.g., cough, sob, wheezing, fever, chills, sweat or additional symptoms) *Go to specific symptom protocol after initial questions. Cough-painful, sore throat  How long have symptoms been present? Recent Zpack- cough painful throughout, sore throat today  Have you tested for COVID or Flu? Note: If not, ask patient if a home test can be taken. If so, instruct patient to call back for positive results. No  MEDICINES:   Have you used any OTC meds to help with symptoms? Yes If yes, ask What medications? guaifenesin  Have you used your inhalers/maintenance medication? Yes If yes, What medications? Albuterol   If inhaler, ask How many puffs and how often? Note: Review instructions on medication in the chart. Every 6 hours  OXYGEN: Do you wear supplemental oxygen? No If yes, How many liters are you supposed to use? na  Do you monitor your oxygen levels? No If yes, What is your reading (oxygen level) today? na  What is your usual oxygen saturation reading?  (Note: Pulmonary O2 sats should be 90% or greater) na   Copied from CRM #[id number]. Topic: Clinical - Red Word Triage >> Aug 11, 2024  7:58 AM Joesph PARAS wrote: Red Word that prompted transfer to Nurse Triage: Had a zpack for a URI and was progressing but woke up this morning with a violent sore throat. States also coughing. Took temperature on phone and Is reading 97.5, states he runs low normally. Reason for Disposition  [1] Continuous (nonstop) coughing interferes with work or school AND [2] no improvement using cough treatment per Care Advice  Answer Assessment - Initial Assessment Questions 1. ONSET: When did the cough begin?      Chronic cough- changed 12/4 and was started on antibiotic-Z-pack 5 days- patient did improve but not completely 2. SEVERITY: How bad is the cough today?      Painful cough in throat 3. SPUTUM: Describe  the color of your sputum (e.g., none, dry cough; clear, white, yellow, green)     Light green 4. HEMOPTYSIS: Are you coughing up any blood? If Yes, ask: How much? (e.g., flecks, streaks, tablespoons, etc.)     no 5. DIFFICULTY BREATHING: Are you having difficulty breathing? If Yes, ask: How bad is it? (e.g., mild, moderate, severe)      No- does have to work to get air during coughing fit 6. FEVER: Do you have a fever? If Yes, ask: What is your temperature, how was it measured, and when did it start?     No- 97.6 7. CARDIAC HISTORY: Do you have any history of heart disease? (e.g., heart attack, congestive heart failure)      Coronary artery disease 8. LUNG HISTORY: Do you have any history of lung disease?  (e.g., pulmonary embolus, asthma, emphysema)     Hx lung cancer  10. OTHER SYMPTOMS: Do you have any other symptoms? (e.g., runny nose, wheezing, chest pain)       Sore throat, fatigue  Protocols used: Cough - Acute Productive-A-AH

## 2024-08-11 NOTE — Telephone Encounter (Signed)
 FYI Only or Action Required?: FYI only for provider: appointment scheduled on 12/11.  Patient is followed in Pulmonology for hx lung cancer, last seen on 07/20/2024 by Isadora Hose, MD.  Called Nurse Triage reporting Cough.  Symptoms began several days ago.  Interventions attempted: Prescription medications: Zpack 12/4 .  Symptoms are: gradually worsening.  Triage Disposition: See Physician Within 24 Hours  Patient/caregiver understands and will follow disposition?: Yes

## 2024-08-11 NOTE — Telephone Encounter (Signed)
 FYI Only or Action Required?: FYI only for provider: appointment scheduled on 08/12/2024 at 8:20 AM.  Patient was last seen in primary care on 06/28/2024 by Cleatus Arlyss RAMAN, MD.  Called Nurse Triage reporting Sore Throat.  Symptoms began today.  Interventions attempted: Rest, hydration, or home remedies.  Symptoms are: unchanged.  Triage Disposition: See Physician Within 24 Hours  Patient/caregiver understands and will follow disposition?: Yes  Copied from CRM #[id number]. Topic: Clinical - Red Word Triage >> Aug 11, 2024 10:12 AM Jayma L wrote: Red Word that prompted transfer to Nurse Triage: finished a zpack 1 week ago , now has a sore throat that started this morning, runny nose and eyes, a lot of pain in throat and shortness of breath from coughing so much as well as chest pain Reason for Disposition  SEVERE throat pain (e.g., excruciating)  Answer Assessment - Initial Assessment Questions Patient reports throat pain that increased today. Patient requesting an earlier appointment tomorrow due to a standing ortho appointment he has. Rescheduled patient for 8:20 AM tomorrow.   1. ONSET: When did the throat start hurting? (Hours or days ago)      Throat pain increased today when he woke up 2. SEVERITY: How bad is the sore throat? (Scale 1-10; mild, moderate or severe)     8 to 9 when coughing 3. STREP EXPOSURE: Has there been any exposure to strep within the past week? If Yes, ask: What type of contact occurred?      Not that he is aware of 4.  VIRAL SYMPTOMS: Are there any symptoms of a cold, such as a runny nose, cough, hoarse voice or red eyes?      Cough, runny nose 5. FEVER: Do you have a fever? If Yes, ask: What is your temperature, how was it measured, and when did it start?     no 6. PUS ON THE TONSILS: Is there pus on the tonsils in the back of your throat?     Hasn't looked 7. OTHER SYMPTOMS: Do you have any other symptoms? (e.g., difficulty breathing,  headache, rash)     Some shortness of breath, headache  Protocols used: Sore Throat-A-AH

## 2024-08-11 NOTE — Telephone Encounter (Signed)
 Noted. Thanks.

## 2024-08-12 ENCOUNTER — Ambulatory Visit

## 2024-08-12 ENCOUNTER — Ambulatory Visit (INDEPENDENT_AMBULATORY_CARE_PROVIDER_SITE_OTHER)
Admission: RE | Admit: 2024-08-12 | Discharge: 2024-08-12 | Disposition: A | Source: Ambulatory Visit | Attending: General Practice | Admitting: General Practice

## 2024-08-12 ENCOUNTER — Ambulatory Visit: Payer: Self-pay | Admitting: General Practice

## 2024-08-12 ENCOUNTER — Ambulatory Visit: Admitting: Family Medicine

## 2024-08-12 ENCOUNTER — Encounter: Payer: Self-pay | Admitting: General Practice

## 2024-08-12 ENCOUNTER — Ambulatory Visit: Admitting: General Practice

## 2024-08-12 VITALS — BP 122/62 | HR 88 | Temp 99.7°F | Ht 73.0 in | Wt 198.0 lb

## 2024-08-12 DIAGNOSIS — C3490 Malignant neoplasm of unspecified part of unspecified bronchus or lung: Secondary | ICD-10-CM | POA: Diagnosis not present

## 2024-08-12 DIAGNOSIS — J029 Acute pharyngitis, unspecified: Secondary | ICD-10-CM

## 2024-08-12 DIAGNOSIS — R059 Cough, unspecified: Secondary | ICD-10-CM | POA: Diagnosis not present

## 2024-08-12 DIAGNOSIS — J020 Streptococcal pharyngitis: Secondary | ICD-10-CM | POA: Diagnosis not present

## 2024-08-12 DIAGNOSIS — M25561 Pain in right knee: Secondary | ICD-10-CM | POA: Diagnosis not present

## 2024-08-12 DIAGNOSIS — R062 Wheezing: Secondary | ICD-10-CM | POA: Diagnosis not present

## 2024-08-12 DIAGNOSIS — M1711 Unilateral primary osteoarthritis, right knee: Secondary | ICD-10-CM | POA: Diagnosis not present

## 2024-08-12 DIAGNOSIS — M25562 Pain in left knee: Secondary | ICD-10-CM | POA: Diagnosis not present

## 2024-08-12 DIAGNOSIS — M1712 Unilateral primary osteoarthritis, left knee: Secondary | ICD-10-CM | POA: Diagnosis not present

## 2024-08-12 DIAGNOSIS — R051 Acute cough: Secondary | ICD-10-CM | POA: Diagnosis not present

## 2024-08-12 DIAGNOSIS — G8929 Other chronic pain: Secondary | ICD-10-CM | POA: Diagnosis not present

## 2024-08-12 LAB — CBC WITH DIFFERENTIAL/PLATELET
Basophils Absolute: 0 K/uL (ref 0.0–0.1)
Basophils Relative: 0.3 % (ref 0.0–3.0)
Eosinophils Absolute: 0.4 K/uL (ref 0.0–0.7)
Eosinophils Relative: 4.1 % (ref 0.0–5.0)
HCT: 38.4 % — ABNORMAL LOW (ref 39.0–52.0)
Hemoglobin: 12.8 g/dL — ABNORMAL LOW (ref 13.0–17.0)
Lymphocytes Relative: 10.5 % — ABNORMAL LOW (ref 12.0–46.0)
Lymphs Abs: 0.9 K/uL (ref 0.7–4.0)
MCHC: 33.4 g/dL (ref 30.0–36.0)
MCV: 91.1 fl (ref 78.0–100.0)
Monocytes Absolute: 1.5 K/uL — ABNORMAL HIGH (ref 0.1–1.0)
Monocytes Relative: 16.8 % — ABNORMAL HIGH (ref 3.0–12.0)
Neutro Abs: 6 K/uL (ref 1.4–7.7)
Neutrophils Relative %: 68.3 % (ref 43.0–77.0)
Platelets: 169 K/uL (ref 150.0–400.0)
RBC: 4.22 Mil/uL (ref 4.22–5.81)
RDW: 13 % (ref 11.5–15.5)
WBC: 8.7 K/uL (ref 4.0–10.5)

## 2024-08-12 LAB — COMPREHENSIVE METABOLIC PANEL WITH GFR
ALT: 15 U/L (ref 0–53)
AST: 18 U/L (ref 0–37)
Albumin: 4.2 g/dL (ref 3.5–5.2)
Alkaline Phosphatase: 73 U/L (ref 39–117)
BUN: 15 mg/dL (ref 6–23)
CO2: 35 meq/L — ABNORMAL HIGH (ref 19–32)
Calcium: 9.5 mg/dL (ref 8.4–10.5)
Chloride: 98 meq/L (ref 96–112)
Creatinine, Ser: 0.87 mg/dL (ref 0.40–1.50)
GFR: 80.55 mL/min (ref >60.00)
Glucose, Bld: 87 mg/dL (ref 70–99)
Potassium: 4.3 meq/L (ref 3.5–5.1)
Sodium: 140 meq/L (ref 135–145)
Total Bilirubin: 0.4 mg/dL (ref 0.2–1.2)
Total Protein: 7.4 g/dL (ref 6.0–8.3)

## 2024-08-12 LAB — POCT INFLUENZA A/B
Influenza A, POC: NEGATIVE
Influenza B, POC: NEGATIVE

## 2024-08-12 LAB — POCT RAPID STREP A (OFFICE): Rapid Strep A Screen: POSITIVE — AB

## 2024-08-12 LAB — POC COVID19 BINAXNOW: SARS Coronavirus 2 Ag: NEGATIVE

## 2024-08-12 MED ORDER — BENZONATATE 200 MG PO CAPS: 200.0000 mg | ORAL_CAPSULE | Freq: Three times a day (TID) | ORAL | 0 refills | Status: DC | PRN

## 2024-08-12 MED ORDER — AMOXICILLIN 500 MG PO CAPS: 500.0000 mg | ORAL_CAPSULE | Freq: Two times a day (BID) | ORAL | 0 refills | Status: AC

## 2024-08-12 MED ORDER — HYDROCODONE BIT-HOMATROP MBR 5-1.5 MG/5ML PO SOLN: 5.0000 mL | Freq: Three times a day (TID) | ORAL | 0 refills | Status: DC | PRN

## 2024-08-12 MED ORDER — PREDNISONE 20 MG PO TABS: 40.0000 mg | ORAL_TABLET | Freq: Every day | ORAL | 0 refills | Status: AC

## 2024-08-12 NOTE — Progress Notes (Signed)
 Established Patient Office Visit  Subjective   Patient ID: Kevin Terrell, male    DOB: 12-Sep-1940  Age: 83 y.o. MRN: [id number]  Chief Complaint  Patient presents with   Sore Throat    With severe cough, some wheezing; was on a zpac around the 3rd or 4th; states it helped some but sx has come back. Has hx of pneumonia and lung cancer. Has been taking tylenol  and otc cold medicine since taking abx.     Sore Throat  Associated symptoms include congestion, coughing, headaches and shortness of breath. Pertinent negatives include no abdominal pain, diarrhea, ear pain or vomiting.    Discussed the use of AI scribe software for clinical note transcription with the patient, who gave verbal consent to proceed.  History of Present Illness Kevin Terrell is an 83 year old male with a history of pneumonia and lung cancer who presents with respiratory symptoms and a positive strep test.  He has a history of pneumonia that lasted three to six months last year, which was difficult to resolve. He began experiencing symptoms on December 3rd or 4th, including cough, wheezing, and congestion. He did not have a fever until today, which he notes is high for him at 99.69F, as he typically runs a lower temperature around 97.9F.  He describes significant fatigue and nasal congestion with dark green, chunky nasal discharge. He experiences a sore throat and chest pain associated with coughing, which causes difficulty in breathing and filling his lungs. His cough is productive, with sputum changing from dark green to light green. He feels short of breath and wheezes when coughing, and has difficulty with air exchange.  No ear pain, abdominal symptoms such as diarrhea, nausea, or vomiting, and urinary symptoms. He reports softer stools but not diarrhea. He experiences headaches, particularly when coughing, which he feels are located in the frontal area near his eyebrows.  He has been using  over-the-counter medications including dexamethasone  cough medicine, Mucinex 1200 mg BID, and has considered using Flonase . He has previously used Tessalon  Perles with limited success and has been cautious with hydrocodone  cough syrup, using it no more than twice a day. 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   right upper arm, excised 10/31/21   Past Surgical  History:  Procedure Laterality Date   CATARACT EXTRACTION W/PHACO Left 04/01/2022   Procedure: CATARACT EXTRACTION PHACO AND INTRAOCULAR LENS PLACEMENT (IOC) LEFT VIVITY TORIC;  Surgeon: Jaye Fallow, MD;  Location: [facility];  Service: Ophthalmology;  Laterality: Left;  7.53 00:41.3   CATARACT EXTRACTION W/PHACO Right 04/15/2022   Procedure: CATARACT EXTRACTION PHACO AND INTRAOCULAR LENS PLACEMENT (IOC) RIGHT VIVITY TORIC;  Surgeon: Jaye Fallow, MD;  Location: [facility];  Service: Ophthalmology;  Laterality: Right;  5.35 00:36.3   CHOLECYSTECTOMY     COLONOSCOPY     COLONOSCOPY WITH PROPOFOL  N/A 11/30/2019   Procedure: COLONOSCOPY WITH PROPOFOL ;  Surgeon: Unk Corinn Skiff, MD;  Location: [facility];  Service: Gastroenterology;  Laterality: N/A;   EYE SURGERY     vitreous detachment---laser   HERNIA REPAIR  2006   Bilateral inguinal herniorrhaphies   KNEE CARTILAGE SURGERY Right 1968   KNEE SURGERY     bilateral meniscal surgeries   LUNG LOBECTOMY     upper right lung 12/15/2007    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   UPPER GI ENDOSCOPY     Allergies[1]       08/12/2024    8:28 AM 06/28/2024   12:37 PM 02/23/2024   11:36 AM  Depression screen PHQ 2/9  Decreased Interest 0 0 0  Down, Depressed, Hopeless 0 0 0  PHQ - 2 Score 0 0 0  Altered sleeping 0 0   Tired, decreased energy 0 0   Change in appetite 0 0   Feeling bad or failure about yourself  0 0   Trouble concentrating 0 0   Moving slowly or fidgety/restless 0 0   Suicidal thoughts 0 0   PHQ-9 Score 0 0    Difficult doing work/chores Not difficult at all       Data saved with a previous flowsheet row definition       08/12/2024    8:28 AM 06/28/2024   12:37 PM  GAD 7 : Generalized Anxiety Score  Nervous, Anxious, on Edge 0 0  Control/stop worrying 0 0  Worry too much - different things 0 0  Trouble relaxing 0 0  Restless 0 0  Easily annoyed or irritable 0 0  Afraid -  awful might happen 0   Total GAD 7 Score 0   Anxiety Difficulty Not difficult at all       Review of Systems  Constitutional:  Positive for fever and malaise/fatigue. Negative for chills.  HENT:  Positive for congestion, sinus pain and sore throat. Negative for ear pain.   Respiratory:  Positive for cough, sputum production, shortness of breath and wheezing.   Cardiovascular:  Positive for chest pain.       Chest pain with cough  Gastrointestinal:  Negative for abdominal pain, constipation, diarrhea, heartburn, nausea and vomiting.  Genitourinary:  Negative for dysuria, frequency and urgency.  Neurological:  Positive for headaches. Negative for dizziness.  Endo/Heme/Allergies:  Negative for polydipsia.  Psychiatric/Behavioral:  Negative for depression and suicidal ideas. The patient is not nervous/anxious.       Objective:     BP 122/62   Pulse 88  Temp 99.7 F (37.6 C) (Oral)   Ht 6' 1 (1.854 m)   Wt 198 lb (89.8 kg)   SpO2 96%   BMI 26.12 kg/m  BP Readings from Last 3 Encounters:  08/12/24 122/62  07/22/24 98/62  07/20/24 102/70   Wt Readings from Last 3 Encounters:  08/12/24 198 lb (89.8 kg)  07/22/24 193 lb (87.5 kg)  07/20/24 193 lb (87.5 kg)      Physical Exam Vitals and nursing note reviewed.  Constitutional:      Appearance: Normal appearance.  HENT:     Right Ear: Tympanic membrane, ear canal and external ear normal.     Left Ear: Tympanic membrane, ear canal and external ear normal.     Nose:     Right Sinus: Maxillary sinus tenderness and frontal sinus tenderness present.     Left Sinus: Maxillary sinus tenderness and frontal sinus tenderness present.     Mouth/Throat:     Pharynx: Oropharynx is clear. Posterior oropharyngeal erythema present.  Eyes:     Conjunctiva/sclera: Conjunctivae normal.  Cardiovascular:     Rate and Rhythm: Normal rate and regular rhythm.     Pulses: Normal pulses.     Heart sounds: Normal heart sounds.  Pulmonary:      Effort: Pulmonary effort is normal.     Breath sounds: Normal breath sounds.  Lymphadenopathy:     Head:     Right side of head: Submandibular adenopathy present.     Left side of head: Submandibular adenopathy present.  Neurological:     Mental Status: He is alert and oriented to person, place, and time.  Psychiatric:        Mood and Affect: Mood normal.        Behavior: Behavior normal.        Thought Content: Thought content normal.        Judgment: Judgment normal.      Results for orders placed or performed in visit on 08/12/24  POC COVID-19  Result Value Ref Range   SARS Coronavirus 2 Ag Negative Negative  POCT Influenza A/B  Result Value Ref Range   Influenza A, POC Negative Negative   Influenza B, POC Negative Negative  POCT rapid strep A  Result Value Ref Range   Rapid Strep A Screen Positive (A) Negative       The ASCVD Risk score (Arnett DK, et al., 2019) failed to calculate for the following reasons:   The 2019 ASCVD risk score is only valid for ages 74 to 37   * - Cholesterol units were assumed    Assessment & Plan:  Sore throat -     POC COVID-19 BinaxNow -     POCT Influenza A/B -     POCT rapid strep A -     DG Chest 2 View -     Benzonatate ; Take 1 capsule (200 mg total) by mouth 3 (three) times daily as needed.  Dispense: 20 capsule; Refill: 0  Acute cough -     predniSONE ; Take 2 tablets (40 mg total) by mouth daily for 5 days.  Dispense: 10 tablet; Refill: 0 -     HYDROcodone  Bit-Homatrop MBr; Take 5 mLs by mouth every 8 (eight) hours as needed for cough.  Dispense: 120 mL; Refill: 0  Strep throat -     Amoxicillin ; Take 1 capsule (500 mg total) by mouth 2 (two) times daily for 10 days.  Dispense: 20 capsule; Refill: 0 -  CBC with Differential/Platelet -     Comprehensive metabolic panel with GFR    Assessment and Plan Assessment & Plan Streptococcal pharyngitis Positive strep test with sore throat and redness. No white patches or  swollen tonsils. Symptoms include headaches and nasal congestion. - Start amoxicillin  antibiotics for the infection. Take 1 tablet by mouth twice daily for 10 days.   Acute lower respiratory infection, rule out pneumonia Productive cough with light green sputum, wheezing, and shortness of breath. History of pneumonia and lung cancer. Wheezing and crackles noted. Differential includes pneumonia. - Ordered chest x-ray to rule out pneumonia. - Prescribed Tessalon  Perles for cough. -Start prednisone  20 mg tablets. Take 2 tablets by mouth once daily in the morning for 5 days. -Start Benzonatate  capsules for cough. Take 1 capsule by mouth three times daily as needed for cough.  -Start Hycodan 5 ml 1-2 times as needed; as discussed it can make you sleepy.  - Strict ER precautions discussed.  - Rest, drink plenty of fluids and use humidifier at night.     Return if symptoms worsen or fail to improve.    Carrol Aurora, NP     [1]  Allergies Allergen Reactions   Compazine [Prochlorperazine Edisylate]     muscle spasms    Okra Hives   Other     Okra - hives   Phenylbutazone Other (See Comments)   Augmentin  [Amoxicillin -Pot Clavulanate] Diarrhea    Tolerates Amoxicillin  well

## 2024-08-12 NOTE — Patient Instructions (Addendum)
 Complete xray(s) prior to leaving today. I will notify you of your results once received.  Stop by the lab prior to leaving today. I will notify you of your results once received.   Start prednisone  20 mg tablets. Take 2 tablets by mouth once daily in the morning for 5 days. Start Benzonatate  capsules for cough. Take 1 capsule by mouth three times daily as needed for cough.  Start Hycodan 5 ml 1-2 times as needed; as discussed it can make you sleepy.  Start amoxicillin  antibiotics for the infection. Take 1 tablet by mouth twice daily for 10 days.  You can try a few things over the counter to help with your symptoms including:  Cough: Delsym or Robitussin (get the off brand, works just as well) Chest Congestion: Mucinex (plain) Nasal Congestion/Ear Pressure/Sinus Pressure: Try using Flonase  (fluticasone ) nasal spray. Instill 1 spray in each nostril twice daily. This can be purchased over the counter. Body aches, fevers, headache: Ibuprofen (not to exceed 2400 mg in 24 hours) or Acetaminophen -Tylenol  (not to exceed 3000 mg in 24 hours) Runny Nose/Throat Drainage/Sneezing/Itchy or Watery Eyes: An antihistamine such as Zyrtec, Claritin , Xyzal, Allegra   Rest, drink plenty of fluids and use humidifier at night.   Update me if symptoms worsen or do not improve.   It was a pleasure meeting you!

## 2024-08-16 ENCOUNTER — Ambulatory Visit

## 2024-08-19 ENCOUNTER — Ambulatory Visit

## 2024-08-31 ENCOUNTER — Other Ambulatory Visit: Payer: Self-pay | Admitting: Family Medicine

## 2024-09-05 ENCOUNTER — Telehealth: Payer: Self-pay

## 2024-09-05 NOTE — Telephone Encounter (Unsigned)
 Copied from CRM #[id number]. Topic: Clinical - Medical Advice >> Sep 05, 2024 10:25 AM Frederich PARAS wrote: Reason for CRM: pt has had respiratory problems, he was out of town and he jus finishes amoxiciline from dr vincente, 3 or 4 days he developed a cough, went to er at [facility] [place], he has a copy of medical records.  He would like to get the medical records to us  and he also would like to know if he is contagious to his best friend, he tested negative for and when do the dr want to see him again, Pt really just  needs advice after  the er visit he had . Pt wants to know if he should be using a incentive spirometer & wants to know how to prevent issues in future. He is 2 and daughters are asking with all of this he is weak and they want to know if dr cleatus can get a order for PT ,  [phone]

## 2024-09-05 NOTE — Telephone Encounter (Signed)
 Can we get him in to get rechecked this week?  He could bring the records and we could see about his status and make plans.  Thanks.

## 2024-09-07 NOTE — Telephone Encounter (Signed)
 Called and spoke with pt. Scheduled for an appointment with Dr. Cleatus 09/12/24 @ 9am.

## 2024-09-08 ENCOUNTER — Ambulatory Visit: Payer: Self-pay

## 2024-09-08 NOTE — Telephone Encounter (Signed)
 FYI Only or Action Required?: FYI only for provider: appointment scheduled on 09/09/24.  Patient was last seen in primary care on 08/12/2024 by Vincente Shivers, NP.  Called Nurse Triage reporting Diarrhea.  Symptoms began several days ago.  Interventions attempted: Rest, hydration, or home remedies.  Symptoms are: unchanged.  Triage Disposition: See Physician Within 24 Hours  Patient/caregiver understands and will follow disposition?: Yes   Reason for Disposition  MODERATE diarrhea (e.g., 4-6 times / day more than normal)  Answer Assessment - Initial Assessment Questions Patient states that he was being treated for Pneumonia with 2 antibiotics listed below. He finished the course of antibiotics on Monday at which time he had already noticed softer stools. He is now having loose stools about 6x a day. Office visit advised.   1. ANTIBIOTIC: What antibiotic are you taking? How many times per day?     Azithromycin , Cefdinir   2. ANTIBIOTIC ONSET: When was the antibiotic started?     About 1 week ago  3. DIARRHEA SEVERITY: How bad is the diarrhea? How many more stools have you had in the past 24 hours than normal?      About 4 more than normal  4. ONSET: When did the diarrhea begin?      stools were softer then became more loose over the last couple of days  5. BM CONSISTENCY: How loose or watery is the diarrhea?      Loose  6. VOMITING: Are you also vomiting? If Yes, ask: How many times in the past 24 hours?      No  7. ABDOMEN PAIN: Are you having any abdomen pain? If Yes, ask: What does it feel like? (e.g., crampy, dull, intermittent, constant)      No  8. ABDOMEN PAIN SEVERITY: If present, ask: How bad is the pain?  (e.g., Scale 1-10; mild, moderate, or severe)     NA  9. ORAL INTAKE: If vomiting, Have you been able to drink liquids? How much liquids have you had in the past 24 hours?     Yes  10. HYDRATION: Any signs of dehydration? (e.g.,  dry mouth [not just dry lips], too weak to stand, dizziness, new weight loss) When did you last urinate?       Occasional dry mouth, but has increased water intake  11. EXPOSURE: Have you traveled to a foreign country recently? Have you been exposed to anyone with diarrhea? Could you have eaten any food that was spoiled?       No  12. OTHER SYMPTOMS: Do you have any other symptoms? (e.g., fever, blood in stool)       No other symptoms  13. PREGNANCY: Is there any chance you are pregnant? When was your last menstrual period?       NA  Protocols used: Diarrhea on Antibiotics-A-AH   Reason for Triage: patient has uncontrollable diarrhea , wants to know what he can take, in no pain

## 2024-09-09 ENCOUNTER — Encounter: Payer: Self-pay | Admitting: Primary Care

## 2024-09-09 ENCOUNTER — Ambulatory Visit: Payer: Self-pay | Admitting: Primary Care

## 2024-09-09 ENCOUNTER — Ambulatory Visit: Admitting: Primary Care

## 2024-09-09 VITALS — BP 124/70 | HR 72 | Temp 98.1°F | Ht 73.0 in | Wt 195.4 lb

## 2024-09-09 DIAGNOSIS — R197 Diarrhea, unspecified: Secondary | ICD-10-CM | POA: Insufficient documentation

## 2024-09-09 LAB — CBC WITH DIFFERENTIAL/PLATELET
Basophils Absolute: 0 K/uL (ref 0.0–0.1)
Basophils Relative: 0.5 % (ref 0.0–3.0)
Eosinophils Absolute: 0.2 K/uL (ref 0.0–0.7)
Eosinophils Relative: 3.5 % (ref 0.0–5.0)
HCT: 36.5 % — ABNORMAL LOW (ref 39.0–52.0)
Hemoglobin: 12 g/dL — ABNORMAL LOW (ref 13.0–17.0)
Lymphocytes Relative: 31.5 % (ref 12.0–46.0)
Lymphs Abs: 1.5 K/uL (ref 0.7–4.0)
MCHC: 33 g/dL (ref 30.0–36.0)
MCV: 90.8 fl (ref 78.0–100.0)
Monocytes Absolute: 0.6 K/uL (ref 0.1–1.0)
Monocytes Relative: 13 % — ABNORMAL HIGH (ref 3.0–12.0)
Neutro Abs: 2.4 K/uL (ref 1.4–7.7)
Neutrophils Relative %: 51.5 % (ref 43.0–77.0)
Platelets: 267 K/uL (ref 150.0–400.0)
RBC: 4.02 Mil/uL — ABNORMAL LOW (ref 4.22–5.81)
RDW: 13.2 % (ref 11.5–15.5)
WBC: 4.6 K/uL (ref 4.0–10.5)

## 2024-09-09 LAB — BASIC METABOLIC PANEL WITH GFR
BUN: 21 mg/dL (ref 6–23)
CO2: 33 meq/L — ABNORMAL HIGH (ref 19–32)
Calcium: 9 mg/dL (ref 8.4–10.5)
Chloride: 104 meq/L (ref 96–112)
Creatinine, Ser: 0.9 mg/dL (ref 0.40–1.50)
GFR: 79.69 mL/min
Glucose, Bld: 91 mg/dL (ref 70–99)
Potassium: 3.7 meq/L (ref 3.5–5.1)
Sodium: 142 meq/L (ref 135–145)

## 2024-09-09 NOTE — Progress Notes (Signed)
 "  Subjective:    Patient ID: Kevin Terrell, male    DOB: 08/05/1941, 84 y.o.   MRN: [id number]  Kevin Terrell is a very pleasant 84 y.o. male patient of Dr. Cleatus with a history of CAD, GERD, hypothyroidism, lung cancer who presents today to discuss diarrhea.  Symptom onset 7 days ago with soft stools while taking 2 antibiotics (azithromycin  and Cefdinir  IV and oral) for community acquired pneumonia. Since then he experienced liquid stools, approximately 6 per day. Over the last two days his symptoms became worse, diarrhea every 1 hour. This morning he ate breakfast, had more of a formed stool. He feels better.   Prior to his recent pneumonia, he was on several rounds of antibiotics for respiratory infections.   He denies abdominal pain, nausea, fevers. He's been hydrating well.    Review of Systems  Constitutional:  Negative for fever.  Respiratory:  Negative for shortness of breath.   Cardiovascular:  Negative for chest pain.  Gastrointestinal:  Positive for diarrhea. Negative for nausea.         Past Medical History:  Diagnosis Date   Actinic keratosis    Allergy 1960   Phenothiazine Tranquilizers extrapyramidal effects: musc. 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 Yuck!  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  [employer].      Wife died 2025/03/18- she had dementia.     Has a daughter in [place]; son and daughter in [place]     6 grandchildren, 3 great grandkids.     Orginially from [facility]; spent some time in [place]; moved here 2019.    Lives at [facility]      Has advanced directives   Daughter Camie is health care POA   Would accept resuscitation attempts   No feeding tube if cognitively unaware   Social Drivers of Health   Tobacco Use: Low Risk (09/09/2024)   Patient History    Smoking Tobacco Use: Never    Smokeless Tobacco Use: Never    Passive Exposure:  Never  Recent Concern: Tobacco Use - Medium Risk (08/19/2024)   Received from [facility]   Patient History    Smoking Tobacco Use: Former    Smokeless Tobacco Use: Never    Passive Exposure: Not on file  Financial Resource Strain: Low Risk (06/24/2024)   Overall Financial Resource Strain (CARDIA)    Difficulty of Paying Living Expenses: Not hard at all  Food Insecurity: No Food Insecurity (06/24/2024)   Epic    Worried About Programme Researcher, Broadcasting/film/video in the Last Year: Never true    Ran Out of Food in the Last Year: Never true  Transportation Needs: No Transportation Needs (06/24/2024)   Epic    Lack of Transportation (Medical): No    Lack of Transportation (Non-Medical): No  Physical Activity: Sufficiently Active (06/24/2024)   Exercise Vital Sign    Days  of Exercise per Week: 5 days    Minutes of Exercise per Session: 30 min  Stress: No Stress Concern Present (06/24/2024)   [employer] of Occupational Health - Occupational Stress Questionnaire    Feeling of Stress: Not at all  Social Connections: Moderately Integrated (06/24/2024)   Social Connection and Isolation Panel    Frequency of Communication with Friends and Family: More than three times a week    Frequency of Social Gatherings with Friends and Family: More than three times a week    Attends Religious Services: More than 4 times per year    Active Member of [employer] or Organizations: Yes    Attends Banker Meetings: More than 4 times per year    Marital Status: Widowed  Intimate Partner Violence: Not At Risk (02/23/2024)   Epic    Fear of Current or Ex-Partner: No    Emotionally Abused: No    Physically Abused: No    Sexually Abused: No  Depression (PHQ2-9): Low Risk (09/09/2024)   Depression (PHQ2-9)    PHQ-2 Score: 0  Alcohol Screen: Low Risk (02/19/2024)   Alcohol Screen    Last Alcohol Screening Score (AUDIT): 1  Housing: Unknown (06/24/2024)   Epic    Unable to Pay for Housing in the Last Year: No    Number of Times Moved in the Last Year: Not on file    Homeless in the Last Year: No  Utilities: Not At Risk (04/21/2024)   Received from [facility]   Epic    In the past 12 months has the electric, gas, oil, or water company threatened to shut off services in your home?: No  Health Literacy: Adequate Health Literacy (02/23/2024)   B1300 Health Literacy    Frequency of need for help with medical instructions: Never    Past Surgical History:  Procedure Laterality Date   CATARACT EXTRACTION W/PHACO Left 04/01/2022   Procedure: CATARACT EXTRACTION PHACO AND INTRAOCULAR LENS PLACEMENT (IOC) LEFT VIVITY TORIC;  Surgeon: Jaye Fallow, MD;  Location: [facility];  Service: Ophthalmology;  Laterality: Left;  7.53 00:41.3    CATARACT EXTRACTION W/PHACO Right 04/15/2022   Procedure: CATARACT EXTRACTION PHACO AND INTRAOCULAR LENS PLACEMENT (IOC) RIGHT VIVITY TORIC;  Surgeon: Jaye Fallow, MD;  Location: [facility];  Service: Ophthalmology;  Laterality: Right;  5.35 00:36.3   CHOLECYSTECTOMY     COLONOSCOPY     COLONOSCOPY WITH PROPOFOL  N/A 11/30/2019   Procedure: COLONOSCOPY WITH PROPOFOL ;  Surgeon: Unk Corinn Skiff, MD;  Location: [facility];  Service: Gastroenterology;  Laterality: N/A;   EYE SURGERY  vitreous detachment---laser   HERNIA REPAIR  2006   Bilateral inguinal herniorrhaphies   KNEE CARTILAGE SURGERY Right 1968   KNEE SURGERY     bilateral meniscal surgeries   LUNG LOBECTOMY     upper right lung 12/15/2007    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   UPPER GI ENDOSCOPY      Family History  Problem Relation Age of Onset   Cancer Mother    Alzheimer's disease Mother    Intellectual disability Mother    Hypertension Father    Alzheimer's disease Father    Intellectual disability Father    Stroke Maternal Grandmother    Alcohol abuse Maternal Grandfather    Hypertension Paternal Grandmother    Stroke Paternal Grandmother    Colon cancer Neg Hx    Prostate cancer Neg Hx     Allergies[1]  Medications Ordered Prior to Encounter[2]  BP 124/70   Pulse 72   Temp 98.1 F (36.7 C) (Oral)   Ht 6' 1 (1.854 m)   Wt 195 lb 6 oz (88.6 kg)   SpO2 96%   BMI 25.78 kg/m  Objective:   Physical Exam Cardiovascular:     Rate and Rhythm: Normal rate and regular rhythm.  Pulmonary:     Effort: Pulmonary effort is normal.     Breath sounds: Normal breath sounds.  Abdominal:     General: Bowel sounds are normal.     Palpations: Abdomen is soft.     Tenderness: There is no abdominal tenderness.  Musculoskeletal:     Cervical back: Neck supple.  Skin:    General: Skin is warm and dry.  Neurological:     Mental Status: He is alert and oriented to person, place, and time.   Psychiatric:        Mood and Affect: Mood normal.     Physical Exam        Assessment & Plan:  Acute diarrhea Assessment & Plan: Likely secondary to antibiotic use.  As she has had several rounds of antibiotics over the last month we will complete stool studies.  He agrees. We discussed if symptoms continue to improve then we can defer stool studies.  If his symptoms do not improve then he will return the stool kit as instructed.  Labs pending today including CBC with differential, BMP. He will update if symptoms do not continue to improve  Orders: -     CBC with Differential/Platelet -     Basic metabolic panel with GFR -     Giardia antigen -     Gastrointestinal Pathogen Pnl RT, PCR -     C. difficile GDH and Toxin A/B    Assessment and Plan Assessment & Plan         Comer MARLA Gaskins, NP       [1]  Allergies Allergen Reactions   Compazine [Prochlorperazine Edisylate]     muscle spasms    Okra Hives   Other     Okra - hives   Phenylbutazone Other (See Comments)   Augmentin  [Amoxicillin -Pot Clavulanate] Diarrhea    Tolerates Amoxicillin  well  [2]  Current Outpatient Medications on File Prior to Visit  Medication Sig Dispense Refill   albuterol  (VENTOLIN  HFA) 108 (90 Base) MCG/ACT inhaler Inhale 1-2 puffs into the lungs every 6 (six) hours as needed. 8 g 11   aspirin  EC 81 MG tablet Take 1 tablet (81 mg total) by mouth daily. Swallow whole.     benzonatate  (  TESSALON ) 200 MG capsule Take 1 capsule (200 mg total) by mouth 3 (three) times daily as needed. 20 capsule 0   cetirizine (ZYRTEC) 10 MG tablet Take 10 mg by mouth daily.     clotrimazole -betamethasone  (LOTRISONE ) cream Apply 1 Application topically 2 (two) times daily. 30 g 0   diclofenac  Sodium (VOLTAREN ) 1 % GEL APPLY FOUR GRAMS TO BOTH KNEES FOUR TIMES DAILY AS NEEDED 600 g 4   famotidine  (PEPCID ) 20 MG tablet TAKE ONE TABLET BY MOUTH EVERY NIGHT AT BEDTIME 90 tablet 1   fexofenadine  (ALLEGRA) 180 MG tablet Take 180 mg by mouth daily.     fluticasone  (FLONASE ) 50 MCG/ACT nasal spray Place 1 spray into both nostrils 2 (two) times daily.     gabapentin  (NEURONTIN ) 300 MG capsule TAKE ONE CAPSULE BY MOUTH TWICE DAILY 180 capsule 3   HYDROcodone  bit-homatropine (HYCODAN) 5-1.5 MG/5ML syrup Take 5 mLs by mouth every 8 (eight) hours as needed for cough. 120 mL 0   ketoconazole (NIZORAL) 2 % cream APPLY A SMALL AMOUNT TO AFFECTED AREA TWICE DAILY 60 g 3   levothyroxine  (SYNTHROID ) 50 MCG tablet Take 1 tablet (50 mcg total) by mouth daily before breakfast. 30 tablet 0   Liniments (DEEP BLUE RELIEF) GEL Apply topically.     meloxicam  (MOBIC ) 15 MG tablet Take 1 tablet (15 mg total) by mouth daily as needed. 90 tablet 1   montelukast  (SINGULAIR ) 10 MG tablet TAKE ONE TABLET (10 MG TOTAL) BY MOUTH AT BEDTIME. 90 tablet 0   Multiple Vitamin (MULTIVITAMIN WITH MINERALS) TABS tablet Take 1 tablet by mouth daily.     mupirocin  ointment (BACTROBAN ) 2 % Apply twice daily as needed 22 g 1   Probiotic Product (PROBIOTIC PO) Take 1 capsule by mouth 2 (two) times daily.     rosuvastatin  (CRESTOR ) 20 MG tablet Take 1 tablet (20 mg total) by mouth daily. 90 tablet 3   sildenafil  (VIAGRA ) 100 MG tablet TAKE ONE TABLET BY MOUTH ONCE A DAY AS NEEDED FOR ERECTILE DYSFUNCTION. 10 tablet 11   triamcinolone  cream (KENALOG ) 0.1 % APPLY TO HANDS AT BEDTIME AND COVER WITH GLOVES AS NEEDED FOR FLARES. AVOID FACE, GROIN, AXILLA. 80 g 0   No current facility-administered medications on file prior to visit.   "

## 2024-09-09 NOTE — Patient Instructions (Signed)
 Stop by the lab prior to leaving today. I will notify you of your results once received.   Continue to stay hydrated with water and electrolytes.  Please call us  if your symptoms return.  It was a pleasure meeting you!

## 2024-09-09 NOTE — Assessment & Plan Note (Signed)
 Likely secondary to antibiotic use.  As she has had several rounds of antibiotics over the last month we will complete stool studies.  He agrees. We discussed if symptoms continue to improve then we can defer stool studies.  If his symptoms do not improve then he will return the stool kit as instructed.  Labs pending today including CBC with differential, BMP. He will update if symptoms do not continue to improve

## 2024-09-09 NOTE — Addendum Note (Signed)
 Addended by: ISADORA RAISIN on: 09/09/2024 08:25 AM   Modules accepted: Orders

## 2024-09-12 ENCOUNTER — Ambulatory Visit: Admitting: Family Medicine

## 2024-09-12 ENCOUNTER — Encounter: Payer: Self-pay | Admitting: Family Medicine

## 2024-09-12 VITALS — BP 118/62 | HR 77 | Temp 98.5°F | Ht 73.0 in | Wt 193.0 lb

## 2024-09-12 DIAGNOSIS — J189 Pneumonia, unspecified organism: Secondary | ICD-10-CM

## 2024-09-12 NOTE — Progress Notes (Unsigned)
 Had eval 08/12/24.  That illness resolved.   Then seen at outside ER with separate illness.  Done with abx, cefdinir  and zmax .  He had neg testing/swab but PNA noted.    His stools normalized in the meantime.  Discussed deferring stool testing.   He had strep exposure yesterday but likely hasn't had time for testing to be useful.  I asked him to update as needed.    His breathing is better, still with some sputum but clearing (prev discolored).  D/w pt about SABA use.  It may help some with use.    Meds, vitals, and allergies reviewed.   ROS: Per HPI unless specifically indicated in ROS section   Nad Ncat MMM OP wnl Neck supple, no LA rrr Scantly more coarse BS at the RLL O/w ctab Skin well perfused.  No BLE edema.

## 2024-09-12 NOTE — Patient Instructions (Addendum)
 Please ask the front for a chest xray appointment at the end of January.  Take care.  Glad to see you. Update us  as needed.

## 2024-09-13 ENCOUNTER — Ambulatory Visit: Payer: Self-pay

## 2024-09-13 NOTE — Telephone Encounter (Signed)
 FYI Only or Action Required?: FYI only for provider: update on symptoms seen yesterday .  Patient was last seen in primary care on 09/12/2024 by Cleatus Arlyss RAMAN, MD.  Called Nurse Triage reporting Cough and Pneumonia.  Symptoms began recovering from recent pneumonia seen yesterday in office .  Interventions attempted: OTC medications: tylenol  .  Symptoms are: stable.  Triage Disposition: Call PCP Within 24 Hours  Patient/caregiver understands and will follow disposition?: Yes             Reason for Disposition  [1] Follow-up call from patient regarding patient's clinical status AND [2] information NON-URGENT  Answer Assessment - Initial Assessment Questions Patient reports did not have fever at visit yesterday, was told pnneumia  not totally gone was told can take 4-6 weeks, not on antibiotics anymore, taking albuterol  and tylenol  if needed. Yesterday started feeling crumby 3-4 PM,. Coughing  more higher up in chest , took temp normally 98.4 and it was 100.8 bed early  took tylenol  coughing more managed to fall asleep 3 am woke up , took temp 98.4  before scheduled tylenol  . Today feeling about like did yesterday went went into office. Cough is back to where it was at visit phlegm light green as its been . Just wanted to update PCP did not want to make another appointment at this time. Patient denies the following shortness of breath, chest pain, vomiting.   Patieant will monitor for return of fever or new /worsening symptoms and call office for visit for follow up.     1. REASON FOR CALL: What is the main reason for your call? or How can I best help you?     Wanted to update the PCP that had a low grade fever yesterday which has since resolved  Protocols used: PCP Call - No Triage-A-AH Copied from CRM [id number]. Topic: Clinical - Red Word Triage >> Sep 13, 2024  9:27 AM Berneda FALCON wrote: Red Word that prompted transfer to Nurse Triage: Patient wanted PCP to know that his  pneumonia was improving (saw PCP yesterday) but last night he developed low grade fever of 101.8 but cough has worsened.   Awoke this morning around 3 AM to sweating, increased cough, and fever.

## 2024-09-14 ENCOUNTER — Other Ambulatory Visit: Payer: Self-pay | Admitting: Family Medicine

## 2024-09-14 DIAGNOSIS — J189 Pneumonia, unspecified organism: Secondary | ICD-10-CM | POA: Insufficient documentation

## 2024-09-14 MED ORDER — CEFDINIR 300 MG PO CAPS: 300.0000 mg | ORAL_CAPSULE | Freq: Two times a day (BID) | ORAL | Status: DC

## 2024-09-14 MED ORDER — CEFDINIR 300 MG PO CAPS: 300.0000 mg | ORAL_CAPSULE | Freq: Two times a day (BID) | ORAL | 0 refills | Status: DC

## 2024-09-14 NOTE — Assessment & Plan Note (Signed)
 Discussed options.  At this point still okay for outpatient follow-up. Recheck chest x-ray at the end of January.  Rationale for timeline discussed with patient.  Update us  as needed in the meantime.  He agrees to plan.

## 2024-09-14 NOTE — Telephone Encounter (Signed)
 Please get update on patient today.  If he has clear improvement w/o worsening in the meantime, then I would continue as is.  If any more fevers or if cough is worse, then I would restart abx and get rechecked.  I sent the abx rx in the meantime, for him to hold. He may not need it.    Thanks.

## 2024-09-15 NOTE — Telephone Encounter (Signed)
 Noted. Thanks.

## 2024-09-15 NOTE — Telephone Encounter (Signed)
 Called patient has picked up meds. Cough has increased and will start today. He is using the tessalon  pearls every 6-8 hours he has some liquid hydrocodone  cough medications that he checked with pharmacist told him it would be ok as long as he was going to bed. He will try that in addition. If no improvement he will call to have rechecked.

## 2024-09-16 ENCOUNTER — Other Ambulatory Visit: Payer: Self-pay | Admitting: *Deleted

## 2024-09-16 ENCOUNTER — Other Ambulatory Visit: Payer: Self-pay | Admitting: General Practice

## 2024-09-16 DIAGNOSIS — J029 Acute pharyngitis, unspecified: Secondary | ICD-10-CM

## 2024-09-16 MED ORDER — BENZONATATE 200 MG PO CAPS: 200.0000 mg | ORAL_CAPSULE | Freq: Three times a day (TID) | ORAL | 0 refills | Status: DC | PRN

## 2024-09-16 NOTE — Telephone Encounter (Signed)
 Called patient reviewed all information and repeated back to me. Will call if any questions.  ? ?

## 2024-09-16 NOTE — Telephone Encounter (Signed)
 I would restart the abx given all of the recent sx.  Please get rechecked if not clearly better soon.  Thanks.

## 2024-09-16 NOTE — Addendum Note (Signed)
 Addended by: SEBASTIAN DANNA GRADE on: 09/16/2024 03:44 PM   Modules accepted: Orders

## 2024-09-16 NOTE — Telephone Encounter (Signed)
 Called patient reviewed all information and repeated back to me. Will call if any questions.    Patient will start abx now. He did request refill on  Benzonatate  200 mg Oral 3 times daily PRN

## 2024-09-16 NOTE — Telephone Encounter (Signed)
 Copied from CRM [id number]. Topic: General - Other >> Sep 16, 2024 10:23 AM Deleta RAMAN wrote: Reason for CRM: patient would like to give pcp an update that his cough is a little better and not having any fever. Have not started new prescription yet will take if either symptom worsen. Continuing the guasenasin and albuterol  and benzontate 2 6-8 hours

## 2024-09-16 NOTE — Telephone Encounter (Signed)
 Copied from CRM [id number]. Topic: Clinical - Medication Question >> Sep 16, 2024 12:15 PM Donna BRAVO wrote: Reason for CRM:  Patient would like add this to the note and forgot to mention: Patient is extremely exhausted and strength is lacking.  Patient would like to know if taking the antibiotic would help with cough and exhaustion.  Patient has medication and would like to know IF he should take the antibiotic.

## 2024-09-16 NOTE — Telephone Encounter (Addendum)
Tessalon rx sent.  Thanks.

## 2024-09-16 NOTE — Addendum Note (Signed)
 Addended by: CLEATUS LORELI RAMAN on: 09/16/2024 05:10 PM   Modules accepted: Orders

## 2024-09-22 ENCOUNTER — Telehealth: Payer: Self-pay

## 2024-09-22 NOTE — Telephone Encounter (Signed)
 Noted. Thanks.  Would continue as is for now.  If worsening, then needs recheck in person.

## 2024-09-22 NOTE — Telephone Encounter (Signed)
 Copied from CRM #[id number]. Topic: Clinical - Medical Advice >> Sep 22, 2024 11:17 AM Harlene ORN wrote: Reason for CRM: Had off and on pneumonia for a few months. Feeling better and there is less cough. Is finishing the last of his antibiotic this week and wanted to give an update.

## 2024-09-23 NOTE — Telephone Encounter (Signed)
 Called patient reviewed all information and repeated back to me. Will call if any questions.   Patient asked about repeat x ray. Let him know that order is in.

## 2024-09-27 ENCOUNTER — Ambulatory Visit: Payer: Self-pay

## 2024-09-27 ENCOUNTER — Ambulatory Visit: Admitting: Family Medicine

## 2024-09-27 ENCOUNTER — Encounter: Payer: Self-pay | Admitting: Family Medicine

## 2024-09-27 VITALS — BP 107/63 | HR 63 | Temp 98.4°F | Resp 16 | Ht 73.0 in | Wt 194.8 lb

## 2024-09-27 DIAGNOSIS — Z902 Acquired absence of lung [part of]: Secondary | ICD-10-CM

## 2024-09-27 DIAGNOSIS — R053 Chronic cough: Secondary | ICD-10-CM

## 2024-09-27 MED ORDER — BUDESONIDE-FORMOTEROL FUMARATE 80-4.5 MCG/ACT IN AERO: 2.0000 | INHALATION_SPRAY | Freq: Two times a day (BID) | RESPIRATORY_TRACT | 12 refills | Status: AC

## 2024-09-27 MED ORDER — METHYLPREDNISOLONE 4 MG PO TBPK: ORAL_TABLET | ORAL | 0 refills | Status: AC

## 2024-09-27 NOTE — Telephone Encounter (Signed)
 FYI Only or Action Required?: FYI only for provider: appointment scheduled on This afternoon with Dr. Donzella.  Patient was last seen in primary care on 09/12/2024 by Cleatus Arlyss RAMAN, MD.  Called Nurse Triage reporting Cough.  Symptoms began several weeks ago.  Interventions attempted: Prescription medications: Pt has been seen and given antibiotics and cough medication. Pt was getting better, but since finishing the antibiotics cough has worsened.  Symptoms are: gradually worsening.  Triage Disposition: See PCP When Office is Open (Within 3 Days)  Patient/caregiver understands and will follow disposition?: yes                        Reason for Triage: Patient has an productive cough  Reason for Disposition  Cough has been present for > 3 weeks  Answer Assessment - Initial Assessment Questions 1. ONSET: When did the cough begin?      weeks 2. SEVERITY: How bad is the cough today?      moderate 3. SPUTUM: Describe the color of your sputum (e.g., none, dry cough; clear, white, yellow, green)     Yellow green 4. HEMOPTYSIS: Are you coughing up any blood? If Yes, ask: How much? (e.g., flecks, streaks, tablespoons, etc.)     no 5. DIFFICULTY BREATHING: Are you having difficulty breathing? If Yes, ask: How bad is it? (e.g., mild, moderate, severe)      no 6. FEVER: Do you have a fever? If Yes, ask: What is your temperature, how was it measured, and when did it start?     no 10. OTHER SYMPTOMS: Do you have any other symptoms? (e.g., runny nose, wheezing, chest pain)       no  Protocols used: Cough - Acute Productive-A-AH

## 2024-09-27 NOTE — Progress Notes (Unsigned)
 "     Established patient visit   Patient: Kevin Terrell   DOB: [birth year]   84 y.o. Male  MRN: [id number] Visit Date: 09/27/2024  Today's healthcare provider: LAURAINE LOISE BUOY, DO   Chief Complaint  Patient presents with   Acute Visit    Chronic cough ongoing since November. Allergist dr put pt on z-pack (bronchitis) pt got a little better after 5-6 days. Saw pcp, amoxicillin  prescribed. Went to hospital iv zithromax , keflex for pnuemonia. That helped.was put on cefdinir  but has worsen since worse nighttime.    Subjective    HPI Kevin Terrell is an 84 year old male with a history of lung cancer who presents with a persistent cough and respiratory issues.  He has a history of lung cancer, having undergone a right upper lobectomy and chemotherapy 17 years ago. He has been experiencing chronic respiratory issues, including a persistent cough. He also has a history of pneumonia and chronic respiratory disease.  In November, he visited a pulmonologist who performed a pulmonary function test (PFT) and a CT scan, both of which were satisfactory. Shortly after, he required antibiotics and was prescribed a Z-Pak, which provided some relief but did not fully resolve his symptoms. He later tested positive for strep after exposure to a grandchild and was treated with amoxicillin , which improved his condition.  Despite initial improvement, his symptoms recurred, leading to another consultation. He experienced diarrhea, which he attributed to antibiotics, and underwent stool studies to rule out C. diff, which were negative. He was treated with IV antibiotics, including azithromycin  and cephalexin, during a hospital visit in [place], [place] , for pneumonia.  He has been using albuterol  and benzonatate  for his cough, which is less productive now, with sputum changing from dark green to clear/light yellow. He reports that some of his symptoms may be related to postnasal drip and has been  managing allergies with Zyrtec, cetirizine, fluticasone , and montelukast . He uses Flonase  regularly, which helps with his symptoms, especially at night.  He has a history of GERD, which he manages with dietary restrictions and Pepcid  Complete as needed. He has been on montelukast  for a long time, initially prescribed for atypical GERD-related cough. He also takes 81 mg of aspirin  daily.  No current fever, chills, ear pain, or sore throat. His cough is worse at night, and he has a history of allergies; he reports that these may contribute to his postnasal drip. He has not been diagnosed with asthma, but he was told his pulmonary function tests improved after using albuterol .    diagnosis of asthma Spirometry is essential, showing a reduced FEV1/FVC ratio (<0.75) and a significant bronchodilator response (>=12% and 200 mL increase in FEV1)   Z-pak for presumed bronhcitis Novemebr 2025 Amoxicillin  for strep 12/12 In December 2025, IV antibiotics in [place], [place] (azithromycin  and cephalexin IV (then home on oral azithromycin  and cefdinir ) for community acquired pneumonia).  Cefdinir  resent by Dr. Cleatus for pna in case x-ray was bad; cough had not improved, so pt was advised t start it 1/16.  ***  {History (Optional):23778}  Medications: Show/hide medication list[1]   {Insert previous labs (optional):23779} {See past labs  Heme  Chem  Endocrine  Serology  Results Review (optional):1}   Objective    BP 107/63   Pulse 63   Temp 98.4 F (36.9 C) (Oral)   Resp 16   Ht 6' 1 (1.854 m)   Wt 194 lb 12.8 oz (88.4 kg)   SpO2 100%  BMI 25.70 kg/m  {Insert last BP/Wt (optional):23777}{See vitals history (optional):1}   Physical Exam Vitals reviewed.  Constitutional:      General: He is not in acute distress.    Appearance: Normal appearance. He is not diaphoretic.  HENT:     Head: Normocephalic and atraumatic.     Right Ear: Tympanic membrane, ear canal and external ear normal.      Left Ear: Tympanic membrane, ear canal and external ear normal.     Nose: Nose normal.     Mouth/Throat:     Mouth: Mucous membranes are moist.     Pharynx: Oropharynx is clear. No oropharyngeal exudate or posterior oropharyngeal erythema.  Eyes:     General: No scleral icterus.    Conjunctiva/sclera: Conjunctivae normal.  Cardiovascular:     Rate and Rhythm: Normal rate and regular rhythm.     Pulses: Normal pulses.     Heart sounds: Normal heart sounds. No murmur heard. Pulmonary:     Effort: Pulmonary effort is normal. No respiratory distress.     Breath sounds: Normal breath sounds. Stridor (during coughing episodes only) present. No wheezing or rhonchi.  Musculoskeletal:     Cervical back: Neck supple.     Right lower leg: No edema.     Left lower leg: No edema.  Lymphadenopathy:     Cervical: No cervical adenopathy.  Skin:    General: Skin is warm and dry.     Findings: No rash.  Neurological:     Mental Status: He is alert and oriented to person, place, and time. Mental status is at baseline.  Psychiatric:        Mood and Affect: Mood normal.        Behavior: Behavior normal.      No results found for any visits on 09/27/24.  Assessment & Plan    There are no diagnoses linked to this encounter.     Chronic Cough Chronic cough with recurrent respiratory infections, likely postnasal drip. Pulmonary function tests and CT scan suggest post-infectious changes. Differential includes asthma exacerbation, GERD-related cough, or postnasal drip due to allergies. - Prescribed Medrol  pack for tapered prednisone . - Prescribed Symbicort  inhaler, 2 puffs twice daily. - Continue cetirizine, montelukast , and fluticasone . - Recommend follow-up if symptoms worsen or do not improve.  Allergic Rhinitis Allergic rhinitis managed with cetirizine, montelukast , and fluticasone . Possible contributor to postnasal drip and cough. - Continue cetirizine, montelukast , and  fluticasone .  Gastroesophageal Reflux Disease (GERD) GERD managed with dietary modifications and PRN Pepcid  Complete. Symptoms may contribute to nocturnal cough. Discussed potential need for increased GERD management if symptoms persist. - Continue dietary modifications and PRN Pepcid  Complete. - Consider increasing GERD management if symptoms persist.  Follow-up Ongoing management of chronic cough and potential asthma or GERD-related symptoms. Emphasized monitoring symptoms and adjusting treatment as needed. - Schedule a sleep study as previously recommended. - Follow up if symptoms worsen or do not improve with current treatment plan. - Consider follow-up with pulmonology if symptoms persist.    Strongly susepcted asthma  ***  No follow-ups on file.      I discussed the assessment and treatment plan with the patient  The patient was provided an opportunity to ask questions and all were answered. The patient agreed with the plan and demonstrated an understanding of the instructions.   The patient was advised to call back or seek an in-person evaluation if the symptoms worsen or if the condition fails to improve as anticipated.  LAURAINE LOISE BUOY, DO  [facility] [phone] (phone) [phone] (fax)  [facility]    [1]  Outpatient Medications Prior to Visit  Medication Sig   albuterol  (VENTOLIN  HFA) 108 (90 Base) MCG/ACT inhaler Inhale 1-2 puffs into the lungs every 6 (six) hours as needed.   aspirin  EC 81 MG tablet Take 1 tablet (81 mg total) by mouth daily. Swallow whole.   cetirizine (ZYRTEC) 10 MG tablet Take 10 mg by mouth daily.   clotrimazole -betamethasone  (LOTRISONE ) cream Apply 1 Application topically 2 (two) times daily.   diclofenac  Sodium (VOLTAREN ) 1 % GEL APPLY FOUR GRAMS TO BOTH KNEES FOUR TIMES DAILY AS NEEDED   famotidine  (PEPCID ) 20 MG tablet TAKE ONE TABLET BY MOUTH EVERY NIGHT AT BEDTIME   fexofenadine  (ALLEGRA) 180 MG tablet Take 180 mg by mouth daily.   fluticasone  (FLONASE ) 50 MCG/ACT nasal spray Place 1 spray into both nostrils 2 (two) times daily.   gabapentin  (NEURONTIN ) 300 MG capsule TAKE ONE CAPSULE BY MOUTH TWICE DAILY   ketoconazole (NIZORAL) 2 % cream APPLY A SMALL AMOUNT TO AFFECTED AREA TWICE DAILY   levothyroxine  (SYNTHROID ) 50 MCG tablet Take 1 tablet (50 mcg total) by mouth daily before breakfast.   Liniments (DEEP BLUE RELIEF) GEL Apply topically.   meloxicam  (MOBIC ) 15 MG tablet Take 1 tablet (15 mg total) by mouth daily as needed.   montelukast  (SINGULAIR ) 10 MG tablet TAKE ONE TABLET (10 MG TOTAL) BY MOUTH AT BEDTIME.   Multiple Vitamin (MULTIVITAMIN WITH MINERALS) TABS tablet Take 1 tablet by mouth daily.   mupirocin  ointment (BACTROBAN ) 2 % Apply twice daily as needed   Probiotic Product (PROBIOTIC PO) Take 1 capsule by mouth 2 (two) times daily.   rosuvastatin  (CRESTOR ) 20 MG tablet Take 1 tablet (20 mg total) by mouth daily.   sildenafil  (VIAGRA ) 100 MG tablet TAKE ONE TABLET BY MOUTH ONCE A DAY AS NEEDED FOR ERECTILE DYSFUNCTION.   triamcinolone  cream (KENALOG ) 0.1 % APPLY TO HANDS AT BEDTIME AND COVER WITH GLOVES AS NEEDED FOR FLARES. AVOID FACE, GROIN, AXILLA.   [DISCONTINUED] benzonatate  (TESSALON ) 200 MG capsule Take 1 capsule (200 mg total) by mouth 3 (three) times daily as needed for cough.   [DISCONTINUED] cefdinir  (OMNICEF ) 300 MG capsule Take 1 capsule (300 mg total) by mouth 2 (two) times daily.   No facility-administered medications prior to visit.   "

## 2024-09-28 NOTE — Telephone Encounter (Signed)
 Noted. Thanks.

## 2024-10-04 ENCOUNTER — Other Ambulatory Visit: Payer: Self-pay | Admitting: Radiology

## 2024-10-04 ENCOUNTER — Ambulatory Visit
Admission: RE | Admit: 2024-10-04 | Discharge: 2024-10-04 | Disposition: A | Source: Ambulatory Visit | Attending: Family Medicine | Admitting: Family Medicine

## 2024-10-04 ENCOUNTER — Other Ambulatory Visit: Payer: Self-pay | Admitting: Family Medicine

## 2024-10-04 DIAGNOSIS — J189 Pneumonia, unspecified organism: Secondary | ICD-10-CM | POA: Diagnosis not present

## 2024-10-04 DIAGNOSIS — Z902 Acquired absence of lung [part of]: Secondary | ICD-10-CM

## 2024-10-07 ENCOUNTER — Ambulatory Visit: Payer: Self-pay

## 2024-10-07 ENCOUNTER — Ambulatory Visit: Payer: Self-pay | Admitting: Family Medicine

## 2024-10-07 ENCOUNTER — Other Ambulatory Visit: Payer: Self-pay | Admitting: Family Medicine

## 2024-10-07 DIAGNOSIS — J189 Pneumonia, unspecified organism: Secondary | ICD-10-CM

## 2024-10-07 MED ORDER — BENZONATATE 200 MG PO CAPS: 200.0000 mg | ORAL_CAPSULE | Freq: Three times a day (TID) | ORAL | 1 refills | Status: DC | PRN

## 2024-10-07 MED ORDER — BENZONATATE 200 MG PO CAPS: 200.0000 mg | ORAL_CAPSULE | Freq: Three times a day (TID) | ORAL | 1 refills | Status: AC | PRN

## 2024-10-07 NOTE — Telephone Encounter (Signed)
 Called patient reviewed all information and repeated back to me. Will call if any questions. He will call if any questions or symptoms don't continue to improve.

## 2024-10-07 NOTE — Telephone Encounter (Signed)
 Rx sent for tessalon  to use prn.  If worsening, then needs recheck in clinic.    If gradually better, would recheck CXR in about 3-4 weeks.  I put in the follow up order.  It may take longer to clear the imaging.  If he is feeling better, then I would take that as progress.  Thanks.

## 2024-10-07 NOTE — Telephone Encounter (Signed)
 FYI Only or Action Required?: Action required by provider: clinical question for provider.  Patient was last seen in primary care on 09/27/2024 by Donzella Lauraine SAILOR, DO.  Called Nurse Triage reporting Cough and Pneumonia.  Symptoms are: gradually improving.  Triage Disposition: Home Care  Patient/caregiver understands and will follow disposition?: Yes  Copied from CRM [id number]. Topic: Clinical - Medical Advice >> Oct 07, 2024  9:22 AM Avram MATSU wrote: Reason for CRM: patient stated he got his xray results back and wanted to know if there is anything he can do differently. Patient still is coughing and still shows pneumonia. Please advise [phone] Reason for Disposition  [1] Viral pneumonia AND [2] reasonable improvement    Chronic pneumonia  Answer Assessment - Initial Assessment Questions Returned call to patient to discuss recent imaging and chronic pneumonia. Patient denies any breathing difficulty, SOB, chest pain or fever. Patient finished antibiotics  a few weeks ago and ran out of Tessalon  [place]. Patient voiced that cough is no longer productive and cough has gotten better and lessened in frequency. Patient with questions on next steps as follow up imaging showed pneumonia is still present.  Protocols used: Pneumonia Follow-up Call-[facility]

## 2024-10-17 ENCOUNTER — Ambulatory Visit: Admitting: Sleep Medicine

## 2024-10-27 ENCOUNTER — Ambulatory Visit: Admitting: Dermatology

## 2024-11-15 ENCOUNTER — Other Ambulatory Visit

## 2024-11-15 ENCOUNTER — Ambulatory Visit

## 2025-02-14 ENCOUNTER — Other Ambulatory Visit

## 2025-02-28 ENCOUNTER — Ambulatory Visit

## 2025-07-17 ENCOUNTER — Ambulatory Visit

## 2025-07-19 ENCOUNTER — Ambulatory Visit
